# Patient Record
Sex: Female | Born: 1965 | Race: White | Hispanic: No | Marital: Single | Smoking: Former smoker
Health system: Southern US, Community
[De-identification: ages and names within clinical notes are randomized; demographics above are authoritative.]

## PROBLEM LIST (undated history)

## (undated) DIAGNOSIS — F1910 Other psychoactive substance abuse, uncomplicated: Secondary | ICD-10-CM

## (undated) DIAGNOSIS — F609 Personality disorder, unspecified: Secondary | ICD-10-CM

## (undated) DIAGNOSIS — R112 Nausea with vomiting, unspecified: Secondary | ICD-10-CM

## (undated) DIAGNOSIS — S32009K Unspecified fracture of unspecified lumbar vertebra, subsequent encounter for fracture with nonunion: Secondary | ICD-10-CM

## (undated) DIAGNOSIS — F319 Bipolar disorder, unspecified: Secondary | ICD-10-CM

## (undated) DIAGNOSIS — E785 Hyperlipidemia, unspecified: Secondary | ICD-10-CM

## (undated) DIAGNOSIS — M1990 Unspecified osteoarthritis, unspecified site: Secondary | ICD-10-CM

## (undated) DIAGNOSIS — Z9289 Personal history of other medical treatment: Secondary | ICD-10-CM

## (undated) DIAGNOSIS — J45909 Unspecified asthma, uncomplicated: Secondary | ICD-10-CM

## (undated) DIAGNOSIS — F419 Anxiety disorder, unspecified: Secondary | ICD-10-CM

## (undated) DIAGNOSIS — T753XXA Motion sickness, initial encounter: Secondary | ICD-10-CM

## (undated) DIAGNOSIS — M7130 Other bursal cyst, unspecified site: Secondary | ICD-10-CM

## (undated) DIAGNOSIS — F32A Depression, unspecified: Secondary | ICD-10-CM

## (undated) DIAGNOSIS — K219 Gastro-esophageal reflux disease without esophagitis: Secondary | ICD-10-CM

## (undated) DIAGNOSIS — G4733 Obstructive sleep apnea (adult) (pediatric): Secondary | ICD-10-CM

## (undated) DIAGNOSIS — F4310 Post-traumatic stress disorder, unspecified: Secondary | ICD-10-CM

## (undated) DIAGNOSIS — Z87898 Personal history of other specified conditions: Secondary | ICD-10-CM

## (undated) DIAGNOSIS — Z9889 Other specified postprocedural states: Secondary | ICD-10-CM

## (undated) DIAGNOSIS — F329 Major depressive disorder, single episode, unspecified: Secondary | ICD-10-CM

## (undated) HISTORY — DX: Depression, unspecified: F32.A

## (undated) HISTORY — DX: Unspecified osteoarthritis, unspecified site: M19.90

## (undated) HISTORY — DX: Major depressive disorder, single episode, unspecified: F32.9

## (undated) HISTORY — DX: Hyperlipidemia, unspecified: E78.5

## (undated) HISTORY — DX: Personality disorder, unspecified: F60.9

## (undated) HISTORY — DX: Post-traumatic stress disorder, unspecified: F43.10

## (undated) HISTORY — PX: KNEE SURGERY: SHX244

## (undated) HISTORY — DX: Obstructive sleep apnea (adult) (pediatric): G47.33

## (undated) HISTORY — DX: Other psychoactive substance abuse, uncomplicated: F19.10

## (undated) HISTORY — DX: Personal history of other specified conditions: Z87.898

## (undated) HISTORY — PX: TOE SURGERY: SHX1073

## (undated) HISTORY — PX: SPINE SURGERY: SHX786

## (undated) HISTORY — DX: Anxiety disorder, unspecified: F41.9

## (undated) HISTORY — PX: SHOULDER SURGERY: SHX246

---

## 1997-05-18 ENCOUNTER — Other Ambulatory Visit: Admission: RE | Admit: 1997-05-18 | Discharge: 1997-05-18 | Payer: Self-pay | Admitting: Obstetrics and Gynecology

## 1997-07-02 ENCOUNTER — Emergency Department (HOSPITAL_COMMUNITY): Admission: EM | Admit: 1997-07-02 | Discharge: 1997-07-02 | Payer: Self-pay | Admitting: Emergency Medicine

## 1999-09-23 ENCOUNTER — Emergency Department (HOSPITAL_COMMUNITY): Admission: EM | Admit: 1999-09-23 | Discharge: 1999-09-23 | Payer: Self-pay | Admitting: Emergency Medicine

## 1999-09-23 ENCOUNTER — Encounter: Payer: Self-pay | Admitting: Emergency Medicine

## 2000-09-19 ENCOUNTER — Encounter: Admission: RE | Admit: 2000-09-19 | Discharge: 2000-09-19 | Payer: Self-pay | Admitting: Endocrinology

## 2000-09-19 ENCOUNTER — Encounter: Payer: Self-pay | Admitting: Endocrinology

## 2003-08-20 ENCOUNTER — Emergency Department (HOSPITAL_COMMUNITY): Admission: EM | Admit: 2003-08-20 | Discharge: 2003-08-20 | Payer: Self-pay | Admitting: Emergency Medicine

## 2004-04-08 ENCOUNTER — Inpatient Hospital Stay (HOSPITAL_COMMUNITY): Admission: RE | Admit: 2004-04-08 | Discharge: 2004-04-12 | Payer: Self-pay | Admitting: Psychiatry

## 2004-04-08 ENCOUNTER — Ambulatory Visit: Payer: Self-pay | Admitting: Psychiatry

## 2004-04-10 ENCOUNTER — Encounter (HOSPITAL_COMMUNITY): Payer: Self-pay | Admitting: Psychiatry

## 2004-05-20 ENCOUNTER — Encounter: Admission: RE | Admit: 2004-05-20 | Discharge: 2004-05-20 | Payer: Self-pay | Admitting: Internal Medicine

## 2005-05-10 ENCOUNTER — Emergency Department: Payer: Self-pay | Admitting: General Practice

## 2005-07-03 ENCOUNTER — Encounter: Admission: RE | Admit: 2005-07-03 | Discharge: 2005-07-03 | Payer: Self-pay | Admitting: Internal Medicine

## 2006-10-23 ENCOUNTER — Encounter: Admission: RE | Admit: 2006-10-23 | Discharge: 2006-10-23 | Payer: Self-pay | Admitting: Internal Medicine

## 2007-10-25 ENCOUNTER — Encounter: Admission: RE | Admit: 2007-10-25 | Discharge: 2007-10-25 | Payer: Self-pay | Admitting: Internal Medicine

## 2008-03-11 ENCOUNTER — Emergency Department: Payer: Self-pay

## 2008-11-15 ENCOUNTER — Encounter: Admission: RE | Admit: 2008-11-15 | Discharge: 2008-11-15 | Payer: Self-pay | Admitting: Internal Medicine

## 2008-12-05 ENCOUNTER — Encounter: Payer: Self-pay | Admitting: Neonatal-Perinatal Medicine

## 2009-01-01 ENCOUNTER — Encounter: Payer: Self-pay | Admitting: Neonatal-Perinatal Medicine
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
## 2014-02-20 ENCOUNTER — Other Ambulatory Visit: Payer: Self-pay | Admitting: Gynecology

## 2014-02-20 DIAGNOSIS — R928 Other abnormal and inconclusive findings on diagnostic imaging of breast: Secondary | ICD-10-CM

## 2014-03-06 ENCOUNTER — Ambulatory Visit
Admission: RE | Admit: 2014-03-06 | Discharge: 2014-03-06 | Disposition: A | Discharge status: Home / Self Care | Payer: Self-pay | Source: Ambulatory Visit | Attending: Gynecology | Admitting: Gynecology

## 2014-03-06 DIAGNOSIS — R928 Other abnormal and inconclusive findings on diagnostic imaging of breast: Secondary | ICD-10-CM | POA: Diagnosis not present

## 2014-03-20 DIAGNOSIS — F3181 Bipolar II disorder: Secondary | ICD-10-CM | POA: Diagnosis not present

## 2014-04-17 DIAGNOSIS — M25561 Pain in right knee: Secondary | ICD-10-CM | POA: Diagnosis not present

## 2014-04-17 DIAGNOSIS — Z79891 Long term (current) use of opiate analgesic: Secondary | ICD-10-CM | POA: Diagnosis not present

## 2014-04-17 DIAGNOSIS — M25562 Pain in left knee: Secondary | ICD-10-CM | POA: Diagnosis not present

## 2014-06-12 DIAGNOSIS — Z79891 Long term (current) use of opiate analgesic: Secondary | ICD-10-CM | POA: Diagnosis not present

## 2014-06-12 DIAGNOSIS — M25562 Pain in left knee: Secondary | ICD-10-CM | POA: Diagnosis not present

## 2014-06-12 DIAGNOSIS — M25561 Pain in right knee: Secondary | ICD-10-CM | POA: Diagnosis not present

## 2014-08-04 ENCOUNTER — Other Ambulatory Visit: Payer: Self-pay | Admitting: Gynecology

## 2014-08-04 DIAGNOSIS — N6002 Solitary cyst of left breast: Secondary | ICD-10-CM

## 2014-09-06 ENCOUNTER — Other Ambulatory Visit: Payer: Self-pay

## 2014-09-12 ENCOUNTER — Ambulatory Visit (INDEPENDENT_AMBULATORY_CARE_PROVIDER_SITE_OTHER): Payer: Medicare Other | Admitting: Internal Medicine

## 2014-09-12 ENCOUNTER — Encounter: Payer: Self-pay | Admitting: Internal Medicine

## 2014-09-12 VITALS — BP 132/84 | HR 75 | Temp 98.4°F | Ht 62.5 in | Wt 137.0 lb

## 2014-09-12 DIAGNOSIS — F319 Bipolar disorder, unspecified: Secondary | ICD-10-CM

## 2014-09-12 DIAGNOSIS — E785 Hyperlipidemia, unspecified: Secondary | ICD-10-CM | POA: Diagnosis not present

## 2014-09-12 DIAGNOSIS — F329 Major depressive disorder, single episode, unspecified: Secondary | ICD-10-CM | POA: Diagnosis not present

## 2014-09-12 DIAGNOSIS — F3131 Bipolar disorder, current episode depressed, mild: Secondary | ICD-10-CM | POA: Insufficient documentation

## 2014-09-12 DIAGNOSIS — M542 Cervicalgia: Secondary | ICD-10-CM | POA: Diagnosis not present

## 2014-09-12 DIAGNOSIS — F32A Depression, unspecified: Secondary | ICD-10-CM | POA: Insufficient documentation

## 2014-09-12 DIAGNOSIS — F3175 Bipolar disorder, in partial remission, most recent episode depressed: Secondary | ICD-10-CM | POA: Insufficient documentation

## 2014-09-12 LAB — COMPREHENSIVE METABOLIC PANEL
ALBUMIN: 4.2 g/dL (ref 3.5–5.2)
ALK PHOS: 75 U/L (ref 39–117)
ALT: 8 U/L (ref 0–35)
AST: 15 U/L (ref 0–37)
BILIRUBIN TOTAL: 0.5 mg/dL (ref 0.2–1.2)
BUN: 8 mg/dL (ref 6–23)
CALCIUM: 9.7 mg/dL (ref 8.4–10.5)
CO2: 28 mEq/L (ref 19–32)
Chloride: 98 mEq/L (ref 96–112)
Creatinine, Ser: 0.78 mg/dL (ref 0.40–1.20)
GFR: 83.15 mL/min (ref >60.00)
GLUCOSE: 93 mg/dL (ref 70–99)
Potassium: 4.2 mEq/L (ref 3.5–5.1)
SODIUM: 133 meq/L — AB (ref 135–145)
TOTAL PROTEIN: 7.2 g/dL (ref 6.0–8.3)

## 2014-09-12 LAB — CBC
HEMATOCRIT: 42.2 % (ref 36.0–46.0)
Hemoglobin: 14.2 g/dL (ref 12.0–15.0)
MCHC: 33.7 g/dL (ref 30.0–36.0)
MCV: 96 fl (ref 78.0–100.0)
PLATELETS: 409 10*3/uL — AB (ref 150.0–400.0)
RBC: 4.4 Mil/uL (ref 3.87–5.11)
RDW: 15.7 % — AB (ref 11.5–15.5)
WBC: 6.4 10*3/uL (ref 4.0–10.5)

## 2014-09-12 LAB — LIPID PANEL
CHOL/HDL RATIO: 4
Cholesterol: 201 mg/dL — ABNORMAL HIGH (ref 0–200)
HDL: 45 mg/dL (ref >39.00)
LDL Cholesterol: 121 mg/dL — ABNORMAL HIGH (ref 0–99)
NonHDL: 156
Triglycerides: 174 mg/dL — ABNORMAL HIGH (ref 0.0–149.0)
VLDL: 34.8 mg/dL (ref 0.0–40.0)

## 2014-09-12 MED ORDER — SIMVASTATIN 20 MG PO TABS: 20.0000 mg | ORAL_TABLET | Freq: Every day | ORAL | Status: DC

## 2014-09-12 MED ORDER — BUPROPION HCL ER (XL) 300 MG PO TB24: 300.0000 mg | ORAL_TABLET | Freq: Every day | ORAL | Status: DC

## 2014-09-12 NOTE — Patient Instructions (Signed)
Fat and Cholesterol Control Diet Fat and cholesterol levels in your blood and organs are influenced by your diet. High levels of fat and cholesterol may lead to diseases of the heart, small and large blood vessels, gallbladder, liver, and pancreas. CONTROLLING FAT AND CHOLESTEROL WITH DIET Although exercise and lifestyle factors are important, your diet is key. That is because certain foods are known to raise cholesterol and others to lower it. The goal is to balance foods for their effect on cholesterol and more importantly, to replace saturated and trans fat with other types of fat, such as monounsaturated fat, polyunsaturated fat, and omega-3 fatty acids. On average, a person should consume no more than 15 to 17 g of saturated fat daily. Saturated and trans fats are considered "bad" fats, and they will raise LDL cholesterol. Saturated fats are primarily found in animal products such as meats, butter, and cream. However, that does not mean you need to give up all your favorite foods. Today, there are good tasting, low-fat, low-cholesterol substitutes for most of the things you like to eat. Choose low-fat or nonfat alternatives. Choose round or loin cuts of red meat. These types of cuts are lowest in fat and cholesterol. Chicken (without the skin), fish, veal, and ground turkey breast are great choices. Eliminate fatty meats, such as hot dogs and salami. Even shellfish have little or no saturated fat. Have a 3 oz (85 g) portion when you eat lean meat, poultry, or fish. Trans fats are also called "partially hydrogenated oils." They are oils that have been scientifically manipulated so that they are solid at room temperature resulting in a longer shelf life and improved taste and texture of foods in which they are added. Trans fats are found in stick margarine, some tub margarines, cookies, crackers, and baked goods.  When baking and cooking, oils are a great substitute for butter. The monounsaturated oils are  especially beneficial since it is believed they lower LDL and raise HDL. The oils you should avoid entirely are saturated tropical oils, such as coconut and palm.  Remember to eat a lot from food groups that are naturally free of saturated and trans fat, including fish, fruit, vegetables, beans, grains (barley, rice, couscous, bulgur wheat), and pasta (without cream sauces).  IDENTIFYING FOODS THAT LOWER FAT AND CHOLESTEROL  Soluble fiber may lower your cholesterol. This type of fiber is found in fruits such as apples, vegetables such as broccoli, potatoes, and carrots, legumes such as beans, peas, and lentils, and grains such as barley. Foods fortified with plant sterols (phytosterol) may also lower cholesterol. You should eat at least 2 g per day of these foods for a cholesterol lowering effect.  Read package labels to identify low-saturated fats, trans fat free, and low-fat foods at the supermarket. Select cheeses that have only 2 to 3 g saturated fat per ounce. Use a heart-healthy tub margarine that is free of trans fats or partially hydrogenated oil. When buying baked goods (cookies, crackers), avoid partially hydrogenated oils. Breads and muffins should be made from whole grains (whole-wheat or whole oat flour, instead of "flour" or "enriched flour"). Buy non-creamy canned soups with reduced salt and no added fats.  FOOD PREPARATION TECHNIQUES  Never deep-fry. If you must fry, either stir-fry, which uses very little fat, or use non-stick cooking sprays. When possible, broil, bake, or roast meats, and steam vegetables. Instead of putting butter or margarine on vegetables, use lemon and herbs, applesauce, and cinnamon (for squash and sweet potatoes). Use nonfat   yogurt, salsa, and low-fat dressings for salads.  LOW-SATURATED FAT / LOW-FAT FOOD SUBSTITUTES Meats / Saturated Fat (g)  Avoid: Steak, marbled (3 oz/85 g) / 11 g  Choose: Steak, lean (3 oz/85 g) / 4 g  Avoid: Hamburger (3 oz/85 g) / 7  g  Choose: Hamburger, lean (3 oz/85 g) / 5 g  Avoid: Ham (3 oz/85 g) / 6 g  Choose: Ham, lean cut (3 oz/85 g) / 2.4 g  Avoid: Chicken, with skin, dark meat (3 oz/85 g) / 4 g  Choose: Chicken, skin removed, dark meat (3 oz/85 g) / 2 g  Avoid: Chicken, with skin, light meat (3 oz/85 g) / 2.5 g  Choose: Chicken, skin removed, light meat (3 oz/85 g) / 1 g Dairy / Saturated Fat (g)  Avoid: Whole milk (1 cup) / 5 g  Choose: Low-fat milk, 2% (1 cup) / 3 g  Choose: Low-fat milk, 1% (1 cup) / 1.5 g  Choose: Skim milk (1 cup) / 0.3 g  Avoid: Hard cheese (1 oz/28 g) / 6 g  Choose: Skim milk cheese (1 oz/28 g) / 2 to 3 g  Avoid: Cottage cheese, 4% fat (1 cup) / 6.5 g  Choose: Low-fat cottage cheese, 1% fat (1 cup) / 1.5 g  Avoid: Ice cream (1 cup) / 9 g  Choose: Sherbet (1 cup) / 2.5 g  Choose: Nonfat frozen yogurt (1 cup) / 0.3 g  Choose: Frozen fruit bar / trace  Avoid: Whipped cream (1 tbs) / 3.5 g  Choose: Nondairy whipped topping (1 tbs) / 1 g Condiments / Saturated Fat (g)  Avoid: Mayonnaise (1 tbs) / 2 g  Choose: Low-fat mayonnaise (1 tbs) / 1 g  Avoid: Butter (1 tbs) / 7 g  Choose: Extra light margarine (1 tbs) / 1 g  Avoid: Coconut oil (1 tbs) / 11.8 g  Choose: Olive oil (1 tbs) / 1.8 g  Choose: Corn oil (1 tbs) / 1.7 g  Choose: Safflower oil (1 tbs) / 1.2 g  Choose: Sunflower oil (1 tbs) / 1.4 g  Choose: Soybean oil (1 tbs) / 2.4 g  Choose: Canola oil (1 tbs) / 1 g Document Released: 02/17/2005 Document Revised: 06/14/2012 Document Reviewed: 05/18/2013 Exit[employer] Patient Information 2015 [employer]. This information is not intended to replace advice given to you by your health care provider. Make sure you discuss any questions you have with your health care provider.  

## 2014-09-12 NOTE — Assessment & Plan Note (Signed)
Controlled on Paxil and Wellbutrin Wellbutrin refilled today Will check CBC and CMET

## 2014-09-12 NOTE — Assessment & Plan Note (Signed)
Will check CMET and Lipid profile today Zocor refilled Encouraged her to consume a low fat diet

## 2014-09-12 NOTE — Assessment & Plan Note (Signed)
Advised her to go ahead and make an appt with her mental health specialist Advised that I can not refill her Lorayne Bender

## 2014-09-12 NOTE — Progress Notes (Signed)
Pre visit review using our clinic review tool, if applicable. No additional management support is needed unless otherwise documented below in the visit note. 

## 2014-09-12 NOTE — Assessment & Plan Note (Signed)
Continue following with your orthopedist Will defer pain medication refills to him

## 2014-09-12 NOTE — Progress Notes (Signed)
HPI  Pt presents to the clinic today to establish care and for management of the conditions listed below. She is transferring care from [facility] in [place].  Depression: Chronic but stable on Wellbutrin and Paxil. Denies SI/HI  Bipolar: She is taking [place]. She reports she has been on this for at least 10 years. She sees a Armed forces operational officer in [place] but reports she can not remember the name. She will be out of her medication shortly and does request a refill today.  Chronic neck pain: She takes Lodine, Neurontin  and Norco as needed. She goes to [facility] in [place]. They have done xrays and MRI's but she is not sure what they showed.  HLD: She takes Zocor daily, she denies muscle pain. She has been out of her medication for the last 2 months. She does try to consume a low fat diet.  Flu: 11/2013 Tetanus: > 10 years ago LMP: postmenopausal Pap Smear: 01/2014 normal done at [facility]: 03/2014, spot on the left breast, awaiting evaluation with ultrasound Vision Screening: as needed Dentist: as needed  Past Medical History  Diagnosis Date  . Hyperlipidemia   . Depression   . Personality disorder   . Post traumatic stress disorder (PTSD)   . History of lump of left breast     Current Outpatient Prescriptions  Medication Sig Dispense Refill  . buPROPion (WELLBUTRIN XL) 300 MG 24 hr tablet Take 300 mg by mouth daily.     Marland Kitchen etodolac (LODINE) 400 MG tablet Take 400 mg by mouth 2 (two) times daily.     Marland Kitchen gabapentin (NEURONTIN) 100 MG capsule Take 100 mg by mouth 3 (three) times daily as needed.     Marland Kitchen HYDROcodone-acetaminophen (NORCO) 7.5-325 MG per tablet Take 1 tablet by mouth 3 (three) times daily.     . Paliperidone (INVEGA PO) Take 1 capsule by mouth daily.    Marland Kitchen PARoxetine (PAXIL) 40 MG tablet Take 40 mg by mouth every morning.     . simvastatin (ZOCOR) 20 MG tablet Take 20 mg by mouth daily.     No current facility-administered  medications for this visit.    No Known Allergies  Family History  Problem Relation Age of Onset  . Heart disease Father   . Hyperlipidemia Father   . Alcohol abuse Brother   . Alcohol abuse Paternal Uncle     History   Social History  . Marital Status: Single    Spouse Name: N/A  . Number of Children: N/A  . Years of Education: N/A   Occupational History  . Not on file.   Social History Main Topics  . Smoking status: Current Every Day Smoker -- 1.00 packs/day    Types: Cigarettes  . Smokeless tobacco: Never Used  . Alcohol Use: No     Comment: quit 20 years  . Drug Use: Yes    Special: Marijuana  . Sexual Activity: Not on file   Other Topics Concern  . Not on file   Social History Narrative  . No narrative on file    ROS:  Constitutional: Denies fever, malaise, fatigue, headache or abrupt weight changes.  HEENT: Denies eye pain, eye redness, ear pain, ringing in the ears, wax buildup, runny nose, nasal congestion, bloody nose, or sore throat. Respiratory: Denies difficulty breathing, shortness of breath, cough or sputum production.   Cardiovascular: Denies chest pain, chest tightness, palpitations or swelling in the hands or feet.  Gastrointestinal: Denies abdominal  pain, bloating, constipation, diarrhea or blood in the stool.  GU: Denies frequency, urgency, pain with urination, blood in urine, odor or discharge. Musculoskeletal: Pt reports joint pain. Denies decrease in range of motion, difficulty with gait, muscle pain or joint swelling.  Skin: Denies redness, rashes, lesions or ulcercations.  Neurological: Denies dizziness, difficulty with memory, difficulty with speech or problems with balance and coordination.  Psych: Pt reports depression. Denies anxiety, SI/HI.  No other specific complaints in a complete review of systems (except as listed in HPI above).  PE:  BP 132/84 mmHg  Pulse 75  Temp(Src) 98.4 F (36.9 C) (Oral)  Ht 5' 2.5" (1.588 m)  Wt  137 lb (62.143 kg)  BMI 24.64 kg/m2  SpO2 98%  Wt Readings from Last 3 Encounters:  09/12/14 137 lb (62.143 kg)    General: Appears her stated age, well developed, well nourished in NAD. HEENT: Head: normal shape and size; Eyes: sclera white, no icterus, conjunctiva pink, PERRLA and EOMs intact;  Cardiovascular: Normal rate and rhythm. S1,S2 noted.  No murmur, rubs or gallops noted.  Pulmonary/Chest: Normal effort and positive vesicular breath sounds. No respiratory distress. No wheezes, rales or ronchi noted.  Musculoskeletal: Normal flexion, extension and rotation of the cervical spine. Pain with palpation over the cervical spine. No signs of joint swelling. No difficulty with gait.  Neurological: Alert and oriented. Psychiatric: She is very fidgety today. She is making eye contact. Judgement and thought content seem normal.  Assessment and Plan:

## 2014-10-02 DIAGNOSIS — M542 Cervicalgia: Secondary | ICD-10-CM | POA: Diagnosis not present

## 2014-10-02 DIAGNOSIS — G894 Chronic pain syndrome: Secondary | ICD-10-CM | POA: Diagnosis not present

## 2014-10-02 DIAGNOSIS — M791 Myalgia: Secondary | ICD-10-CM | POA: Diagnosis not present

## 2014-10-05 DIAGNOSIS — F3181 Bipolar II disorder: Secondary | ICD-10-CM | POA: Diagnosis not present

## 2014-10-06 ENCOUNTER — Ambulatory Visit
Admission: RE | Admit: 2014-10-06 | Discharge: 2014-10-06 | Disposition: A | Discharge status: Home / Self Care | Payer: Medicare Other | Source: Ambulatory Visit | Attending: Gynecology | Admitting: Gynecology

## 2014-10-06 DIAGNOSIS — N6002 Solitary cyst of left breast: Secondary | ICD-10-CM

## 2014-10-06 DIAGNOSIS — N63 Unspecified lump in breast: Secondary | ICD-10-CM | POA: Diagnosis not present

## 2014-11-27 DIAGNOSIS — G894 Chronic pain syndrome: Secondary | ICD-10-CM | POA: Diagnosis not present

## 2014-11-27 DIAGNOSIS — M791 Myalgia: Secondary | ICD-10-CM | POA: Diagnosis not present

## 2014-11-27 DIAGNOSIS — M542 Cervicalgia: Secondary | ICD-10-CM | POA: Diagnosis not present

## 2015-01-18 ENCOUNTER — Other Ambulatory Visit: Payer: Self-pay | Admitting: Obstetrics and Gynecology

## 2015-01-18 DIAGNOSIS — N6320 Unspecified lump in the left breast, unspecified quadrant: Secondary | ICD-10-CM

## 2015-01-22 DIAGNOSIS — M791 Myalgia: Secondary | ICD-10-CM | POA: Diagnosis not present

## 2015-01-22 DIAGNOSIS — G894 Chronic pain syndrome: Secondary | ICD-10-CM | POA: Diagnosis not present

## 2015-01-22 DIAGNOSIS — M542 Cervicalgia: Secondary | ICD-10-CM | POA: Diagnosis not present

## 2015-02-02 ENCOUNTER — Ambulatory Visit
Admission: RE | Admit: 2015-02-02 | Discharge: 2015-02-02 | Disposition: A | Discharge status: Home / Self Care | Payer: Medicare Other | Source: Ambulatory Visit | Attending: Obstetrics and Gynecology | Admitting: Obstetrics and Gynecology

## 2015-02-02 DIAGNOSIS — N6320 Unspecified lump in the left breast, unspecified quadrant: Secondary | ICD-10-CM

## 2015-02-02 DIAGNOSIS — N63 Unspecified lump in breast: Secondary | ICD-10-CM | POA: Diagnosis not present

## 2015-02-16 DIAGNOSIS — Z130 Encounter for screening for diseases of the blood and blood-forming organs and certain disorders involving the immune mechanism: Secondary | ICD-10-CM | POA: Diagnosis not present

## 2015-02-16 DIAGNOSIS — Z1389 Encounter for screening for other disorder: Secondary | ICD-10-CM | POA: Diagnosis not present

## 2015-02-16 DIAGNOSIS — Z6826 Body mass index (BMI) 26.0-26.9, adult: Secondary | ICD-10-CM | POA: Diagnosis not present

## 2015-02-16 DIAGNOSIS — Z01419 Encounter for gynecological examination (general) (routine) without abnormal findings: Secondary | ICD-10-CM | POA: Diagnosis not present

## 2015-02-16 DIAGNOSIS — N952 Postmenopausal atrophic vaginitis: Secondary | ICD-10-CM | POA: Diagnosis not present

## 2015-02-28 ENCOUNTER — Other Ambulatory Visit: Payer: Self-pay | Admitting: Internal Medicine

## 2015-03-09 ENCOUNTER — Other Ambulatory Visit: Payer: Self-pay

## 2015-03-15 ENCOUNTER — Encounter: Payer: Self-pay | Admitting: Internal Medicine

## 2015-03-15 ENCOUNTER — Ambulatory Visit (INDEPENDENT_AMBULATORY_CARE_PROVIDER_SITE_OTHER): Payer: Medicare Other | Admitting: Internal Medicine

## 2015-03-15 VITALS — BP 128/88 | HR 108 | Temp 97.9°F | Ht 62.0 in | Wt 134.0 lb

## 2015-03-15 DIAGNOSIS — E785 Hyperlipidemia, unspecified: Secondary | ICD-10-CM

## 2015-03-15 DIAGNOSIS — M542 Cervicalgia: Secondary | ICD-10-CM

## 2015-03-15 DIAGNOSIS — H9193 Unspecified hearing loss, bilateral: Secondary | ICD-10-CM

## 2015-03-15 DIAGNOSIS — Z0000 Encounter for general adult medical examination without abnormal findings: Secondary | ICD-10-CM | POA: Diagnosis not present

## 2015-03-15 DIAGNOSIS — F329 Major depressive disorder, single episode, unspecified: Secondary | ICD-10-CM

## 2015-03-15 DIAGNOSIS — Z1211 Encounter for screening for malignant neoplasm of colon: Secondary | ICD-10-CM

## 2015-03-15 DIAGNOSIS — E875 Hyperkalemia: Secondary | ICD-10-CM

## 2015-03-15 DIAGNOSIS — F32A Depression, unspecified: Secondary | ICD-10-CM

## 2015-03-15 DIAGNOSIS — F3170 Bipolar disorder, currently in remission, most recent episode unspecified: Secondary | ICD-10-CM

## 2015-03-15 LAB — COMPREHENSIVE METABOLIC PANEL
ALT: 10 U/L (ref 0–35)
AST: 14 U/L (ref 0–37)
Albumin: 4.4 g/dL (ref 3.5–5.2)
Alkaline Phosphatase: 63 U/L (ref 39–117)
BUN: 10 mg/dL (ref 6–23)
CHLORIDE: 100 meq/L (ref 96–112)
CO2: 31 meq/L (ref 19–32)
CREATININE: 1.05 mg/dL (ref 0.40–1.20)
Calcium: 9.9 mg/dL (ref 8.4–10.5)
GFR: 58.89 mL/min — ABNORMAL LOW (ref >60.00)
Glucose, Bld: 94 mg/dL (ref 70–99)
POTASSIUM: 5.4 meq/L — AB (ref 3.5–5.1)
SODIUM: 133 meq/L — AB (ref 135–145)
Total Bilirubin: 0.4 mg/dL (ref 0.2–1.2)
Total Protein: 7 g/dL (ref 6.0–8.3)

## 2015-03-15 LAB — CBC
HEMATOCRIT: 41.3 % (ref 36.0–46.0)
HEMOGLOBIN: 13.8 g/dL (ref 12.0–15.0)
MCHC: 33.3 g/dL (ref 30.0–36.0)
MCV: 94.3 fl (ref 78.0–100.0)
PLATELETS: 399 10*3/uL (ref 150.0–400.0)
RBC: 4.38 Mil/uL (ref 3.87–5.11)
RDW: 14.5 % (ref 11.5–15.5)
WBC: 8.8 10*3/uL (ref 4.0–10.5)

## 2015-03-15 LAB — TSH: TSH: 1.3 u[IU]/mL (ref 0.35–4.50)

## 2015-03-15 LAB — LIPID PANEL
CHOL/HDL RATIO: 3
Cholesterol: 169 mg/dL (ref 0–200)
HDL: 54.3 mg/dL (ref >39.00)
LDL CALC: 93 mg/dL (ref 0–99)
NonHDL: 114.48
Triglycerides: 105 mg/dL (ref 0.0–149.0)
VLDL: 21 mg/dL (ref 0.0–40.0)

## 2015-03-15 NOTE — Patient Instructions (Signed)
Health Maintenance, Female Adopting a healthy lifestyle and getting preventive care can go a long way to promote health and wellness. Talk with your health care provider about what schedule of regular examinations is right for you. This is a good chance for you to check in with your provider about disease prevention and staying healthy. In between checkups, there are plenty of things you can do on your own. Experts have done a lot of research about which lifestyle changes and preventive measures are most likely to keep you healthy. Ask your health care provider for more information. WEIGHT AND DIET  Eat a healthy diet  Be sure to include plenty of vegetables, fruits, low-fat dairy products, and lean protein.  Do not eat a lot of foods high in solid fats, added sugars, or salt.  Get regular exercise. This is one of the most important things you can do for your health.  Most adults should exercise for at least 150 minutes each week. The exercise should increase your heart rate and make you sweat (moderate-intensity exercise).  Most adults should also do strengthening exercises at least twice a week. This is in addition to the moderate-intensity exercise.  Maintain a healthy weight  Body mass index (BMI) is a measurement that can be used to identify possible weight problems. It estimates body fat based on height and weight. Your health care provider can help determine your BMI and help you achieve or maintain a healthy weight.  For females 20 years of age and older:   A BMI below 18.5 is considered underweight.  A BMI of 18.5 to 24.9 is normal.  A BMI of 25 to 29.9 is considered overweight.  A BMI of 30 and above is considered obese.  Watch levels of cholesterol and blood lipids  You should start having your blood tested for lipids and cholesterol at 51 years of age, then have this test every 5 years.  You may need to have your cholesterol levels checked more often if:  Your lipid  or cholesterol levels are high.  You are older than 50 years of age.  You are at high risk for heart disease.  CANCER SCREENING   Lung Cancer  Lung cancer screening is recommended for adults 55-80 years old who are at high risk for lung cancer because of a history of smoking.  A yearly low-dose CT scan of the lungs is recommended for people who:  Currently smoke.  Have quit within the past 15 years.  Have at least a 30-pack-year history of smoking. A pack year is smoking an average of one pack of cigarettes a day for 1 year.  Yearly screening should continue until it has been 15 years since you quit.  Yearly screening should stop if you develop a health problem that would prevent you from having lung cancer treatment.  Breast Cancer  Practice breast self-awareness. This means understanding how your breasts normally appear and feel.  It also means doing regular breast self-exams. Let your health care provider know about any changes, no matter how small.  If you are in your 20s or 30s, you should have a clinical breast exam (CBE) by a health care provider every 1-3 years as part of a regular health exam.  If you are 40 or older, have a CBE every year. Also consider having a breast X-ray (mammogram) every year.  If you have a family history of breast cancer, talk to your health care provider about genetic screening.  If you   are at high risk for breast cancer, talk to your health care provider about having an MRI and a mammogram every year.  Breast cancer gene (BRCA) assessment is recommended for women who have family members with BRCA-related cancers. BRCA-related cancers include:  Breast.  Ovarian.  Tubal.  Peritoneal cancers.  Results of the assessment will determine the need for genetic counseling and BRCA1 and BRCA2 testing. Cervical Cancer Your health care provider may recommend that you be screened regularly for cancer of the pelvic organs (ovaries, uterus, and  vagina). This screening involves a pelvic examination, including checking for microscopic changes to the surface of your cervix (Pap test). You may be encouraged to have this screening done every 3 years, beginning at age 21.  For women ages 30-65, health care providers may recommend pelvic exams and Pap testing every 3 years, or they may recommend the Pap and pelvic exam, combined with testing for human papilloma virus (HPV), every 5 years. Some types of HPV increase your risk of cervical cancer. Testing for HPV may also be done on women of any age with unclear Pap test results.  Other health care providers may not recommend any screening for nonpregnant women who are considered low risk for pelvic cancer and who do not have symptoms. Ask your health care provider if a screening pelvic exam is right for you.  If you have had past treatment for cervical cancer or a condition that could lead to cancer, you need Pap tests and screening for cancer for at least 20 years after your treatment. If Pap tests have been discontinued, your risk factors (such as having a new sexual partner) need to be reassessed to determine if screening should resume. Some women have medical problems that increase the chance of getting cervical cancer. In these cases, your health care provider may recommend more frequent screening and Pap tests. Colorectal Cancer  This type of cancer can be detected and often prevented.  Routine colorectal cancer screening usually begins at 50 years of age and continues through 51 years of age.  Your health care provider may recommend screening at an earlier age if you have risk factors for colon cancer.  Your health care provider may also recommend using home test kits to check for hidden blood in the stool.  A small camera at the end of a tube can be used to examine your colon directly (sigmoidoscopy or colonoscopy). This is done to check for the earliest forms of colorectal  cancer.  Routine screening usually begins at age 50.  Direct examination of the colon should be repeated every 5-10 years through 51 years of age. However, you may need to be screened more often if early forms of precancerous polyps or small growths are found. Skin Cancer  Check your skin from head to toe regularly.  Tell your health care provider about any new moles or changes in moles, especially if there is a change in a mole's shape or color.  Also tell your health care provider if you have a mole that is larger than the size of a pencil eraser.  Always use sunscreen. Apply sunscreen liberally and repeatedly throughout the day.  Protect yourself by wearing long sleeves, pants, a wide-brimmed hat, and sunglasses whenever you are outside. HEART DISEASE, DIABETES, AND HIGH BLOOD PRESSURE   High blood pressure causes heart disease and increases the risk of stroke. High blood pressure is more likely to develop in:  People who have blood pressure in the high end   of the normal range (130-139/85-89 mm Hg).  People who are overweight or obese.  People who are African American.  If you are 38-23 years of age, have your blood pressure checked every 3-5 years. If you are 61 years of age or older, have your blood pressure checked every year. You should have your blood pressure measured twice--once when you are at a hospital or clinic, and once when you are not at a hospital or clinic. Record the average of the two measurements. To check your blood pressure when you are not at a hospital or clinic, you can use:  An automated blood pressure machine at a pharmacy.  A home blood pressure monitor.  If you are between 45 years and 39 years old, ask your health care provider if you should take aspirin to prevent strokes.  Have regular diabetes screenings. This involves taking a blood sample to check your fasting blood sugar level.  If you are at a normal weight and have a low risk for diabetes,  have this test once every three years after 51 years of age.  If you are overweight and have a high risk for diabetes, consider being tested at a younger age or more often. PREVENTING INFECTION  Hepatitis B  If you have a higher risk for hepatitis B, you should be screened for this virus. You are considered at high risk for hepatitis B if:  You were born in a country where hepatitis B is common. Ask your health care provider which countries are considered high risk.  Your parents were born in a high-risk country, and you have not been immunized against hepatitis B (hepatitis B vaccine).  You have HIV or AIDS.  You use needles to inject street drugs.  You live with someone who has hepatitis B.  You have had sex with someone who has hepatitis B.  You get hemodialysis treatment.  You take certain medicines for conditions, including cancer, organ transplantation, and autoimmune conditions. Hepatitis C  Blood testing is recommended for:  Everyone born from [birth year] through 1966.  Anyone with known risk factors for hepatitis C. Sexually transmitted infections (STIs)  You should be screened for sexually transmitted infections (STIs) including gonorrhea and chlamydia if:  You are sexually active and are younger than 51 years of age.  You are older than 51 years of age and your health care provider tells you that you are at risk for this type of infection.  Your sexual activity has changed since you were last screened and you are at an increased risk for chlamydia or gonorrhea. Ask your health care provider if you are at risk.  If you do not have HIV, but are at risk, it may be recommended that you take a prescription medicine daily to prevent HIV infection. This is called pre-exposure prophylaxis (PrEP). You are considered at risk if:  You are sexually active and do not regularly use condoms or know the HIV status of your partner(s).  You take drugs by injection.  You are sexually  active with a partner who has HIV. Talk with your health care provider about whether you are at high risk of being infected with HIV. If you choose to begin PrEP, you should first be tested for HIV. You should then be tested every 3 months for as long as you are taking PrEP.  PREGNANCY   If you are premenopausal and you may become pregnant, ask your health care provider about preconception counseling.  If you may  become pregnant, take 400 to 800 micrograms (mcg) of folic acid every day.  If you want to prevent pregnancy, talk to your health care provider about birth control (contraception). OSTEOPOROSIS AND MENOPAUSE   Osteoporosis is a disease in which the bones lose minerals and strength with aging. This can result in serious bone fractures. Your risk for osteoporosis can be identified using a bone density scan.  If you are 61 years of age or older, or if you are at risk for osteoporosis and fractures, ask your health care provider if you should be screened.  Ask your health care provider whether you should take a calcium or vitamin D supplement to lower your risk for osteoporosis.  Menopause may have certain physical symptoms and risks.  Hormone replacement therapy may reduce some of these symptoms and risks. Talk to your health care provider about whether hormone replacement therapy is right for you.  HOME CARE INSTRUCTIONS   Schedule regular health, dental, and eye exams.  Stay current with your immunizations.   Do not use any tobacco products including cigarettes, chewing tobacco, or electronic cigarettes.  If you are pregnant, do not drink alcohol.  If you are breastfeeding, limit how much and how often you drink alcohol.  Limit alcohol intake to no more than 1 drink per day for nonpregnant women. One drink equals 12 ounces of beer, 5 ounces of wine, or 1 ounces of hard liquor.  Do not use street drugs.  Do not share needles.  Ask your health care provider for help if  you need support or information about quitting drugs.  Tell your health care provider if you often feel depressed.  Tell your health care provider if you have ever been abused or do not feel safe at home.   This information is not intended to replace advice given to you by your health care provider. Make sure you discuss any questions you have with your health care provider.   Document Released: 09/02/2010 Document Revised: 03/10/2014 Document Reviewed: 01/19/2013 Elsevier Interactive Patient Education [employer].

## 2015-03-15 NOTE — Assessment & Plan Note (Signed)
She will continue to follow with [facility]

## 2015-03-15 NOTE — Assessment & Plan Note (Signed)
Stable on Paxil and Wellbutrin She will continue to follow with [facility]

## 2015-03-15 NOTE — Progress Notes (Signed)
HPI:  Pt presents to the clinic today for her Medicare Wellness exam. She is also due for follow up of chronic conditions.  Depression: Chronic but stable on Wellbutrin and Paxil. Denies SI/HI  Bipolar: She is taking [place]. She reports she has been on this for at least 10 years. She follows with [facility] in [place].   Chronic neck pain: She takes Lodine, Neurontin  and Norco as needed. She goes to [facility] in [place]. They have done xrays and MRI's but she is not sure what they showed.  HLD: She takes Zocor daily, she denies muscle pain. She does try to consume a low fat diet.  Past Medical History  Diagnosis Date  . Hyperlipidemia   . Depression   . Personality disorder   . Post traumatic stress disorder (PTSD)   . History of lump of left breast     Current Outpatient Prescriptions  Medication Sig Dispense Refill  . buPROPion (WELLBUTRIN XL) 300 MG 24 hr tablet Take 1 tablet (300 mg total) by mouth daily. 90 tablet 1  . etodolac (LODINE) 400 MG tablet Take 400 mg by mouth 2 (two) times daily.     Marland Kitchen gabapentin (NEURONTIN) 100 MG capsule Take 100 mg by mouth 3 (three) times daily as needed.     Marland Kitchen HYDROcodone-acetaminophen (NORCO) 7.5-325 MG per tablet Take 1 tablet by mouth 3 (three) times daily.     . Paliperidone (INVEGA PO) Take 1 capsule by mouth daily.    Marland Kitchen PARoxetine (PAXIL) 40 MG tablet Take 40 mg by mouth every morning.     . simvastatin (ZOCOR) 20 MG tablet Take 1 tablet (20 mg total) by mouth daily. 90 tablet 1   No current facility-administered medications for this visit.    No Known Allergies  Family History  Problem Relation Age of Onset  . Heart disease Father   . Hyperlipidemia Father   . Alcohol abuse Brother   . Alcohol abuse Paternal Uncle     Social History   Social History  . Marital Status: Single    Spouse Name: N/A  . Number of Children: N/A  . Years of Education: N/A   Occupational History  . Not on file.   Social  History Main Topics  . Smoking status: Former Smoker -- 1.00 packs/day for 1 years    Types: Cigarettes  . Smokeless tobacco: Never Used  . Alcohol Use: No     Comment: quit 20 years  . Drug Use: Yes    Special: Marijuana     Comment: occasional  . Sexual Activity: Not Currently   Other Topics Concern  . Not on file   Social History Narrative    Hospitiliaztions: None  Health Maintenance:    Flu: 03/2014  Tetanus: > 10 years  Mammogram: 02/2015, abnormal, follow up in 6 months GI Breast  Pap Smear: 02/2015  Colon Screening: never  Eye Doctor: as needed  Dental Exam: as needed   Providers:   PCP: Webb Silversmith  Orthopedic: Dr. Albesa Seen  Psychiatrist: Beverly Sessions    I have personally reviewed and have noted:  1. The patient's medical and social history 2. Their use of alcohol, tobacco or illicit drugs 3. Their current medications and supplements 4. The patient's functional ability including ADL's, fall risks, home safety risks and  hearing or visual impairment. 5. Diet and physical activities 6. Evidence for depression or mood disorder  Subjective:   Review of Systems:   Constitutional: Denies fever, malaise,  fatigue, headache or abrupt weight changes.  HEENT: Pt reports hearing loss. Denies eye pain, eye redness, ear pain, ringing in the ears, wax buildup, runny nose, nasal congestion, bloody nose, or sore throat. Respiratory: Denies difficulty breathing, shortness of breath, cough or sputum production.   Cardiovascular: Denies chest pain, chest tightness, palpitations or swelling in the hands or feet.  Musculoskeletal: Pt reports chronic neck pain. Denies decrease in range of motion, difficulty with gait, muscle pain or joint swelling.  Skin: Denies redness, rashes, lesions or ulcercations.  Neurological: Denies dizziness, difficulty with memory, difficulty with speech or problems with balance and coordination.  Psych: Pt reports chronic depression. Denies anxiety,  SI/HI.  No other specific complaints in a complete review of systems (except as listed in HPI above).  Objective:  PE:   BP 128/88 mmHg  Pulse 108  Temp(Src) 97.9 F (36.6 C) (Oral)  Ht 5\' 2"  (1.575 m)  Wt 134 lb (60.782 kg)  BMI 24.50 kg/m2  SpO2 98% Wt Readings from Last 3 Encounters:  03/15/15 134 lb (60.782 kg)  09/12/14 137 lb (62.143 kg)    General: Appears her stated age, well developed, well nourished in NAD. HEENT: Head: normal shape and size; Eyes: sclera white, no icterus, conjunctiva pink, PERRLA and EOMs intact;   Cardiovascular: Tachycardic with normal rhythm. S1,S2 noted.  No murmur, rubs or gallops noted.   Pulmonary/Chest: Normal effort and positive vesicular breath sounds. No respiratory distress. No wheezes, rales or ronchi noted.  Musculoskeletal: Normal flexion, extension and rotation of the cervical spine. Pain with palpation over the cervical spine. No signs of joint swelling. No difficulty with gait.  Neurological: Alert and oriented. Abnormal Weber, lateralized to the right ear. Normal Rinne. Psychiatric: She is very fidgety today. She is making eye contact. Judgement and thought content seem normal.    BMET    Component Value Date/Time   NA 133* 09/12/2014 1146   K 4.2 09/12/2014 1146   CL 98 09/12/2014 1146   CO2 28 09/12/2014 1146   GLUCOSE 93 09/12/2014 1146   BUN 8 09/12/2014 1146   CREATININE 0.78 09/12/2014 1146   CALCIUM 9.7 09/12/2014 1146    Lipid Panel     Component Value Date/Time   CHOL 201* 09/12/2014 1146   TRIG 174.0* 09/12/2014 1146   HDL 45.00 09/12/2014 1146   CHOLHDL 4 09/12/2014 1146   VLDL 34.8 09/12/2014 1146   LDLCALC 121* 09/12/2014 1146    CBC    Component Value Date/Time   WBC 6.4 09/12/2014 1146   RBC 4.40 09/12/2014 1146   HGB 14.2 09/12/2014 1146   HCT 42.2 09/12/2014 1146   PLT 409.0* 09/12/2014 1146   MCV 96.0 09/12/2014 1146   MCHC 33.7 09/12/2014 1146   RDW 15.7* 09/12/2014 1146    Hgb  A1C No results found for: HGBA1C    Assessment and Plan:   Medicare Annual Wellness Visit:  Diet: She does not adhere to any specific diet, encouraged low fat diet Physical activity: Sedentary Depression/mood screen: Chronic Hearing: Trouble hearing soft noises Visual acuity: Grossly normal, performs annual eye exam  ADLs: Capable Fall risk: None Home safety: Good Cognitive evaluation: Intact to orientation, naming, recall and repetition EOL planning: No adv directives, full code/ I agree  Preventative Medicine: Flu and Tdap today. Mammogram and Pap Smear UTD. Referral placed to GI for screening colonoscopy. Encouraged her to see an eye doctor and dentist at least annually. Encouraged her to consume a balanced diet and start  an exercise regimen.   Next appointment: 6 month followup chronic conditions  Hearing Loss:  Will refer to audiology for further evaluation

## 2015-03-15 NOTE — Assessment & Plan Note (Signed)
Chronic Continue Lodine, Neurontin and Norco Continue to follow with [facility]

## 2015-03-15 NOTE — Assessment & Plan Note (Signed)
Will check CMET and Lipid profile today Encouraged her to consume a low fat diet Continue Zocor, will adjust dose as needed

## 2015-03-16 NOTE — Addendum Note (Signed)
Addended by: Lurlean Nanny on: 03/16/2015 04:53 PM   Modules accepted: Orders

## 2015-03-16 NOTE — Addendum Note (Signed)
Addended by: Marchia Bond on: 03/16/2015 01:42 PM   Modules accepted: Miquel Dunn

## 2015-03-19 ENCOUNTER — Encounter: Payer: Self-pay | Admitting: Gastroenterology

## 2015-03-19 DIAGNOSIS — G894 Chronic pain syndrome: Secondary | ICD-10-CM | POA: Diagnosis not present

## 2015-03-19 DIAGNOSIS — M791 Myalgia: Secondary | ICD-10-CM | POA: Diagnosis not present

## 2015-03-19 DIAGNOSIS — Z79891 Long term (current) use of opiate analgesic: Secondary | ICD-10-CM | POA: Diagnosis not present

## 2015-03-19 DIAGNOSIS — M542 Cervicalgia: Secondary | ICD-10-CM | POA: Diagnosis not present

## 2015-03-21 ENCOUNTER — Other Ambulatory Visit: Payer: Self-pay | Admitting: Pain Medicine

## 2015-03-21 DIAGNOSIS — M542 Cervicalgia: Secondary | ICD-10-CM

## 2015-03-22 ENCOUNTER — Ambulatory Visit
Admission: RE | Admit: 2015-03-22 | Discharge: 2015-03-22 | Disposition: A | Discharge status: Home / Self Care | Payer: Medicare Other | Source: Ambulatory Visit | Attending: Pain Medicine | Admitting: Pain Medicine

## 2015-03-22 DIAGNOSIS — M542 Cervicalgia: Secondary | ICD-10-CM

## 2015-03-22 DIAGNOSIS — M50221 Other cervical disc displacement at C4-C5 level: Secondary | ICD-10-CM | POA: Diagnosis not present

## 2015-03-26 DIAGNOSIS — H9011 Conductive hearing loss, unilateral, right ear, with unrestricted hearing on the contralateral side: Secondary | ICD-10-CM | POA: Diagnosis not present

## 2015-03-29 ENCOUNTER — Other Ambulatory Visit (INDEPENDENT_AMBULATORY_CARE_PROVIDER_SITE_OTHER): Payer: Medicare Other

## 2015-03-29 DIAGNOSIS — E875 Hyperkalemia: Secondary | ICD-10-CM

## 2015-03-29 LAB — POTASSIUM: POTASSIUM: 4.5 meq/L (ref 3.5–5.1)

## 2015-04-06 ENCOUNTER — Other Ambulatory Visit: Payer: Self-pay | Admitting: Internal Medicine

## 2015-04-06 DIAGNOSIS — F3181 Bipolar II disorder: Secondary | ICD-10-CM | POA: Diagnosis not present

## 2015-04-16 ENCOUNTER — Ambulatory Visit (AMBULATORY_SURGERY_CENTER): Payer: Self-pay

## 2015-04-16 VITALS — Ht 61.5 in | Wt 131.0 lb

## 2015-04-16 DIAGNOSIS — Z1211 Encounter for screening for malignant neoplasm of colon: Secondary | ICD-10-CM

## 2015-04-16 MED ORDER — NA SULFATE-K SULFATE-MG SULF 17.5-3.13-1.6 GM/177ML PO SOLN: 1.0000 | Freq: Once | ORAL | Status: DC

## 2015-04-16 NOTE — Progress Notes (Signed)
No egg or soy allergies Not on home 02 No previous anesthesia complications No diet or weight loss meds 

## 2015-04-30 ENCOUNTER — Encounter: Payer: Medicare Other | Admitting: Gastroenterology

## 2015-05-14 ENCOUNTER — Ambulatory Visit (INDEPENDENT_AMBULATORY_CARE_PROVIDER_SITE_OTHER): Payer: Medicare Other | Admitting: Family Medicine

## 2015-05-14 ENCOUNTER — Encounter: Payer: Self-pay | Admitting: Family Medicine

## 2015-05-14 VITALS — BP 108/70 | HR 72 | Temp 97.7°F | Resp 12 | Ht 61.0 in | Wt 135.0 lb

## 2015-05-14 DIAGNOSIS — J209 Acute bronchitis, unspecified: Secondary | ICD-10-CM | POA: Diagnosis not present

## 2015-05-14 MED ORDER — GUAIFENESIN-CODEINE 100-10 MG/5ML PO SYRP: 5.0000 mL | ORAL_SOLUTION | Freq: Four times a day (QID) | ORAL | Status: DC | PRN

## 2015-05-14 MED ORDER — ALBUTEROL SULFATE HFA 108 (90 BASE) MCG/ACT IN AERS: 2.0000 | INHALATION_SPRAY | RESPIRATORY_TRACT | Status: DC | PRN

## 2015-05-14 MED ORDER — PREDNISONE 10 MG PO TABS: ORAL_TABLET | ORAL | Status: DC

## 2015-05-14 MED ORDER — AMOXICILLIN-POT CLAVULANATE 875-125 MG PO TABS: 1.0000 | ORAL_TABLET | Freq: Two times a day (BID) | ORAL | Status: DC

## 2015-05-14 NOTE — Progress Notes (Signed)
Pre visit review using our clinic review tool, if applicable. No additional management support is needed unless otherwise documented below in the visit note. 

## 2015-05-14 NOTE — Patient Instructions (Signed)
Take augmentin for bronchitis  Take the prednisone as directed for wheezing  Also albuterol inhaler as needed  Cough medicine -has codeine- use with caution  Rest and drink fluids   Update if not starting to improve in a week or if worsening

## 2015-05-14 NOTE — Assessment & Plan Note (Signed)
With tight chest /wheezing   Take augmentin for bronchitis  Take the prednisone as directed for wheezing  Also albuterol inhaler as needed  Cough medicine -has codeine- use with caution  Rest and drink fluids   Update if not starting to improve in a week or if worsening

## 2015-05-14 NOTE — Progress Notes (Signed)
Subjective:    Patient ID: [name], female    DOB: 1965/10/05, 50 y.o.   MRN: [id number]  HPI Here for 2 wk of uri symptoms   Not getting better Cough prod of phlegm- dark in color (no blood)   Some nasal cong and pnd ? If fever  Has body aches baseline - on medicine for that   Tight chest/wheeze at times and sob at times   Sinuses hurt a bit   Has a hx of some reactive airways with uris in the past   Patient Active Problem List   Diagnosis Date Noted  . HLD (hyperlipidemia) 09/12/2014  . Depression 09/12/2014  . Bipolar disorder ([place]) 09/12/2014  . Neck pain 09/12/2014   Past Medical History  Diagnosis Date  . Hyperlipidemia   . Depression   . Personality disorder   . Post traumatic stress disorder (PTSD)   . History of lump of left breast   . Anxiety   . Arthritis   . Substance abuse    Past Surgical History  Procedure Laterality Date  . Toe surgery Bilateral   . Shoulder surgery Left     x2  . Knee surgery Left     x5   Social History  Substance Use Topics  . Smoking status: Former Smoker -- 1.00 packs/day for 1 years    Types: Cigarettes  . Smokeless tobacco: Never Used  . Alcohol Use: No     Comment: quit 20 years   Family History  Problem Relation Age of Onset  . Heart disease Father   . Hyperlipidemia Father   . Alcohol abuse Brother   . Alcohol abuse Paternal Uncle   . Colon cancer Neg Hx    No Known Allergies Current Outpatient Prescriptions on File Prior to Visit  Medication Sig Dispense Refill  . buPROPion (WELLBUTRIN XL) 300 MG 24 hr tablet Take 1 tablet (300 mg total) by mouth daily. 90 tablet 1  . etodolac (LODINE) 400 MG tablet Take 400 mg by mouth 2 (two) times daily.     Marland Kitchen gabapentin (NEURONTIN) 100 MG capsule Take 100 mg by mouth 3 (three) times daily as needed.     Marland Kitchen HYDROcodone-acetaminophen (NORCO) 7.5-325 MG per tablet Take 1 tablet by mouth 3 (three) times daily.     . Na Sulfate-K Sulfate-Mg Sulf (SUPREP BOWEL PREP)  SOLN Take 1 kit by mouth once. Name brand only, no substitutions, take as directed 354 mL 0  . Paliperidone (INVEGA PO) Take 1 capsule by mouth daily.    Marland Kitchen PARoxetine (PAXIL) 40 MG tablet Take 40 mg by mouth every morning.     . simvastatin (ZOCOR) 20 MG tablet TAKE 1 TABLET BY MOUTH DAILY 90 tablet 1   No current facility-administered medications on file prior to visit.    Review of Systems  Constitutional: Positive for appetite change and fatigue. Negative for fever.  HENT: Positive for congestion, postnasal drip, rhinorrhea, sinus pressure, sneezing and sore throat. Negative for ear pain.   Eyes: Negative for pain and discharge.  Respiratory: Positive for cough, chest tightness and wheezing. Negative for shortness of breath and stridor.   Cardiovascular: Negative for chest pain.  Gastrointestinal: Negative for nausea, vomiting and diarrhea.  Genitourinary: Negative for urgency, frequency and hematuria.  Musculoskeletal: Negative for myalgias and arthralgias.  Skin: Negative for rash.  Neurological: Positive for headaches. Negative for dizziness, weakness and light-headedness.  Psychiatric/Behavioral: Negative for confusion and dysphoric mood.  Objective:   Physical Exam  Constitutional: She appears well-developed and well-nourished. No distress.  Well but fatigued appearing   HENT:  Head: Normocephalic and atraumatic.  Right Ear: External ear normal.  Left Ear: External ear normal.  Mouth/Throat: Oropharynx is clear and moist.  Nares are injected and congested  No sinus tenderness Clear rhinorrhea and post nasal drip   Eyes: Conjunctivae and EOM are normal. Pupils are equal, round, and reactive to light. Right eye exhibits no discharge. Left eye exhibits no discharge.  Neck: Normal range of motion. Neck supple.  Cardiovascular: Normal rate and normal heart sounds.   Pulmonary/Chest: No respiratory distress. She has wheezes. She has no rales. She exhibits no tenderness.    Harsh bs with diffuse wheezes and occ rhonchi No rales Fair air exch No prolonged exp phase   Lymphadenopathy:    She has no cervical adenopathy.  Neurological: She is alert.  Skin: Skin is warm and dry. No rash noted.  Psychiatric: She has a normal mood and affect.          Assessment & Plan:   Problem List Items Addressed This Visit      Respiratory   Acute bronchitis with bronchospasm - Primary    With tight chest /wheezing   Take augmentin for bronchitis  Take the prednisone as directed for wheezing  Also albuterol inhaler as needed  Cough medicine -has codeine- use with caution  Rest and drink fluids   Update if not starting to improve in a week or if worsening

## 2015-05-17 ENCOUNTER — Telehealth: Payer: Self-pay | Admitting: Internal Medicine

## 2015-05-17 NOTE — Telephone Encounter (Signed)
[facility] Patient Name: [name] DOB: 1965/04/01 Initial Comment Caller states that Dr. gave her Rx with codeine and she is having insomnia. She needs to sleeps. Nurse Assessment Nurse: Martyn Ehrich, RN, Felicia Date/Time (Eastern Time): 05/17/2015 3:22:01 PM Confirm and document reason for call. If symptomatic, describe symptoms. You must click the next button to save text entered. ---Pt has been on codeine cough syrup and it is keeping her up for last 3 nights. She has bipolar and feels this medication is keeping her up at night. She saw Dr. Glori Bickers on Mon. She would rather have a different cold medication like tussinex (hydrocodone). She was told by MD not to take her hydrocodone that her pain specialist gave her bc she didnt want her to mix the 2. Feeling hyper but not anxious. NO fever. Cough started in Feb. She would slightly better and then get worse again. No fever Has the patient traveled out of the country within the last 30 days? ---No Does the patient have any new or worsening symptoms? ---Yes Will a triage be completed? ---Yes Related visit to physician within the last 2 weeks? ---Yes Does the PT have any chronic conditions? (i.e. diabetes, asthma, etc.) ---YesList chronic conditions. ---slight asthma, bipolar Is the patient pregnant or possibly pregnant? (Ask all females between the ages of 60-55) ---No Is this a behavioral health or substance abuse call? ---Yes Are you having any thoughts or feelings of harming or killing yourself or someone else? ---No Are you currently experiencing any physical discomfort that you think may be related to the use of alcohol or other drugs? (use substance abuse or alcohol abuse guidelines. These include withdrawal symptoms) ---No Do you worry that you may be hearing or seeing things that others do not? ---No Do you take medications for your  condition(s)? ---Yes List medications here. ---paxil, welbutrin and invega Guidelines Guideline Title Affirmed Question Affirmed Notes Asthma Attack [1] Wheezing or coughing AND [2] hasn't used neb or inhaler twice AND [3] it's availableUrgent Home Treatment with Follow-Up Call Gaddy, RN, FeliciaComments she said no psych issues today but making comments like im hyper and talking fast - denies anxiety tried to triage bipolar bc she was talking about being hyper but it upset her and she did not want to talk about her bipolar. She declined triage for that. She denies overdose. She is with friends at their house. She has not had albuterol today - so she agreed to do 2 treatments 20 min apart and nurse will check back later client also agreed that her coughing woke her up a lot at night Discussed advice for insomnia - but she refused triage for that "I know what is causing it -the codeine" when nurse asked if her cough was better - she said not at all - then her sister got on the phone and said she is having trouble breathing and sister didnt believe nurse was listening to them about the medication question. The nurse asked to talk with the caller. Caller got on the line. Nurse explained that if one is on albuterol it is the best cough medication that one can use. (she said 2 treatments 20 min apart didnt help). Then the caller said "Im just coughing up black ____" (word nurse wont repeat). Told caller in case we get disconnected I do recommend you go to the ER. They were not happy and hung up.be aware nurse told caller GO  TO ER at end of conversation - it sounded like she declined to go Call Id: [id number]

## 2015-05-18 DIAGNOSIS — R05 Cough: Secondary | ICD-10-CM | POA: Diagnosis not present

## 2015-05-18 DIAGNOSIS — R062 Wheezing: Secondary | ICD-10-CM | POA: Diagnosis not present

## 2015-05-18 DIAGNOSIS — J209 Acute bronchitis, unspecified: Secondary | ICD-10-CM | POA: Diagnosis not present

## 2015-05-18 DIAGNOSIS — Z8709 Personal history of other diseases of the respiratory system: Secondary | ICD-10-CM | POA: Diagnosis not present

## 2015-05-18 NOTE — Telephone Encounter (Signed)
Difficult call to interpret -I do think she needs to go to [facility] or ED

## 2015-05-18 NOTE — Telephone Encounter (Signed)
Pt advise of Dr. Marliss Coots comments. Pt was upset and requesting just for a cough med to be sent in but I advise her due to her sxs Dr. Glori Bickers wants her re-evaluated, pt said she will go to an UC

## 2015-05-21 DIAGNOSIS — M791 Myalgia: Secondary | ICD-10-CM | POA: Diagnosis not present

## 2015-05-21 DIAGNOSIS — M542 Cervicalgia: Secondary | ICD-10-CM | POA: Diagnosis not present

## 2015-05-21 DIAGNOSIS — Z79891 Long term (current) use of opiate analgesic: Secondary | ICD-10-CM | POA: Diagnosis not present

## 2015-05-21 DIAGNOSIS — G894 Chronic pain syndrome: Secondary | ICD-10-CM | POA: Diagnosis not present

## 2015-05-24 DIAGNOSIS — J018 Other acute sinusitis: Secondary | ICD-10-CM | POA: Diagnosis not present

## 2015-05-29 DIAGNOSIS — F3181 Bipolar II disorder: Secondary | ICD-10-CM | POA: Diagnosis not present

## 2015-06-08 DIAGNOSIS — J45909 Unspecified asthma, uncomplicated: Secondary | ICD-10-CM | POA: Diagnosis not present

## 2015-06-08 DIAGNOSIS — R0602 Shortness of breath: Secondary | ICD-10-CM | POA: Diagnosis not present

## 2015-06-25 ENCOUNTER — Encounter: Payer: Medicare Other | Admitting: Gastroenterology

## 2015-07-02 ENCOUNTER — Other Ambulatory Visit: Payer: Self-pay | Admitting: Obstetrics and Gynecology

## 2015-07-02 DIAGNOSIS — N6320 Unspecified lump in the left breast, unspecified quadrant: Secondary | ICD-10-CM

## 2015-07-10 ENCOUNTER — Other Ambulatory Visit: Payer: Self-pay | Admitting: Internal Medicine

## 2015-07-17 DIAGNOSIS — M25562 Pain in left knee: Secondary | ICD-10-CM | POA: Diagnosis not present

## 2015-07-17 DIAGNOSIS — Z8709 Personal history of other diseases of the respiratory system: Secondary | ICD-10-CM | POA: Diagnosis not present

## 2015-07-19 DIAGNOSIS — M1712 Unilateral primary osteoarthritis, left knee: Secondary | ICD-10-CM | POA: Diagnosis not present

## 2015-08-06 ENCOUNTER — Ambulatory Visit (HOSPITAL_COMMUNITY)
Admission: RE | Admit: 2015-08-06 | Discharge: 2015-08-06 | Disposition: A | Discharge status: Home / Self Care | Payer: Medicare Other | Source: Ambulatory Visit | Attending: Obstetrics and Gynecology | Admitting: Obstetrics and Gynecology

## 2015-08-06 ENCOUNTER — Ambulatory Visit
Admission: RE | Admit: 2015-08-06 | Discharge: 2015-08-06 | Disposition: A | Discharge status: Home / Self Care | Payer: Medicare Other | Source: Ambulatory Visit | Attending: Obstetrics and Gynecology | Admitting: Obstetrics and Gynecology

## 2015-08-06 ENCOUNTER — Other Ambulatory Visit: Payer: Self-pay | Admitting: Obstetrics and Gynecology

## 2015-08-06 DIAGNOSIS — N6320 Unspecified lump in the left breast, unspecified quadrant: Secondary | ICD-10-CM

## 2015-08-06 DIAGNOSIS — R921 Mammographic calcification found on diagnostic imaging of breast: Secondary | ICD-10-CM | POA: Diagnosis not present

## 2015-08-06 DIAGNOSIS — N63 Unspecified lump in breast: Secondary | ICD-10-CM | POA: Diagnosis not present

## 2015-08-20 DIAGNOSIS — M542 Cervicalgia: Secondary | ICD-10-CM | POA: Diagnosis not present

## 2015-08-20 DIAGNOSIS — G894 Chronic pain syndrome: Secondary | ICD-10-CM | POA: Diagnosis not present

## 2015-08-20 DIAGNOSIS — M25562 Pain in left knee: Secondary | ICD-10-CM | POA: Diagnosis not present

## 2015-08-20 DIAGNOSIS — M791 Myalgia: Secondary | ICD-10-CM | POA: Diagnosis not present

## 2015-08-21 DIAGNOSIS — F3181 Bipolar II disorder: Secondary | ICD-10-CM | POA: Diagnosis not present

## 2015-09-12 ENCOUNTER — Encounter: Payer: Self-pay | Admitting: Internal Medicine

## 2015-09-12 ENCOUNTER — Ambulatory Visit (INDEPENDENT_AMBULATORY_CARE_PROVIDER_SITE_OTHER): Payer: Medicare Other | Admitting: Internal Medicine

## 2015-09-12 VITALS — BP 112/78 | HR 76 | Temp 98.0°F | Wt 144.0 lb

## 2015-09-12 DIAGNOSIS — F3170 Bipolar disorder, currently in remission, most recent episode unspecified: Secondary | ICD-10-CM

## 2015-09-12 DIAGNOSIS — R0683 Snoring: Secondary | ICD-10-CM | POA: Diagnosis not present

## 2015-09-12 DIAGNOSIS — L989 Disorder of the skin and subcutaneous tissue, unspecified: Secondary | ICD-10-CM

## 2015-09-12 DIAGNOSIS — E785 Hyperlipidemia, unspecified: Secondary | ICD-10-CM

## 2015-09-12 DIAGNOSIS — G4719 Other hypersomnia: Secondary | ICD-10-CM | POA: Diagnosis not present

## 2015-09-12 DIAGNOSIS — F32A Depression, unspecified: Secondary | ICD-10-CM

## 2015-09-12 DIAGNOSIS — F329 Major depressive disorder, single episode, unspecified: Secondary | ICD-10-CM

## 2015-09-12 DIAGNOSIS — M542 Cervicalgia: Secondary | ICD-10-CM

## 2015-09-12 NOTE — Assessment & Plan Note (Signed)
Continue to follow with [facility] Continue Invega and Hydroxyzine

## 2015-09-12 NOTE — Progress Notes (Signed)
Pre visit review using our clinic review tool, if applicable. No additional management support is needed unless otherwise documented below in the visit note. 

## 2015-09-12 NOTE — Addendum Note (Signed)
Addended by: Jearld Fenton on: 09/12/2015 02:46 PM   Modules accepted: Miquel Dunn

## 2015-09-12 NOTE — Assessment & Plan Note (Signed)
Continue to follow with [facility] Continue Paxil and Wellbutrin

## 2015-09-12 NOTE — Progress Notes (Addendum)
HPI:  Pt presents to the clinic today for follow up of chronic conditions.  Depression: Chronic but stable on Wellbutrin and Paxil. Denies SI/HI  Bipolar: She is taking [place]. She reports she has been on this for at least 10 years. She follows with [facility] in [place].   Chronic neck pain: She takes Lodine, Neurontin  and Norco as needed. She goes to [facility] in [place]. They have done xrays and MRI's but she is not sure what they showed.  HLD: Her last LDL was 93. She takes Zocor daily, she denies muscle pain. She does try to consume a low fat diet.  She also has some concerns about insomnia. She is able to fall asleep but can not stay asleep. She has been told that she snores. She does feel tired during the day. She has taken Trazadone and Seroquel, but she did not like the way they made her feel. She is requesting a referral for a sleep study today.  She also has a skin lesion on her left leg she would like me to look at.  Past Medical History  Diagnosis Date  . Hyperlipidemia   . Depression   . Personality disorder   . Post traumatic stress disorder (PTSD)   . History of lump of left breast   . Anxiety   . Arthritis   . Substance abuse     Current Outpatient Prescriptions  Medication Sig Dispense Refill  . albuterol (PROVENTIL HFA;VENTOLIN HFA) 108 (90 Base) MCG/ACT inhaler Inhale 2 puffs into the lungs every 4 (four) hours as needed for wheezing or shortness of breath. 1 Inhaler 0  . amoxicillin-clavulanate (AUGMENTIN) 875-125 MG tablet Take 1 tablet by mouth 2 (two) times daily. 14 tablet 0  . buPROPion (WELLBUTRIN XL) 300 MG 24 hr tablet Take 1 tablet (300 mg total) by mouth daily. 90 tablet 1  . etodolac (LODINE) 400 MG tablet Take 400 mg by mouth 2 (two) times daily.     Marland Kitchen gabapentin (NEURONTIN) 100 MG capsule Take 100 mg by mouth 3 (three) times daily as needed.     Marland Kitchen guaiFENesin-codeine (ROBITUSSIN AC) 100-10 MG/5ML syrup Take 5-10 mLs by mouth 4  (four) times daily as needed for cough (caution of sedation). 120 mL 0  . HYDROcodone-acetaminophen (NORCO) 7.5-325 MG per tablet Take 1 tablet by mouth 3 (three) times daily.     . Na Sulfate-K Sulfate-Mg Sulf (SUPREP BOWEL PREP) SOLN Take 1 kit by mouth once. Name brand only, no substitutions, take as directed 354 mL 0  . Paliperidone (INVEGA PO) Take 1 capsule by mouth daily.    Marland Kitchen PARoxetine (PAXIL) 40 MG tablet Take 40 mg by mouth every morning.     . predniSONE (DELTASONE) 10 MG tablet Take 3 pills once daily by mouth for 3 days, then 2 pills once daily for 3 days, then 1 pill once daily for 3 days and then stop 18 tablet 0  . simvastatin (ZOCOR) 20 MG tablet TAKE 1 TABLET BY MOUTH DAILY 90 tablet 1   No current facility-administered medications for this visit.    No Known Allergies  Family History  Problem Relation Age of Onset  . Heart disease Father   . Hyperlipidemia Father   . Alcohol abuse Brother   . Alcohol abuse Paternal Uncle   . Colon cancer Neg Hx     Social History   Social History  . Marital Status: Single    Spouse Name: N/A  . Number  of Children: N/A  . Years of Education: N/A   Occupational History  . Not on file.   Social History Main Topics  . Smoking status: Former Smoker -- 1.00 packs/day for 1 years    Types: Cigarettes  . Smokeless tobacco: Never Used  . Alcohol Use: No     Comment: quit 20 years  . Drug Use: Yes    Special: Marijuana     Comment: occasional  . Sexual Activity: Not Currently   Other Topics Concern  . Not on file   Social History Narrative    Subjective:   Review of Systems:   Constitutional: Pt reports daytime fatigue. Denies fever, malaise, headache or abrupt weight changes.  Respiratory: Denies difficulty breathing, shortness of breath, cough or sputum production.   Cardiovascular: Denies chest pain, chest tightness, palpitations or swelling in the hands or feet.  Musculoskeletal: Pt reports chronic neck pain.  Denies decrease in range of motion, difficulty with gait, muscle pain or joint swelling.  Skin: Pt reports skin lesion of left leg. Denies redness, rashes, or ulcercations.  Neurological: Pt reports insomnia. Denies dizziness, difficulty with memory, difficulty with speech or problems with balance and coordination.  Psych: Pt reports chronic depression. Denies anxiety, SI/HI.  No other specific complaints in a complete review of systems (except as listed in HPI above).  Objective:  PE:  \BP 112/78 mmHg  Pulse 76  Temp(Src) 98 F (36.7 C) (Oral)  Wt 144 lb (65.318 kg)  SpO2 97%   Wt Readings from Last 3 Encounters:  05/14/15 135 lb (61.236 kg)  04/16/15 131 lb (59.421 kg)  03/15/15 134 lb (60.782 kg)    General: Appears her stated age, well developed, well nourished in NAD. Skin: 1 cm round, scaly, discolored lesion to left anterior leg. HEENT: Head: normal shape and size; Eyes: sclera white, no icterus, conjunctiva pink, PERRLA and EOMs intact;   Cardiovascular: Normal rate and rhthym. S1,S2 noted.  No murmur, rubs or gallops noted.   Pulmonary/Chest: Normal effort and positive vesicular breath sounds. No respiratory distress. No wheezes, rales or ronchi noted.  Musculoskeletal: Normal flexion, extension and rotation of the cervical spine. Pain with palpation over the cervical spine. No signs of joint swelling. No difficulty with gait.  Neurological: Alert and oriented.  Psychiatric: She is very fidgety today. She is making eye contact. Judgement and thought content seem normal.    BMET    Component Value Date/Time   NA 133* 03/15/2015 1507   K 4.5 03/29/2015 1423   CL 100 03/15/2015 1507   CO2 31 03/15/2015 1507   GLUCOSE 94 03/15/2015 1507   BUN 10 03/15/2015 1507   CREATININE 1.05 03/15/2015 1507   CALCIUM 9.9 03/15/2015 1507    Lipid Panel     Component Value Date/Time   CHOL 169 03/15/2015 1507   TRIG 105.0 03/15/2015 1507   HDL 54.30 03/15/2015 1507    CHOLHDL 3 03/15/2015 1507   VLDL 21.0 03/15/2015 1507   LDLCALC 93 03/15/2015 1507    CBC    Component Value Date/Time   WBC 8.8 03/15/2015 1507   RBC 4.38 03/15/2015 1507   HGB 13.8 03/15/2015 1507   HCT 41.3 03/15/2015 1507   PLT 399.0 03/15/2015 1507   MCV 94.3 03/15/2015 1507   MCHC 33.3 03/15/2015 1507   RDW 14.5 03/15/2015 1507    Hgb A1C No results found for: HGBA1C    Assessment and Plan:   Daytime fatigue, snoring:  This could be  related to her mental health issues She is insisting on a sleep study Neck circumference: 14.75 ESS: 17 Referral placed to pulmonology for sleep study  Skin lesion of leg:  Concerning for skin cancer Referral placed to dermatology for further evaluation and treatment  RTC in 6 months for annual exam

## 2015-09-12 NOTE — Patient Instructions (Signed)

## 2015-09-12 NOTE — Assessment & Plan Note (Signed)
She declines Lipid Profile today Encouraged her to consume a low fat diet Continue Zocor for now

## 2015-09-12 NOTE — Assessment & Plan Note (Signed)
Continue to follow with [facility] Continue Lodine, Neurontin and Norco

## 2015-10-06 ENCOUNTER — Other Ambulatory Visit: Payer: Self-pay | Admitting: Internal Medicine

## 2015-10-15 DIAGNOSIS — M791 Myalgia: Secondary | ICD-10-CM | POA: Diagnosis not present

## 2015-10-15 DIAGNOSIS — M542 Cervicalgia: Secondary | ICD-10-CM | POA: Diagnosis not present

## 2015-10-15 DIAGNOSIS — G894 Chronic pain syndrome: Secondary | ICD-10-CM | POA: Diagnosis not present

## 2015-10-15 DIAGNOSIS — M25562 Pain in left knee: Secondary | ICD-10-CM | POA: Diagnosis not present

## 2015-10-30 DIAGNOSIS — L821 Other seborrheic keratosis: Secondary | ICD-10-CM | POA: Diagnosis not present

## 2015-10-30 DIAGNOSIS — L814 Other melanin hyperpigmentation: Secondary | ICD-10-CM | POA: Diagnosis not present

## 2015-10-30 DIAGNOSIS — L570 Actinic keratosis: Secondary | ICD-10-CM | POA: Diagnosis not present

## 2015-10-30 DIAGNOSIS — D235 Other benign neoplasm of skin of trunk: Secondary | ICD-10-CM | POA: Diagnosis not present

## 2015-10-30 DIAGNOSIS — D1801 Hemangioma of skin and subcutaneous tissue: Secondary | ICD-10-CM | POA: Diagnosis not present

## 2015-10-30 DIAGNOSIS — L259 Unspecified contact dermatitis, unspecified cause: Secondary | ICD-10-CM | POA: Diagnosis not present

## 2015-10-30 DIAGNOSIS — A630 Anogenital (venereal) warts: Secondary | ICD-10-CM | POA: Diagnosis not present

## 2015-10-30 DIAGNOSIS — L820 Inflamed seborrheic keratosis: Secondary | ICD-10-CM | POA: Diagnosis not present

## 2015-11-09 DIAGNOSIS — B029 Zoster without complications: Secondary | ICD-10-CM | POA: Diagnosis not present

## 2015-11-13 DIAGNOSIS — F3181 Bipolar II disorder: Secondary | ICD-10-CM | POA: Diagnosis not present

## 2015-11-21 ENCOUNTER — Ambulatory Visit (INDEPENDENT_AMBULATORY_CARE_PROVIDER_SITE_OTHER): Payer: Medicare Other | Admitting: Pulmonary Disease

## 2015-11-21 ENCOUNTER — Encounter: Payer: Self-pay | Admitting: Pulmonary Disease

## 2015-11-21 VITALS — BP 128/78 | HR 76 | Ht 61.0 in | Wt 148.0 lb

## 2015-11-21 DIAGNOSIS — J4520 Mild intermittent asthma, uncomplicated: Secondary | ICD-10-CM

## 2015-11-21 DIAGNOSIS — G894 Chronic pain syndrome: Secondary | ICD-10-CM | POA: Insufficient documentation

## 2015-11-21 DIAGNOSIS — J45909 Unspecified asthma, uncomplicated: Secondary | ICD-10-CM | POA: Insufficient documentation

## 2015-11-21 DIAGNOSIS — G4719 Other hypersomnia: Secondary | ICD-10-CM

## 2015-11-21 DIAGNOSIS — G4710 Hypersomnia, unspecified: Secondary | ICD-10-CM

## 2015-11-21 NOTE — Assessment & Plan Note (Signed)
Pt has snoring, has gasping, choking, has witnessed apneas. Wakes up several times at night, no reason. Sleeps 7hrs/night. Has unrefreshed sleep.  Has hypersomnia affecting her fxnality. (-) naps usually.  (-) recent abnormal behavior in sleep. Some nightmares 1 yr ago.   ESS 13.   Plan :  We discussed about the diagnosis of Obstructive Sleep Apnea (OSA) and implications of untreated OSA. We discussed about CPAP and BiPaP as possible treatment options.    We will schedule the patient for a sleep study. Plan for split night sleep study. Anticipate no issues with cpap. Mother has OSA and uses cpap. She has chronic pain for which she takes opiates when necessary.   Patient was instructed to call the office if he/she has not heard back from the office 1-2 weeks after the sleep study.   Patient was instructed to call the office if he/she is having issues with the PAP device.   We discussed good sleep hygiene.   Patient was advised not to engage in activities requiring concentration and/or vigilance if he/she is sleepy.  Patient was advised not to drive if he/she is sleepy.

## 2015-11-21 NOTE — Patient Instructions (Signed)
It was a pleasure taking care of you today!  We will schedule you to have a sleep study to determine if you have sleep apnea.    We will get a lab sleep study.  You will be scheduled to have a lab sleep study in 4-6 weeks.  Someone from the sleep lab will call you in 2-3 days to schedule the study with you.  They usually have cancellations every night so most likely, they will have openings for a lab sleep study next week or so.  We encourage you to do your sleep study then if possible. Please give [place] a call in a week is no one from the sleep lab calls you in 2-3 days.   If the sleep study is positive, we will order you a CPAP  machine.  Please call the office if you do NOT receive your machine in the next 1-2 weeks.   Please make sure you use your CPAP device everytime you sleep.  We will monitor the usage of your machine per your insurance requirement.  Your insurance company may take the machine from you if you are not using it regularly.   Please clean the mask, tubings, filter, water reservoir with soapy water every week.  Please use distilled water for the water reservoir.   Please call the office or your machine provider (DME company) if you are having issues with the device.   Return to clinic in 8 weeks with Dr. Corrie Dandy or NP

## 2015-11-21 NOTE — Assessment & Plan Note (Signed)
Asthma has been stable. 15-pack-year smoking history, quit in 1998. Triggers include change in weather. When necessary albuterol.

## 2015-11-21 NOTE — Progress Notes (Signed)
Subjective:    Patient ID: [name], female    DOB: [birth year], 50 y.o.   MRN: [id number]  HPI   This is the case of [name], 50 y.o. Female, who was referred by Webb Silversmith  in consultation regarding possible OSA.    As you very well know, patient has a 15 PY smoking history, quit  in 1998, smokes marijuana daily for chronic pain for arthritis. She was dxed with asthma as an adult, triggers: humid weather. Uses alb MDI 1x/month. Not on O2. Asthma is stable.   She has snoring, has gasping, choking, has witnessed apneas. Wakes up several times at night, no reason. Sleeps 7hrs/night. Has unrefreshed sleep.  Has hypersomnia affecting her fxnality. (-) naps usually.  (-) recent abnormal behavior in sleep. Some nightmares 1 yr ago.   ESS 13.  She is on disability.   Mother has OSA and she uses cpap.      Review of Systems  Constitutional: Negative.  Negative for fever and unexpected weight change.  HENT: Positive for congestion, postnasal drip and rhinorrhea. Negative for dental problem, ear pain, nosebleeds, sinus pressure, sneezing, sore throat and trouble swallowing.   Eyes: Negative.  Negative for redness and itching.  Respiratory: Positive for cough and wheezing. Negative for chest tightness and shortness of breath.   Cardiovascular: Negative.  Negative for palpitations and leg swelling.  Gastrointestinal: Positive for nausea. Negative for vomiting.  Endocrine: Negative.   Genitourinary: Negative.  Negative for dysuria.  Musculoskeletal: Positive for arthralgias and back pain. Negative for joint swelling.  Skin: Negative.  Negative for rash.  Allergic/Immunologic: Positive for environmental allergies.  Neurological: Negative.  Negative for headaches.  Hematological: Negative.  Does not bruise/bleed easily.  Psychiatric/Behavioral: Negative.  Negative for dysphoric mood. The patient is not nervous/anxious.    Past Medical History:  Diagnosis Date  . Anxiety   .  Arthritis   . Depression   . History of lump of left breast   . Hyperlipidemia   . Personality disorder   . Post traumatic stress disorder (PTSD)   . Substance abuse    (-) CA, DVT  Family History  Problem Relation Age of Onset  . Heart disease Father   . Hyperlipidemia Father   . Alcohol abuse Brother   . Alcohol abuse Paternal Uncle   . Colon cancer Neg Hx      Past Surgical History:  Procedure Laterality Date  . KNEE SURGERY Left    x5  . SHOULDER SURGERY Left    x2  . TOE SURGERY Bilateral     Social History   Social History  . Marital status: Single    Spouse name: N/A  . Number of children: N/A  . Years of education: N/A   Occupational History  . Not on file.   Social History Main Topics  . Smoking status: Former Smoker    Packs/day: 1.00    Years: 1.00    Types: Cigarettes  . Smokeless tobacco: Never Used  . Alcohol use No     Comment: quit 20 years  . Drug use:     Types: Marijuana     Comment: occasional  . Sexual activity: Not Currently   Other Topics Concern  . Not on file   Social History Narrative  . No narrative on file     Allergies  Allergen Reactions  . Effexor [Venlafaxine] Other (See Comments)    PT DOES SIDE EFFECTS  . Lamictal [Lamotrigine]  Rash     Outpatient Medications Prior to Visit  Medication Sig Dispense Refill  . albuterol (PROVENTIL HFA;VENTOLIN HFA) 108 (90 Base) MCG/ACT inhaler Inhale 2 puffs into the lungs every 4 (four) hours as needed for wheezing or shortness of breath. 1 Inhaler 0  . buPROPion (WELLBUTRIN XL) 300 MG 24 hr tablet Take 1 tablet (300 mg total) by mouth daily. 90 tablet 1  . etodolac (LODINE) 400 MG tablet Take 400 mg by mouth 2 (two) times daily.     Marland Kitchen gabapentin (NEURONTIN) 100 MG capsule Take 100 mg by mouth 4 (four) times daily.     . hydrOXYzine (ATARAX/VISTARIL) 50 MG tablet Take 50 mg by mouth 3 (three) times daily.     . Paliperidone (INVEGA PO) Take 1 capsule by mouth daily.    Marland Kitchen  PARoxetine (PAXIL) 40 MG tablet Take 40 mg by mouth every morning.     . simvastatin (ZOCOR) 20 MG tablet Take 1 tablet (20 mg total) by mouth daily. 90 tablet 1  . tiZANidine (ZANAFLEX) 2 MG tablet Take 1 tablet by mouth 3 (three) times daily.     No facility-administered medications prior to visit.    Meds ordered this encounter  Medications  . QUEtiapine (SEROQUEL) 25 MG tablet    Sig: Take 25 mg by mouth as needed.        Objective:   Physical Exam Vitals:  Vitals:   11/21/15 1512  BP: 128/78  Pulse: 76  SpO2: 96%  Weight: 148 lb (67.1 kg)  Height: 5\' 1"  (1.549 m)    Constitutional/General:  Pleasant, well-nourished, well-developed, not in any distress,  Comfortably seating.  Well kempt  Body mass index is 27.96 kg/m. Wt Readings from Last 3 Encounters:  11/21/15 148 lb (67.1 kg)  09/12/15 144 lb (65.3 kg)  05/14/15 135 lb (61.2 kg)     HEENT: Pupils equal and reactive to light and accommodation. Anicteric sclerae. Normal nasal mucosa.   No oral  lesions,  mouth clear,  oropharynx clear, no postnasal drip. (-) Oral thrush. No dental caries.  Airway - Mallampati class III  Neck: No masses. Midline trachea. No JVD, (-) LAD. (-) bruits appreciated.  Respiratory/Chest: Grossly normal chest. (-) deformity. (-) Accessory muscle use.  Symmetric expansion. (-) Tenderness on palpation.  Resonant on percussion.  Diminished BS on both lower lung zones. (-) wheezing, crackles, rhonchi (-) egophony  Cardiovascular: Regular rate and  rhythm, heart sounds normal, no murmur or gallops, no peripheral edema  Gastrointestinal:  Normal bowel sounds. Soft, non-tender. No hepatosplenomegaly.  (-) masses.   Musculoskeletal:  Normal muscle tone. Normal gait.   Extremities: Grossly normal. (-) clubbing, cyanosis.  (-) edema  Skin: (-) rash,lesions seen.   Neurological/Psychiatric : alert, oriented to time, place, person. Normal mood and affect             Assessment & Plan:  Hypersomnia Pt has snoring, has gasping, choking, has witnessed apneas. Wakes up several times at night, no reason. Sleeps 7hrs/night. Has unrefreshed sleep.  Has hypersomnia affecting her fxnality. (-) naps usually.  (-) recent abnormal behavior in sleep. Some nightmares 1 yr ago.   ESS 13.   Plan :  We discussed about the diagnosis of Obstructive Sleep Apnea (OSA) and implications of untreated OSA. We discussed about CPAP and BiPaP as possible treatment options.    We will schedule the patient for a sleep study. Plan for split night sleep study. Anticipate no issues with cpap. Mother has  OSA and uses cpap. She has chronic pain for which she takes opiates when necessary.   Patient was instructed to call the office if he/she has not heard back from the office 1-2 weeks after the sleep study.   Patient was instructed to call the office if he/she is having issues with the PAP device.   We discussed good sleep hygiene.   Patient was advised not to engage in activities requiring concentration and/or vigilance if he/she is sleepy.  Patient was advised not to drive if he/she is sleepy.     Asthma Asthma has been stable. 15-pack-year smoking history, quit in 1998. Triggers include change in weather. When necessary albuterol.  Chronic pain syndrome Patient with chronic joint pains. Takes opiates when necessary. Uses marijuana daily for chronic pain. Hopefully this will be better with CPAP treatment.   Thank you very much for letting me participate in this patient's care. Please do not hesitate to give me a call if you have any questions or concerns regarding the treatment plan.   Patient will follow up with me in 6-8 weeks.     Monica Becton, MD 11/21/2015   5:24 PM [facility] [place] Pager: [phone] Office: [phone], Fax: [phone]

## 2015-11-21 NOTE — Assessment & Plan Note (Signed)
Patient with chronic joint pains. Takes opiates when necessary. Uses marijuana daily for chronic pain. Hopefully this will be better with CPAP treatment.

## 2015-11-26 ENCOUNTER — Encounter: Payer: Self-pay | Admitting: Family Medicine

## 2015-11-26 ENCOUNTER — Ambulatory Visit (INDEPENDENT_AMBULATORY_CARE_PROVIDER_SITE_OTHER): Payer: Medicare Other | Admitting: Family Medicine

## 2015-11-26 VITALS — BP 126/74 | HR 66 | Temp 97.8°F | Ht 61.0 in | Wt 144.0 lb

## 2015-11-26 DIAGNOSIS — J209 Acute bronchitis, unspecified: Secondary | ICD-10-CM

## 2015-11-26 MED ORDER — PREDNISONE 10 MG PO TABS: ORAL_TABLET | ORAL | 0 refills | Status: DC

## 2015-11-26 MED ORDER — AMOXICILLIN-POT CLAVULANATE 875-125 MG PO TABS: 1.0000 | ORAL_TABLET | Freq: Two times a day (BID) | ORAL | 0 refills | Status: DC

## 2015-11-26 MED ORDER — ALBUTEROL SULFATE HFA 108 (90 BASE) MCG/ACT IN AERS
2.0000 | INHALATION_SPRAY | RESPIRATORY_TRACT | 1 refills | Status: DC | PRN
Start: 2015-11-26 — End: 2015-12-18

## 2015-11-26 NOTE — Progress Notes (Signed)
Pre visit review using our clinic review tool, if applicable. No additional management support is needed unless otherwise documented below in the visit note. 

## 2015-11-26 NOTE — Progress Notes (Signed)
Subjective:    Patient ID: [name], female    DOB: 1965-11-08, 50 y.o.   MRN: [id number]  HPI Here for uri symptoms   Post nasal drip  ST-scratchy  Cough and some wheezing  Cough is productive - brownish in color  Stuffy nose   Hx of asthma  She needs a new proventil inhaler    Does not smoke cigarettes   Just got over shingles 2 weeks ago -still some pain   Patient Active Problem List   Diagnosis Date Noted  . Hypersomnia 11/21/2015  . Asthma 11/21/2015  . Chronic pain syndrome 11/21/2015  . Acute bronchitis with bronchospasm 05/14/2015  . HLD (hyperlipidemia) 09/12/2014  . Depression 09/12/2014  . Bipolar disorder ([place]) 09/12/2014  . Neck pain 09/12/2014   Past Medical History:  Diagnosis Date  . Anxiety   . Arthritis   . Depression   . History of lump of left breast   . Hyperlipidemia   . Personality disorder   . Post traumatic stress disorder (PTSD)   . Substance abuse    Past Surgical History:  Procedure Laterality Date  . KNEE SURGERY Left    x5  . SHOULDER SURGERY Left    x2  . TOE SURGERY Bilateral    Social History  Substance Use Topics  . Smoking status: Former Smoker    Packs/day: 1.00    Years: 1.00    Types: Cigarettes  . Smokeless tobacco: Never Used  . Alcohol use No     Comment: quit 20 years   Family History  Problem Relation Age of Onset  . Heart disease Father   . Hyperlipidemia Father   . Alcohol abuse Brother   . Alcohol abuse Paternal Uncle   . Colon cancer Neg Hx    Allergies  Allergen Reactions  . Effexor [Venlafaxine] Other (See Comments)    PT DOES SIDE EFFECTS  . Lamictal [Lamotrigine] Rash   Current Outpatient Prescriptions on File Prior to Visit  Medication Sig Dispense Refill  . buPROPion (WELLBUTRIN XL) 300 MG 24 hr tablet Take 1 tablet (300 mg total) by mouth daily. 90 tablet 1  . etodolac (LODINE) 400 MG tablet Take 400 mg by mouth 2 (two) times daily.     Marland Kitchen gabapentin (NEURONTIN) 100 MG capsule  Take 100 mg by mouth 4 (four) times daily.     . hydrOXYzine (ATARAX/VISTARIL) 50 MG tablet Take 50 mg by mouth 3 (three) times daily.     . Paliperidone (INVEGA PO) Take 1 capsule by mouth daily.    Marland Kitchen PARoxetine (PAXIL) 40 MG tablet Take 40 mg by mouth every morning.     Marland Kitchen QUEtiapine (SEROQUEL) 25 MG tablet Take 25 mg by mouth as needed.    . simvastatin (ZOCOR) 20 MG tablet Take 1 tablet (20 mg total) by mouth daily. 90 tablet 1  . tiZANidine (ZANAFLEX) 2 MG tablet Take 1 tablet by mouth 3 (three) times daily.     No current facility-administered medications on file prior to visit.       Review of Systems  Constitutional: Positive for appetite change and fatigue. Negative for fever.  HENT: Positive for congestion, postnasal drip, rhinorrhea, sinus pressure, sneezing and sore throat. Negative for ear pain.   Eyes: Negative for pain and discharge.  Respiratory: Positive for cough. Negative for shortness of breath, wheezing and stridor.   Cardiovascular: Negative for chest pain.  Gastrointestinal: Negative for diarrhea, nausea and vomiting.  Genitourinary: Negative for frequency,  hematuria and urgency.  Musculoskeletal: Negative for arthralgias and myalgias.  Skin: Negative for rash.  Neurological: Positive for headaches. Negative for dizziness, weakness and light-headedness.  Psychiatric/Behavioral: Negative for confusion and dysphoric mood.       Objective:   Physical Exam  Constitutional: She appears well-developed and well-nourished. No distress.  Well appearing   HENT:  Head: Normocephalic and atraumatic.  Right Ear: External ear normal.  Left Ear: External ear normal.  Mouth/Throat: Oropharynx is clear and moist.  Nares are injected and congested  No sinus tenderness Clear rhinorrhea and post nasal drip   Eyes: Conjunctivae and EOM are normal. Pupils are equal, round, and reactive to light. Right eye exhibits no discharge. Left eye exhibits no discharge.  Neck: Normal  range of motion. Neck supple.  Cardiovascular: Normal rate and normal heart sounds.   Pulmonary/Chest: Effort normal and breath sounds normal. No respiratory distress. She has no wheezes. She has no rales. She exhibits no tenderness.  Diffuse exp wheezes with mildly prolonged exp phase  No rales  Few scattered rhonchi  Upper airway sounds noted   Lymphadenopathy:    She has no cervical adenopathy.  Neurological: She is alert. No cranial nerve deficit.  Skin: Skin is warm and dry. No rash noted.  Psychiatric: She has a normal mood and affect.          Assessment & Plan:   Problem List Items Addressed This Visit      Respiratory   Acute bronchitis with bronchospasm    From URI  Significant wheezing  Px augmentin  Prednisone taper  Refilled albuterol  Disc symptomatic care - see instructions on AVS  Update if not starting to improve in a week or if worsening    You have an upper respiratory infection with bronchitis and wheezing  Take augmentin as directed  Use inhaler as needed Take prednisone as directed  Drink lots of fluids and rest  mucinex DM over the counter is good for cough   Update if not starting to improve in a week or if worsening        Other Visit Diagnoses   None.

## 2015-11-26 NOTE — Assessment & Plan Note (Signed)
From URI  Significant wheezing  Px augmentin  Prednisone taper  Refilled albuterol  Disc symptomatic care - see instructions on AVS  Update if not starting to improve in a week or if worsening    You have an upper respiratory infection with bronchitis and wheezing  Take augmentin as directed  Use inhaler as needed Take prednisone as directed  Drink lots of fluids and rest  mucinex DM over the counter is good for cough   Update if not starting to improve in a week or if worsening

## 2015-11-26 NOTE — Patient Instructions (Signed)
You have an upper respiratory infection with bronchitis and wheezing  Take augmentin as directed  Use inhaler as needed Take prednisone as directed  Drink lots of fluids and rest  mucinex DM over the counter is good for cough   Update if not starting to improve in a week or if worsening

## 2015-12-10 DIAGNOSIS — G894 Chronic pain syndrome: Secondary | ICD-10-CM | POA: Diagnosis not present

## 2015-12-10 DIAGNOSIS — M791 Myalgia: Secondary | ICD-10-CM | POA: Diagnosis not present

## 2015-12-10 DIAGNOSIS — M542 Cervicalgia: Secondary | ICD-10-CM | POA: Diagnosis not present

## 2015-12-10 DIAGNOSIS — M25562 Pain in left knee: Secondary | ICD-10-CM | POA: Diagnosis not present

## 2015-12-18 ENCOUNTER — Ambulatory Visit (INDEPENDENT_AMBULATORY_CARE_PROVIDER_SITE_OTHER): Payer: Medicare Other | Admitting: Internal Medicine

## 2015-12-18 ENCOUNTER — Ambulatory Visit (INDEPENDENT_AMBULATORY_CARE_PROVIDER_SITE_OTHER)
Admission: RE | Admit: 2015-12-18 | Discharge: 2015-12-18 | Disposition: A | Discharge status: Home / Self Care | Payer: Medicare Other | Source: Ambulatory Visit | Attending: Internal Medicine | Admitting: Internal Medicine

## 2015-12-18 ENCOUNTER — Encounter: Payer: Self-pay | Admitting: Internal Medicine

## 2015-12-18 ENCOUNTER — Ambulatory Visit: Payer: Medicare Other | Admitting: Internal Medicine

## 2015-12-18 ENCOUNTER — Other Ambulatory Visit: Payer: Self-pay | Admitting: Internal Medicine

## 2015-12-18 VITALS — BP 110/76 | HR 85 | Temp 97.9°F | Wt 151.8 lb

## 2015-12-18 DIAGNOSIS — R05 Cough: Secondary | ICD-10-CM | POA: Diagnosis not present

## 2015-12-18 DIAGNOSIS — Z23 Encounter for immunization: Secondary | ICD-10-CM

## 2015-12-18 DIAGNOSIS — F1220 Cannabis dependence, uncomplicated: Secondary | ICD-10-CM

## 2015-12-18 DIAGNOSIS — R059 Cough, unspecified: Secondary | ICD-10-CM

## 2015-12-18 DIAGNOSIS — F1290 Cannabis use, unspecified, uncomplicated: Secondary | ICD-10-CM

## 2015-12-18 DIAGNOSIS — R062 Wheezing: Secondary | ICD-10-CM | POA: Diagnosis not present

## 2015-12-18 DIAGNOSIS — G8929 Other chronic pain: Secondary | ICD-10-CM

## 2015-12-18 DIAGNOSIS — M545 Low back pain: Secondary | ICD-10-CM

## 2015-12-18 DIAGNOSIS — T148XXA Other injury of unspecified body region, initial encounter: Secondary | ICD-10-CM

## 2015-12-18 MED ORDER — PREDNISONE 10 MG PO TABS: ORAL_TABLET | ORAL | 0 refills | Status: DC

## 2015-12-18 MED ORDER — ALBUTEROL SULFATE HFA 108 (90 BASE) MCG/ACT IN AERS: 2.0000 | INHALATION_SPRAY | RESPIRATORY_TRACT | 1 refills | Status: DC | PRN

## 2015-12-18 MED ORDER — HYDROCODONE-HOMATROPINE 5-1.5 MG/5ML PO SYRP: 5.0000 mL | ORAL_SOLUTION | Freq: Three times a day (TID) | ORAL | 0 refills | Status: DC | PRN

## 2015-12-18 NOTE — Progress Notes (Signed)
Subjective:    Patient ID: [name], female    DOB: 06-09-1965, 50 y.o.   MRN: [id number]  HPI  Pt presents to the clinic today with c/o low back pain. This started 1 week ago. She describes the pain as achy and burning. The pain radiates down into her left buttock. She denies numbness or tingling in her left leg. She denies injury to the area but reports she has been helping her paraplegic friend lift his legs. She has been taking Zanaflex with minimal relief. She has a history of chronic back pain, for which she takes Etodolac and THC as needed.  She also reports facial pain and pressure, runny nose, and cough. She reports this started 3 weeks ago. She was seen 11/26/15 for the same. She was given Prednisone and Augmentin. She reports she took the medication as prescribed but still has the facial pain and pressure, runny nose and cough. She is blowing clear mucous out of her nose. The cough is productive of brown mucous. She denies fever, chills or body aches. She has taken Tylenol Cold and Flu without any relief. She does not smoke cigarettes. She has not had sick contacts that she is aware of.  Review of Systems      Past Medical History:  Diagnosis Date  . Anxiety   . Arthritis   . Depression   . History of lump of left breast   . Hyperlipidemia   . Personality disorder   . Post traumatic stress disorder (PTSD)   . Substance abuse     Current Outpatient Prescriptions  Medication Sig Dispense Refill  . albuterol (PROVENTIL HFA;VENTOLIN HFA) 108 (90 Base) MCG/ACT inhaler Inhale 2 puffs into the lungs every 4 (four) hours as needed for wheezing or shortness of breath. 1 Inhaler 1  . buPROPion (WELLBUTRIN XL) 300 MG 24 hr tablet Take 1 tablet (300 mg total) by mouth daily. 90 tablet 1  . etodolac (LODINE) 400 MG tablet Take 400 mg by mouth 2 (two) times daily.     Marland Kitchen gabapentin (NEURONTIN) 100 MG capsule Take 100 mg by mouth 4 (four) times daily.     . hydrOXYzine  (ATARAX/VISTARIL) 50 MG tablet Take 50 mg by mouth 3 (three) times daily.     . Paliperidone (INVEGA PO) Take 1 capsule by mouth daily.    Marland Kitchen PARoxetine (PAXIL) 40 MG tablet Take 40 mg by mouth every morning.     Marland Kitchen QUEtiapine (SEROQUEL) 25 MG tablet Take 25 mg by mouth as needed.    . simvastatin (ZOCOR) 20 MG tablet Take 1 tablet (20 mg total) by mouth daily. 90 tablet 1  . tiZANidine (ZANAFLEX) 2 MG tablet Take 1 tablet by mouth 3 (three) times daily.     No current facility-administered medications for this visit.     Allergies  Allergen Reactions  . Effexor [Venlafaxine] Other (See Comments)    PT DOES SIDE EFFECTS  . Lamictal [Lamotrigine] Rash    Family History  Problem Relation Age of Onset  . Heart disease Father   . Hyperlipidemia Father   . Alcohol abuse Brother   . Alcohol abuse Paternal Uncle   . Colon cancer Neg Hx     Social History   Social History  . Marital status: Single    Spouse name: N/A  . Number of children: N/A  . Years of education: N/A   Occupational History  . Not on file.   Social History Main Topics  .  Smoking status: Former Smoker    Packs/day: 1.00    Years: 1.00    Types: Cigarettes  . Smokeless tobacco: Never Used  . Alcohol use No     Comment: quit 20 years  . Drug use:     Types: Marijuana     Comment: occasional  . Sexual activity: Not Currently   Other Topics Concern  . Not on file   Social History Narrative  . No narrative on file     Constitutional: Denies fever, malaise, fatigue, headache or abrupt weight changes.  HEENT: Pt reports facial pain and runny nose. Denies eye pain, eye redness, ear pain, ringing in the ears, wax buildup, nasal congestion, bloody nose, or sore throat. Respiratory: Pt reports cough. Denies difficulty breathing, shortness of breath.   Cardiovascular: Denies chest pain, chest tightness, palpitations or swelling in the hands or feet.  Gastrointestinal: Denies abdominal pain, bloating,  constipation, diarrhea or blood in the stool.  GU: Denies urgency, frequency, pain with urination, burning sensation, blood in urine, odor or discharge. Musculoskeletal: Pt reports low back pain. Denies decrease in range of motion, difficulty with gait, or joint swelling.    No other specific complaints in a complete review of systems (except as listed in HPI above).  Objective:   Physical Exam   BP 110/76   Pulse 85   Temp 97.9 F (36.6 C) (Oral)   Wt 151 lb 12 oz (68.8 kg)   SpO2 98%   BMI 28.67 kg/m  Wt Readings from Last 3 Encounters:  12/18/15 151 lb 12 oz (68.8 kg)  11/26/15 144 lb (65.3 kg)  11/21/15 148 lb (67.1 kg)    General: Appears her stated age, well developed, well nourished in NAD. HEENT: Head: normal shape and size, no sinus tenderness noted; Eyes: sclera white, no icterus, conjunctiva pink, PERRLA and EOMs intact; Ears: Tm's gray and intact, normal light reflex; Nose: mucosa pink and moist, septum midline; Throat/Mouth: Teeth present, mucosa pink and moist, no exudate, lesions or ulcerations noted.  Neck:  No adenopathy noted. Cardiovascular: Normal rate and rhythm. S1,S2 noted.  No murmur, rubs or gallops noted.  Pulmonary/Chest: Normal effort. Scattered rhonchi throughout and bilateral expiratory wheezing. Abdomen: Soft and nontender.  Musculoskeletal: Normal flexion, extension and rotation of the spine. No bony tenderness noted of lumbar spine. Pain with palpation of the left paralumbar muscles. Strength 5/5 BLE. No difficulty with gait.  Neurological: Alert and oriented. Sensation intact to BLE.  BMET    Component Value Date/Time   NA 133 (L) 03/15/2015 1507   K 4.5 03/29/2015 1423   CL 100 03/15/2015 1507   CO2 31 03/15/2015 1507   GLUCOSE 94 03/15/2015 1507   BUN 10 03/15/2015 1507   CREATININE 1.05 03/15/2015 1507   CALCIUM 9.9 03/15/2015 1507    Lipid Panel     Component Value Date/Time   CHOL 169 03/15/2015 1507   TRIG 105.0 03/15/2015  1507   HDL 54.30 03/15/2015 1507   CHOLHDL 3 03/15/2015 1507   VLDL 21.0 03/15/2015 1507   LDLCALC 93 03/15/2015 1507    CBC    Component Value Date/Time   WBC 8.8 03/15/2015 1507   RBC 4.38 03/15/2015 1507   HGB 13.8 03/15/2015 1507   HCT 41.3 03/15/2015 1507   PLT 399.0 03/15/2015 1507   MCV 94.3 03/15/2015 1507   MCHC 33.3 03/15/2015 1507   RDW 14.5 03/15/2015 1507    Hgb A1C No results found for: HGBA1C  Assessment & Plan:   Muscle strain of lower back:  Stretching exercises given Advised her to try Ibuprofen and a heating pad She will continue Zanaflex for now No more heavy lifting x 2 weeks  Cough:  Ongoing x 1 month despite Prednisone and ABX Will check chest xray today Will let you know if you need additional antibiotics RX for Hycodan for cough  Will follow up after xray, RTC as needed Webb Silversmith, NP

## 2015-12-18 NOTE — Patient Instructions (Signed)

## 2015-12-19 NOTE — Addendum Note (Signed)
Addended by: Lurlean Nanny on: 12/19/2015 08:32 AM   Modules accepted: Orders

## 2015-12-21 DIAGNOSIS — Z9889 Other specified postprocedural states: Secondary | ICD-10-CM | POA: Diagnosis not present

## 2015-12-21 DIAGNOSIS — M1712 Unilateral primary osteoarthritis, left knee: Secondary | ICD-10-CM | POA: Diagnosis not present

## 2016-01-02 ENCOUNTER — Ambulatory Visit (INDEPENDENT_AMBULATORY_CARE_PROVIDER_SITE_OTHER): Payer: Medicare Other | Admitting: Internal Medicine

## 2016-01-02 ENCOUNTER — Encounter: Payer: Self-pay | Admitting: Internal Medicine

## 2016-01-02 ENCOUNTER — Ambulatory Visit (INDEPENDENT_AMBULATORY_CARE_PROVIDER_SITE_OTHER)
Admission: RE | Admit: 2016-01-02 | Discharge: 2016-01-02 | Disposition: A | Discharge status: Home / Self Care | Payer: Medicare Other | Source: Ambulatory Visit | Attending: Internal Medicine | Admitting: Internal Medicine

## 2016-01-02 VITALS — BP 128/80 | HR 75 | Temp 97.6°F | Wt 149.5 lb

## 2016-01-02 DIAGNOSIS — M5442 Lumbago with sciatica, left side: Secondary | ICD-10-CM

## 2016-01-02 DIAGNOSIS — M5136 Other intervertebral disc degeneration, lumbar region: Secondary | ICD-10-CM | POA: Diagnosis not present

## 2016-01-02 DIAGNOSIS — R0602 Shortness of breath: Secondary | ICD-10-CM | POA: Diagnosis not present

## 2016-01-02 DIAGNOSIS — R05 Cough: Secondary | ICD-10-CM | POA: Diagnosis not present

## 2016-01-02 DIAGNOSIS — R059 Cough, unspecified: Secondary | ICD-10-CM

## 2016-01-02 MED ORDER — HYDROCODONE-ACETAMINOPHEN 5-325 MG PO TABS: 1.0000 | ORAL_TABLET | Freq: Two times a day (BID) | ORAL | 0 refills | Status: DC | PRN

## 2016-01-02 NOTE — Patient Instructions (Signed)

## 2016-01-02 NOTE — Progress Notes (Signed)
Subjective:    Patient ID: [name], female    DOB: [birth year], 50 y.o.   MRN: [id number]  HPI  Pt presents to the clinic today to follow up low back pain. This started 3 weeks ago. The pain radiates down her left leg to the tip of her toes. She describes the pain as sharp and stabbing. She now has some numbness in her left leg but no tingling. The pain seems worse with laying down or moving from a sitting to a standing position. 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 She did not get any relief from the Pred Taper she recently took for her bronchitis. She has been taking Norco without relief, Zanaflex (3 tabs at one time), Etodolac and THC, and nothing has helped. She denies loss of bowel or bladder.  She also wants to follow up on her cough and shortness of breath. She was seen 10/17 for the same, diagnosed with acute sinusitis/bronchitis treated with Prednisone, Augmentin and Hycodan but reports this has not helped. She continues to have a dry cough, wheezing and mildly short of breath. She denies runny nose, nasal congestion, sore throat. She denies fever, chills or body aches. She has not tried anything additional OTC.  Review of Systems      Past Medical History:  Diagnosis Date  . Anxiety   . Arthritis   . Depression   . History of lump of left breast   . Hyperlipidemia   . Personality disorder   . Post traumatic stress disorder (PTSD)   . Substance abuse     Current Outpatient Prescriptions  Medication Sig Dispense Refill  . albuterol (PROVENTIL HFA;VENTOLIN HFA) 108 (90 Base) MCG/ACT inhaler Inhale 2 puffs into the lungs every 4 (four) hours as needed for wheezing or shortness of breath. 1 Inhaler 1  . buPROPion (WELLBUTRIN XL) 300 MG 24 hr tablet Take 1 tablet (300 mg total) by mouth daily. 90 tablet 1  . etodolac (LODINE) 400 MG tablet Take 400 mg by mouth 2 (two) times daily.     Marland Kitchen gabapentin (NEURONTIN) 100 MG capsule  Take 100 mg by mouth 4 (four) times daily.     Marland Kitchen HYDROcodone-homatropine (HYCODAN) 5-1.5 MG/5ML syrup Take 5 mLs by mouth every 8 (eight) hours as needed for cough. 120 mL 0  . hydrOXYzine (ATARAX/VISTARIL) 50 MG tablet Take 50 mg by mouth 3 (three) times daily.     . Paliperidone (INVEGA PO) Take 1 capsule by mouth daily.    Marland Kitchen PARoxetine (PAXIL) 40 MG tablet Take 40 mg by mouth every morning.     Marland Kitchen QUEtiapine (SEROQUEL) 25 MG tablet Take 25 mg by mouth as needed.    . simvastatin (ZOCOR) 20 MG tablet Take 1 tablet (20 mg total) by mouth daily. 90 tablet 1  . tiZANidine (ZANAFLEX) 2 MG tablet Take 1 tablet by mouth 3 (three) times daily.    Marland Kitchen HYDROcodone-acetaminophen (NORCO/VICODIN) 5-325 MG tablet Take 1 tablet by mouth every 12 (twelve) hours as needed for moderate pain. 10 tablet 0   No current facility-administered medications for this visit.     Allergies  Allergen Reactions  . Effexor [Venlafaxine] Other (See Comments)    PT DOES SIDE EFFECTS  . Lamictal [Lamotrigine] Rash    Family History  Problem Relation Age of Onset  . Heart disease Father   . Hyperlipidemia Father   . Alcohol abuse Brother   . Alcohol abuse  Paternal Uncle   . Colon cancer Neg Hx     Social History   Social History  . Marital status: Single    Spouse name: N/A  . Number of children: N/A  . Years of education: N/A   Occupational History  . Not on file.   Social History Main Topics  . Smoking status: Former Smoker    Packs/day: 1.00    Years: 1.00    Types: Cigarettes  . Smokeless tobacco: Never Used  . Alcohol use No     Comment: quit 20 years  . Drug use:     Types: Marijuana     Comment: occasional  . Sexual activity: Not Currently   Other Topics Concern  . Not on file   Social History Narrative  . No narrative on file     Constitutional: Denies fever, malaise, fatigue, headache or abrupt weight changes.  HEENT: Denies eye pain, eye redness, ear pain, ringing in the ears, wax  buildup, runny nose, nasal congestion, bloody nose, or sore throat. Respiratory: Pt reports cough and shortness of breath. Denies difficulty breathing, or sputum production.   Cardiovascular: Denies chest pain, chest tightness, palpitations or swelling in the hands or feet.  Musculoskeletal: Pt reports low back pain. Denies difficulty with gait, or joint swelling.  Skin: Denies redness, rashes, lesions or ulcercations.  Neurological: Pt reports numbness of left leg. Denies dizziness, difficulty with memory, difficulty with speech or problems with balance and coordination.    No other specific complaints in a complete review of systems (except as listed in HPI above).  Objective:   Physical Exam  BP 128/80   Pulse 75   Temp 97.6 F (36.4 C) (Oral)   Wt 149 lb 8 oz (67.8 kg)   SpO2 96%   BMI 28.25 kg/m  Wt Readings from Last 3 Encounters:  01/02/16 149 lb 8 oz (67.8 kg)  12/18/15 151 lb 12 oz (68.8 kg)  11/26/15 144 lb (65.3 kg)    General: Appears her stated age, well developed, well nourished in NAD. HEENT:  Throat/Mouth: Teeth present, mucosa pink and moist, no exudate, lesions or ulcerations noted.  Neck:  No adenopathy noted.  Cardiovascular: Normal rate and rhythm. S1,S2 noted.  No murmur, rubs or gallops noted.  Pulmonary/Chest: Normal effort and positive vesicular breath sounds. No respiratory distress. No wheezes, rales or ronchi noted.  Musculoskeletal: Decreased flexion and extension of the spine. Normal rotation and lateral bending. Pain with palpation over the lumber spine. Strength 5/5 BLE.  No difficulty with gait.  Neurological: Alert and oriented. Sensation intact to BLE.  BMET    Component Value Date/Time   NA 133 (L) 03/15/2015 1507   K 4.5 03/29/2015 1423   CL 100 03/15/2015 1507   CO2 31 03/15/2015 1507   GLUCOSE 94 03/15/2015 1507   BUN 10 03/15/2015 1507   CREATININE 1.05 03/15/2015 1507   CALCIUM 9.9 03/15/2015 1507    Lipid Panel     Component  Value Date/Time   CHOL 169 03/15/2015 1507   TRIG 105.0 03/15/2015 1507   HDL 54.30 03/15/2015 1507   CHOLHDL 3 03/15/2015 1507   VLDL 21.0 03/15/2015 1507   LDLCALC 93 03/15/2015 1507    CBC    Component Value Date/Time   WBC 8.8 03/15/2015 1507   RBC 4.38 03/15/2015 1507   HGB 13.8 03/15/2015 1507   HCT 41.3 03/15/2015 1507   PLT 399.0 03/15/2015 1507   MCV 94.3 03/15/2015 1507  MCHC 33.3 03/15/2015 1507   RDW 14.5 03/15/2015 1507    Hgb A1C No results found for: HGBA1C      Assessment & Plan:   Cough and SOB:  Clinically improved No further intervention needed  Low back pain with left side sciatica:  Xray of lumbar spine today Prednisone, Zanaflex, Etodolac and THC have not helped rX for Norco 5325 mg, # 10 0 refills Pending xray, may need MRI vs PT  Will follow up after xray, RTC as needed Webb Silversmith, NP

## 2016-01-04 ENCOUNTER — Ambulatory Visit (HOSPITAL_BASED_OUTPATIENT_CLINIC_OR_DEPARTMENT_OTHER): Payer: Medicare Other | Attending: Pulmonary Disease | Admitting: Pulmonary Disease

## 2016-01-04 ENCOUNTER — Other Ambulatory Visit: Payer: Self-pay | Admitting: Internal Medicine

## 2016-01-04 DIAGNOSIS — M5442 Lumbago with sciatica, left side: Secondary | ICD-10-CM

## 2016-01-04 DIAGNOSIS — Z79899 Other long term (current) drug therapy: Secondary | ICD-10-CM | POA: Insufficient documentation

## 2016-01-04 DIAGNOSIS — G4719 Other hypersomnia: Secondary | ICD-10-CM | POA: Insufficient documentation

## 2016-01-04 DIAGNOSIS — R0683 Snoring: Secondary | ICD-10-CM | POA: Diagnosis not present

## 2016-01-04 DIAGNOSIS — G4733 Obstructive sleep apnea (adult) (pediatric): Secondary | ICD-10-CM | POA: Diagnosis not present

## 2016-01-07 ENCOUNTER — Other Ambulatory Visit: Payer: Self-pay

## 2016-01-07 MED ORDER — HYDROCODONE-ACETAMINOPHEN 5-325 MG PO TABS: 1.0000 | ORAL_TABLET | Freq: Two times a day (BID) | ORAL | 0 refills | Status: DC | PRN

## 2016-01-07 NOTE — Telephone Encounter (Signed)
Pt left v/m requesting rx hydrocodone apap. Call when ready for pick up. Pt last seen and rx last printed # 10 on 01/02/16. Pt continues with back pain and has not had MRI yet. Avie Echevaria NP out of office.Please advise.

## 2016-01-07 NOTE — Telephone Encounter (Signed)
Px printed for pick up in IN box Will refill once in PCP absence  Will cc to [facility]

## 2016-01-08 ENCOUNTER — Telehealth: Payer: Self-pay | Admitting: Pulmonary Disease

## 2016-01-08 DIAGNOSIS — G4719 Other hypersomnia: Secondary | ICD-10-CM

## 2016-01-08 DIAGNOSIS — G4733 Obstructive sleep apnea (adult) (pediatric): Secondary | ICD-10-CM | POA: Diagnosis not present

## 2016-01-08 NOTE — Procedures (Signed)
NAME: [name] DATE OF BIRTH:  [birth year] MEDICAL RECORD NUMBER [id number]  LOCATION: [facility]  PHYSICIAN: Aldora  DATE OF STUDY: 01/04/2016  CLINICAL INFORMATION  Sleep Study Type: Split Night CPAP  Indication for sleep study: Excessive Daytime Sleepiness  Epworth Sleepiness Score:   SLEEP STUDY TECHNIQUE  As per the AASM Manual for the Scoring of Sleep and Associated Events v2.3 (April 2016) with a hypopnea requiring 4% desaturations.  The channels recorded and monitored were frontal, central and occipital EEG, electrooculogram (EOG), submentalis EMG (chin), nasal and oral airflow, thoracic and abdominal wall motion, anterior tibialis EMG, snore microphone, electrocardiogram, and pulse oximetry. Continuous positive airway pressure (CPAP) was initiated when the patient met split night criteria and was titrated according to treat sleep-disordered breathing.  MEDICATIONS  Medications self-administered by patient taken the night of the study : SEROQUEL, HYDROCODONE   RESPIRATORY PARAMETERS  Diagnostic Total AHI (/hr):  36.4 RDI (/hr): 36.9 OA Index (/hr):  2.3 CA Index (/hr):  0.0  REM AHI (/hr):  N/A NREM AHI (/hr):  36.4 Supine AHI (/hr):  69.5 Non-supine AHI (/hr):  0.96  Min O2 Sat (%): 80.00 Mean O2 (%): 89.60 Time below 88% (min): 50.8    Titration Optimal Pressure (cm): 13 AHI at Optimal Pressure (/hr): 0.0 Min O2 at Optimal Pressure (%): 90.0  Supine % at Optimal (%): 0 Sleep % at Optimal (%): 100    SLEEP ARCHITECTURE  The recording time for the entire night was 406.4 minutes. During a baseline period of 135.9 minutes, the patient slept for 130.1 minutes in REM and nonREM, yielding a sleep efficiency of 95.8%. Sleep onset after lights out was 1.8 minutes with a REM latency of N/A minutes. The patient spent 2.31% of the night in stage N1 sleep, 97.69% in stage N2 sleep, 0.00% in stage N3 and 0.00% in REM.  During the titration  period of 262.9 minutes, the patient slept for 256.6 minutes in REM and nonREM, yielding a sleep efficiency of 97.6%. Sleep onset after CPAP initiation was 0.3 minutes with a REM latency of 119.5 minutes. The patient spent 0.58% of the night in stage N1 sleep, 83.83% in stage N2 sleep, 0.19% in stage N3 and 15.39% in REM.  CARDIAC DATA  The 2 lead EKG demonstrated sinus rhythm. The mean heart rate was 75.71 beats per minute. Other EKG findings include: None.   LEG MOVEMENT DATA  The total Periodic Limb Movements of Sleep (PLMS) were 0. The PLMS index was 0.00 .  IMPRESSIONS  1. Severe obstructive sleep apnea occurred during the diagnostic portion of the study (AHI = 36.4/hour). An optimal PAP pressure was selected for this patient ( 13 cm of water) 2. No significant central sleep apnea occurred during the diagnostic portion of the study (CAI = 0.0/hour). 3. Mild oxygen desaturation was noted during the diagnostic portion of the study (Min O2 = 80.00%). 4. The patient snored with Moderate snoring volume during the diagnostic portion of the study. 5. No cardiac abnormalities were noted during this study. 6. Clinically significant periodic limb movements did not occur during sleep.   DIAGNOSIS  Obstructive Sleep Apnea (327.23 [G47.33 ICD-10])   RECOMMENDATIONS  1. Trial of CPAP therapy on 13 cm H2O with a Small size Fisher&Paykel Full Face Mask Simplus mask and heated humidification. 2. Avoid alcohol, sedatives and other CNS depressants that may worsen sleep apnea and disrupt normal sleep architecture. 3. Sleep hygiene should  be reviewed to assess factors that may improve sleep quality. 4. Weight management and regular exercise should be initiated or continued. 5. Follow up in the office 4-6 weeks after obtaining cpap machine.  Monica Becton, MD 01/08/2016, 9:03 AM [facility] Pager [phone] After 3 pm or if no answer, call [phone]

## 2016-01-08 NOTE — Telephone Encounter (Signed)
Left voicemail letting pt know Rx ready for pick up 

## 2016-01-08 NOTE — Telephone Encounter (Signed)
  Please call the pt and tell the pt the [place]  showed OSA. Marland Kitchen   Pt stops breathing 36   times an hour.    Please order an autocpap machine set at 13 cm water.   Patient will need a mask fitting session to determine best mask fit.  Patient will need a 1 month download.   Patient needs to be seen by me or any of the NPs/APPs  4-6 weeks after obtaining the cpap machine. Let me know if you receive this.   Thanks!   J. Shirl Harris, MD 01/08/2016, 9:05 AM

## 2016-01-09 ENCOUNTER — Telehealth: Payer: Self-pay

## 2016-01-09 ENCOUNTER — Ambulatory Visit (HOSPITAL_COMMUNITY): Admission: RE | Admit: 2016-01-09 | Payer: Medicare Other | Source: Ambulatory Visit

## 2016-01-09 ENCOUNTER — Encounter: Payer: Self-pay | Admitting: Pulmonary Disease

## 2016-01-09 DIAGNOSIS — G4733 Obstructive sleep apnea (adult) (pediatric): Secondary | ICD-10-CM

## 2016-01-09 HISTORY — DX: Obstructive sleep apnea (adult) (pediatric): G47.33

## 2016-01-09 NOTE — Telephone Encounter (Signed)
Pt left v/m; pt was scheduled for MRI today but because pt was unable to lay still and back was hurting pt was unable to have MRI done and MRI rescheduled for 01/13/16; Pt wants to know if med to help her relax prior to MRI could be sent to [facility]. Pt request cb. Avie Echevaria NP out of office.

## 2016-01-09 NOTE — Telephone Encounter (Signed)
[name] is here this afternoon

## 2016-01-09 NOTE — Telephone Encounter (Signed)
Spoke with pt and notified of results per Dr. Corrie Dandy Pt verbalized understanding and denied any questions. I have sent order to [facility]  I advised her she needs to call for ov with AD or NP for 4-6 wks once she finally gets her CPAP machine  I advised her ins will require this  She verbalized understanding of this

## 2016-01-09 NOTE — Telephone Encounter (Signed)
Ok to phone in Valium 2 mg, take 1 tab PO 30 minutes prior to MRI, #1, 0 refills

## 2016-01-09 NOTE — Telephone Encounter (Signed)
I think Rollene Fare will be back later today, correct? I will forward to her  If I am wrong send it back to me please

## 2016-01-10 MED ORDER — DIAZEPAM 2 MG PO TABS: ORAL_TABLET | ORAL | 0 refills | Status: DC

## 2016-01-10 NOTE — Telephone Encounter (Signed)
Please call patient at [phone].

## 2016-01-10 NOTE — Telephone Encounter (Signed)
Rx called in to pharmacy. 

## 2016-01-10 NOTE — Addendum Note (Signed)
Addended by: Lurlean Nanny on: 01/10/2016 02:36 PM   Modules accepted: Orders

## 2016-01-11 ENCOUNTER — Telehealth: Payer: Self-pay | Admitting: Internal Medicine

## 2016-01-11 DIAGNOSIS — M545 Low back pain: Secondary | ICD-10-CM

## 2016-01-11 MED ORDER — HYDROCODONE-ACETAMINOPHEN 5-325 MG PO TABS: 1.0000 | ORAL_TABLET | Freq: Two times a day (BID) | ORAL | 0 refills | Status: DC | PRN

## 2016-01-11 NOTE — Telephone Encounter (Signed)
Spoke to pt. Let her know valium was called in for her MRI.   Pt requesting hydrocodone refill. She will run out on Sunday 01/13/16. Pt states her pain stays at a 10.  Pt would also like call back with xray results. She would like to know what abnormalities were found.   Pt would like call back today: [place] (pt states pharm closes at 12pm on Saturdays)   Pt aware Rollene Fare and Threasa Beards out of office today. Will route to Allie Bossier, NP.

## 2016-01-11 NOTE — Telephone Encounter (Signed)
Spoken and notified patient of Kate's comments. Patient verbalized understanding.  Rx left in the front office for patient to pick up.

## 2016-01-11 NOTE — Telephone Encounter (Signed)
Will authorize 1 refill of hydrocodone, if this doesn't work then I don't see it necessary to continue refilling.  Refill ready for pick up. Based off of the result note, her xray was reviewed with her on 11/01. Looks like she's scheduled for MRI.

## 2016-01-13 ENCOUNTER — Ambulatory Visit (HOSPITAL_COMMUNITY)
Admission: RE | Admit: 2016-01-13 | Discharge: 2016-01-13 | Disposition: A | Discharge status: Home / Self Care | Payer: Medicare Other | Source: Ambulatory Visit | Attending: Internal Medicine | Admitting: Internal Medicine

## 2016-01-13 DIAGNOSIS — M7138 Other bursal cyst, other site: Secondary | ICD-10-CM | POA: Diagnosis not present

## 2016-01-13 DIAGNOSIS — M545 Low back pain: Secondary | ICD-10-CM | POA: Diagnosis not present

## 2016-01-13 DIAGNOSIS — M5442 Lumbago with sciatica, left side: Secondary | ICD-10-CM

## 2016-01-13 DIAGNOSIS — M5126 Other intervertebral disc displacement, lumbar region: Secondary | ICD-10-CM | POA: Insufficient documentation

## 2016-01-13 DIAGNOSIS — M5136 Other intervertebral disc degeneration, lumbar region: Secondary | ICD-10-CM | POA: Diagnosis not present

## 2016-01-14 ENCOUNTER — Other Ambulatory Visit: Payer: Self-pay | Admitting: Internal Medicine

## 2016-01-14 DIAGNOSIS — M5442 Lumbago with sciatica, left side: Secondary | ICD-10-CM

## 2016-01-16 ENCOUNTER — Other Ambulatory Visit: Payer: Self-pay | Admitting: Neurosurgery

## 2016-01-16 ENCOUNTER — Ambulatory Visit: Payer: Medicare Other | Admitting: Pulmonary Disease

## 2016-01-16 DIAGNOSIS — M7138 Other bursal cyst, other site: Secondary | ICD-10-CM | POA: Insufficient documentation

## 2016-01-16 DIAGNOSIS — Z6828 Body mass index (BMI) 28.0-28.9, adult: Secondary | ICD-10-CM | POA: Diagnosis not present

## 2016-01-21 ENCOUNTER — Encounter (HOSPITAL_COMMUNITY): Payer: Self-pay | Admitting: *Deleted

## 2016-01-21 NOTE — Progress Notes (Signed)
Pt denies SOB, chest pain, and being under the care of a cardiologist. Pt denies having a stress test, echo and cardiac cath, Pt denies having an EKG within the last year. Pt made aware to stop taking  Aspirin, vitamins, fish oil, and herbal medications. Do not take any NSAIDs ie: Ibuprofen, Advil, Naproxen, BC and Goody powder or any medication containing Aspirin such as Lodine. Spoke with Dr. Tobias Alexander, Anesthesia regarding pt am medications; okay to take all am medications per MD. Pt verbalized understanding of all pre-op instructions.

## 2016-01-22 ENCOUNTER — Inpatient Hospital Stay (HOSPITAL_COMMUNITY): Payer: Medicare Other | Admitting: Anesthesiology

## 2016-01-22 ENCOUNTER — Ambulatory Visit (HOSPITAL_COMMUNITY)
Admission: RE | Admit: 2016-01-22 | Discharge: 2016-01-22 | Disposition: A | Discharge status: Home / Self Care | Payer: Medicare Other | Source: Ambulatory Visit | Attending: Neurosurgery | Admitting: Neurosurgery

## 2016-01-22 ENCOUNTER — Inpatient Hospital Stay (HOSPITAL_COMMUNITY): Payer: Medicare Other

## 2016-01-22 ENCOUNTER — Encounter (HOSPITAL_COMMUNITY)
Admission: RE | Disposition: A | Discharge status: Home / Self Care | Payer: Self-pay | Source: Ambulatory Visit | Attending: Neurosurgery

## 2016-01-22 ENCOUNTER — Encounter (HOSPITAL_COMMUNITY): Payer: Self-pay | Admitting: Anesthesiology

## 2016-01-22 DIAGNOSIS — E785 Hyperlipidemia, unspecified: Secondary | ICD-10-CM | POA: Diagnosis not present

## 2016-01-22 DIAGNOSIS — K219 Gastro-esophageal reflux disease without esophagitis: Secondary | ICD-10-CM | POA: Diagnosis not present

## 2016-01-22 DIAGNOSIS — F609 Personality disorder, unspecified: Secondary | ICD-10-CM | POA: Diagnosis not present

## 2016-01-22 DIAGNOSIS — G4733 Obstructive sleep apnea (adult) (pediatric): Secondary | ICD-10-CM | POA: Insufficient documentation

## 2016-01-22 DIAGNOSIS — M7130 Other bursal cyst, unspecified site: Secondary | ICD-10-CM

## 2016-01-22 DIAGNOSIS — Z87891 Personal history of nicotine dependence: Secondary | ICD-10-CM | POA: Diagnosis not present

## 2016-01-22 DIAGNOSIS — F319 Bipolar disorder, unspecified: Secondary | ICD-10-CM | POA: Diagnosis not present

## 2016-01-22 DIAGNOSIS — M7138 Other bursal cyst, other site: Secondary | ICD-10-CM | POA: Diagnosis not present

## 2016-01-22 DIAGNOSIS — M5416 Radiculopathy, lumbar region: Principal | ICD-10-CM | POA: Insufficient documentation

## 2016-01-22 DIAGNOSIS — M4317 Spondylolisthesis, lumbosacral region: Secondary | ICD-10-CM | POA: Diagnosis not present

## 2016-01-22 DIAGNOSIS — Z981 Arthrodesis status: Secondary | ICD-10-CM | POA: Diagnosis not present

## 2016-01-22 DIAGNOSIS — F4310 Post-traumatic stress disorder, unspecified: Secondary | ICD-10-CM | POA: Diagnosis not present

## 2016-01-22 DIAGNOSIS — F419 Anxiety disorder, unspecified: Secondary | ICD-10-CM | POA: Insufficient documentation

## 2016-01-22 DIAGNOSIS — Z888 Allergy status to other drugs, medicaments and biological substances status: Secondary | ICD-10-CM | POA: Diagnosis not present

## 2016-01-22 DIAGNOSIS — M1990 Unspecified osteoarthritis, unspecified site: Secondary | ICD-10-CM | POA: Insufficient documentation

## 2016-01-22 HISTORY — DX: Gastro-esophageal reflux disease without esophagitis: K21.9

## 2016-01-22 HISTORY — PX: LUMBAR LAMINECTOMY/DECOMPRESSION MICRODISCECTOMY: SHX5026

## 2016-01-22 HISTORY — DX: Nausea with vomiting, unspecified: R11.2

## 2016-01-22 HISTORY — DX: Unspecified asthma, uncomplicated: J45.909

## 2016-01-22 HISTORY — DX: Other specified postprocedural states: Z98.890

## 2016-01-22 HISTORY — DX: Bipolar disorder, unspecified: F31.9

## 2016-01-22 HISTORY — DX: Other bursal cyst, unspecified site: M71.30

## 2016-01-22 LAB — CBC WITH DIFFERENTIAL/PLATELET
BASOS ABS: 0 10*3/uL (ref 0.0–0.1)
Basophils Relative: 0 %
Eosinophils Absolute: 0.3 10*3/uL (ref 0.0–0.7)
Eosinophils Relative: 4 %
HEMATOCRIT: 40.8 % (ref 36.0–46.0)
Hemoglobin: 14.4 g/dL (ref 12.0–15.0)
LYMPHS ABS: 2.1 10*3/uL (ref 0.7–4.0)
LYMPHS PCT: 26 %
MCH: 31.7 pg (ref 26.0–34.0)
MCHC: 35.3 g/dL (ref 30.0–36.0)
MCV: 89.9 fL (ref 78.0–100.0)
MONO ABS: 0.6 10*3/uL (ref 0.1–1.0)
Monocytes Relative: 8 %
NEUTROS ABS: 4.8 10*3/uL (ref 1.7–7.7)
Neutrophils Relative %: 62 %
Platelets: 332 10*3/uL (ref 150–400)
RBC: 4.54 MIL/uL (ref 3.87–5.11)
RDW: 15.1 % (ref 11.5–15.5)
WBC: 7.8 10*3/uL (ref 4.0–10.5)

## 2016-01-22 LAB — BASIC METABOLIC PANEL
ANION GAP: 9 (ref 5–15)
BUN: 8 mg/dL (ref 6–20)
CHLORIDE: 102 mmol/L (ref 101–111)
CO2: 25 mmol/L (ref 22–32)
Calcium: 9.7 mg/dL (ref 8.9–10.3)
Creatinine, Ser: 0.89 mg/dL (ref 0.44–1.00)
GFR calc Af Amer: >60 mL/min (ref >60)
GLUCOSE: 77 mg/dL (ref 65–99)
POTASSIUM: 4.4 mmol/L (ref 3.5–5.1)
Sodium: 136 mmol/L (ref 135–145)

## 2016-01-22 SURGERY — LUMBAR LAMINECTOMY/DECOMPRESSION MICRODISCECTOMY 1 LEVEL
Anesthesia: General | Site: Back | Laterality: Left

## 2016-01-22 MED ORDER — HYDROXYZINE HCL 25 MG PO TABS
50.0000 mg | ORAL_TABLET | Freq: Three times a day (TID) | ORAL | Status: DC
  Filled 2016-01-22: qty 2

## 2016-01-22 MED ORDER — SUGAMMADEX SODIUM 200 MG/2ML IV SOLN
INTRAVENOUS | Status: DC | PRN
  Administered 2016-01-22: 150 mg via INTRAVENOUS

## 2016-01-22 MED ORDER — TIZANIDINE HCL 4 MG PO TABS
2.0000 mg | ORAL_TABLET | Freq: Three times a day (TID) | ORAL | Status: DC
  Administered 2016-01-22: 2 mg via ORAL
  Filled 2016-01-22: qty 1

## 2016-01-22 MED ORDER — HEMOSTATIC AGENTS (NO CHARGE) OPTIME
TOPICAL | Status: DC | PRN
  Administered 2016-01-22: 1 via TOPICAL

## 2016-01-22 MED ORDER — ROCURONIUM BROMIDE 100 MG/10ML IV SOLN
INTRAVENOUS | Status: DC | PRN
  Administered 2016-01-22: 40 mg via INTRAVENOUS

## 2016-01-22 MED ORDER — HYDROMORPHONE HCL 1 MG/ML IJ SOLN
0.5000 mg | INTRAMUSCULAR | Status: DC | PRN
  Administered 2016-01-22: 1 mg via INTRAVENOUS
  Filled 2016-01-22: qty 1

## 2016-01-22 MED ORDER — MIDAZOLAM HCL 2 MG/2ML IJ SOLN
INTRAMUSCULAR | Status: AC
  Filled 2016-01-22: qty 2

## 2016-01-22 MED ORDER — ALBUTEROL SULFATE HFA 108 (90 BASE) MCG/ACT IN AERS
INHALATION_SPRAY | RESPIRATORY_TRACT | Status: DC | PRN
  Administered 2016-01-22: 2 via RESPIRATORY_TRACT

## 2016-01-22 MED ORDER — ACETAMINOPHEN 325 MG PO TABS: 650.0000 mg | ORAL_TABLET | ORAL | Status: DC | PRN

## 2016-01-22 MED ORDER — TIZANIDINE HCL 2 MG PO TABS: 2.0000 mg | ORAL_TABLET | Freq: Three times a day (TID) | ORAL | 0 refills | Status: DC

## 2016-01-22 MED ORDER — SIMVASTATIN 20 MG PO TABS: 20.0000 mg | ORAL_TABLET | Freq: Every day | ORAL | Status: DC

## 2016-01-22 MED ORDER — LACTATED RINGERS IV SOLN
INTRAVENOUS | Status: DC
  Administered 2016-01-22 (×2): via INTRAVENOUS

## 2016-01-22 MED ORDER — THROMBIN 5000 UNITS EX SOLR
CUTANEOUS | Status: DC | PRN
  Administered 2016-01-22: 10000 [IU] via TOPICAL

## 2016-01-22 MED ORDER — EPHEDRINE SULFATE 50 MG/ML IJ SOLN
INTRAMUSCULAR | Status: DC | PRN
  Administered 2016-01-22: 5 mg via INTRAVENOUS

## 2016-01-22 MED ORDER — MIDAZOLAM HCL 5 MG/5ML IJ SOLN
INTRAMUSCULAR | Status: DC | PRN
  Administered 2016-01-22: 2 mg via INTRAVENOUS

## 2016-01-22 MED ORDER — MENTHOL 3 MG MT LOZG: 1.0000 | LOZENGE | OROMUCOSAL | Status: DC | PRN

## 2016-01-22 MED ORDER — PHENYLEPHRINE 40 MCG/ML (10ML) SYRINGE FOR IV PUSH (FOR BLOOD PRESSURE SUPPORT)
PREFILLED_SYRINGE | INTRAVENOUS | Status: AC
Start: 2016-01-22 — End: 2016-01-22
  Filled 2016-01-22: qty 10

## 2016-01-22 MED ORDER — PALIPERIDONE ER 6 MG PO TB24: 9.0000 mg | ORAL_TABLET | ORAL | Status: DC

## 2016-01-22 MED ORDER — CYCLOBENZAPRINE HCL 10 MG PO TABS: 10.0000 mg | ORAL_TABLET | Freq: Three times a day (TID) | ORAL | Status: DC | PRN

## 2016-01-22 MED ORDER — PHENYLEPHRINE 40 MCG/ML (10ML) SYRINGE FOR IV PUSH (FOR BLOOD PRESSURE SUPPORT)
PREFILLED_SYRINGE | INTRAVENOUS | Status: AC
  Filled 2016-01-22: qty 10

## 2016-01-22 MED ORDER — CEFAZOLIN SODIUM-DEXTROSE 2-4 GM/100ML-% IV SOLN
2.0000 g | INTRAVENOUS | Status: AC
  Administered 2016-01-22: 2 g via INTRAVENOUS

## 2016-01-22 MED ORDER — BUPROPION HCL ER (XL) 300 MG PO TB24: 300.0000 mg | ORAL_TABLET | Freq: Every day | ORAL | Status: DC

## 2016-01-22 MED ORDER — PROPOFOL 10 MG/ML IV BOLUS
INTRAVENOUS | Status: DC | PRN
  Administered 2016-01-22: 150 mg via INTRAVENOUS

## 2016-01-22 MED ORDER — ALBUTEROL SULFATE (2.5 MG/3ML) 0.083% IN NEBU: 3.0000 mL | INHALATION_SOLUTION | RESPIRATORY_TRACT | Status: DC | PRN

## 2016-01-22 MED ORDER — CEFAZOLIN SODIUM-DEXTROSE 2-4 GM/100ML-% IV SOLN
INTRAVENOUS | Status: AC
  Filled 2016-01-22: qty 100

## 2016-01-22 MED ORDER — LIDOCAINE 2% (20 MG/ML) 5 ML SYRINGE
INTRAMUSCULAR | Status: AC
  Filled 2016-01-22: qty 10

## 2016-01-22 MED ORDER — QUETIAPINE FUMARATE 25 MG PO TABS
25.0000 mg | ORAL_TABLET | Freq: Every day | ORAL | Status: DC | PRN
  Filled 2016-01-22: qty 1

## 2016-01-22 MED ORDER — 0.9 % SODIUM CHLORIDE (POUR BTL) OPTIME
TOPICAL | Status: DC | PRN
  Administered 2016-01-22: 1000 mL

## 2016-01-22 MED ORDER — BUPIVACAINE HCL (PF) 0.25 % IJ SOLN
INTRAMUSCULAR | Status: DC | PRN
  Administered 2016-01-22: 20 mL

## 2016-01-22 MED ORDER — PROMETHAZINE HCL 25 MG/ML IJ SOLN: 6.2500 mg | INTRAMUSCULAR | Status: DC | PRN

## 2016-01-22 MED ORDER — OXYCODONE HCL 5 MG/5ML PO SOLN: 5.0000 mg | Freq: Once | ORAL | Status: DC | PRN

## 2016-01-22 MED ORDER — ONDANSETRON HCL 4 MG/2ML IJ SOLN
INTRAMUSCULAR | Status: AC
  Filled 2016-01-22: qty 4

## 2016-01-22 MED ORDER — DEXAMETHASONE SODIUM PHOSPHATE 10 MG/ML IJ SOLN
10.0000 mg | INTRAMUSCULAR | Status: AC
  Administered 2016-01-22: 10 mg via INTRAVENOUS

## 2016-01-22 MED ORDER — KETOROLAC TROMETHAMINE 30 MG/ML IJ SOLN: 30.0000 mg | Freq: Four times a day (QID) | INTRAMUSCULAR | Status: DC

## 2016-01-22 MED ORDER — OXYCODONE-ACETAMINOPHEN 5-325 MG PO TABS
1.0000 | ORAL_TABLET | ORAL | Status: DC | PRN
  Administered 2016-01-22: 2 via ORAL
  Filled 2016-01-22: qty 2

## 2016-01-22 MED ORDER — PHENOL 1.4 % MT LIQD: 1.0000 | OROMUCOSAL | Status: DC | PRN

## 2016-01-22 MED ORDER — EPHEDRINE 5 MG/ML INJ
INTRAVENOUS | Status: AC
  Filled 2016-01-22: qty 10

## 2016-01-22 MED ORDER — FENTANYL CITRATE (PF) 100 MCG/2ML IJ SOLN: 25.0000 ug | INTRAMUSCULAR | Status: DC | PRN

## 2016-01-22 MED ORDER — ACETAMINOPHEN 10 MG/ML IV SOLN
INTRAVENOUS | Status: AC
  Filled 2016-01-22: qty 100

## 2016-01-22 MED ORDER — ACETAMINOPHEN 10 MG/ML IV SOLN
INTRAVENOUS | Status: DC | PRN
  Administered 2016-01-22: 1000 mg via INTRAVENOUS

## 2016-01-22 MED ORDER — THROMBIN 5000 UNITS EX SOLR
CUTANEOUS | Status: AC
  Filled 2016-01-22: qty 10000

## 2016-01-22 MED ORDER — ROCURONIUM BROMIDE 10 MG/ML (PF) SYRINGE
PREFILLED_SYRINGE | INTRAVENOUS | Status: AC
  Filled 2016-01-22: qty 10

## 2016-01-22 MED ORDER — ARTIFICIAL TEARS OP OINT
TOPICAL_OINTMENT | OPHTHALMIC | Status: DC | PRN
  Administered 2016-01-22: 1 via OPHTHALMIC

## 2016-01-22 MED ORDER — CHLORHEXIDINE GLUCONATE CLOTH 2 % EX PADS: 6.0000 | MEDICATED_PAD | Freq: Once | CUTANEOUS | Status: DC

## 2016-01-22 MED ORDER — OXYCODONE HCL 5 MG PO TABS: 5.0000 mg | ORAL_TABLET | Freq: Once | ORAL | Status: DC | PRN

## 2016-01-22 MED ORDER — KETOROLAC TROMETHAMINE 30 MG/ML IJ SOLN
INTRAMUSCULAR | Status: DC | PRN
  Administered 2016-01-22: 30 mg via INTRAVENOUS

## 2016-01-22 MED ORDER — ARTIFICIAL TEARS OP OINT
TOPICAL_OINTMENT | OPHTHALMIC | Status: AC
  Filled 2016-01-22: qty 7

## 2016-01-22 MED ORDER — HYDROCODONE-ACETAMINOPHEN 10-325 MG PO TABS: 1.0000 | ORAL_TABLET | ORAL | 0 refills | Status: DC | PRN

## 2016-01-22 MED ORDER — ACETAMINOPHEN 650 MG RE SUPP: 650.0000 mg | RECTAL | Status: DC | PRN

## 2016-01-22 MED ORDER — ONDANSETRON HCL 4 MG/2ML IJ SOLN
INTRAMUSCULAR | Status: DC | PRN
  Administered 2016-01-22: 4 mg via INTRAVENOUS

## 2016-01-22 MED ORDER — SUGAMMADEX SODIUM 500 MG/5ML IV SOLN
INTRAVENOUS | Status: AC
  Filled 2016-01-22: qty 5

## 2016-01-22 MED ORDER — GABAPENTIN 100 MG PO CAPS
100.0000 mg | ORAL_CAPSULE | Freq: Four times a day (QID) | ORAL | Status: DC
  Administered 2016-01-22: 100 mg via ORAL
  Filled 2016-01-22: qty 1

## 2016-01-22 MED ORDER — CEFAZOLIN IN D5W 1 GM/50ML IV SOLN: 1.0000 g | Freq: Three times a day (TID) | INTRAVENOUS | Status: DC

## 2016-01-22 MED ORDER — DEXAMETHASONE SODIUM PHOSPHATE 10 MG/ML IJ SOLN
INTRAMUSCULAR | Status: AC
  Filled 2016-01-22: qty 1

## 2016-01-22 MED ORDER — SODIUM CHLORIDE 0.9% FLUSH: 3.0000 mL | INTRAVENOUS | Status: DC | PRN

## 2016-01-22 MED ORDER — HYDROCODONE-ACETAMINOPHEN 5-325 MG PO TABS: 1.0000 | ORAL_TABLET | ORAL | Status: DC | PRN

## 2016-01-22 MED ORDER — SODIUM CHLORIDE 0.9% FLUSH: 3.0000 mL | Freq: Two times a day (BID) | INTRAVENOUS | Status: DC

## 2016-01-22 MED ORDER — SODIUM CHLORIDE 0.9 % IV SOLN: 250.0000 mL | INTRAVENOUS | Status: DC

## 2016-01-22 MED ORDER — ONDANSETRON HCL 4 MG/2ML IJ SOLN: 4.0000 mg | INTRAMUSCULAR | Status: DC | PRN

## 2016-01-22 MED ORDER — LIDOCAINE HCL (CARDIAC) 20 MG/ML IV SOLN
INTRAVENOUS | Status: DC | PRN
Start: 2016-01-22 — End: 2016-01-22
  Administered 2016-01-22: 75 mg via INTRAVENOUS

## 2016-01-22 MED ORDER — PROPOFOL 10 MG/ML IV BOLUS
INTRAVENOUS | Status: AC
  Filled 2016-01-22: qty 20

## 2016-01-22 MED ORDER — BUPIVACAINE HCL (PF) 0.25 % IJ SOLN
INTRAMUSCULAR | Status: AC
  Filled 2016-01-22: qty 30

## 2016-01-22 MED ORDER — PHENYLEPHRINE HCL 10 MG/ML IJ SOLN
INTRAMUSCULAR | Status: DC | PRN
  Administered 2016-01-22: 80 ug via INTRAVENOUS
  Administered 2016-01-22 (×2): 120 ug via INTRAVENOUS

## 2016-01-22 MED ORDER — PAROXETINE HCL 20 MG PO TABS: 40.0000 mg | ORAL_TABLET | ORAL | Status: DC

## 2016-01-22 MED ORDER — BACITRACIN 50000 UNITS IM SOLR
INTRAMUSCULAR | Status: DC | PRN
  Administered 2016-01-22: 11:00:00

## 2016-01-22 MED ORDER — FENTANYL CITRATE (PF) 100 MCG/2ML IJ SOLN
INTRAMUSCULAR | Status: AC
  Filled 2016-01-22: qty 4

## 2016-01-22 MED ORDER — FENTANYL CITRATE (PF) 100 MCG/2ML IJ SOLN
INTRAMUSCULAR | Status: DC | PRN
  Administered 2016-01-22: 100 ug via INTRAVENOUS

## 2016-01-22 MED ORDER — PHENYLEPHRINE HCL 10 MG/ML IJ SOLN
INTRAVENOUS | Status: DC | PRN
  Administered 2016-01-22: 50 ug/min via INTRAVENOUS

## 2016-01-22 SURGICAL SUPPLY — 46 items
BAG DECANTER FOR FLEXI CONT (MISCELLANEOUS) ×2 IMPLANT
BENZOIN TINCTURE PRP APPL 2/3 (GAUZE/BANDAGES/DRESSINGS) ×2 IMPLANT
BLADE CLIPPER SURG (BLADE) IMPLANT
BUR CUTTER 7.0 ROUND (BURR) ×2 IMPLANT
CANISTER SUCT 3000ML PPV (MISCELLANEOUS) ×2 IMPLANT
CARTRIDGE OIL MAESTRO DRILL (MISCELLANEOUS) ×1 IMPLANT
DECANTER SPIKE VIAL GLASS SM (MISCELLANEOUS) ×2 IMPLANT
DERMABOND ADVANCED (GAUZE/BANDAGES/DRESSINGS) ×1
DERMABOND ADVANCED .7 DNX12 (GAUZE/BANDAGES/DRESSINGS) ×1 IMPLANT
DIFFUSER DRILL AIR PNEUMATIC (MISCELLANEOUS) ×2 IMPLANT
DRAPE HALF SHEET 40X57 (DRAPES) IMPLANT
DRAPE LAPAROTOMY 100X72X124 (DRAPES) ×2 IMPLANT
DRAPE MICROSCOPE LEICA (MISCELLANEOUS) ×2 IMPLANT
DRAPE POUCH INSTRU U-SHP 10X18 (DRAPES) ×2 IMPLANT
DRAPE SURG 17X23 STRL (DRAPES) ×4 IMPLANT
DRSG OPSITE POSTOP 3X4 (GAUZE/BANDAGES/DRESSINGS) ×2 IMPLANT
DURAPREP 26ML APPLICATOR (WOUND CARE) ×2 IMPLANT
ELECT REM PT RETURN 9FT ADLT (ELECTROSURGICAL) ×2
ELECTRODE REM PT RTRN 9FT ADLT (ELECTROSURGICAL) ×1 IMPLANT
GAUZE SPONGE 4X4 12PLY STRL (GAUZE/BANDAGES/DRESSINGS) ×2 IMPLANT
GAUZE SPONGE 4X4 16PLY XRAY LF (GAUZE/BANDAGES/DRESSINGS) IMPLANT
GLOVE ECLIPSE 9.0 STRL (GLOVE) ×2 IMPLANT
GLOVE EXAM NITRILE LRG STRL (GLOVE) IMPLANT
GLOVE EXAM NITRILE XL STR (GLOVE) IMPLANT
GLOVE EXAM NITRILE XS STR PU (GLOVE) IMPLANT
GOWN STRL REUS W/ TWL LRG LVL3 (GOWN DISPOSABLE) IMPLANT
GOWN STRL REUS W/ TWL XL LVL3 (GOWN DISPOSABLE) ×2 IMPLANT
GOWN STRL REUS W/TWL 2XL LVL3 (GOWN DISPOSABLE) IMPLANT
GOWN STRL REUS W/TWL LRG LVL3 (GOWN DISPOSABLE)
GOWN STRL REUS W/TWL XL LVL3 (GOWN DISPOSABLE) ×2
KIT BASIN OR (CUSTOM PROCEDURE TRAY) ×2 IMPLANT
KIT ROOM TURNOVER OR (KITS) ×2 IMPLANT
NEEDLE HYPO 22GX1.5 SAFETY (NEEDLE) ×2 IMPLANT
NEEDLE SPNL 22GX3.5 QUINCKE BK (NEEDLE) ×2 IMPLANT
NS IRRIG 1000ML POUR BTL (IV SOLUTION) ×2 IMPLANT
OIL CARTRIDGE MAESTRO DRILL (MISCELLANEOUS) ×2
PACK LAMINECTOMY NEURO (CUSTOM PROCEDURE TRAY) ×2 IMPLANT
PAD ARMBOARD 7.5X6 YLW CONV (MISCELLANEOUS) ×6 IMPLANT
RUBBERBAND STERILE (MISCELLANEOUS) ×4 IMPLANT
SPONGE SURGIFOAM ABS GEL SZ50 (HEMOSTASIS) ×2 IMPLANT
STRIP CLOSURE SKIN 1/2X4 (GAUZE/BANDAGES/DRESSINGS) ×2 IMPLANT
SUT VIC AB 2-0 CT1 18 (SUTURE) ×2 IMPLANT
SUT VIC AB 3-0 SH 8-18 (SUTURE) ×2 IMPLANT
TOWEL OR 17X24 6PK STRL BLUE (TOWEL DISPOSABLE) ×4 IMPLANT
TOWEL OR 17X26 10 PK STRL BLUE (TOWEL DISPOSABLE) ×2 IMPLANT
WATER STERILE IRR 1000ML POUR (IV SOLUTION) ×2 IMPLANT

## 2016-01-22 NOTE — Brief Op Note (Signed)
01/22/2016  11:34 AM  PATIENT:  [name]  50 y.o. female  PRE-OPERATIVE DIAGNOSIS:  synovial cyst  POST-OPERATIVE DIAGNOSIS:  synovial cyst  PROCEDURE:  Procedure(s) with comments: Laminectomy for facet/synovial cyst - left - Lumbar four - lumbar five (Left) - Laminectomy for facet/synovial cyst - left - Lumbar four - lumbar five  SURGEON:  Surgeon(s) and Role:    * Earnie Larsson, MD - Primary  PHYSICIAN ASSISTANT:   ASSISTANTS: none   ANESTHESIA:   general  EBL:  Total I/O In: -  Out: 50 [Blood:50]  BLOOD ADMINISTERED:none  DRAINS: none   LOCAL MEDICATIONS USED:  MARCAINE     SPECIMEN:  No Specimen  DISPOSITION OF SPECIMEN:  N/A  COUNTS:  YES  TOURNIQUET:  * No tourniquets in log *  DICTATION: .Dragon Dictation  PLAN OF CARE: Admit for overnight observation  PATIENT DISPOSITION:  PACU - hemodynamically stable.   Delay start of Pharmacological VTE agent (>24hrs) due to surgical blood loss or risk of bleeding: yes

## 2016-01-22 NOTE — H&P (Signed)
[name] is an 50 y.o. female.   Chief Complaint: Left leg pain HPI: 50 year old female with severe left lower extremity radicular pain failing conservative management. 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 Patient presents now for laminotomy and resection of synovial cyst in hopes of improving her radicular pain. Her back pain is well-controlled and the thinking is that fusion need not be considered at this time.  Past Medical History:  Diagnosis Date  . Anxiety   . Arthritis   . Asthma   . Bipolar disorder ([place])   . Depression   . GERD (gastroesophageal reflux disease)   . History of lump of left breast   . Hyperlipidemia   . OSA (obstructive sleep apnea) 01/09/2016  . Personality disorder   . PONV (postoperative nausea and vomiting)   . Post traumatic stress disorder (PTSD)   . Substance abuse   . Synovial cyst     Past Surgical History:  Procedure Laterality Date  . KNEE SURGERY Left    x5  . SHOULDER SURGERY Left    x2  . TOE SURGERY Bilateral     Family History  Problem Relation Age of Onset  . Heart disease Father   . Hyperlipidemia Father   . Alcohol abuse Brother   . Alcohol abuse Paternal Uncle   . Colon cancer Neg Hx    Social History:  reports that she has quit smoking. Her smoking use included Cigarettes. She has a 1.00 pack-year smoking history. She has never used smokeless tobacco. She reports that she uses drugs, including Marijuana. She reports that she does not drink alcohol.  Allergies:  Allergies  Allergen Reactions  . Effexor [Venlafaxine] Other (See Comments)    PT DOES SIDE EFFECTS  . Lamictal [Lamotrigine] Rash    Medications Prior to Admission  Medication Sig Dispense Refill  . albuterol (PROVENTIL HFA;VENTOLIN HFA) 108 (90 Base) MCG/ACT inhaler Inhale 2 puffs into the lungs every 4 (four)  hours as needed for wheezing or shortness of breath. 1 Inhaler 1  . buPROPion (WELLBUTRIN XL) 300 MG 24 hr tablet Take 1 tablet (300 mg total) by mouth daily. 90 tablet 1  . etodolac (LODINE) 400 MG tablet Take 400 mg by mouth 2 (two) times daily.     Marland Kitchen gabapentin (NEURONTIN) 100 MG capsule Take 100 mg by mouth 4 (four) times daily.     Marland Kitchen HYDROcodone-acetaminophen (NORCO) 10-325 MG tablet Take 1 tablet by mouth every 6 (six) hours as needed for moderate pain.    . hydrOXYzine (ATARAX/VISTARIL) 50 MG tablet Take 50 mg by mouth 3 (three) times daily.     . paliperidone (INVEGA) 9 MG 24 hr tablet Take 9 mg by mouth every morning.    Marland Kitchen PARoxetine (PAXIL) 40 MG tablet Take 40 mg by mouth every morning.     Marland Kitchen QUEtiapine (SEROQUEL) 25 MG tablet Take 25 mg by mouth daily as needed.     . simvastatin (ZOCOR) 20 MG tablet Take 1 tablet (20 mg total) by mouth daily. 90 tablet 1  . tiZANidine (ZANAFLEX) 2 MG tablet Take 1 tablet by mouth 3 (three) times daily.    Marland Kitchen triamcinolone cream (KENALOG) 0.1 % Apply 1 application topically daily as needed.      No results found for this or any previous visit (from the past 48 hour(s)). No results found.  Blood pressure 120/67, pulse 75, temperature 97.4 F (36.3 C), temperature source Oral, resp. rate 18, weight 67.6 kg (149 lb), SpO2 95 %.  Patient is awake and alert. She is oriented and appropriate. Her cranial nerve function is intact. Her speech is fluent. Judgment and insight are intact. Motor examination reveals weakness of dorsiflexion in her left foot with her extensor hallucis longus it 3/5 in her anterior tibialis at 4/5. She has decreased sensation to light touch in her left L5 dermatome. Straight leg raising is strongly positive the left negative on the right. Reflexes are normal except her Achilles reflexes are absent bilaterally. Examination head ears eyes and throat is unremarkable. Chest and abdomen are benign. Extremities are free from injury or  deformity. 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 Plan left L4-5 laminotomy and resection of synovial cyst. Risks and benefits of been explained. Patient wishes to proceed.  Addalyn Speedy A 01/22/2016, 9:50 AM

## 2016-01-22 NOTE — Discharge Instructions (Signed)

## 2016-01-22 NOTE — Anesthesia Preprocedure Evaluation (Addendum)
Anesthesia Evaluation  Patient identified by MRN, date of birth, ID band Patient awake    Reviewed: Allergy & Precautions, NPO status , Patient's Chart, lab work & pertinent test results  History of Anesthesia Complications (+) PONV and history of anesthetic complications  Airway Mallampati: II  TM Distance: >3 FB Neck ROM: Full    Dental no notable dental hx. (+) Teeth Intact, Dental Advidsory Given   Pulmonary asthma , sleep apnea , former smoker,    Pulmonary exam normal        Cardiovascular negative cardio ROS Normal cardiovascular exam     Neuro/Psych PSYCHIATRIC DISORDERS Anxiety Bipolar Disorder negative neurological ROS     GI/Hepatic Neg liver ROS, GERD  ,  Endo/Other  negative endocrine ROS  Renal/GU negative Renal ROS     Musculoskeletal  (+) Arthritis ,   Abdominal   Peds  Hematology negative hematology ROS (+)   Anesthesia Other Findings   Reproductive/Obstetrics                           Anesthesia Physical Anesthesia Plan  ASA: III  Anesthesia Plan: General   Post-op Pain Management:    Induction: Intravenous  Airway Management Planned: Oral ETT  Additional Equipment:   Intra-op Plan:   Post-operative Plan: Extubation in OR  Informed Consent: I have reviewed the patients History and Physical, chart, labs and discussed the procedure including the risks, benefits and alternatives for the proposed anesthesia with the patient or authorized representative who has indicated his/her understanding and acceptance.   Dental advisory given and Dental Advisory Given  Plan Discussed with: Surgeon and CRNA  Anesthesia Plan Comments:        Anesthesia Quick Evaluation

## 2016-01-22 NOTE — Progress Notes (Signed)
Pt given D/C instructions with Rx, verbal understanding was provided. Pt's incision is clean and dry with no sign of infection. Pt's IV was removed prior to D/C. Pt D/C'd home via wheelchair @ 1655 per MD order. Pt is stable @ D/C and has no other needs at this time. Holli Humbles, RN

## 2016-01-22 NOTE — Op Note (Signed)
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  Asst. Surgeon: None  Anesthesia: General  Indication: 50 year old female with severe left lower extremity pain. Workup demonstrates evidence of a large left-sided L4-5 synovial cyst with severe compression of her left L5 nerve root. No evidence of instability with bending x-rays at L4-5. Minimal back pain. Situation complicated by a grade 2 lytic spondylolisthesis at L5-S1. Patient wishes to avoid fusion surgery. Plan left L4-5 laminotomy and resection of synovial cyst.  Operative note: After induction of anesthesia, patient position prone onto Wilson frame and a properly padded. Lumbar region prepped and draped sterilely. Incision made overlying L4-5. Dissection performed on the left. Retractor placed. X-ray taken. Level confirmed. Laminotomy performed using high-speed drill and Kerrison rongeurs to remove the inferior aspect lamina of L4 medial aspect the L4-5 facet joint and the superior rim of the L5 lamina. Ligament flavum was densely adherent to the underlying thecal sac. This was dissected free using the microscope for microdissection. The synovial cyst was also densely adherent to the thecal sac and left L5 nerve root. This was gradually dissected free in a cephalad to caudad fashion. All elements the synovial cyst were completely resected. There is no evidence of injury to the thecal sac or nerve roots. The decompression along the L5 nerve root continued out toward the defect of her left pars interarticularis. Blunt probe was passed easily out the foramen without evidence of any residual compression. Wound is then irrigated with and bike solution. Gelfoam was placed topically  for hemostasis. Wounds and close in layers with Vicryl sutures. Steri-Strips and sterile dressing were applied. No apparent complications. Patient tolerated the procedure well and she returns to the recovery room postop.

## 2016-01-22 NOTE — Anesthesia Postprocedure Evaluation (Signed)
Anesthesia Post Note  Patient: [name]  Procedure(s) Performed: Procedure(s) (LRB): Laminectomy for facet/synovial cyst - left - Lumbar four - lumbar five (Left)  Patient location during evaluation: PACU Anesthesia Type: General Level of consciousness: awake and alert Pain management: pain level controlled Vital Signs Assessment: post-procedure vital signs reviewed and stable Respiratory status: spontaneous breathing, nonlabored ventilation, respiratory function stable and patient connected to nasal cannula oxygen Cardiovascular status: blood pressure returned to baseline and stable Postop Assessment: no signs of nausea or vomiting Anesthetic complications: no    Last Vitals:  Vitals:   01/22/16 1300 01/22/16 1330  BP: (!) 106/56 93/61  Pulse: 82 78  Resp: 20 20  Temp: 36.4 C 36.4 C    Last Pain:  Vitals:   01/22/16 1300  TempSrc:   PainSc: 0-No pain                 Reginal Lutes

## 2016-01-22 NOTE — Progress Notes (Signed)
Pt sleeps, snores when left alone, easily arousable, denies pain. She says she was recently dx with OSA-is going to go to classes, not on CPAP at present time. Dr Lamarr Lulas fully updated-came to see pt . OK to tx to floor.

## 2016-01-22 NOTE — Transfer of Care (Signed)
Immediate Anesthesia Transfer of Care Note  Patient: [name]  Procedure(s) Performed: Procedure(s) with comments: Laminectomy for facet/synovial cyst - left - Lumbar four - lumbar five (Left) - Laminectomy for facet/synovial cyst - left - Lumbar four - lumbar five  Patient Location: PACU  Anesthesia Type:General  Level of Consciousness: awake, alert  and oriented  Airway & Oxygen Therapy: Patient Spontanous Breathing and Patient connected to nasal cannula oxygen  Post-op Assessment: Report given to RN, Post -op Vital signs reviewed and stable and Patient moving all extremities X 4  Post vital signs: Reviewed and stable  Last Vitals:  Vitals:   01/22/16 0849  BP: 120/67  Pulse: 75  Resp: 18  Temp: 36.3 C    Last Pain:  Vitals:   01/22/16 0932  TempSrc:   PainSc: 6       Patients Stated Pain Goal: 4 ([id number])  Complications: No apparent anesthesia complications

## 2016-01-22 NOTE — Discharge Summary (Signed)
Physician Discharge Summary  Patient ID: [name] MRN: [id number] DOB/AGE: 1966/02/03 50 y.o.  Admit date: 01/22/2016 Discharge date: 01/22/2016  Admission Diagnoses:  Discharge Diagnoses:  Active Problems:   Synovial cyst of lumbar facet joint   Discharged Condition: good  Hospital Course: Patient admitted to the hospital where she underwent an uncomplicated laminotomy and resection of synovial cyst. Postoperative she is doing well. Ready for discharge home.  Consults:   Significant Diagnostic Studies:   Treatments:   Discharge Exam: Blood pressure 120/67, pulse 75, temperature 97.4 F (36.3 C), temperature source Oral, resp. rate 18, weight 67.6 kg (149 lb), SpO2 95 %. Awake and alert. Oriented and appropriate. Motor and sensory function intact. Wound clean and dry. Chest and abdomen benign. Disposition:      Medication List    TAKE these medications   albuterol 108 (90 Base) MCG/ACT inhaler Commonly known as:  PROVENTIL HFA;VENTOLIN HFA Inhale 2 puffs into the lungs every 4 (four) hours as needed for wheezing or shortness of breath.   buPROPion 300 MG 24 hr tablet Commonly known as:  WELLBUTRIN XL Take 1 tablet (300 mg total) by mouth daily.   etodolac 400 MG tablet Commonly known as:  LODINE Take 400 mg by mouth 2 (two) times daily.   gabapentin 100 MG capsule Commonly known as:  NEURONTIN Take 100 mg by mouth 4 (four) times daily.   HYDROcodone-acetaminophen 10-325 MG tablet Commonly known as:  NORCO Take 1-2 tablets by mouth every 4 (four) hours as needed for moderate pain. What changed:  how much to take  when to take this   hydrOXYzine 50 MG tablet Commonly known as:  ATARAX/VISTARIL Take 50 mg by mouth 3 (three) times daily.   paliperidone 9 MG 24 hr tablet Commonly known as:  INVEGA Take 9 mg by mouth every morning.   PARoxetine 40 MG tablet Commonly known as:  PAXIL Take 40 mg by mouth every morning.   QUEtiapine 25 MG  tablet Commonly known as:  SEROQUEL Take 25 mg by mouth daily as needed.   simvastatin 20 MG tablet Commonly known as:  ZOCOR Take 1 tablet (20 mg total) by mouth daily.   tiZANidine 2 MG tablet Commonly known as:  ZANAFLEX Take 1 tablet (2 mg total) by mouth 3 (three) times daily.   triamcinolone cream 0.1 % Commonly known as:  KENALOG Apply 1 application topically daily as needed.      Follow-up Information    Saniah Schroeter A, MD Follow up.   Specialty:  Neurosurgery Contact information: [address] [place] [address] [phone]           Signed: Charlie Pitter 01/22/2016, 11:52 AM

## 2016-01-22 NOTE — Anesthesia Procedure Notes (Signed)
Procedure Name: Intubation Date/Time: 01/22/2016 10:32 AM Performed by: Neldon Newport Pre-anesthesia Checklist: Timeout performed, Patient being monitored, Suction available, Emergency Drugs available and Patient identified Patient Re-evaluated:Patient Re-evaluated prior to inductionOxygen Delivery Method: Circle system utilized Preoxygenation: Pre-oxygenation with 100% oxygen Intubation Type: IV induction Ventilation: Mask ventilation without difficulty Laryngoscope Size: Mac and 3 Grade View: Grade II Tube type: Oral Tube size: 7.0 mm Number of attempts: 1 Placement Confirmation: breath sounds checked- equal and bilateral,  positive ETCO2 and ETT inserted through vocal cords under direct vision Secured at: 21 cm Tube secured with: Tape Dental Injury: Teeth and Oropharynx as per pre-operative assessment

## 2016-01-23 ENCOUNTER — Encounter (HOSPITAL_COMMUNITY): Payer: Self-pay | Admitting: Neurosurgery

## 2016-02-04 ENCOUNTER — Other Ambulatory Visit: Payer: Self-pay | Admitting: Internal Medicine

## 2016-04-18 ENCOUNTER — Telehealth: Payer: Self-pay

## 2016-04-18 DIAGNOSIS — F3181 Bipolar II disorder: Secondary | ICD-10-CM | POA: Diagnosis not present

## 2016-04-18 MED ORDER — SCOPOLAMINE 1 MG/3DAYS TD PT72
1.0000 | MEDICATED_PATCH | TRANSDERMAL | 0 refills | Status: DC
Start: 2016-04-18 — End: 2017-03-24

## 2016-04-18 NOTE — Telephone Encounter (Signed)
Sent to [facility]  Schedule PE summer or fall please

## 2016-04-18 NOTE — Telephone Encounter (Signed)
Pt notified Rx sent and she will call and schedule her CPE with Webb Silversmith, NP

## 2016-04-18 NOTE — Telephone Encounter (Addendum)
Pt left v/m requesting sea sick patch to [facility] ; pt is going on a cruise next week. Pt will be gone 7 days on cruise Last annual 03/15/15. No future appt scheduled.pt request cb. Avie Echevaria NP out of office.

## 2016-04-21 DIAGNOSIS — M545 Low back pain: Secondary | ICD-10-CM | POA: Diagnosis not present

## 2016-04-21 DIAGNOSIS — M7138 Other bursal cyst, other site: Secondary | ICD-10-CM | POA: Diagnosis not present

## 2016-05-05 DIAGNOSIS — M7138 Other bursal cyst, other site: Secondary | ICD-10-CM | POA: Diagnosis not present

## 2016-05-05 DIAGNOSIS — M545 Low back pain: Secondary | ICD-10-CM | POA: Diagnosis not present

## 2016-05-07 DIAGNOSIS — M545 Low back pain: Secondary | ICD-10-CM | POA: Diagnosis not present

## 2016-05-07 DIAGNOSIS — M7138 Other bursal cyst, other site: Secondary | ICD-10-CM | POA: Diagnosis not present

## 2016-05-13 DIAGNOSIS — M7138 Other bursal cyst, other site: Secondary | ICD-10-CM | POA: Diagnosis not present

## 2016-05-13 DIAGNOSIS — Z6825 Body mass index (BMI) 25.0-25.9, adult: Secondary | ICD-10-CM | POA: Diagnosis not present

## 2016-05-23 ENCOUNTER — Other Ambulatory Visit: Payer: Self-pay | Admitting: Obstetrics and Gynecology

## 2016-05-23 DIAGNOSIS — N6320 Unspecified lump in the left breast, unspecified quadrant: Secondary | ICD-10-CM

## 2016-05-28 ENCOUNTER — Other Ambulatory Visit: Payer: Medicare Other

## 2016-07-21 ENCOUNTER — Ambulatory Visit
Admission: RE | Admit: 2016-07-21 | Discharge: 2016-07-21 | Disposition: A | Discharge status: Home / Self Care | Payer: Medicare Other | Source: Ambulatory Visit | Attending: Obstetrics and Gynecology | Admitting: Obstetrics and Gynecology

## 2016-07-21 ENCOUNTER — Other Ambulatory Visit: Payer: Self-pay | Admitting: Obstetrics and Gynecology

## 2016-07-21 DIAGNOSIS — N6320 Unspecified lump in the left breast, unspecified quadrant: Secondary | ICD-10-CM

## 2016-07-21 DIAGNOSIS — R928 Other abnormal and inconclusive findings on diagnostic imaging of breast: Secondary | ICD-10-CM

## 2016-07-21 DIAGNOSIS — N6323 Unspecified lump in the left breast, lower outer quadrant: Secondary | ICD-10-CM | POA: Diagnosis not present

## 2016-08-20 DIAGNOSIS — R030 Elevated blood-pressure reading, without diagnosis of hypertension: Secondary | ICD-10-CM | POA: Diagnosis not present

## 2016-08-20 DIAGNOSIS — M7138 Other bursal cyst, other site: Secondary | ICD-10-CM | POA: Diagnosis not present

## 2016-09-08 DIAGNOSIS — M7138 Other bursal cyst, other site: Secondary | ICD-10-CM | POA: Diagnosis not present

## 2016-09-09 DIAGNOSIS — M5126 Other intervertebral disc displacement, lumbar region: Secondary | ICD-10-CM | POA: Diagnosis not present

## 2016-09-09 DIAGNOSIS — M7138 Other bursal cyst, other site: Secondary | ICD-10-CM | POA: Diagnosis not present

## 2016-09-10 ENCOUNTER — Other Ambulatory Visit: Payer: Self-pay | Admitting: Neurosurgery

## 2016-09-10 DIAGNOSIS — M4317 Spondylolisthesis, lumbosacral region: Secondary | ICD-10-CM | POA: Diagnosis not present

## 2016-09-29 ENCOUNTER — Other Ambulatory Visit: Payer: Self-pay | Admitting: Internal Medicine

## 2016-09-30 ENCOUNTER — Inpatient Hospital Stay (HOSPITAL_COMMUNITY): Admission: RE | Admit: 2016-09-30 | Payer: Medicare Other | Source: Ambulatory Visit

## 2016-10-01 NOTE — Pre-Procedure Instructions (Signed)
[name]  10/01/2016      [facility] - [place], [place] - [address] [place] [address] Phone: [phone] Fax: [phone]    Your procedure is scheduled on October 07, 2016.  Report to [facility] Admitting at 600 AM.  Call this number if you have problems the morning of surgery:  [phone]   Remember:  Do not eat food or drink liquids after midnight.  Take these medicines the morning of surgery with A SIP OF WATER albuterol inhaler, bupropion (wellbutrin), hydrocodone-acetaminophen (norco)-if needed for pain, hydroxyzine (atarax)-if needed for anxiety, paliperidone (Invega), paroxetine (paxil), zanaflex ( tizanidine)-if needed for muscle spasms  Bring inhaler with you day of surgery   7 days prior to surgery STOP taking any Aspirin, Aleve, Naproxen, Ibuprofen, Motrin, Advil, Goody's, BC's, all herbal medications, fish oil, and all vitamins  Do not wear jewelry, make-up or nail polish.  Do not wear lotions, powders, or perfumes, or deoderant.  Do not shave 48 hours prior to surgery.    Do not bring valuables to the hospital.  [facility] is not responsible for any belongings or valuables.  Contacts, dentures or bridgework may not be worn into surgery.  Leave your suitcase in the car.  After surgery it may be brought to your room.  For patients admitted to the hospital, discharge time will be determined by your treatment team.  Patients discharged the day of surgery will not be allowed to drive home.    Special instructions:   [facility]- Preparing For Surgery  Before surgery, you can play an important role. Because skin is not sterile, your skin needs to be as free of germs as possible. You can reduce the number of germs on your skin by washing with CHG (chlorahexidine gluconate) Soap before surgery.  CHG is an antiseptic cleaner which kills germs and bonds with the skin to continue killing germs even  after washing.  Please do not use if you have an allergy to CHG or antibacterial soaps. If your skin becomes reddened/irritated stop using the CHG.  Do not shave (including legs and underarms) for at least 48 hours prior to first CHG shower. It is OK to shave your face.  Please follow these instructions carefully.   1. Shower the NIGHT BEFORE SURGERY and the MORNING OF SURGERY with CHG.   2. If you chose to wash your hair, wash your hair first as usual with your normal shampoo.  3. After you shampoo, rinse your hair and body thoroughly to remove the shampoo.  4. Use CHG as you would any other liquid soap. You can apply CHG directly to the skin and wash gently with a scrungie or a clean washcloth.   5. Apply the CHG Soap to your body ONLY FROM THE NECK DOWN.  Do not use on open wounds or open sores. Avoid contact with your eyes, ears, mouth and genitals (private parts). Wash genitals (private parts) with your normal soap.  6. Wash thoroughly, paying special attention to the area where your surgery will be performed.  7. Thoroughly rinse your body with warm water from the neck down.  8. DO NOT shower/wash with your normal soap after using and rinsing off the CHG Soap.  9. Pat yourself dry with a CLEAN TOWEL.   10. Wear CLEAN PAJAMAS   11. Place CLEAN SHEETS on your bed the night of your first shower and DO NOT  SLEEP WITH PETS.    Day of Surgery: Do not apply any deodorants/lotions. Please wear clean clothes to the hospital/surgery center.     Please read over the following fact sheets that you were given. Pain Booklet, Coughing and Deep Breathing, MRSA Information and Surgical Site Infection Prevention

## 2016-10-02 ENCOUNTER — Encounter (HOSPITAL_COMMUNITY)
Admission: RE | Admit: 2016-10-02 | Discharge: 2016-10-02 | Disposition: A | Discharge status: Home / Self Care | Payer: Medicare Other | Source: Ambulatory Visit | Attending: Neurosurgery | Admitting: Neurosurgery

## 2016-10-02 ENCOUNTER — Encounter (HOSPITAL_COMMUNITY): Payer: Self-pay

## 2016-10-02 DIAGNOSIS — Z01812 Encounter for preprocedural laboratory examination: Secondary | ICD-10-CM | POA: Diagnosis not present

## 2016-10-02 DIAGNOSIS — M4316 Spondylolisthesis, lumbar region: Secondary | ICD-10-CM | POA: Insufficient documentation

## 2016-10-02 HISTORY — DX: Personal history of other medical treatment: Z92.89

## 2016-10-02 LAB — CBC WITH DIFFERENTIAL/PLATELET
BASOS ABS: 0 10*3/uL (ref 0.0–0.1)
Basophils Relative: 0 %
EOS ABS: 0.2 10*3/uL (ref 0.0–0.7)
EOS PCT: 3 %
HCT: 43.8 % (ref 36.0–46.0)
Hemoglobin: 15.4 g/dL — ABNORMAL HIGH (ref 12.0–15.0)
LYMPHS PCT: 30 %
Lymphs Abs: 2.1 10*3/uL (ref 0.7–4.0)
MCH: 31.4 pg (ref 26.0–34.0)
MCHC: 35.2 g/dL (ref 30.0–36.0)
MCV: 89.2 fL (ref 78.0–100.0)
MONO ABS: 0.4 10*3/uL (ref 0.1–1.0)
Monocytes Relative: 5 %
Neutro Abs: 4.3 10*3/uL (ref 1.7–7.7)
Neutrophils Relative %: 62 %
PLATELETS: 372 10*3/uL (ref 150–400)
RBC: 4.91 MIL/uL (ref 3.87–5.11)
RDW: 13.6 % (ref 11.5–15.5)
WBC: 7 10*3/uL (ref 4.0–10.5)

## 2016-10-02 LAB — COMPREHENSIVE METABOLIC PANEL
ALT: 17 U/L (ref 14–54)
AST: 19 U/L (ref 15–41)
Albumin: 4.3 g/dL (ref 3.5–5.0)
Alkaline Phosphatase: 66 U/L (ref 38–126)
Anion gap: 9 (ref 5–15)
BILIRUBIN TOTAL: 0.6 mg/dL (ref 0.3–1.2)
BUN: 6 mg/dL (ref 6–20)
CO2: 26 mmol/L (ref 22–32)
CREATININE: 0.8 mg/dL (ref 0.44–1.00)
Calcium: 9.9 mg/dL (ref 8.9–10.3)
Chloride: 100 mmol/L — ABNORMAL LOW (ref 101–111)
Glucose, Bld: 106 mg/dL — ABNORMAL HIGH (ref 65–99)
POTASSIUM: 4.5 mmol/L (ref 3.5–5.1)
Sodium: 135 mmol/L (ref 135–145)
TOTAL PROTEIN: 7.3 g/dL (ref 6.5–8.1)

## 2016-10-02 LAB — TYPE AND SCREEN
ABO/RH(D): O POS
Antibody Screen: NEGATIVE

## 2016-10-02 LAB — SURGICAL PCR SCREEN
MRSA, PCR: NEGATIVE
STAPHYLOCOCCUS AUREUS: NEGATIVE

## 2016-10-02 LAB — ABO/RH: ABO/RH(D): O POS

## 2016-10-02 NOTE — Progress Notes (Signed)
Pt. Denies all chest complaints., followed by [facility] for med. Management. Pt. Reports visit to ED 2003- had EKG that she reports was normal & she was told that she was having an anxiety attack. PCP- follows pt. With [facility].

## 2016-10-07 ENCOUNTER — Encounter (HOSPITAL_COMMUNITY): Payer: Self-pay | Admitting: *Deleted

## 2016-10-07 ENCOUNTER — Inpatient Hospital Stay (HOSPITAL_COMMUNITY)
Admission: RE | Disposition: A | Discharge status: Home / Self Care | Payer: Self-pay | Source: Ambulatory Visit | Attending: Neurosurgery

## 2016-10-07 ENCOUNTER — Inpatient Hospital Stay (HOSPITAL_COMMUNITY): Payer: Medicare Other | Admitting: Vascular Surgery

## 2016-10-07 ENCOUNTER — Inpatient Hospital Stay (HOSPITAL_COMMUNITY): Payer: Medicare Other | Admitting: Anesthesiology

## 2016-10-07 ENCOUNTER — Inpatient Hospital Stay (HOSPITAL_COMMUNITY): Payer: Medicare Other

## 2016-10-07 ENCOUNTER — Inpatient Hospital Stay (HOSPITAL_COMMUNITY)
Admission: RE | Admit: 2016-10-07 | Discharge: 2016-10-09 | DRG: 455 | Disposition: A | Discharge status: Home / Self Care | Payer: Medicare Other | Source: Ambulatory Visit | Attending: Neurosurgery | Admitting: Neurosurgery

## 2016-10-07 DIAGNOSIS — M4807 Spinal stenosis, lumbosacral region: Secondary | ICD-10-CM | POA: Diagnosis present

## 2016-10-07 DIAGNOSIS — Z87891 Personal history of nicotine dependence: Secondary | ICD-10-CM | POA: Diagnosis not present

## 2016-10-07 DIAGNOSIS — Z419 Encounter for procedure for purposes other than remedying health state, unspecified: Secondary | ICD-10-CM

## 2016-10-07 DIAGNOSIS — Z79899 Other long term (current) drug therapy: Secondary | ICD-10-CM

## 2016-10-07 DIAGNOSIS — Z7982 Long term (current) use of aspirin: Secondary | ICD-10-CM

## 2016-10-07 DIAGNOSIS — M4316 Spondylolisthesis, lumbar region: Secondary | ICD-10-CM | POA: Diagnosis present

## 2016-10-07 DIAGNOSIS — M4317 Spondylolisthesis, lumbosacral region: Secondary | ICD-10-CM | POA: Diagnosis not present

## 2016-10-07 DIAGNOSIS — M48061 Spinal stenosis, lumbar region without neurogenic claudication: Secondary | ICD-10-CM | POA: Diagnosis not present

## 2016-10-07 DIAGNOSIS — Z8249 Family history of ischemic heart disease and other diseases of the circulatory system: Secondary | ICD-10-CM

## 2016-10-07 DIAGNOSIS — Z8349 Family history of other endocrine, nutritional and metabolic diseases: Secondary | ICD-10-CM

## 2016-10-07 DIAGNOSIS — M4326 Fusion of spine, lumbar region: Secondary | ICD-10-CM | POA: Diagnosis not present

## 2016-10-07 DIAGNOSIS — E785 Hyperlipidemia, unspecified: Secondary | ICD-10-CM | POA: Diagnosis not present

## 2016-10-07 DIAGNOSIS — G4733 Obstructive sleep apnea (adult) (pediatric): Secondary | ICD-10-CM | POA: Diagnosis not present

## 2016-10-07 DIAGNOSIS — M5416 Radiculopathy, lumbar region: Secondary | ICD-10-CM | POA: Diagnosis not present

## 2016-10-07 DIAGNOSIS — J45909 Unspecified asthma, uncomplicated: Secondary | ICD-10-CM | POA: Diagnosis not present

## 2016-10-07 SURGERY — POSTERIOR LUMBAR FUSION 2 LEVEL
Anesthesia: General | Site: Back

## 2016-10-07 MED ORDER — SODIUM CHLORIDE 0.9% FLUSH: 3.0000 mL | Freq: Two times a day (BID) | INTRAVENOUS | Status: DC

## 2016-10-07 MED ORDER — VANCOMYCIN HCL 1000 MG IV SOLR
INTRAVENOUS | Status: DC | PRN
  Administered 2016-10-07: 1000 mg via TOPICAL

## 2016-10-07 MED ORDER — THROMBIN 20000 UNITS EX SOLR
CUTANEOUS | Status: DC | PRN
  Administered 2016-10-07: 20 mL via TOPICAL

## 2016-10-07 MED ORDER — QUETIAPINE FUMARATE 50 MG PO TABS
50.0000 mg | ORAL_TABLET | Freq: Two times a day (BID) | ORAL | Status: DC | PRN
  Filled 2016-10-07: qty 1

## 2016-10-07 MED ORDER — EPHEDRINE SULFATE 50 MG/ML IJ SOLN
INTRAMUSCULAR | Status: DC | PRN
  Administered 2016-10-07: 5 mg via INTRAVENOUS
  Administered 2016-10-07: 10 mg via INTRAVENOUS
  Administered 2016-10-07 (×2): 5 mg via INTRAVENOUS

## 2016-10-07 MED ORDER — PAROXETINE HCL 20 MG PO TABS
40.0000 mg | ORAL_TABLET | ORAL | Status: DC
  Administered 2016-10-08 – 2016-10-09 (×2): 40 mg via ORAL
  Filled 2016-10-07 (×2): qty 2

## 2016-10-07 MED ORDER — PHENYLEPHRINE HCL 10 MG/ML IJ SOLN
INTRAMUSCULAR | Status: DC | PRN
  Administered 2016-10-07: 40 ug via INTRAVENOUS

## 2016-10-07 MED ORDER — LACTATED RINGERS IV SOLN
INTRAVENOUS | Status: DC
  Administered 2016-10-07 (×3): via INTRAVENOUS

## 2016-10-07 MED ORDER — TIZANIDINE HCL 4 MG PO TABS
4.0000 mg | ORAL_TABLET | Freq: Three times a day (TID) | ORAL | Status: DC | PRN
Start: 2016-10-07 — End: 2016-10-09
  Administered 2016-10-07 – 2016-10-08 (×2): 4 mg via ORAL
  Filled 2016-10-07 (×3): qty 1

## 2016-10-07 MED ORDER — DIAZEPAM 5 MG PO TABS
5.0000 mg | ORAL_TABLET | Freq: Four times a day (QID) | ORAL | Status: DC | PRN
  Administered 2016-10-07: 5 mg via ORAL
  Administered 2016-10-07 – 2016-10-08 (×2): 10 mg via ORAL
  Filled 2016-10-07 (×3): qty 2
  Filled 2016-10-07: qty 1

## 2016-10-07 MED ORDER — PHENOL 1.4 % MT LIQD: 1.0000 | OROMUCOSAL | Status: DC | PRN

## 2016-10-07 MED ORDER — ROCURONIUM BROMIDE 10 MG/ML (PF) SYRINGE
PREFILLED_SYRINGE | INTRAVENOUS | Status: AC
  Filled 2016-10-07: qty 5

## 2016-10-07 MED ORDER — SUGAMMADEX SODIUM 200 MG/2ML IV SOLN
INTRAVENOUS | Status: DC | PRN
  Administered 2016-10-07: 105 mg via INTRAVENOUS

## 2016-10-07 MED ORDER — MIDAZOLAM HCL 2 MG/2ML IJ SOLN
INTRAMUSCULAR | Status: AC
  Filled 2016-10-07: qty 2

## 2016-10-07 MED ORDER — ROCURONIUM BROMIDE 100 MG/10ML IV SOLN
INTRAVENOUS | Status: DC | PRN
  Administered 2016-10-07: 40 mg via INTRAVENOUS
  Administered 2016-10-07 (×2): 20 mg via INTRAVENOUS

## 2016-10-07 MED ORDER — MENTHOL 3 MG MT LOZG: 1.0000 | LOZENGE | OROMUCOSAL | Status: DC | PRN

## 2016-10-07 MED ORDER — HYDROMORPHONE HCL 1 MG/ML IJ SOLN
0.5000 mg | INTRAMUSCULAR | Status: DC | PRN
  Administered 2016-10-07: 1 mg via INTRAVENOUS
  Administered 2016-10-07 – 2016-10-08 (×2): 0.5 mg via INTRAVENOUS
  Filled 2016-10-07 (×2): qty 0.5
  Filled 2016-10-07: qty 1

## 2016-10-07 MED ORDER — CEFAZOLIN SODIUM-DEXTROSE 2-3 GM-% IV SOLR
INTRAVENOUS | Status: DC | PRN
  Administered 2016-10-07: 2 g via INTRAVENOUS

## 2016-10-07 MED ORDER — PROPOFOL 10 MG/ML IV BOLUS
INTRAVENOUS | Status: DC | PRN
  Administered 2016-10-07: 40 mg via INTRAVENOUS
  Administered 2016-10-07: 110 mg via INTRAVENOUS

## 2016-10-07 MED ORDER — BUPIVACAINE HCL (PF) 0.25 % IJ SOLN
INTRAMUSCULAR | Status: DC | PRN
  Administered 2016-10-07: 20 mL

## 2016-10-07 MED ORDER — SCOPOLAMINE 1 MG/3DAYS TD PT72
1.0000 | MEDICATED_PATCH | TRANSDERMAL | Status: DC
  Administered 2016-10-07: 1.5 mg via TRANSDERMAL

## 2016-10-07 MED ORDER — HYDROCODONE-ACETAMINOPHEN 10-325 MG PO TABS: 1.0000 | ORAL_TABLET | ORAL | Status: DC | PRN

## 2016-10-07 MED ORDER — ARTIFICIAL TEARS OPHTHALMIC OINT
TOPICAL_OINTMENT | OPHTHALMIC | Status: AC
  Filled 2016-10-07: qty 3.5

## 2016-10-07 MED ORDER — HYDROMORPHONE HCL 1 MG/ML IJ SOLN: 0.2500 mg | INTRAMUSCULAR | Status: DC | PRN

## 2016-10-07 MED ORDER — KETAMINE HCL-SODIUM CHLORIDE 100-0.9 MG/10ML-% IV SOSY
PREFILLED_SYRINGE | INTRAVENOUS | Status: AC
  Filled 2016-10-07: qty 10

## 2016-10-07 MED ORDER — OXYCODONE HCL 5 MG PO TABS
5.0000 mg | ORAL_TABLET | Freq: Once | ORAL | Status: AC | PRN
  Administered 2016-10-07: 5 mg via ORAL

## 2016-10-07 MED ORDER — PROPOFOL 10 MG/ML IV BOLUS
INTRAVENOUS | Status: AC
  Filled 2016-10-07: qty 40

## 2016-10-07 MED ORDER — VANCOMYCIN HCL 1000 MG IV SOLR
INTRAVENOUS | Status: AC
  Filled 2016-10-07: qty 1000

## 2016-10-07 MED ORDER — OXYCODONE HCL 5 MG/5ML PO SOLN
5.0000 mg | Freq: Once | ORAL | Status: AC | PRN
Start: 2016-10-07 — End: 2016-10-07

## 2016-10-07 MED ORDER — BUPROPION HCL ER (XL) 300 MG PO TB24
300.0000 mg | ORAL_TABLET | Freq: Every day | ORAL | Status: DC
  Administered 2016-10-08 – 2016-10-09 (×2): 300 mg via ORAL
  Filled 2016-10-07 (×2): qty 1

## 2016-10-07 MED ORDER — ONDANSETRON HCL 4 MG/2ML IJ SOLN
INTRAMUSCULAR | Status: DC | PRN
  Administered 2016-10-07: 4 mg via INTRAVENOUS

## 2016-10-07 MED ORDER — SIMVASTATIN 20 MG PO TABS
20.0000 mg | ORAL_TABLET | Freq: Every day | ORAL | Status: DC
  Administered 2016-10-07 – 2016-10-09 (×3): 20 mg via ORAL
  Filled 2016-10-07 (×3): qty 1

## 2016-10-07 MED ORDER — DEXAMETHASONE SODIUM PHOSPHATE 10 MG/ML IJ SOLN
INTRAMUSCULAR | Status: AC
  Filled 2016-10-07: qty 1

## 2016-10-07 MED ORDER — ONDANSETRON HCL 4 MG PO TABS: 4.0000 mg | ORAL_TABLET | Freq: Four times a day (QID) | ORAL | Status: DC | PRN

## 2016-10-07 MED ORDER — GELATIN ABSORBABLE MT POWD
OROMUCOSAL | Status: DC | PRN
  Administered 2016-10-07: 5 mL via TOPICAL

## 2016-10-07 MED ORDER — EPHEDRINE 5 MG/ML INJ
INTRAVENOUS | Status: AC
  Filled 2016-10-07: qty 10

## 2016-10-07 MED ORDER — THROMBIN 20000 UNITS EX SOLR
CUTANEOUS | Status: AC
  Filled 2016-10-07: qty 20000

## 2016-10-07 MED ORDER — ONDANSETRON HCL 4 MG/2ML IJ SOLN: 4.0000 mg | Freq: Four times a day (QID) | INTRAMUSCULAR | Status: DC | PRN

## 2016-10-07 MED ORDER — CEFAZOLIN SODIUM-DEXTROSE 1-4 GM/50ML-% IV SOLN
1.0000 g | Freq: Three times a day (TID) | INTRAVENOUS | Status: AC
  Administered 2016-10-07 (×2): 1 g via INTRAVENOUS
  Filled 2016-10-07 (×2): qty 50

## 2016-10-07 MED ORDER — HYDROXYZINE HCL 25 MG PO TABS
50.0000 mg | ORAL_TABLET | Freq: Three times a day (TID) | ORAL | Status: DC | PRN
  Administered 2016-10-07 – 2016-10-08 (×2): 50 mg via ORAL
  Filled 2016-10-07 (×2): qty 2

## 2016-10-07 MED ORDER — SCOPOLAMINE 1 MG/3DAYS TD PT72
MEDICATED_PATCH | TRANSDERMAL | Status: AC
  Filled 2016-10-07: qty 1

## 2016-10-07 MED ORDER — FENTANYL CITRATE (PF) 250 MCG/5ML IJ SOLN
INTRAMUSCULAR | Status: AC
  Filled 2016-10-07: qty 5

## 2016-10-07 MED ORDER — THROMBIN 5000 UNITS EX SOLR
CUTANEOUS | Status: AC
  Filled 2016-10-07: qty 5000

## 2016-10-07 MED ORDER — DEXAMETHASONE SODIUM PHOSPHATE 10 MG/ML IJ SOLN
INTRAMUSCULAR | Status: DC | PRN
  Administered 2016-10-07: 10 mg via INTRAVENOUS

## 2016-10-07 MED ORDER — MIDAZOLAM HCL 5 MG/5ML IJ SOLN
INTRAMUSCULAR | Status: DC | PRN
  Administered 2016-10-07 (×2): 1 mg via INTRAVENOUS

## 2016-10-07 MED ORDER — ONDANSETRON HCL 4 MG/2ML IJ SOLN
INTRAMUSCULAR | Status: AC
  Filled 2016-10-07: qty 2

## 2016-10-07 MED ORDER — PALIPERIDONE ER 6 MG PO TB24
9.0000 mg | ORAL_TABLET | ORAL | Status: DC
  Administered 2016-10-08 – 2016-10-09 (×2): 9 mg via ORAL
  Filled 2016-10-07 (×2): qty 1

## 2016-10-07 MED ORDER — 0.9 % SODIUM CHLORIDE (POUR BTL) OPTIME
TOPICAL | Status: DC | PRN
  Administered 2016-10-07: 1000 mL

## 2016-10-07 MED ORDER — KETAMINE HCL 10 MG/ML IJ SOLN
INTRAMUSCULAR | Status: DC | PRN
  Administered 2016-10-07 (×3): 10 mg via INTRAVENOUS
  Administered 2016-10-07: 20 mg via INTRAVENOUS
  Administered 2016-10-07: 30 mg via INTRAVENOUS
  Administered 2016-10-07: 20 mg via INTRAVENOUS

## 2016-10-07 MED ORDER — CEFAZOLIN SODIUM-DEXTROSE 2-4 GM/100ML-% IV SOLN
INTRAVENOUS | Status: AC
  Filled 2016-10-07: qty 100

## 2016-10-07 MED ORDER — OXYCODONE HCL 5 MG PO TABS
ORAL_TABLET | ORAL | Status: AC
  Administered 2016-10-07: 5 mg via ORAL
  Filled 2016-10-07: qty 1

## 2016-10-07 MED ORDER — SODIUM CHLORIDE 0.9 % IR SOLN
Status: DC | PRN
  Administered 2016-10-07: 500 mL

## 2016-10-07 MED ORDER — SODIUM CHLORIDE 0.9% FLUSH: 3.0000 mL | INTRAVENOUS | Status: DC | PRN

## 2016-10-07 MED ORDER — FENTANYL CITRATE (PF) 100 MCG/2ML IJ SOLN
INTRAMUSCULAR | Status: DC | PRN
Start: 2016-10-07 — End: 2016-10-07
  Administered 2016-10-07 (×2): 50 ug via INTRAVENOUS
  Administered 2016-10-07: 150 ug via INTRAVENOUS

## 2016-10-07 MED ORDER — HYDROCODONE-ACETAMINOPHEN 10-325 MG PO TABS
1.0000 | ORAL_TABLET | ORAL | Status: DC | PRN
  Administered 2016-10-07 – 2016-10-09 (×7): 2 via ORAL
  Filled 2016-10-07 (×5): qty 2
  Filled 2016-10-07: qty 1
  Filled 2016-10-07 (×2): qty 2

## 2016-10-07 MED ORDER — ALBUTEROL SULFATE (2.5 MG/3ML) 0.083% IN NEBU: 2.5000 mg | INHALATION_SOLUTION | RESPIRATORY_TRACT | Status: DC | PRN

## 2016-10-07 MED FILL — Heparin Sodium (Porcine) Inj 1000 Unit/ML: INTRAMUSCULAR | Qty: 30 | Status: AC

## 2016-10-07 MED FILL — Sodium Chloride IV Soln 0.9%: INTRAVENOUS | Qty: 1000 | Status: AC

## 2016-10-07 SURGICAL SUPPLY — 76 items
BAG DECANTER FOR FLEXI CONT (MISCELLANEOUS) ×2 IMPLANT
BENZOIN TINCTURE PRP APPL 2/3 (GAUZE/BANDAGES/DRESSINGS) ×2 IMPLANT
BLADE CLIPPER SURG (BLADE) IMPLANT
BUR CUTTER 7.0 ROUND (BURR) ×2 IMPLANT
BUR MATCHSTICK NEURO 3.0 LAGG (BURR) ×2 IMPLANT
CANISTER SUCT 3000ML PPV (MISCELLANEOUS) ×2 IMPLANT
CAP LCK SPNE (Orthopedic Implant) ×6 IMPLANT
CAP LOCK SPINE RADIUS (Orthopedic Implant) ×6 IMPLANT
CAP LOCKING (Orthopedic Implant) ×6 IMPLANT
CARTRIDGE OIL MAESTRO DRILL (MISCELLANEOUS) ×1 IMPLANT
CONT SPEC 4OZ CLIKSEAL STRL BL (MISCELLANEOUS) ×2 IMPLANT
COVER BACK TABLE 60X90IN (DRAPES) ×2 IMPLANT
DECANTER SPIKE VIAL GLASS SM (MISCELLANEOUS) IMPLANT
DERMABOND ADVANCED (GAUZE/BANDAGES/DRESSINGS) ×1
DERMABOND ADVANCED .7 DNX12 (GAUZE/BANDAGES/DRESSINGS) ×1 IMPLANT
DEVICE INTERBODY ELEVATE 23X8 (Cage) ×2 IMPLANT
DEVICE INTERBODY ELEVATE 9X23 (Cage) ×4 IMPLANT
DIFFUSER DRILL AIR PNEUMATIC (MISCELLANEOUS) ×2 IMPLANT
DRAPE C-ARM 42X72 X-RAY (DRAPES) ×6 IMPLANT
DRAPE HALF SHEET 40X57 (DRAPES) ×2 IMPLANT
DRAPE LAPAROTOMY 100X72X124 (DRAPES) ×2 IMPLANT
DRAPE POUCH INSTRU U-SHP 10X18 (DRAPES) ×2 IMPLANT
DRAPE SURG 17X23 STRL (DRAPES) ×8 IMPLANT
DRSG OPSITE POSTOP 4X8 (GAUZE/BANDAGES/DRESSINGS) ×2 IMPLANT
DURAPREP 26ML APPLICATOR (WOUND CARE) ×2 IMPLANT
ELECT REM PT RETURN 9FT ADLT (ELECTROSURGICAL) ×2
ELECTRODE REM PT RTRN 9FT ADLT (ELECTROSURGICAL) ×1 IMPLANT
EVACUATOR 1/8 PVC DRAIN (DRAIN) IMPLANT
GAUZE SPONGE 4X4 12PLY STRL (GAUZE/BANDAGES/DRESSINGS) IMPLANT
GAUZE SPONGE 4X4 16PLY XRAY LF (GAUZE/BANDAGES/DRESSINGS) IMPLANT
GLOVE BIOGEL PI IND STRL 6.5 (GLOVE) ×2 IMPLANT
GLOVE BIOGEL PI IND STRL 7.0 (GLOVE) ×1 IMPLANT
GLOVE BIOGEL PI IND STRL 7.5 (GLOVE) ×1 IMPLANT
GLOVE BIOGEL PI INDICATOR 6.5 (GLOVE) ×2
GLOVE BIOGEL PI INDICATOR 7.0 (GLOVE) ×1
GLOVE BIOGEL PI INDICATOR 7.5 (GLOVE) ×1
GLOVE ECLIPSE 9.0 STRL (GLOVE) ×4 IMPLANT
GLOVE EXAM NITRILE LRG STRL (GLOVE) IMPLANT
GLOVE EXAM NITRILE XL STR (GLOVE) IMPLANT
GLOVE EXAM NITRILE XS STR PU (GLOVE) ×4 IMPLANT
GLOVE OPTIFIT SS 7.5 STRL LX (GLOVE) ×2 IMPLANT
GLOVE SURG SS PI 6.5 STRL IVOR (GLOVE) ×6 IMPLANT
GLOVE SURG SS PI 7.0 STRL IVOR (GLOVE) ×2 IMPLANT
GOWN STRL REUS W/ TWL LRG LVL3 (GOWN DISPOSABLE) ×3 IMPLANT
GOWN STRL REUS W/ TWL XL LVL3 (GOWN DISPOSABLE) ×3 IMPLANT
GOWN STRL REUS W/TWL 2XL LVL3 (GOWN DISPOSABLE) IMPLANT
GOWN STRL REUS W/TWL LRG LVL3 (GOWN DISPOSABLE) ×3
GOWN STRL REUS W/TWL XL LVL3 (GOWN DISPOSABLE) ×3
GRAFT BN 10X1XDBM MAGNIFUSE (Bone Implant) ×1 IMPLANT
GRAFT BONE MAGNIFUSE 1X10CM (Bone Implant) ×1 IMPLANT
KIT BASIN OR (CUSTOM PROCEDURE TRAY) ×2 IMPLANT
KIT ROOM TURNOVER OR (KITS) ×2 IMPLANT
MILL MEDIUM DISP (BLADE) ×2 IMPLANT
NEEDLE HYPO 22GX1.5 SAFETY (NEEDLE) ×2 IMPLANT
NS IRRIG 1000ML POUR BTL (IV SOLUTION) ×2 IMPLANT
OIL CARTRIDGE MAESTRO DRILL (MISCELLANEOUS) ×2
PACK LAMINECTOMY NEURO (CUSTOM PROCEDURE TRAY) ×2 IMPLANT
ROD 70MM (Rod) ×2 IMPLANT
ROD SPNL 70X5.5 NS TI RDS (Rod) ×2 IMPLANT
SCREW 5.75X40M (Screw) ×6 IMPLANT
SCREW 5.75X45MM (Screw) ×2 IMPLANT
SCREW 6.75X30MM (Screw) ×4 IMPLANT
SCREW 6.75X35MM (Screw) IMPLANT
SPACER SPNL STD 23X8XSTRL (Cage) ×2 IMPLANT
SPCR SPNL STD 23X8XSTRL (Cage) ×2 IMPLANT
SPONGE SURGIFOAM ABS GEL 100 (HEMOSTASIS) ×2 IMPLANT
STRIP CLOSURE SKIN 1/2X4 (GAUZE/BANDAGES/DRESSINGS) ×2 IMPLANT
SUT VIC AB 0 CT1 18XCR BRD8 (SUTURE) ×1 IMPLANT
SUT VIC AB 0 CT1 8-18 (SUTURE) ×1
SUT VIC AB 2-0 CT1 18 (SUTURE) ×2 IMPLANT
SUT VIC AB 3-0 SH 8-18 (SUTURE) ×4 IMPLANT
TOWEL GREEN STERILE (TOWEL DISPOSABLE) ×2 IMPLANT
TOWEL GREEN STERILE FF (TOWEL DISPOSABLE) ×2 IMPLANT
TRAY FOLEY CATH 16FRSI W/METER (SET/KITS/TRAYS/PACK) ×2 IMPLANT
TRAY FOLEY W/METER SILVER 16FR (SET/KITS/TRAYS/PACK) ×2 IMPLANT
WATER STERILE IRR 1000ML POUR (IV SOLUTION) ×2 IMPLANT

## 2016-10-07 NOTE — Progress Notes (Signed)
Orthopedic Tech Progress Note Patient Details:  [name] 05/28/1965 [id number] Patient has brace. Patient ID: [name], female   DOB: [birth year], 51 y.o.   MRN: [id number]   Braulio Bosch 10/07/2016, 3:51 PM

## 2016-10-07 NOTE — H&P (Signed)
[name] is an 51 y.o. female.   Chief Complaint: Back pain HPI: 51 year old female with progressive back pain and radiation to her lower extremities left greater than right. Workup demonstrates evidence of instability and stenosis at L4-5 and chronic lytic spondylolisthesis at L5-S1. Patient presents now for two-level decompression and fusion.  Past Medical History:  Diagnosis Date  . Anxiety   . Arthritis   . Asthma   . Bipolar disorder ([place])    currently feeling MANIC- 10/02/2016  . Depression   . GERD (gastroesophageal reflux disease)   . History of blood transfusion    as a newborn   . History of lump of left breast   . Hyperlipidemia   . OSA (obstructive sleep apnea) 01/09/2016   can't afford CPAP  . Personality disorder   . PONV (postoperative nausea and vomiting)   . Post traumatic stress disorder (PTSD)   . Substance abuse   . Synovial cyst     Past Surgical History:  Procedure Laterality Date  . KNEE SURGERY Left    x5, post basketball injury  . LUMBAR LAMINECTOMY/DECOMPRESSION MICRODISCECTOMY Left 01/22/2016   Procedure: Laminectomy for facet/synovial cyst - left - Lumbar four - lumbar five;  Surgeon: Earnie Larsson, MD;  Location: [facility];  Service: Neurosurgery;  Laterality: Left;  Laminectomy for facet/synovial cyst - left - Lumbar four - lumbar five  . SHOULDER SURGERY Left    x2  . TOE SURGERY Bilateral    bone spurs    Family History  Problem Relation Age of Onset  . Heart disease Father   . Hyperlipidemia Father   . Alcohol abuse Brother   . Alcohol abuse Paternal Uncle   . Colon cancer Neg Hx    Social History:  reports that she has quit smoking. Her smoking use included Cigarettes. She has a 1.00 pack-year smoking history. She has never used smokeless tobacco. She reports that she uses drugs, including Marijuana, about 14 times per week. She reports that she does not drink alcohol.  Allergies:  Allergies  Allergen Reactions  . Effexor  [Venlafaxine] Other (See Comments)    PT DOES SIDE EFFECTS  . Lamictal [Lamotrigine] Rash    Medications Prior to Admission  Medication Sig Dispense Refill  . aspirin EC 81 MG tablet Take 81 mg by mouth daily.    Marland Kitchen buPROPion (WELLBUTRIN XL) 300 MG 24 hr tablet Take 1 tablet (300 mg total) by mouth daily. (Patient taking differently: Take 300 mg by mouth daily after breakfast. ) 90 tablet 1  . HYDROcodone-acetaminophen (NORCO) 10-325 MG tablet Take 1-2 tablets by mouth every 4 (four) hours as needed for moderate pain. (Patient taking differently: Take 1-2 tablets by mouth every 6 (six) hours as needed (for pain.). ) 80 tablet 0  . hydrOXYzine (ATARAX/VISTARIL) 50 MG tablet Take 50 mg by mouth every 8 (eight) hours as needed for anxiety.     Marland Kitchen PARoxetine (PAXIL) 40 MG tablet Take 40 mg by mouth every morning.     Marland Kitchen QUEtiapine (SEROQUEL) 50 MG tablet Take 50 mg by mouth 2 (two) times daily as needed. For agitation/anxiety    . simvastatin (ZOCOR) 20 MG tablet Take 1 tablet (20 mg total) by mouth daily. MUST SCHEDULE ANNUAL PHYSICAL 90 tablet 0  . tiZANidine (ZANAFLEX) 4 MG tablet Take 4 mg by mouth every 8 (eight) hours as needed. For pain.    Marland Kitchen albuterol (PROVENTIL HFA;VENTOLIN HFA) 108 (90 Base) MCG/ACT inhaler Inhale 2 puffs into the  lungs every 4 (four) hours as needed for wheezing or shortness of breath. 1 Inhaler 1  . paliperidone (INVEGA) 9 MG 24 hr tablet Take 9 mg by mouth every morning.    Marland Kitchen scopolamine (TRANSDERM-SCOP, 1.5 MG,) 1 MG/3DAYS Place 1 patch (1.5 mg total) onto the skin every 3 (three) days. (Patient not taking: Reported on 09/25/2016) 5 patch 0    No results found for this or any previous visit (from the past 48 hour(s)). No results found.  Pertinent items noted in HPI and remainder of comprehensive ROS otherwise negative.  Height 5' 1.5" (1.562 m), weight 51.7 kg (114 lb).  Patient is awake and alert. She is oriented and appropriate. Her speech is fluent. Judgment and  insight are intact. Cranial nerve function normal bilaterally. Motor examination intact bilaterally sensory examination nonfocal. T10 reflexes normal active. Gait antalgic. Posture mildly flexed. Examination head ears eyes and throat is unremarkable. Chest and abdomen are benign. Extremities are free from injury deformity. Assessment/Plan L4-L5 degenerative spondylolisthesis with stenosis, L5-S1 lytic spondylolisthesis with foraminal stenosis. Plan bilateral L4-5 and L5-S1 decompression and fusion with interbody cages and locally harvested autograft coupled with posterior lateral arthrodesis utilizing segmental pedicle screw fixation and local autografting. Risks and benefits of been explained. Patient wishes to proceed.  Briany Aye A 10/07/2016, 7:53 AM

## 2016-10-07 NOTE — Op Note (Signed)
Date of procedure: 10/07/2016  Date of dictation: Same  Service: Neurosurgery  Preoperative diagnosis: L4-5 degenerative spondylolisthesis with stenosis, L5-S1 lytic grade 1 spondylolisthesis with stenosis  Postoperative diagnosis: Same   Procedure Name:  bilateral L4-5 decompressive laminotomies with foraminotomies, redo, more than would be required for simple interbody fusion alone.  L5-S1 Gill procedure with bilateral L5 and S1 decompressive foraminotomies, more than would be required for simple her body fusion alone  L4-5, L5-S1 posterior lumbar interbody fusion utilizing interbody cages and locally harvested autograft  L4-5 and S1 posterior lateral arthrodesis utilizing segmental pedicle screw fixation and local autograft and morcellized allograft  Surgeon:Etienne Mowers A.Oney Folz, M.D.  Asst. Surgeon: Ditty  Anesthesia: General  Indication: 51 year old female status post previous left-sided decompressive laminotomy and foraminotomy with resection of symptomatic synovial cyst. Patient with known L5-S1 lytic spondylolisthesis. Patient with progressive back and lower extremity symptoms. Workup demonstrates evidence of some instability at L4-5 and increasing instability at L5-S1 with marked foraminal stenosis both levels. Patient presents now for two-level lumbar decompression and fusion.  Operative note: After induction of anesthesia, patient position prone onto Wilson frame and appropriately padded. Lumbar region prepped and draped sterilely. Incision made from L4-S1. Dissection performed bilaterally. Retractor placed. Fluoroscopy used. Levels confirmed. Previous laminotomy at L4-5 on the left was dissected free and epidural scar was resected. Decompressive laminotomies at L4-5 performed bilaterally to remove the inferior two thirds lamina of L4 the entire inferior facets and pars interarticularis bilaterally. Majority of the superior facet of L5 was resected. Ligament flavum and epidural scar was  further resected. Foraminotomies were completed along the course the exiting L4 and L5 nerve roots inaccessible than necessary for interbody fusion. Gill procedure was then performed at L5-S1. The entire lamina and inferior facet complex of L5 was removed bilaterally. Residual hook of inferior facet of L5 was resected. Foraminotomies were completed on course exiting L5 nerve roots bilaterally. Ligament flavum elevated and resected. Medial facetectomies of the superior articular processes of S1 were performed bilaterally. Bilateral discectomies were then performed at L4-5. With the distractor placed the patient's right side at L4-5. Disc spaces and reamed and soft tissue was removed and interspace. A 9 mm extra lordotic Medtronic expandable implant packed with locally harvested autograft was then impacted into place and expanded to its full extent. Distractor was removed patient's right side. Disc space once again prepared for interbody fusion. Morselize autograft packed in the interspace. A second cage packed with autograft was impacted in place and expanded to its full extent. Procedures and repeated L5-S1 which was more, located secondary to the spondylolisthesis. 8 mm standard implants are placed bilaterally along with morselized autograft. Pedicles of L4-L5 and S1 were notified using surface landmarks and intraoperative fluoroscopy. Sufficient bone from the pedicle was removed using high-speed drill. Each pedicles and probed using pedicle awl each pedicle awl track was then probed and found to be solidly within bone. 5.75 mm radius brand screws from [employer] were placed bilaterally at L4 and L5. 6.75 mm screws were placed bilat at S1.  Final images revealed good position the bone grafts and hardware proper upper level with normal lamina spine. Wounds and irrigated one final time. Gelfoam was placed over the laminotomy defects. Short segment titanium rods placed or the curettes were L4 to L5 and S1.  Locking caps of his screws were locking caps engaged. Morselized autograft and magnetic diffuse packets were placed bilaterally. Vancomycin powder was placed the deep wound space. Wounds and close in layers of Vicryl  sutures. Steri-Strips sterile dressing were applied. No apparent competitions. Patient tolerated the procedure well and she returns to the recovery room postop.

## 2016-10-07 NOTE — Anesthesia Postprocedure Evaluation (Signed)
Anesthesia Post Note  Patient: [name]  Procedure(s) Performed: Procedure(s) (LRB): Posterior Lumbar Four-Five/Lumbar Five-Sacral One Interbody and Fusion (N/A)     Patient location during evaluation: PACU Anesthesia Type: General Level of consciousness: awake and alert Pain management: pain level controlled Vital Signs Assessment: post-procedure vital signs reviewed and stable Respiratory status: spontaneous breathing, nonlabored ventilation, respiratory function stable and patient connected to nasal cannula oxygen Cardiovascular status: blood pressure returned to baseline and stable Postop Assessment: no signs of nausea or vomiting Anesthetic complications: no    Last Vitals:  Vitals:   10/07/16 1234 10/07/16 1300  BP: 132/74 138/74  Pulse: 86 75  Resp: 18 17  Temp: 36.7 C 36.7 C    Last Pain:  Vitals:   10/07/16 1337  TempSrc:   PainSc: 10-Worst pain ever                 [name]

## 2016-10-07 NOTE — Anesthesia Procedure Notes (Addendum)
Procedure Name: Intubation Date/Time: 10/07/2016 8:19 AM Performed by: Scheryl Darter Pre-anesthesia Checklist: Patient identified, Emergency Drugs available, Suction available and Patient being monitored Patient Re-evaluated:Patient Re-evaluated prior to induction Oxygen Delivery Method: Circle System Utilized Preoxygenation: Pre-oxygenation with 100% oxygen Induction Type: IV induction Ventilation: Mask ventilation without difficulty Laryngoscope Size: Miller and 2 Grade View: Grade I Tube type: Oral Tube size: 7.5 mm Number of attempts: 1 Airway Equipment and Method: Stylet and Oral airway Placement Confirmation: ETT inserted through vocal cords under direct vision,  positive ETCO2 and breath sounds checked- equal and bilateral Tube secured with: Tape Dental Injury: Teeth and Oropharynx as per pre-operative assessment

## 2016-10-07 NOTE — Anesthesia Preprocedure Evaluation (Addendum)
Anesthesia Evaluation  Patient identified by MRN, date of birth, ID band Patient awake    Reviewed: Allergy & Precautions, NPO status , Patient's Chart, lab work & pertinent test results  History of Anesthesia Complications (+) PONVNegative for: history of anesthetic complications  Airway Mallampati: II  TM Distance: >3 FB Neck ROM: Full    Dental  (+) Teeth Intact   Pulmonary asthma , Current Smoker, former smoker,    breath sounds clear to auscultation       Cardiovascular negative cardio ROS   Rhythm:Regular     Neuro/Psych PSYCHIATRIC DISORDERS Anxiety Depression Bipolar Disorder  Neuromuscular disease    GI/Hepatic GERD  Controlled,(+)     substance abuse  marijuana use,   Endo/Other  negative endocrine ROS  Renal/GU      Musculoskeletal  (+) Arthritis ,   Abdominal   Peds  Hematology negative hematology ROS (+)   Anesthesia Other Findings Low back pain  Reproductive/Obstetrics                            Anesthesia Physical  Anesthesia Plan  ASA: II  Anesthesia Plan: General   Post-op Pain Management:    Induction: Intravenous  PONV Risk Score and Plan: 4 or greater and Ondansetron, Dexamethasone, Midazolam, Scopolamine patch - Pre-op and Propofol infusion  Airway Management Planned: Oral ETT  Additional Equipment: None  Intra-op Plan:   Post-operative Plan: Extubation in OR  Informed Consent: I have reviewed the patients History and Physical, chart, labs and discussed the procedure including the risks, benefits and alternatives for the proposed anesthesia with the patient or authorized representative who has indicated his/her understanding and acceptance.   Dental advisory given  Plan Discussed with: CRNA and Surgeon  Anesthesia Plan Comments:         Anesthesia Quick Evaluation  

## 2016-10-07 NOTE — Transfer of Care (Signed)
Immediate Anesthesia Transfer of Care Note  Patient: [name]  Procedure(s) Performed: Procedure(s): Posterior Lumbar Four-Five/Lumbar Five-Sacral One Interbody and Fusion (N/A)  Patient Location: PACU  Anesthesia Type:General  Level of Consciousness: awake, alert , oriented and sedated  Airway & Oxygen Therapy: Patient Spontanous Breathing and Patient connected to nasal cannula oxygen  Post-op Assessment: Report given to RN, Post -op Vital signs reviewed and stable and Patient moving all extremities  Post vital signs: Reviewed and stable  Last Vitals:  Vitals:   10/07/16 1140  BP: (!) 157/78  Pulse: 100  Resp: (!) 22  Temp: 36.7 C    Last Pain:  Vitals:   10/07/16 1140  PainSc: (P) Asleep         Complications: No apparent anesthesia complications

## 2016-10-07 NOTE — Brief Op Note (Signed)
10/07/2016  11:27 AM  PATIENT:  [name]  51 y.o. female  PRE-OPERATIVE DIAGNOSIS:  Spondylolisthesis  POST-OPERATIVE DIAGNOSIS:  Spondylolisthesis  PROCEDURE:  Procedure(s): Posterior Lumbar Four-Five/Lumbar Five-Sacral One Interbody and Fusion (N/A)  SURGEON:  Surgeon(s) and Role:    * Earnie Larsson, MD - Primary    * Ditty, Kevan Ny, MD - Assisting  PHYSICIAN ASSISTANT:   ASSISTANTS:    ANESTHESIA:   general  EBL:  Total I/O In: 1400 [I.V.:1400] Out: 475 [Urine:250; Blood:225]  BLOOD ADMINISTERED:none  DRAINS: none   LOCAL MEDICATIONS USED:  MARCAINE     SPECIMEN:  No Specimen  DISPOSITION OF SPECIMEN:  N/A  COUNTS:  YES  TOURNIQUET:  * No tourniquets in log *  DICTATION: .Dragon Dictation  PLAN OF CARE: Admit to inpatient   PATIENT DISPOSITION:  PACU - hemodynamically stable.   Delay start of Pharmacological VTE agent (>24hrs) due to surgical blood loss or risk of bleeding: yes

## 2016-10-07 NOTE — Evaluation (Signed)
Physical Therapy Evaluation Patient Details Name: [name] MRN: [id number] DOB: 1965-08-02 Today's Date: 10/07/2016   History of Present Illness  Pt is a 51 y/o female s/p L4-S1 PLIF. PMH includes anxiety, asthma, PTSD, bipolar disorder, OSA, back surgery, L knee surgery, and L shoulder surgery.   Clinical Impression  Pt is s/p surgery above with deficits below. PTA, pt was independent with functional mobility. Upon eval, pt very limited by post op pain, as well as, weakness, and decreased balance. Pt requiring min A for functional mobility this session. Pt reports family will be available to assist as needed upon d/c. No follow up recommendations and has all DME recommendations. Will continue to follow acutely to maximize functional mobility independence.     Follow Up Recommendations No PT follow up;Supervision for mobility/OOB    Equipment Recommendations  None recommended by PT    Recommendations for Other Services       Precautions / Restrictions Precautions Precautions: Back Precaution Booklet Issued: Yes (comment) Precaution Comments: Reviewed back precaution handout with pt.  Required Braces or Orthoses: Spinal Brace Spinal Brace: Lumbar corset;Applied in sitting position Restrictions Weight Bearing Restrictions: No      Mobility  Bed Mobility Overal bed mobility: Needs Assistance Bed Mobility: Rolling;Sidelying to Sit;Sit to Sidelying Rolling: Min assist Sidelying to sit: Min assist     Sit to sidelying: Min assist General bed mobility comments: Min A for bed mobility for trunk elevation and for LE assist back to bed. Verbal cues for use of log roll technique.   Transfers Overall transfer level: Needs assistance Equipment used: 1 person hand held assist Transfers: Sit to/from Stand Sit to Stand: Min assist         General transfer comment: Min A for lift assist secondary to pain.   Ambulation/Gait Ambulation/Gait assistance: Min guard Ambulation  Distance (Feet): 50 Feet Assistive device: 1 person hand held assist (IV pole ) Gait Pattern/deviations: Step-through pattern;Decreased stride length;Antalgic;Narrow base of support Gait velocity: Decreased Gait velocity interpretation: Below normal speed for age/gender General Gait Details: Slow, antalgic gait secondary to post op pain. Reporting pain in LLE. Unsteady gait and required use of BUE support. Limited distance secondary to pain.   Stairs            Wheelchair Mobility    Modified Rankin (Stroke Patients Only)       Balance Overall balance assessment: Needs assistance Sitting-balance support: Feet supported;Bilateral upper extremity supported Sitting balance-Leahy Scale: Fair     Standing balance support: Bilateral upper extremity supported;During functional activity Standing balance-Leahy Scale: Poor Standing balance comment: Reliant on BUE support for balance.                              Pertinent Vitals/Pain Pain Assessment: 0-10 Pain Score: 7  Pain Location: back and LLE  Pain Descriptors / Indicators: Aching;Constant;Operative site guarding Pain Intervention(s): Limited activity within patient's tolerance;Monitored during session;Repositioned    Home Living Family/patient expects to be discharged to:: Private residence Living Arrangements: Spouse/significant other Available Help at Discharge: Family;Available PRN/intermittently Type of Home: House Home Access: Stairs to enter Entrance Stairs-Rails: Psychiatric nurse of Steps: 5 Home Layout: One level Home Equipment: Walker - 2 wheels;Cane - single point;Shower seat;Bedside commode      Prior Function Level of Independence: Independent               Hand Dominance  Extremity/Trunk Assessment   Upper Extremity Assessment Upper Extremity Assessment: Defer to OT evaluation    Lower Extremity Assessment Lower Extremity Assessment: Generalized  weakness;LLE deficits/detail LLE Deficits / Details: pain from hip to toes.     Cervical / Trunk Assessment Cervical / Trunk Assessment: Other exceptions Cervical / Trunk Exceptions: s/p surgery   Communication   Communication: No difficulties  Cognition Arousal/Alertness: Lethargic;Suspect due to medications Behavior During Therapy: [facility] for tasks assessed/performed Overall Cognitive Status: Within Functional Limits for tasks assessed                                        General Comments General comments (skin integrity, edema, etc.): Pt family present throughout session. Educated about generalized walking program to perform throughout the day.     Exercises     Assessment/Plan    PT Assessment Patient needs continued PT services  PT Problem List Decreased strength;Decreased activity tolerance;Decreased balance;Decreased mobility;Decreased knowledge of use of DME;Pain;Decreased knowledge of precautions       PT Treatment Interventions DME instruction;Stair training;Gait training;Functional mobility training;Therapeutic activities;Therapeutic exercise;Balance training;Neuromuscular re-education;Patient/family education    PT Goals (Current goals can be found in the Care Plan section)  Acute Rehab PT Goals Patient Stated Goal: to decrease pain  PT Goal Formulation: With patient Time For Goal Achievement: 10/14/16 Potential to Achieve Goals: Good    Frequency Min 5X/week   Barriers to discharge        Co-evaluation               AM-PAC PT "6 Clicks" Daily Activity  Outcome Measure Difficulty turning over in bed (including adjusting bedclothes, sheets and blankets)?: Total Difficulty moving from lying on back to sitting on the side of the bed? : Total Difficulty sitting down on and standing up from a chair with arms (e.g., wheelchair, bedside commode, etc,.)?: Total Help needed moving to and from a bed to chair (including a wheelchair)?: A  Little Help needed walking in hospital room?: A Little Help needed climbing 3-5 steps with a railing? : A Lot 6 Click Score: 11    End of Session Equipment Utilized During Treatment: Back brace;Gait belt Activity Tolerance: Patient limited by pain Patient left: in bed;with call bell/phone within reach;with family/visitor present Nurse Communication: Mobility status PT Visit Diagnosis: Other abnormalities of gait and mobility (R26.89);Pain Pain - Right/Left: Left Pain - part of body: Leg (back )    Time: 8250-5397 PT Time Calculation (min) (ACUTE ONLY): 17 min   Charges:   PT Evaluation $PT Eval Moderate Complexity: 1 Mod     PT G Codes:        Leighton Ruff, PT, DPT  Acute Rehabilitation Services  Pager: [phone]   Rudean Hitt 10/07/2016, 3:22 PM

## 2016-10-08 MED ORDER — GABAPENTIN 600 MG PO TABS
300.0000 mg | ORAL_TABLET | Freq: Three times a day (TID) | ORAL | Status: DC
  Administered 2016-10-08 – 2016-10-09 (×4): 300 mg via ORAL
  Filled 2016-10-08 (×4): qty 1

## 2016-10-08 NOTE — Evaluation (Addendum)
Occupational Therapy Evaluation and  Discharge Patient Details Name: [name] MRN: [id number] DOB: 05-09-1965 Today's Date: 10/08/2016    History of Present Illness Pt is a 51 y/o female s/p L4-S1 PLIF. PMH includes anxiety, asthma, PTSD, bipolar disorder, OSA, back surgery, L knee surgery, and L shoulder surgery.    Clinical Impression   This 51 yo female admitted and underwent above presents to acute OT with all acute education completed, but will benefit from follow up OT at home to reinforce safety and independence with basic ADLs following back precautions. Pt to D/C today, so will D/C from acute OT.     Follow Up Recommendations  Home health OT;Supervision/Assistance - 24 hour    Equipment Recommendations  3 in 1 bedside commode       Precautions / Restrictions Precautions Precautions: Back Precaution Comments: Reviewed back precaution handout with pt.  (pt able to state back precautions at end of session) Required Braces or Orthoses: Spinal Brace Spinal Brace: Lumbar corset;Applied in sitting position Restrictions Weight Bearing Restrictions: No      Mobility Bed Mobility Overal bed mobility: Needs Assistance Bed Mobility: Rolling;Sidelying to Sit;Sit to Sidelying Rolling: Supervision Sidelying to sit: Supervision     Sit to sidelying: Supervision General bed mobility comments: VCs for proper way to get back into bed  Transfers Overall transfer level: Needs assistance Equipment used: Rolling walker (2 wheeled) Transfers: Sit to/from Stand Sit to Stand: Supervision              Balance Overall balance assessment: (P) Needs assistance Sitting-balance support: (P) Feet supported;Bilateral upper extremity supported Sitting balance-Leahy Scale: (P) Fair     Standing balance support: (P) Bilateral upper extremity supported;During functional activity Standing balance-Leahy Scale: (P) Poor Standing balance comment: (P) Reliant on BUE support for balance.                             ADL either performed or assessed with clinical judgement   ADL                                         General ADL Comments: Pt is able to cross RLE over LLE to get to sock, but not vise versa (reports her mom can help her)--I re-iterated to her that she could not bend forward to do her own socks and she verbalized understanding. Educated pt on use of wet wipes for back peri-care post bowel movement to avoid twisting. Educated pt on use of 2 cups for brushing teeth (one to spit and one to rinse to avoid bending over sink). Pt able to don/doff brace once I handed it to her. Min guard A for transfer to 3n1 in bathroom     Vision Patient Visual Report: No change from baseline              Pertinent Vitals/Pain Pain Assessment: 0-10 Pain Score: 7  Pain Location: LLE  Pain Descriptors / Indicators: Guarding Pain Intervention(s): Limited activity within patient's tolerance;Monitored during session;Repositioned;Premedicated before session     Hand Dominance Right   Extremity/Trunk Assessment Upper Extremity Assessment Upper Extremity Assessment: Overall WFL for tasks assessed           Communication Communication Communication: No difficulties   Cognition Arousal/Alertness: Lethargic;Suspect due to medications Behavior During Therapy: Impulsive  General Comments: Pt with decreased follow through with back precautions for getting back in bed (had to be cued to come down on her side--she started to just lay straight back)              Home Living Family/patient expects to be discharged to:: Private residence Living Arrangements: Parent Available Help at Discharge: Family;Available 24 hours/day Type of Home: House Home Access: Stairs to enter [employer] of Steps: 5 Entrance Stairs-Rails: Right;Left Home Layout: One level     Bathroom Shower/Tub: Emergency planning/management officer: Standard     Home Equipment: Environmental consultant - 2 wheels;Cane - single point;Shower seat;Bedside commode;Hand held shower head          Prior Functioning/Environment Level of Independence: Independent                 OT Problem List: Decreased strength;Decreased range of motion;Impaired balance (sitting and/or standing);Pain         OT Goals(Current goals can be found in the care plan section) Acute Rehab OT Goals Patient Stated Goal: to lay back down (due to pain)  OT Frequency:                AM-PAC PT "6 Clicks" Daily Activity     Outcome Measure Help from another person eating meals?: None Help from another person taking care of personal grooming?: A Little Help from another person toileting, which includes using toliet, bedpan, or urinal?: A Little Help from another person bathing (including washing, rinsing, drying)?: A Little Help from another person to put on and taking off regular upper body clothing?: A Little Help from another person to put on and taking off regular lower body clothing?: A Lot 6 Click Score: 18   End of Session Equipment Utilized During Treatment: Rolling walker;Back brace Nurse Communication:  (Needs 3n1 and HHOT)  Activity Tolerance: Patient limited by pain Patient left: in bed;with call bell/phone within reach  OT Visit Diagnosis: Unsteadiness on feet (R26.81);Pain Pain - Right/Left: Left Pain - part of body: Leg                Time: 3500-9381 OT Time Calculation (min): 14 min Charges:  OT General Charges $OT Visit: 1 Procedure OT Evaluation $OT Eval Moderate Complexity: 1 Procedure [place], OTR/L [phone] 10/08/2016

## 2016-10-08 NOTE — Progress Notes (Signed)
Postoperative 1. Patient still with some left lower extremity radicular pain. No numbness or weakness. Ambulating with assistance. Cognitive bowel and bladder.  Wound clean and dry. Chest abdomen benign.  Progressing slowly following 2 level lumbar decompression and fusion. Add Neurontin for radicular pain. Continue efforts at mobilization. Possible discharge tomorrow.

## 2016-10-08 NOTE — Progress Notes (Signed)
Physical Therapy Treatment Patient Details Name: [name] MRN: [id number] DOB: 1965-12-01 Today's Date: 10/08/2016    History of Present Illness Pt is a 51 y/o female s/p L4-S1 PLIF. PMH includes anxiety, asthma, PTSD, bipolar disorder, OSA, back surgery, L knee surgery, and L shoulder surgery.     PT Comments    Patient seen for mobilityprogression s/p spinal surgery. Mobilizing fairly well. Educated patient on precautions, mobility expectations, safety and car transfers.    Follow Up Recommendations  No PT follow up;Supervision for mobility/OOB     Equipment Recommendations  None recommended by PT    Recommendations for Other Services       Precautions / Restrictions Precautions Precautions: Back Precaution Comments: Reviewed back precaution handout with pt.  (pt able to state back precautions at end of session) Required Braces or Orthoses: Spinal Brace Spinal Brace: Lumbar corset;Applied in sitting position Restrictions Weight Bearing Restrictions: No    Mobility  Bed Mobility Overal bed mobility: Needs Assistance Bed Mobility: Rolling;Sidelying to Sit;Sit to Sidelying Rolling: Supervision Sidelying to sit: Supervision     Sit to sidelying: Supervision General bed mobility comments: VCs for proper way to get back into bed  Transfers Overall transfer level: Needs assistance Equipment used: Rolling walker (2 wheeled) Transfers: Sit to/from Stand Sit to Stand: Supervision            Ambulation/Gait Ambulation/Gait assistance: Supervision Ambulation Distance (Feet): 100 Feet Assistive device: Rolling walker (2 wheeled) Gait Pattern/deviations: Step-through pattern;Decreased stride length;Antalgic;Narrow base of support Gait velocity: Decreased Gait velocity interpretation: Below normal speed for age/gender General Gait Details: Slow, antalgic gait secondary to post op pain. Reporting pain in LLE. Unsteady gait and required use of BUE support. Limited  distance secondary to pain.    Stairs Stairs: Yes   Stair Management: One rail Left;Forwards;Step to pattern Number of Stairs: 4 General stair comments: performed well, no physical assist required, cues for sequencing and technique  Wheelchair Mobility    Modified Rankin (Stroke Patients Only)       Balance Overall balance assessment: Needs assistance Sitting-balance support: Feet supported;Bilateral upper extremity supported Sitting balance-Leahy Scale: Fair     Standing balance support: Bilateral upper extremity supported;During functional activity Standing balance-Leahy Scale: Poor Standing balance comment: Reliant on BUE support for balance.                             Cognition Arousal/Alertness: Lethargic;Suspect due to medications Behavior During Therapy: Impulsive                                   General Comments: Pt with decreased follow through with back precautions for getting back in bed (had to be cued to come down on her side--she started to just lay straight back)      Exercises      General Comments        Pertinent Vitals/Pain Pain Assessment: 0-10 Pain Score: 7  Pain Location: LLE  Pain Descriptors / Indicators: Guarding Pain Intervention(s): Limited activity within patient's tolerance;Monitored during session;Repositioned;Premedicated before session    Home Living Family/patient expects to be discharged to:: Private residence Living Arrangements: Parent Available Help at Discharge: Family;Available 24 hours/day Type of Home: House Home Access: Stairs to enter Entrance Stairs-Rails: Right;Left Home Layout: One level Home Equipment: Environmental consultant - 2 wheels;Cane - single point;Shower seat;Bedside commode;Hand held shower head  Prior Function Level of Independence: Independent          PT Goals (current goals can now be found in the care plan section) Acute Rehab PT Goals Patient Stated Goal: to lay back down  (due to pain) PT Goal Formulation: With patient Time For Goal Achievement: 10/14/16 Potential to Achieve Goals: Good Progress towards PT goals: Progressing toward goals    Frequency    Min 5X/week      PT Plan Current plan remains appropriate    Co-evaluation              AM-PAC PT "6 Clicks" Daily Activity  Outcome Measure  Difficulty turning over in bed (including adjusting bedclothes, sheets and blankets)?: Total Difficulty moving from lying on back to sitting on the side of the bed? : Total Difficulty sitting down on and standing up from a chair with arms (e.g., wheelchair, bedside commode, etc,.)?: Total Help needed moving to and from a bed to chair (including a wheelchair)?: A Little Help needed walking in hospital room?: A Little Help needed climbing 3-5 steps with a railing? : A Little 6 Click Score: 12    End of Session Equipment Utilized During Treatment: Back brace;Gait belt Activity Tolerance: Patient limited by pain Patient left: in bed;with call bell/phone within reach Nurse Communication: Mobility status PT Visit Diagnosis: Other abnormalities of gait and mobility (R26.89);Pain Pain - Right/Left: Left Pain - part of body: Leg (back )     Time: 7253-6644 PT Time Calculation (min) (ACUTE ONLY): 15 min  Charges:  $Gait Training: 8-22 mins                    G Codes:       Alben Deeds, PT DPT  Board Certified Neurologic Specialist [place] 10/08/2016, 9:14 AM

## 2016-10-09 MED ORDER — GABAPENTIN 600 MG PO TABS: 300.0000 mg | ORAL_TABLET | Freq: Three times a day (TID) | ORAL | 0 refills | Status: DC

## 2016-10-09 MED ORDER — HYDROCODONE-ACETAMINOPHEN 10-325 MG PO TABS: 1.0000 | ORAL_TABLET | ORAL | 0 refills | Status: DC | PRN

## 2016-10-09 MED ORDER — DIAZEPAM 5 MG PO TABS: 5.0000 mg | ORAL_TABLET | Freq: Four times a day (QID) | ORAL | 0 refills | Status: DC | PRN

## 2016-10-09 NOTE — Progress Notes (Signed)
Physical Therapy Treatment Patient Details Name: [name] MRN: [id number] DOB: Apr 10, 1965 Today's Date: 10/09/2016    History of Present Illness Pt is a 51 y/o female s/p L4-S1 PLIF. PMH includes anxiety, asthma, PTSD, bipolar disorder, OSA, back surgery, L knee surgery, and L shoulder surgery.     PT Comments    Pt agreeable to mobilize with PT. Pt required reminders of back precautions, but was able to don and doff back brace without assistance. Pt noted pain in Lt LE and was hesitant to bear much weight on that extremity during ambulation with RW in hall. Pt required VCs for decreased WB through UEs on RW and posturing. Pt's mobility continues to be limited due to pain and PT will continue to follow acutely for mobilization, increased activity tolerance, and reinforcement of back precautions prior to discharge.    Follow Up Recommendations  No PT follow up;Supervision for mobility/OOB     Equipment Recommendations  None recommended by PT    Recommendations for Other Services       Precautions / Restrictions Precautions Precautions: Back Precaution Comments: Pt able to recall most back precautions without cueing and pt re-educated on precautions for carryover  Required Braces or Orthoses: Spinal Brace Spinal Brace: Lumbar corset;Applied in sitting position Restrictions Weight Bearing Restrictions: No    Mobility  Bed Mobility Overal bed mobility: Needs Assistance Bed Mobility: Rolling;Sidelying to Sit;Sit to Sidelying Rolling: Supervision Sidelying to sit: Supervision     Sit to sidelying: Supervision General bed mobility comments: Supervision for safety. Pt able to perform good rolling technique without cues; VCs for sequencing of scooting up in bed   Transfers Overall transfer level: Needs assistance   Transfers: Sit to/from Stand Sit to Stand: Supervision         General transfer comment: Supervision for safety   Ambulation/Gait Ambulation/Gait  assistance: Min guard Ambulation Distance (Feet): 400 Feet Assistive device: Rolling walker (2 wheeled) Gait Pattern/deviations: Step-through pattern;Decreased stride length;Antalgic;Decreased weight shift to left;Trunk flexed Gait velocity: Decreased Gait velocity interpretation: Below normal speed for age/gender General Gait Details: pt with slow, ataxic gait and decreased weight bearing on Lt LE due to pain. Pt with increased weight pushing through UEs on RW to decrease amount of weight bearing. VCs for increased WB in LEs, posturing and relaxation of shoulders.    Stairs            Wheelchair Mobility    Modified Rankin (Stroke Patients Only)       Balance Overall balance assessment: Needs assistance Sitting-balance support: Feet supported;Bilateral upper extremity supported Sitting balance-Leahy Scale: Fair     Standing balance support: Bilateral upper extremity supported;During functional activity Standing balance-Leahy Scale: Poor Standing balance comment: Reliant on BUE support using RW for balance                             Cognition Arousal/Alertness: Awake/alert Behavior During Therapy: WFL for tasks assessed/performed Overall Cognitive Status: Within Functional Limits for tasks assessed                                        Exercises      General Comments        Pertinent Vitals/Pain Pain Assessment: 0-10 Pain Score: 7  Pain Location: LLE  Pain Descriptors / Indicators: Aching;Constant;Discomfort Pain Intervention(s): Limited activity within patient's tolerance;Monitored  during session;Repositioned    Home Living                      Prior Function            PT Goals (current goals can now be found in the care plan section) Acute Rehab PT Goals Patient Stated Goal: decrease pain PT Goal Formulation: With patient Time For Goal Achievement: 10/23/16 Potential to Achieve Goals: Good Progress towards PT  goals: Progressing toward goals    Frequency    Min 5X/week      PT Plan Current plan remains appropriate    Co-evaluation              AM-PAC PT "6 Clicks" Daily Activity  Outcome Measure  Difficulty turning over in bed (including adjusting bedclothes, sheets and blankets)?: A Little Difficulty moving from lying on back to sitting on the side of the bed? : Total Difficulty sitting down on and standing up from a chair with arms (e.g., wheelchair, bedside commode, etc,.)?: A Little Help needed moving to and from a bed to chair (including a wheelchair)?: A Little Help needed walking in hospital room?: A Little Help needed climbing 3-5 steps with a railing? : A Little 6 Click Score: 16    End of Session Equipment Utilized During Treatment: Back brace;Gait belt Activity Tolerance: Patient limited by pain Patient left: in bed;with call bell/phone within reach   PT Visit Diagnosis: Other abnormalities of gait and mobility (R26.89);Pain;Difficulty in walking, not elsewhere classified (R26.2) Pain - Right/Left: Left Pain - part of body: Leg     Time: 2836-6294 PT Time Calculation (min) (ACUTE ONLY): 18 min  Charges:  $Gait Training: 8-22 mins                    G Codes:       Elberta Leatherwood, SPT Acute Rehab [name] 10/09/2016, 1:31 PM

## 2016-10-09 NOTE — Discharge Instructions (Signed)

## 2016-10-09 NOTE — Progress Notes (Signed)
Patient is discharged from room 3C07 at this time. Alert and in stable condition. IV site d/c'd and instructions read to patient and dad and understanding verbalized. Left unit via wheelchair with all belongings at side.

## 2016-10-09 NOTE — Discharge Summary (Signed)
Physician Discharge Summary  Patient ID: [name] MRN: [id number] DOB/AGE: 1965/09/15 51 y.o.  Admit date: 10/07/2016 Discharge date: 10/09/2016  Admission Diagnoses:  Discharge Diagnoses:  Active Problems:   Spondylolisthesis at L5-S1 level   Discharged Condition: good  Hospital Course: Patient admitted to the hospital where she underwent an uncomplicated lumbar decompression and fusion at L4-5 and L5-S1. Postoperative patient is doing reasonably well. She has some residual left lower extremity pain. She is ambulating well with therapy. She has no weakness. Wound is healing well. Ready for discharge home.  Consults:   Significant Diagnostic Studies:   Treatments:   Discharge Exam: Blood pressure (!) 152/87, pulse 98, temperature 98.8 F (37.1 C), temperature source Oral, resp. rate 16, height 5' 1.5" (1.562 m), weight 51.7 kg (114 lb), SpO2 100 %. Patient is awake and alert. She is oriented and appropriate. Her speech is fluent. Judgment and insight are intact. She appears reasonably comfortable. Motor examination intact bilaterally sensory examination nonfocal. Deep tendon reflexes normal active. No evidence of long track signs. Chest and abdomen benign.  Disposition: 01-Home or Self Care   Allergies as of 10/09/2016      Reactions   Effexor [venlafaxine] Other (See Comments)   PT DOES SIDE EFFECTS   Lamictal [lamotrigine] Rash      Medication List    TAKE these medications   albuterol 108 (90 Base) MCG/ACT inhaler Commonly known as:  PROVENTIL HFA;VENTOLIN HFA Inhale 2 puffs into the lungs every 4 (four) hours as needed for wheezing or shortness of breath.   aspirin EC 81 MG tablet Take 81 mg by mouth daily.   buPROPion 300 MG 24 hr tablet Commonly known as:  WELLBUTRIN XL Take 1 tablet (300 mg total) by mouth daily. What changed:  when to take this   diazepam 5 MG tablet Commonly known as:  VALIUM Take 1-2 tablets (5-10 mg total) by mouth every 6 (six)  hours as needed for muscle spasms.   gabapentin 600 MG tablet Commonly known as:  NEURONTIN Take 0.5 tablets (300 mg total) by mouth 3 (three) times daily.   HYDROcodone-acetaminophen 10-325 MG tablet Commonly known as:  NORCO Take 1-2 tablets by mouth every 4 (four) hours as needed for moderate pain. What changed:  when to take this  reasons to take this   hydrOXYzine 50 MG tablet Commonly known as:  ATARAX/VISTARIL Take 50 mg by mouth every 8 (eight) hours as needed for anxiety.   paliperidone 9 MG 24 hr tablet Commonly known as:  INVEGA Take 9 mg by mouth every morning.   PARoxetine 40 MG tablet Commonly known as:  PAXIL Take 40 mg by mouth every morning.   QUEtiapine 50 MG tablet Commonly known as:  SEROQUEL Take 50 mg by mouth 2 (two) times daily as needed. For agitation/anxiety   scopolamine 1 MG/3DAYS Commonly known as:  TRANSDERM-SCOP (1.5 MG) Place 1 patch (1.5 mg total) onto the skin every 3 (three) days.   simvastatin 20 MG tablet Commonly known as:  ZOCOR Take 1 tablet (20 mg total) by mouth daily. MUST SCHEDULE ANNUAL PHYSICAL   tiZANidine 4 MG tablet Commonly known as:  ZANAFLEX Take 4 mg by mouth every 8 (eight) hours as needed. For pain.            Durable Medical Equipment        Start     Ordered   10/07/16 1258  DME Walker rolling  Once    Question:  Patient  needs a walker to treat with the following condition  Answer:  Spondylolisthesis at L5-S1 level   10/07/16 1257   10/07/16 1258  DME 3 n 1  Once     10/07/16 1257       Signed: Lalana Wachter A 10/09/2016, 9:39 AM

## 2016-10-21 ENCOUNTER — Other Ambulatory Visit: Payer: Self-pay | Admitting: Neurosurgery

## 2016-10-21 DIAGNOSIS — M4317 Spondylolisthesis, lumbosacral region: Secondary | ICD-10-CM | POA: Diagnosis not present

## 2016-10-23 ENCOUNTER — Encounter (HOSPITAL_COMMUNITY): Payer: Self-pay | Admitting: *Deleted

## 2016-10-23 NOTE — Progress Notes (Signed)
Pt denies SOB, chest pain, and being under the care of a cardiologist. Pt denies having a stress test, echo and cardiac cath. Pt denies having an EKG within the last year. Pt made aware to stop taking  Aspirin, vitamins, fish oil and herbal medications. Do not take any NSAIDs ie: Ibuprofen, Advil, Naproxen (Aleve), Motrin, BC and Goody Powder or any medication containing Aspirin. Pt verbalized understanding of all pre-op instructions.

## 2016-10-27 MED ORDER — CEFAZOLIN SODIUM-DEXTROSE 2-4 GM/100ML-% IV SOLN
2.0000 g | INTRAVENOUS | Status: AC
  Administered 2016-10-28: 2 g via INTRAVENOUS
  Filled 2016-10-27: qty 100

## 2016-10-27 MED ORDER — DEXAMETHASONE SODIUM PHOSPHATE 10 MG/ML IJ SOLN
10.0000 mg | INTRAMUSCULAR | Status: AC
  Administered 2016-10-28: 10 mg via INTRAVENOUS
  Filled 2016-10-27: qty 1

## 2016-10-27 NOTE — Anesthesia Preprocedure Evaluation (Addendum)
Anesthesia Evaluation  Patient identified by MRN, date of birth, ID band Patient awake    Reviewed: Allergy & Precautions, NPO status , Patient's Chart, lab work & pertinent test results  History of Anesthesia Complications (+) PONVNegative for: history of anesthetic complications  Airway Mallampati: II  TM Distance: >3 FB Neck ROM: Full    Dental  (+) Teeth Intact   Pulmonary asthma , Current Smoker, former smoker,    breath sounds clear to auscultation       Cardiovascular negative cardio ROS   Rhythm:Regular     Neuro/Psych PSYCHIATRIC DISORDERS Anxiety Depression Bipolar Disorder  Neuromuscular disease    GI/Hepatic GERD  Controlled,(+)     substance abuse  marijuana use,   Endo/Other  negative endocrine ROS  Renal/GU      Musculoskeletal  (+) Arthritis ,   Abdominal   Peds  Hematology negative hematology ROS (+)   Anesthesia Other Findings Low back pain  Reproductive/Obstetrics                            Anesthesia Physical  Anesthesia Plan  ASA: II  Anesthesia Plan: General   Post-op Pain Management:    Induction: Intravenous  PONV Risk Score and Plan: 4 or greater and Ondansetron, Dexamethasone, Midazolam, Scopolamine patch - Pre-op and Propofol infusion  Airway Management Planned: Oral ETT  Additional Equipment: None  Intra-op Plan:   Post-operative Plan: Extubation in OR  Informed Consent: I have reviewed the patients History and Physical, chart, labs and discussed the procedure including the risks, benefits and alternatives for the proposed anesthesia with the patient or authorized representative who has indicated his/her understanding and acceptance.   Dental advisory given  Plan Discussed with: CRNA and Surgeon  Anesthesia Plan Comments:         Anesthesia Quick Evaluation

## 2016-10-28 ENCOUNTER — Inpatient Hospital Stay (HOSPITAL_COMMUNITY): Payer: Medicare Other | Admitting: Anesthesiology

## 2016-10-28 ENCOUNTER — Inpatient Hospital Stay (HOSPITAL_COMMUNITY): Payer: Medicare Other

## 2016-10-28 ENCOUNTER — Inpatient Hospital Stay (HOSPITAL_COMMUNITY)
Admission: RE | Admit: 2016-10-28 | Discharge: 2016-10-29 | DRG: 460 | Disposition: A | Discharge status: Home / Self Care | Payer: Medicare Other | Source: Ambulatory Visit | Attending: Neurosurgery | Admitting: Neurosurgery

## 2016-10-28 ENCOUNTER — Encounter (HOSPITAL_COMMUNITY): Payer: Self-pay

## 2016-10-28 ENCOUNTER — Encounter (HOSPITAL_COMMUNITY)
Admission: RE | Disposition: A | Discharge status: Home / Self Care | Payer: Self-pay | Source: Ambulatory Visit | Attending: Neurosurgery

## 2016-10-28 DIAGNOSIS — Z87891 Personal history of nicotine dependence: Secondary | ICD-10-CM

## 2016-10-28 DIAGNOSIS — M545 Low back pain: Secondary | ICD-10-CM | POA: Diagnosis not present

## 2016-10-28 DIAGNOSIS — Y792 Prosthetic and other implants, materials and accessory orthopedic devices associated with adverse incidents: Secondary | ICD-10-CM | POA: Diagnosis not present

## 2016-10-28 DIAGNOSIS — G4733 Obstructive sleep apnea (adult) (pediatric): Secondary | ICD-10-CM | POA: Diagnosis not present

## 2016-10-28 DIAGNOSIS — T84226A Displacement of internal fixation device of vertebrae, initial encounter: Principal | ICD-10-CM | POA: Diagnosis present

## 2016-10-28 DIAGNOSIS — M1990 Unspecified osteoarthritis, unspecified site: Secondary | ICD-10-CM | POA: Diagnosis not present

## 2016-10-28 DIAGNOSIS — F418 Other specified anxiety disorders: Secondary | ICD-10-CM | POA: Diagnosis not present

## 2016-10-28 DIAGNOSIS — J45909 Unspecified asthma, uncomplicated: Secondary | ICD-10-CM | POA: Diagnosis not present

## 2016-10-28 DIAGNOSIS — K219 Gastro-esophageal reflux disease without esophagitis: Secondary | ICD-10-CM | POA: Diagnosis present

## 2016-10-28 DIAGNOSIS — Z79899 Other long term (current) drug therapy: Secondary | ICD-10-CM

## 2016-10-28 DIAGNOSIS — M4316 Spondylolisthesis, lumbar region: Secondary | ICD-10-CM | POA: Diagnosis not present

## 2016-10-28 DIAGNOSIS — M960 Pseudarthrosis after fusion or arthrodesis: Secondary | ICD-10-CM | POA: Diagnosis present

## 2016-10-28 DIAGNOSIS — Y838 Other surgical procedures as the cause of abnormal reaction of the patient, or of later complication, without mention of misadventure at the time of the procedure: Secondary | ICD-10-CM | POA: Diagnosis not present

## 2016-10-28 DIAGNOSIS — Z419 Encounter for procedure for purposes other than remedying health state, unspecified: Secondary | ICD-10-CM

## 2016-10-28 DIAGNOSIS — F609 Personality disorder, unspecified: Secondary | ICD-10-CM | POA: Diagnosis present

## 2016-10-28 DIAGNOSIS — F319 Bipolar disorder, unspecified: Secondary | ICD-10-CM | POA: Diagnosis not present

## 2016-10-28 DIAGNOSIS — M4856XA Collapsed vertebra, not elsewhere classified, lumbar region, initial encounter for fracture: Secondary | ICD-10-CM | POA: Diagnosis not present

## 2016-10-28 DIAGNOSIS — M4317 Spondylolisthesis, lumbosacral region: Secondary | ICD-10-CM | POA: Diagnosis not present

## 2016-10-28 DIAGNOSIS — F4310 Post-traumatic stress disorder, unspecified: Secondary | ICD-10-CM | POA: Diagnosis present

## 2016-10-28 DIAGNOSIS — T84296A Other mechanical complication of internal fixation device of vertebrae, initial encounter: Secondary | ICD-10-CM | POA: Diagnosis not present

## 2016-10-28 DIAGNOSIS — G9741 Accidental puncture or laceration of dura during a procedure: Secondary | ICD-10-CM | POA: Diagnosis not present

## 2016-10-28 DIAGNOSIS — S32009K Unspecified fracture of unspecified lumbar vertebra, subsequent encounter for fracture with nonunion: Secondary | ICD-10-CM | POA: Diagnosis present

## 2016-10-28 DIAGNOSIS — E785 Hyperlipidemia, unspecified: Secondary | ICD-10-CM | POA: Diagnosis not present

## 2016-10-28 HISTORY — DX: Unspecified fracture of unspecified lumbar vertebra, subsequent encounter for fracture with nonunion: S32.009K

## 2016-10-28 LAB — CBC
HCT: 31.2 % — ABNORMAL LOW (ref 36.0–46.0)
HEMOGLOBIN: 10.6 g/dL — AB (ref 12.0–15.0)
MCH: 30.9 pg (ref 26.0–34.0)
MCHC: 34 g/dL (ref 30.0–36.0)
MCV: 91 fL (ref 78.0–100.0)
PLATELETS: 536 10*3/uL — AB (ref 150–400)
RBC: 3.43 MIL/uL — ABNORMAL LOW (ref 3.87–5.11)
RDW: 15.2 % (ref 11.5–15.5)
WBC: 7.9 10*3/uL (ref 4.0–10.5)

## 2016-10-28 LAB — BASIC METABOLIC PANEL
Anion gap: 9 (ref 5–15)
BUN: 9 mg/dL (ref 6–20)
CALCIUM: 8.8 mg/dL — AB (ref 8.9–10.3)
CO2: 28 mmol/L (ref 22–32)
CREATININE: 0.8 mg/dL (ref 0.44–1.00)
Chloride: 99 mmol/L — ABNORMAL LOW (ref 101–111)
GFR calc Af Amer: >60 mL/min (ref >60)
GFR calc non Af Amer: >60 mL/min (ref >60)
GLUCOSE: 114 mg/dL — AB (ref 65–99)
Potassium: 4.1 mmol/L (ref 3.5–5.1)
Sodium: 136 mmol/L (ref 135–145)

## 2016-10-28 LAB — TYPE AND SCREEN
ABO/RH(D): O POS
Antibody Screen: NEGATIVE

## 2016-10-28 SURGERY — POSTERIOR LUMBAR FUSION 1 LEVEL
Anesthesia: General | Site: Back | Laterality: Left

## 2016-10-28 MED ORDER — MIDAZOLAM HCL 2 MG/2ML IJ SOLN
INTRAMUSCULAR | Status: AC
  Filled 2016-10-28: qty 2

## 2016-10-28 MED ORDER — PROPOFOL 10 MG/ML IV BOLUS
INTRAVENOUS | Status: AC
  Filled 2016-10-28: qty 20

## 2016-10-28 MED ORDER — 0.9 % SODIUM CHLORIDE (POUR BTL) OPTIME
TOPICAL | Status: DC | PRN
  Administered 2016-10-28: 1000 mL

## 2016-10-28 MED ORDER — HEMOSTATIC AGENTS (NO CHARGE) OPTIME
TOPICAL | Status: DC | PRN
  Administered 2016-10-28: 1 via TOPICAL

## 2016-10-28 MED ORDER — SODIUM CHLORIDE 0.9% FLUSH: 3.0000 mL | INTRAVENOUS | Status: DC | PRN

## 2016-10-28 MED ORDER — MEPERIDINE HCL 25 MG/ML IJ SOLN: 6.2500 mg | INTRAMUSCULAR | Status: DC | PRN

## 2016-10-28 MED ORDER — PHENYLEPHRINE 40 MCG/ML (10ML) SYRINGE FOR IV PUSH (FOR BLOOD PRESSURE SUPPORT)
PREFILLED_SYRINGE | INTRAVENOUS | Status: AC
  Filled 2016-10-28: qty 10

## 2016-10-28 MED ORDER — CHLORHEXIDINE GLUCONATE CLOTH 2 % EX PADS: 6.0000 | MEDICATED_PAD | Freq: Once | CUTANEOUS | Status: DC

## 2016-10-28 MED ORDER — PROPOFOL 10 MG/ML IV BOLUS
INTRAVENOUS | Status: DC | PRN
  Administered 2016-10-28: 150 mg via INTRAVENOUS

## 2016-10-28 MED ORDER — SCOPOLAMINE 1 MG/3DAYS TD PT72
MEDICATED_PATCH | TRANSDERMAL | Status: DC | PRN
  Administered 2016-10-28: 1 via TRANSDERMAL

## 2016-10-28 MED ORDER — FENTANYL CITRATE (PF) 100 MCG/2ML IJ SOLN
25.0000 ug | INTRAMUSCULAR | Status: DC | PRN
  Administered 2016-10-28: 50 ug via INTRAVENOUS

## 2016-10-28 MED ORDER — FENTANYL CITRATE (PF) 100 MCG/2ML IJ SOLN
INTRAMUSCULAR | Status: DC | PRN
  Administered 2016-10-28: 100 ug via INTRAVENOUS
  Administered 2016-10-28: 50 ug via INTRAVENOUS

## 2016-10-28 MED ORDER — DEXAMETHASONE SODIUM PHOSPHATE 10 MG/ML IJ SOLN
INTRAMUSCULAR | Status: AC
  Filled 2016-10-28: qty 1

## 2016-10-28 MED ORDER — SIMVASTATIN 20 MG PO TABS
20.0000 mg | ORAL_TABLET | Freq: Every day | ORAL | Status: DC
  Administered 2016-10-29: 20 mg via ORAL
  Filled 2016-10-28: qty 1

## 2016-10-28 MED ORDER — FENTANYL CITRATE (PF) 100 MCG/2ML IJ SOLN
INTRAMUSCULAR | Status: AC
  Filled 2016-10-28: qty 2

## 2016-10-28 MED ORDER — HYDROMORPHONE HCL 1 MG/ML IJ SOLN: 0.5000 mg | INTRAMUSCULAR | Status: DC | PRN

## 2016-10-28 MED ORDER — MENTHOL 3 MG MT LOZG: 1.0000 | LOZENGE | OROMUCOSAL | Status: DC | PRN

## 2016-10-28 MED ORDER — BUPIVACAINE HCL (PF) 0.5 % IJ SOLN
INTRAMUSCULAR | Status: AC
  Filled 2016-10-28: qty 30

## 2016-10-28 MED ORDER — ONDANSETRON HCL 4 MG/2ML IJ SOLN: 4.0000 mg | Freq: Once | INTRAMUSCULAR | Status: DC | PRN

## 2016-10-28 MED ORDER — ONDANSETRON HCL 4 MG/2ML IJ SOLN
INTRAMUSCULAR | Status: DC | PRN
  Administered 2016-10-28: 4 mg via INTRAVENOUS

## 2016-10-28 MED ORDER — VANCOMYCIN HCL 1000 MG IV SOLR
INTRAVENOUS | Status: DC | PRN
  Administered 2016-10-28: 1000 mg via TOPICAL

## 2016-10-28 MED ORDER — THROMBIN 20000 UNITS EX SOLR
CUTANEOUS | Status: AC
  Filled 2016-10-28: qty 20000

## 2016-10-28 MED ORDER — ONDANSETRON HCL 4 MG/2ML IJ SOLN
INTRAMUSCULAR | Status: AC
  Filled 2016-10-28: qty 2

## 2016-10-28 MED ORDER — FENTANYL CITRATE (PF) 250 MCG/5ML IJ SOLN
INTRAMUSCULAR | Status: AC
Start: 2016-10-28 — End: 2016-10-28
  Filled 2016-10-28: qty 5

## 2016-10-28 MED ORDER — SODIUM CHLORIDE 0.9 % IV SOLN: 250.0000 mL | INTRAVENOUS | Status: DC

## 2016-10-28 MED ORDER — HYDROXYZINE HCL 25 MG PO TABS: 50.0000 mg | ORAL_TABLET | Freq: Three times a day (TID) | ORAL | Status: DC | PRN

## 2016-10-28 MED ORDER — VANCOMYCIN HCL 1000 MG IV SOLR
INTRAVENOUS | Status: AC
  Filled 2016-10-28: qty 1000

## 2016-10-28 MED ORDER — BUPIVACAINE HCL (PF) 0.5 % IJ SOLN
INTRAMUSCULAR | Status: DC | PRN
  Administered 2016-10-28: 20 mL

## 2016-10-28 MED ORDER — LIDOCAINE 2% (20 MG/ML) 5 ML SYRINGE
INTRAMUSCULAR | Status: AC
  Filled 2016-10-28: qty 5

## 2016-10-28 MED ORDER — SODIUM CHLORIDE 0.9 % IR SOLN
Status: DC | PRN
  Administered 2016-10-28: 09:00:00

## 2016-10-28 MED ORDER — SODIUM CHLORIDE 0.9% FLUSH
3.0000 mL | Freq: Two times a day (BID) | INTRAVENOUS | Status: DC
  Administered 2016-10-28: 3 mL via INTRAVENOUS

## 2016-10-28 MED ORDER — TIZANIDINE HCL 4 MG PO TABS: 4.0000 mg | ORAL_TABLET | Freq: Three times a day (TID) | ORAL | Status: DC | PRN

## 2016-10-28 MED ORDER — ALBUTEROL SULFATE (2.5 MG/3ML) 0.083% IN NEBU: 3.0000 mL | INHALATION_SOLUTION | RESPIRATORY_TRACT | Status: DC | PRN

## 2016-10-28 MED ORDER — QUETIAPINE FUMARATE 50 MG PO TABS
50.0000 mg | ORAL_TABLET | Freq: Two times a day (BID) | ORAL | Status: DC | PRN
  Administered 2016-10-28: 50 mg via ORAL
  Filled 2016-10-28 (×2): qty 1

## 2016-10-28 MED ORDER — BUPROPION HCL ER (XL) 300 MG PO TB24
300.0000 mg | ORAL_TABLET | Freq: Every day | ORAL | Status: DC
  Administered 2016-10-29: 300 mg via ORAL
  Filled 2016-10-28: qty 1

## 2016-10-28 MED ORDER — HYDROCODONE-ACETAMINOPHEN 10-325 MG PO TABS
1.0000 | ORAL_TABLET | ORAL | Status: DC | PRN
  Administered 2016-10-28 – 2016-10-29 (×6): 2 via ORAL
  Filled 2016-10-28 (×6): qty 2

## 2016-10-28 MED ORDER — SUGAMMADEX SODIUM 200 MG/2ML IV SOLN
INTRAVENOUS | Status: DC | PRN
  Administered 2016-10-28: 100 mg via INTRAVENOUS

## 2016-10-28 MED ORDER — CEFAZOLIN SODIUM-DEXTROSE 1-4 GM/50ML-% IV SOLN
1.0000 g | Freq: Three times a day (TID) | INTRAVENOUS | Status: AC
  Administered 2016-10-28 (×2): 1 g via INTRAVENOUS
  Filled 2016-10-28 (×2): qty 50

## 2016-10-28 MED ORDER — DIAZEPAM 5 MG PO TABS
5.0000 mg | ORAL_TABLET | Freq: Four times a day (QID) | ORAL | Status: DC | PRN
  Administered 2016-10-28 – 2016-10-29 (×3): 5 mg via ORAL
  Filled 2016-10-28 (×2): qty 1
  Filled 2016-10-28: qty 2

## 2016-10-28 MED ORDER — PAROXETINE HCL 20 MG PO TABS
40.0000 mg | ORAL_TABLET | ORAL | Status: DC
  Administered 2016-10-29: 40 mg via ORAL
  Filled 2016-10-28: qty 2

## 2016-10-28 MED ORDER — ONDANSETRON HCL 4 MG/2ML IJ SOLN: 4.0000 mg | Freq: Four times a day (QID) | INTRAMUSCULAR | Status: DC | PRN

## 2016-10-28 MED ORDER — GABAPENTIN 600 MG PO TABS
300.0000 mg | ORAL_TABLET | Freq: Three times a day (TID) | ORAL | Status: DC
Start: 2016-10-28 — End: 2016-10-29
  Administered 2016-10-28 – 2016-10-29 (×4): 300 mg via ORAL
  Filled 2016-10-28 (×4): qty 1

## 2016-10-28 MED ORDER — THROMBIN 20000 UNITS EX SOLR
CUTANEOUS | Status: DC | PRN
  Administered 2016-10-28: 09:00:00 via TOPICAL

## 2016-10-28 MED ORDER — SUGAMMADEX SODIUM 200 MG/2ML IV SOLN
INTRAVENOUS | Status: AC
  Filled 2016-10-28: qty 2

## 2016-10-28 MED ORDER — SCOPOLAMINE 1 MG/3DAYS TD PT72
MEDICATED_PATCH | TRANSDERMAL | Status: AC
  Filled 2016-10-28: qty 1

## 2016-10-28 MED ORDER — MIDAZOLAM HCL 5 MG/5ML IJ SOLN
INTRAMUSCULAR | Status: DC | PRN
  Administered 2016-10-28: 2 mg via INTRAVENOUS

## 2016-10-28 MED ORDER — PHENYLEPHRINE HCL 10 MG/ML IJ SOLN
INTRAMUSCULAR | Status: DC | PRN
  Administered 2016-10-28: 80 ug via INTRAVENOUS
  Administered 2016-10-28 (×3): 40 ug via INTRAVENOUS

## 2016-10-28 MED ORDER — LIDOCAINE HCL (CARDIAC) 20 MG/ML IV SOLN
INTRAVENOUS | Status: DC | PRN
  Administered 2016-10-28: 60 mg via INTRAVENOUS
  Administered 2016-10-28: 40 mg via INTRAVENOUS

## 2016-10-28 MED ORDER — ONDANSETRON HCL 4 MG PO TABS: 4.0000 mg | ORAL_TABLET | Freq: Four times a day (QID) | ORAL | Status: DC | PRN

## 2016-10-28 MED ORDER — SODIUM CHLORIDE 0.9 % IV SOLN
INTRAVENOUS | Status: DC | PRN
  Administered 2016-10-28: 10 ug/min via INTRAVENOUS

## 2016-10-28 MED ORDER — PHENOL 1.4 % MT LIQD: 1.0000 | OROMUCOSAL | Status: DC | PRN

## 2016-10-28 MED ORDER — ROCURONIUM BROMIDE 10 MG/ML (PF) SYRINGE
PREFILLED_SYRINGE | INTRAVENOUS | Status: AC
  Filled 2016-10-28: qty 5

## 2016-10-28 MED ORDER — ROCURONIUM BROMIDE 100 MG/10ML IV SOLN
INTRAVENOUS | Status: DC | PRN
  Administered 2016-10-28 (×2): 10 mg via INTRAVENOUS
  Administered 2016-10-28: 40 mg via INTRAVENOUS

## 2016-10-28 MED ORDER — LACTATED RINGERS IV SOLN
INTRAVENOUS | Status: DC
  Administered 2016-10-28: 07:00:00 via INTRAVENOUS

## 2016-10-28 SURGICAL SUPPLY — 65 items
BAG DECANTER FOR FLEXI CONT (MISCELLANEOUS) ×2 IMPLANT
BENZOIN TINCTURE PRP APPL 2/3 (GAUZE/BANDAGES/DRESSINGS) ×2 IMPLANT
BLADE CLIPPER SURG (BLADE) IMPLANT
BUR CUTTER 7.0 ROUND (BURR) IMPLANT
BUR MATCHSTICK NEURO 3.0 LAGG (BURR) ×2 IMPLANT
CANISTER SUCT 3000ML PPV (MISCELLANEOUS) ×2 IMPLANT
CAP LCK SPNE (Orthopedic Implant) ×2 IMPLANT
CAP LOCK SPINE RADIUS (Orthopedic Implant) ×2 IMPLANT
CAP LOCKING (Orthopedic Implant) ×2 IMPLANT
CARTRIDGE OIL MAESTRO DRILL (MISCELLANEOUS) ×1 IMPLANT
CONT SPEC 4OZ CLIKSEAL STRL BL (MISCELLANEOUS) ×2 IMPLANT
COVER BACK TABLE 60X90IN (DRAPES) ×2 IMPLANT
DECANTER SPIKE VIAL GLASS SM (MISCELLANEOUS) ×2 IMPLANT
DERMABOND ADVANCED (GAUZE/BANDAGES/DRESSINGS) ×1
DERMABOND ADVANCED .7 DNX12 (GAUZE/BANDAGES/DRESSINGS) ×1 IMPLANT
DIFFUSER DRILL AIR PNEUMATIC (MISCELLANEOUS) ×2 IMPLANT
DRAPE C-ARM 42X72 X-RAY (DRAPES) ×4 IMPLANT
DRAPE HALF SHEET 40X57 (DRAPES) IMPLANT
DRAPE LAPAROTOMY 100X72X124 (DRAPES) ×2 IMPLANT
DRAPE POUCH INSTRU U-SHP 10X18 (DRAPES) ×2 IMPLANT
DRAPE SURG 17X23 STRL (DRAPES) ×8 IMPLANT
DRSG OPSITE POSTOP 4X6 (GAUZE/BANDAGES/DRESSINGS) ×2 IMPLANT
DURAPREP 26ML APPLICATOR (WOUND CARE) ×2 IMPLANT
DURASEAL APPLICATOR TIP (TIP) ×2 IMPLANT
DURASEAL SPINE SEALANT 3ML (MISCELLANEOUS) ×4 IMPLANT
ELECT REM PT RETURN 9FT ADLT (ELECTROSURGICAL) ×2
ELECTRODE REM PT RTRN 9FT ADLT (ELECTROSURGICAL) ×1 IMPLANT
EVACUATOR 1/8 PVC DRAIN (DRAIN) IMPLANT
GAUZE SPONGE 4X4 12PLY STRL (GAUZE/BANDAGES/DRESSINGS) IMPLANT
GAUZE SPONGE 4X4 16PLY XRAY LF (GAUZE/BANDAGES/DRESSINGS) IMPLANT
GLOVE BIOGEL PI IND STRL 7.5 (GLOVE) ×1 IMPLANT
GLOVE BIOGEL PI INDICATOR 7.5 (GLOVE) ×1
GLOVE ECLIPSE 9.0 STRL (GLOVE) ×4 IMPLANT
GLOVE EXAM NITRILE LRG STRL (GLOVE) IMPLANT
GLOVE EXAM NITRILE XL STR (GLOVE) IMPLANT
GLOVE EXAM NITRILE XS STR PU (GLOVE) IMPLANT
GLOVE SS BIOGEL STRL SZ 7.5 (GLOVE) ×1 IMPLANT
GLOVE SUPERSENSE BIOGEL SZ 7.5 (GLOVE) ×1
GOWN STRL REUS W/ TWL LRG LVL3 (GOWN DISPOSABLE) IMPLANT
GOWN STRL REUS W/ TWL XL LVL3 (GOWN DISPOSABLE) ×2 IMPLANT
GOWN STRL REUS W/TWL 2XL LVL3 (GOWN DISPOSABLE) IMPLANT
GOWN STRL REUS W/TWL LRG LVL3 (GOWN DISPOSABLE)
GOWN STRL REUS W/TWL XL LVL3 (GOWN DISPOSABLE) ×2
GRAFT BONE MAGNIFUSE 1X5 SNGL (Bone Implant) ×2 IMPLANT
KIT BASIN OR (CUSTOM PROCEDURE TRAY) ×2 IMPLANT
KIT ROOM TURNOVER OR (KITS) ×2 IMPLANT
MILL MEDIUM DISP (BLADE) ×2 IMPLANT
NEEDLE HYPO 22GX1.5 SAFETY (NEEDLE) ×2 IMPLANT
NS IRRIG 1000ML POUR BTL (IV SOLUTION) ×2 IMPLANT
OIL CARTRIDGE MAESTRO DRILL (MISCELLANEOUS) ×2
PACK LAMINECTOMY NEURO (CUSTOM PROCEDURE TRAY) ×2 IMPLANT
ROD MAX RAD 100MM SPINE (Rod) ×2 IMPLANT
SCREW 6.75X40MM (Screw) ×2 IMPLANT
SCREW SPINAL CANN 7.75X30MM (Screw) ×2 IMPLANT
SPONGE SURGIFOAM ABS GEL 100 (HEMOSTASIS) ×2 IMPLANT
STRIP CLOSURE SKIN 1/2X4 (GAUZE/BANDAGES/DRESSINGS) ×2 IMPLANT
SUT PROLENE 6 0 BV (SUTURE) ×2 IMPLANT
SUT VIC AB 0 CT1 18XCR BRD8 (SUTURE) ×1 IMPLANT
SUT VIC AB 0 CT1 8-18 (SUTURE) ×1
SUT VIC AB 2-0 CT1 18 (SUTURE) ×2 IMPLANT
SUT VIC AB 3-0 SH 8-18 (SUTURE) ×4 IMPLANT
TOWEL GREEN STERILE (TOWEL DISPOSABLE) ×2 IMPLANT
TOWEL GREEN STERILE FF (TOWEL DISPOSABLE) ×2 IMPLANT
TRAY FOLEY W/METER SILVER 16FR (SET/KITS/TRAYS/PACK) ×2 IMPLANT
WATER STERILE IRR 1000ML POUR (IV SOLUTION) ×2 IMPLANT

## 2016-10-28 NOTE — Op Note (Signed)
Date of procedure: 10/28/2016   Date of dictation: Same  Service: Neurosurgery  Preoperative diagnosis: L4-L5 hardware failure with dislodged cage and likely pseudoarthrosis  Postoperative diagnosis: Same  Procedure Name: Reexploration of L4 L5 S1 posterior lumbar fusion. Removal of left L4-L5 dislodged cage and left L5 pedicle screw  Revision L4-S1 posterior lateral arthrodesis utilizing nonsegmental pedicle screw fixation  Surgeon:Misha Antonini A.Camaryn Lumbert, M.D.  Asst. Surgeon: Ditty   Anesthesia: General  Indication: 51 year old female recently status post L4-S1 decompression and fusion. Patient with severe left lower extremity radicular pain. Workup demonstrates evidence of lateral dislodgment of her left L4-5 cage with likely left L4 nerve root compression. Patient presents now for removal of cage and likely revision of fusion.  Operative noteafter induction anesthesia, patient position prone onto Wilson frame and appropriately padded. Lumbar region prepped and draped sterilely. Incision made from L4-S1. Retractor placed. Previously placed pedicle screw station was dissected free bilaterally. It was not grossly loosened or dislodged. The pedicle screw hardware on the left at L4-5 was dissected free further. There was an obvious fracture of the superior aspect of the L5 pedicle on the left with some superiorly displaced bone causing compression the left L4 nerve root. The pedicle screw construct was disassembled. The rod was removed. The pedicle screw at L5 on the left was removed. The screw on the left at S1 was loosened. The screw on the left at L4 was intact. I dissected the cage free. This was then collapsed and then removed with a rongeur. There was no evidence of injury to thecal sac or left L4 nerve root. The pedicle screw hardware on the patient's right side was inspected. This is found to be well-positioned and intact without any evidence of loosening. I decided not to replace the cage as I  thought it would be possible at would kick out laterally again. The cages on the right at L4-5 and bilaterally at L5-S1 appeared intact it into position. I replaced a screw into the S1 segment directing it more medially and capturing the sacral promontory. A short segment of titanium rods and placed or the screw heads at L4 and S1 on the left. Locking caps were then engaged. In the disc space on the left-sided L4-5 I packed in a 5 mL magna diffuse packet for some additional graft material to hopefully propagate fusion. In the process of putting in the left-sided S1 screw the screw tap inadvertently tore the central dura. I repaired this with a single 6-0 suture. DuraSeal was then placed over the dural repair. Vancomycin powder was placed the deep wound space. Wounds and close in layers with Vicryl sutures. Steri-Strips and sterile dressing were applied. Patient tolerated the procedure well and she returns to the recovery room postop.

## 2016-10-28 NOTE — H&P (Signed)
[name] is an 51 y.o. female.   Chief Complaint: Left leg pain HPI: 51 year old female proximal and 2 weeks status post 2 level lumbar decompression and fusion. Postoperatively patient has suffered with intractable leg pain. X-rays demonstrate lateral dislodgment of her interbody cage at L4-5 with likely compression of her left L4 nerve root. Patient presents now for reexploration of her lumbar fusion with removal/replacement of the cage in hopes of improving her symptoms.  Past Medical History:  Diagnosis Date  . Anxiety   . Arthritis   . Asthma   . Bipolar disorder ([place])    currently feeling MANIC- 10/02/2016  . Depression   . GERD (gastroesophageal reflux disease)   . History of blood transfusion    as a newborn   . History of lump of left breast   . Hyperlipidemia   . Lumbar pseudoarthrosis   . OSA (obstructive sleep apnea) 01/09/2016   can't afford CPAP  . Personality disorder   . PONV (postoperative nausea and vomiting)   . Post traumatic stress disorder (PTSD)   . Substance abuse   . Synovial cyst     Past Surgical History:  Procedure Laterality Date  . KNEE SURGERY Left    x5, post basketball injury  . LUMBAR LAMINECTOMY/DECOMPRESSION MICRODISCECTOMY Left 01/22/2016   Procedure: Laminectomy for facet/synovial cyst - left - Lumbar four - lumbar five;  Surgeon: Earnie Larsson, MD;  Location: [facility];  Service: Neurosurgery;  Laterality: Left;  Laminectomy for facet/synovial cyst - left - Lumbar four - lumbar five  . SHOULDER SURGERY Left    x2  . TOE SURGERY Bilateral    bone spurs    Family History  Problem Relation Age of Onset  . Heart disease Father   . Hyperlipidemia Father   . Alcohol abuse Brother   . Alcohol abuse Paternal Uncle   . Colon cancer Neg Hx    Social History:  reports that she has quit smoking. Her smoking use included Cigarettes. She has a 1.00 pack-year smoking history. She has never used smokeless tobacco. She reports that she uses drugs,  including Marijuana, about 14 times per week. She reports that she does not drink alcohol.  Allergies:  Allergies  Allergen Reactions  . Effexor [Venlafaxine] Other (See Comments)    UNSPECIFIED REACTION   . Lamictal [Lamotrigine] Rash    Medications Prior to Admission  Medication Sig Dispense Refill  . albuterol (PROVENTIL HFA;VENTOLIN HFA) 108 (90 Base) MCG/ACT inhaler Inhale 2 puffs into the lungs every 4 (four) hours as needed for wheezing or shortness of breath. 1 Inhaler 1  . buPROPion (WELLBUTRIN XL) 300 MG 24 hr tablet Take 1 tablet (300 mg total) by mouth daily. (Patient taking differently: Take 300 mg by mouth daily after breakfast. ) 90 tablet 1  . diazepam (VALIUM) 5 MG tablet Take 1-2 tablets (5-10 mg total) by mouth every 6 (six) hours as needed for muscle spasms. 50 tablet 0  . gabapentin (NEURONTIN) 600 MG tablet Take 0.5 tablets (300 mg total) by mouth 3 (three) times daily. 90 tablet 0  . HYDROcodone-acetaminophen (NORCO) 10-325 MG tablet Take 1-2 tablets by mouth every 4 (four) hours as needed for moderate pain. (Patient taking differently: Take 1 tablet by mouth every 6 (six) hours as needed for moderate pain. ) 60 tablet 0  . hydrOXYzine (ATARAX/VISTARIL) 50 MG tablet Take 50 mg by mouth every 8 (eight) hours as needed for anxiety.     Marland Kitchen PARoxetine (PAXIL) 40 MG  tablet Take 40 mg by mouth every morning.     Marland Kitchen QUEtiapine (SEROQUEL) 50 MG tablet Take 50 mg by mouth 2 (two) times daily as needed (for agitation/anxiety).     . simvastatin (ZOCOR) 20 MG tablet Take 1 tablet (20 mg total) by mouth daily. MUST SCHEDULE ANNUAL PHYSICAL 90 tablet 0  . tiZANidine (ZANAFLEX) 4 MG tablet Take 4 mg by mouth every 8 (eight) hours as needed (for pain).     Marland Kitchen scopolamine (TRANSDERM-SCOP, 1.5 MG,) 1 MG/3DAYS Place 1 patch (1.5 mg total) onto the skin every 3 (three) days. (Patient not taking: Reported on 09/25/2016) 5 patch 0    Results for orders placed or performed during the hospital  encounter of 10/28/16 (from the past 48 hour(s))  Basic metabolic panel     Status: Abnormal   Collection Time: 10/28/16  6:59 AM  Result Value Ref Range   Sodium 136 135 - 145 mmol/L   Potassium 4.1 3.5 - 5.1 mmol/L   Chloride 99 (L) 101 - 111 mmol/L   CO2 28 22 - 32 mmol/L   Glucose, Bld 114 (H) 65 - 99 mg/dL   BUN 9 6 - 20 mg/dL   Creatinine, Ser 0.80 0.44 - 1.00 mg/dL   Calcium 8.8 (L) 8.9 - 10.3 mg/dL   GFR calc non Af Amer >60 >60 mL/min   GFR calc Af Amer >60 >60 mL/min    Comment: (NOTE) The eGFR has been calculated using the CKD EPI equation. This calculation has not been validated in all clinical situations. eGFR's persistently <60 mL/min signify possible Chronic Kidney Disease.    Anion gap 9 5 - 15  CBC     Status: Abnormal   Collection Time: 10/28/16  6:59 AM  Result Value Ref Range   WBC 7.9 4.0 - 10.5 K/uL   RBC 3.43 (L) 3.87 - 5.11 MIL/uL   Hemoglobin 10.6 (L) 12.0 - 15.0 g/dL   HCT 31.2 (L) 36.0 - 46.0 %   MCV 91.0 78.0 - 100.0 fL   MCH 30.9 26.0 - 34.0 pg   MCHC 34.0 30.0 - 36.0 g/dL   RDW 15.2 11.5 - 15.5 %   Platelets 536 (H) 150 - 400 K/uL   No results found.  Pertinent items noted in HPI and remainder of comprehensive ROS otherwise negative.  Blood pressure 129/76, pulse 95, temperature 97.9 F (36.6 C), temperature source Oral, resp. rate 16, weight 51.3 kg (113 lb), SpO2 97 %.  Patient is awake and alert. She is oriented and appropriate. Speech is fluent. Judgment and insight are intact. She is absent comparison A history head ears eyes and thirds unremarkable chest and abdomen are benign. Extremities are free from injury deformity. Wound is healing well. Straight raising positive on the left. Negative on the right. Left quadriceps 4/5 limited by pain. Sensory examination decreased sensation over light touch her left L4 dermatome. Deep tendon of his normal active except left patellar reflex diminished. No evidence of long track signs. Gait and  posture Assessment/Plan Dislodgment left L4-5 interbody cage. Plan reexploration of fusion with removal/replacement of cage. Risks and benefits been explained. Patient wishes to proceed.  Remmi Armenteros A 10/28/2016, 7:45 AM

## 2016-10-28 NOTE — Anesthesia Procedure Notes (Addendum)
Procedure Name: Intubation Date/Time: 10/28/2016 8:02 AM Performed by: Rush Farmer E Pre-anesthesia Checklist: Patient identified, Emergency Drugs available, Suction available and Patient being monitored Patient Re-evaluated:Patient Re-evaluated prior to induction Oxygen Delivery Method: Circle System Utilized Preoxygenation: Pre-oxygenation with 100% oxygen Induction Type: IV induction and Cricoid Pressure applied Ventilation: Mask ventilation without difficulty Laryngoscope Size: Mac and 4 Grade View: Grade I Tube type: Oral Tube size: 7.0 mm Number of attempts: 1 Airway Equipment and Method: Stylet and Oral airway Placement Confirmation: ETT inserted through vocal cords under direct vision,  positive ETCO2 and breath sounds checked- equal and bilateral Secured at: 22 cm Tube secured with: Tape Dental Injury: Teeth and Oropharynx as per pre-operative assessment  Comments: AOI per Hilda Blades, SRNA with Dr. Ambrose Pancoast supervising.

## 2016-10-28 NOTE — Brief Op Note (Signed)
10/28/2016  9:36 AM  PATIENT:  [name]  51 y.o. female  PRE-OPERATIVE DIAGNOSIS:  Spondylolisthesis  POST-OPERATIVE DIAGNOSIS:  Spondylolisthesis  PROCEDURE:  Procedure(s): Revision Lumbar four- five fusion (Left)  SURGEON:  Surgeon(s) and Role:    * Earnie Larsson, MD - Primary    * Ditty, Kevan Ny, MD - Assisting  PHYSICIAN ASSISTANT:   ASSISTANTS:    ANESTHESIA:   general  EBL:  Total I/O In: 700 [I.V.:700] Out: 500 [Urine:300; Blood:200]  BLOOD ADMINISTERED:none  DRAINS: none   LOCAL MEDICATIONS USED:  MARCAINE     SPECIMEN:  No Specimen  DISPOSITION OF SPECIMEN:  N/A  COUNTS:  YES  TOURNIQUET:  * No tourniquets in log *  DICTATION: .Dragon Dictation  PLAN OF CARE: Admit to inpatient   PATIENT DISPOSITION:  PACU - hemodynamically stable.   Delay start of Pharmacological VTE agent (>24hrs) due to surgical blood loss or risk of bleeding: yes

## 2016-10-28 NOTE — Transfer of Care (Signed)
Immediate Anesthesia Transfer of Care Note  Patient: [name]  Procedure(s) Performed: Procedure(s): Revision Lumbar four- five fusion (Left)  Patient Location: PACU  Anesthesia Type:General  Level of Consciousness: awake, alert  and oriented  Airway & Oxygen Therapy: Patient Spontanous Breathing and Patient connected to nasal cannula oxygen  Post-op Assessment: Report given to RN, Post -op Vital signs reviewed and stable and Patient moving all extremities X 4  Post vital signs: Reviewed and stable  Last Vitals:  Vitals:   10/28/16 0652 10/28/16 0945  BP:  (!) 150/79  Pulse:  85  Resp: 16 18  Temp:  36.5 C  SpO2: 97% 98%    Last Pain:  Vitals:   10/28/16 0703  TempSrc:   PainSc: 8          Complications: No apparent anesthesia complications

## 2016-10-28 NOTE — Anesthesia Postprocedure Evaluation (Signed)
Anesthesia Post Note  Patient: [name]  Procedure(s) Performed: Procedure(s) (LRB): Revision Lumbar four- five fusion (Left)     Patient location during evaluation: PACU Anesthesia Type: General Level of consciousness: awake and alert Pain management: pain level controlled Vital Signs Assessment: post-procedure vital signs reviewed and stable Respiratory status: spontaneous breathing, nonlabored ventilation, respiratory function stable and patient connected to nasal cannula oxygen Cardiovascular status: blood pressure returned to baseline and stable Postop Assessment: no signs of nausea or vomiting Anesthetic complications: no    Last Vitals:  Vitals:   10/28/16 0945 10/28/16 1000  BP: (!) 150/79 (!) 150/77  Pulse: 85 82  Resp: 18 20  Temp: 36.5 C   SpO2: 98% 98%    Last Pain:  Vitals:   10/28/16 0945  TempSrc:   PainSc: 0-No pain                 Loreda Silverio

## 2016-10-28 NOTE — Evaluation (Signed)
Physical Therapy Evaluation Patient Details Name: [name] MRN: [id number] DOB: [birth year] Today's Date: 10/28/2016   History of Present Illness  Pt is a 51 y/o female s/p revision of L4-5 PLIF. Pt with previous admission in August for L4-S1 PLIF. PMH includes anxiety, asthma, PTSD, bipolar disorder, OSA back surgery, L knee surgery, and L shoulder surgery.   Clinical Impression  Patient is s/p above surgery resulting in the deficits listed below (see PT Problem List). PTA, pt was using standard walker for mobility secondary to deficits from previous surgery. Upon eval, pt limited by pan and weakness. REquired supervision to min guard for mobility this session. Reports she will be staying with her parents at their house at d/c. Will need RW upon d/c. Patient will benefit from skilled PT to increase their independence and safety with mobility (while adhering to their precautions) to allow discharge to the venue listed below.     Follow Up Recommendations No PT follow up;Supervision for mobility/OOB    Equipment Recommendations  Rolling walker with 5" wheels    Recommendations for Other Services       Precautions / Restrictions Precautions Precautions: Back Precaution Booklet Issued: Yes (comment) Precaution Comments: Pt able to recall 2/3 back precautions. Reviewed handout with pt.  Required Braces or Orthoses: Spinal Brace Spinal Brace: Lumbar corset;Applied in sitting position Restrictions Weight Bearing Restrictions: No      Mobility  Bed Mobility Overal bed mobility: Needs Assistance Bed Mobility: Rolling;Sidelying to Sit;Sit to Sidelying Rolling: Supervision Sidelying to sit: Supervision     Sit to sidelying: Supervision General bed mobility comments: Supervision to ensure appropriate log roll technique. Required cues for use of log roll technique   Transfers Overall transfer level: Needs assistance Equipment used: Rolling walker (2 wheeled) Transfers: Sit  to/from Stand Sit to Stand: Min guard         General transfer comment: Min guard for safety   Ambulation/Gait Ambulation/Gait assistance: Min guard Ambulation Distance (Feet): 125 Feet Assistive device: Rolling walker (2 wheeled) Gait Pattern/deviations: Step-through pattern;Decreased stride length;Antalgic;Decreased weight shift to left;Trunk flexed Gait velocity: Decreased Gait velocity interpretation: Below normal speed for age/gender General Gait Details: Slow, guarded gait. Noted slight knee buckling in LLE secondary to weakness at basline. Verbal cues for upright posture and proximity to device   Stairs            Wheelchair Mobility    Modified Rankin (Stroke Patients Only)       Balance Overall balance assessment: Needs assistance Sitting-balance support: No upper extremity supported;Feet supported Sitting balance-Leahy Scale: Good     Standing balance support: Bilateral upper extremity supported;No upper extremity supported;During functional activity Standing balance-Leahy Scale: Fair Standing balance comment: able to maintain static standing wiithout UE support                              Pertinent Vitals/Pain Pain Assessment: 0-10 Pain Score: 7  Pain Location: back and LLE  Pain Descriptors / Indicators: Aching;Constant;Discomfort Pain Intervention(s): Limited activity within patient's tolerance;Monitored during session;Repositioned    Home Living Family/patient expects to be discharged to:: Private residence Living Arrangements: Parent Available Help at Discharge: Family;Available 24 hours/day Type of Home: House Home Access: Stairs to enter   [employer] of Steps: 1 (threshold ) Home Layout: One level (Threshold step to kitchen ) Home Equipment: Walker - standard;Cane - single point;Shower seat - built in;Toilet riser      Prior  Function Level of Independence: Independent with assistive device(s)          Comments: USed standard walker since last surgery.      Hand Dominance   Dominant Hand: Right    Extremity/Trunk Assessment   Upper Extremity Assessment Upper Extremity Assessment: Defer to OT evaluation    Lower Extremity Assessment Lower Extremity Assessment: LLE deficits/detail LLE Deficits / Details: Functional weakness noted, with slight knee buckling. Pt reports decreased pain from baseline     Cervical / Trunk Assessment Cervical / Trunk Assessment: Other exceptions Cervical / Trunk Exceptions: s/p surgery   Communication   Communication: No difficulties  Cognition Arousal/Alertness: Suspect due to medications (very sleepy ) Behavior During Therapy: WFL for tasks assessed/performed Overall Cognitive Status: Within Functional Limits for tasks assessed                                 General Comments: Pt very sleepy and required cues to keep eyes open       General Comments General comments (skin integrity, edema, etc.): Reviewed generalized walking program to perform at home. Pt reports she has been compliant with program since last admission.     Exercises     Assessment/Plan    PT Assessment Patient needs continued PT services  PT Problem List Decreased strength;Decreased activity tolerance;Decreased balance;Decreased mobility;Decreased knowledge of use of DME;Pain;Decreased knowledge of precautions       PT Treatment Interventions DME instruction;Stair training;Gait training;Functional mobility training;Therapeutic activities;Therapeutic exercise;Balance training;Neuromuscular re-education;Patient/family education    PT Goals (Current goals can be found in the Care Plan section)  Acute Rehab PT Goals Patient Stated Goal: to feel better  PT Goal Formulation: With patient Time For Goal Achievement: 11/04/16 Potential to Achieve Goals: Good    Frequency Min 5X/week   Barriers to discharge        Co-evaluation                AM-PAC PT "6 Clicks" Daily Activity  Outcome Measure Difficulty turning over in bed (including adjusting bedclothes, sheets and blankets)?: None Difficulty moving from lying on back to sitting on the side of the bed? : None Difficulty sitting down on and standing up from a chair with arms (e.g., wheelchair, bedside commode, etc,.)?: Unable Help needed moving to and from a bed to chair (including a wheelchair)?: A Little Help needed walking in hospital room?: A Little Help needed climbing 3-5 steps with a railing? : A Little 6 Click Score: 18    End of Session Equipment Utilized During Treatment: Back brace;Gait belt Activity Tolerance: Patient tolerated treatment well Patient left: in bed;with call bell/phone within reach;with family/visitor present Nurse Communication: Mobility status PT Visit Diagnosis: Other abnormalities of gait and mobility (R26.89);Pain;Difficulty in walking, not elsewhere classified (R26.2) Pain - Right/Left: Left Pain - part of body: Leg (back )    Time: 1610-9604 PT Time Calculation (min) (ACUTE ONLY): 25 min   Charges:   PT Evaluation $PT Eval Low Complexity: 1 Low PT Treatments $Gait Training: 8-22 mins   PT G Codes:        Leighton Ruff, PT, DPT  Acute Rehabilitation Services  Pager: [phone]   Rudean Hitt 10/28/2016, 2:08 PM

## 2016-10-29 MED ORDER — HYDROCODONE-ACETAMINOPHEN 10-325 MG PO TABS: 1.0000 | ORAL_TABLET | ORAL | 0 refills | Status: DC | PRN

## 2016-10-29 MED ORDER — DIAZEPAM 5 MG PO TABS: 5.0000 mg | ORAL_TABLET | Freq: Four times a day (QID) | ORAL | 0 refills | Status: DC | PRN

## 2016-10-29 NOTE — Discharge Summary (Signed)
Physician Discharge Summary  Patient ID: Danaisha Celli MRN: [id number] DOB/AGE: 03-11-1965 51 y.o.  Admit date: 10/28/2016 Discharge date: 10/29/2016  Admission Diagnoses:  Discharge Diagnoses:  Active Problems:   Closed pseudoarthrosis of lumbar spine [facility])   Discharged Condition: good  Hospital Course: Patient admitted to the hospital where she underwent uncomplicated revision of lumbar fusion. Posterior doing well. Preoperative severe left lower cavity pain resolved. Standing and ambulating with minimal difficulty. Back pain well-controlled.  Consults:   Significant Diagnostic Studies:   Treatments:   Discharge Exam: Blood pressure (!) 144/84, pulse 74, temperature (!) 97.5 F (36.4 C), temperature source Oral, resp. rate 18, weight 51.3 kg (113 lb), SpO2 99 %. Awake and alert. Oriented and appropriate. Cranial nerve function intact. Motor sensory function extremities normal. Wound clean and dry. Chest and abdomen benign.  Disposition: 01-Home or Self Care   Allergies as of 10/29/2016      Reactions   Effexor [venlafaxine] Other (See Comments)   UNSPECIFIED REACTION    Lamictal [lamotrigine] Rash      Medication List    TAKE these medications   albuterol 108 (90 Base) MCG/ACT inhaler Commonly known as:  PROVENTIL HFA;VENTOLIN HFA Inhale 2 puffs into the lungs every 4 (four) hours as needed for wheezing or shortness of breath.   buPROPion 300 MG 24 hr tablet Commonly known as:  WELLBUTRIN XL Take 1 tablet (300 mg total) by mouth daily. What changed:  when to take this   diazepam 5 MG tablet Commonly known as:  VALIUM Take 1-2 tablets (5-10 mg total) by mouth every 6 (six) hours as needed for muscle spasms.   gabapentin 600 MG tablet Commonly known as:  NEURONTIN Take 0.5 tablets (300 mg total) by mouth 3 (three) times daily.   HYDROcodone-acetaminophen 10-325 MG tablet Commonly known as:  NORCO Take 1-2 tablets by mouth every 4 (four) hours as needed for  moderate pain. What changed:  how much to take  when to take this   hydrOXYzine 50 MG tablet Commonly known as:  ATARAX/VISTARIL Take 50 mg by mouth every 8 (eight) hours as needed for anxiety.   PARoxetine 40 MG tablet Commonly known as:  PAXIL Take 40 mg by mouth every morning.   QUEtiapine 50 MG tablet Commonly known as:  SEROQUEL Take 50 mg by mouth 2 (two) times daily as needed (for agitation/anxiety).   scopolamine 1 MG/3DAYS Commonly known as:  TRANSDERM-SCOP (1.5 MG) Place 1 patch (1.5 mg total) onto the skin every 3 (three) days.   simvastatin 20 MG tablet Commonly known as:  ZOCOR Take 1 tablet (20 mg total) by mouth daily. MUST SCHEDULE ANNUAL PHYSICAL   tiZANidine 4 MG tablet Commonly known as:  ZANAFLEX Take 4 mg by mouth every 8 (eight) hours as needed (for pain).            Durable Medical Equipment        Start     Ordered   10/28/16 1502  For home use only DME Walker rolling  Once    Question:  Patient needs a walker to treat with the following condition  Answer:  Status post spinal surgery   10/28/16 1502   10/28/16 1052  DME Walker rolling  Once    Question:  Patient needs a walker to treat with the following condition  Answer:  Closed pseudoarthrosis of lumbar spine ([place])   10/28/16 1051   10/28/16 1052  DME 3 n 1  Once  10/28/16 1051       Discharge Care Instructions        Start     Ordered   10/29/16 0000  diazepam (VALIUM) 5 MG tablet  Every 6 hours PRN     10/29/16 0959   10/29/16 0000  HYDROcodone-acetaminophen (NORCO) 10-325 MG tablet  Every 4 hours PRN     10/29/16 0959       Signed: Danashia Landers A 10/29/2016, 9:59 AM

## 2016-10-29 NOTE — Discharge Instructions (Signed)

## 2016-10-29 NOTE — Progress Notes (Signed)
Physical Therapy Treatment Patient Details Name: [name] MRN: [id number] DOB: 1965/10/14 Today's Date: 10/29/2016    History of Present Illness Pt is a 51 y/o female s/p revision of L4-5 PLIF. Pt with previous admission in August for L4-S1 PLIF. PMH includes anxiety, asthma, PTSD, bipolar disorder, OSA back surgery, L knee surgery, and L shoulder surgery.     PT Comments    Patient making excellent progress toward achieving her current functional mobility goals. Pt recalled 2/3 back precautions, PT reviewed all three. Pt demonstrated good log roll technique for bed mobility independently. PT will continue to follow acutely to ensure a safe d/c home.     Follow Up Recommendations  No PT follow up;Supervision for mobility/OOB     Equipment Recommendations  Rolling walker with 5" wheels    Recommendations for Other Services       Precautions / Restrictions Precautions Precautions: Back Precaution Comments: Pt able to recall 2/3 back precautions. Required Braces or Orthoses: Spinal Brace Spinal Brace: Lumbar corset;Applied in sitting position Restrictions Weight Bearing Restrictions: No    Mobility  Bed Mobility Overal bed mobility: Needs Assistance Bed Mobility: Rolling;Sidelying to Sit;Sit to Sidelying Rolling: Supervision Sidelying to sit: Supervision     Sit to sidelying: Supervision General bed mobility comments: good log roll technique, supervision for safety  Transfers Overall transfer level: Needs assistance Equipment used: Rolling walker (2 wheeled) Transfers: Sit to/from Stand Sit to Stand: Supervision         General transfer comment: supervision for safety  Ambulation/Gait Ambulation/Gait assistance: Supervision Ambulation Distance (Feet): 200 Feet Assistive device: Rolling walker (2 wheeled) Gait Pattern/deviations: Step-through pattern;Decreased stride length Gait velocity: Decreased Gait velocity interpretation: Below normal speed for  age/gender General Gait Details: slow, cautious gait, cueing for postural improvements and to relax shoulders. No instability or LOB, supervision for safety   Stairs            Wheelchair Mobility    Modified Rankin (Stroke Patients Only)       Balance Overall balance assessment: Needs assistance Sitting-balance support: No upper extremity supported;Feet supported Sitting balance-Leahy Scale: Good     Standing balance support: During functional activity Standing balance-Leahy Scale: Fair                              Cognition Arousal/Alertness: Awake/alert Behavior During Therapy: WFL for tasks assessed/performed Overall Cognitive Status: Within Functional Limits for tasks assessed                                        Exercises      General Comments        Pertinent Vitals/Pain Pain Assessment: 0-10 Pain Score: 8  Pain Location: back and LLE  Pain Descriptors / Indicators: Aching;Constant;Discomfort Pain Intervention(s): Monitored during session;Repositioned;Premedicated before session    Home Living                      Prior Function            PT Goals (current goals can now be found in the care plan section) Acute Rehab PT Goals PT Goal Formulation: With patient Time For Goal Achievement: 11/04/16 Potential to Achieve Goals: Good Progress towards PT goals: Progressing toward goals    Frequency    Min 5X/week      PT  Plan Current plan remains appropriate    Co-evaluation              AM-PAC PT "6 Clicks" Daily Activity  Outcome Measure  Difficulty turning over in bed (including adjusting bedclothes, sheets and blankets)?: None Difficulty moving from lying on back to sitting on the side of the bed? : None Difficulty sitting down on and standing up from a chair with arms (e.g., wheelchair, bedside commode, etc,.)?: A Little Help needed moving to and from a bed to chair (including a  wheelchair)?: A Little Help needed walking in hospital room?: A Little Help needed climbing 3-5 steps with a railing? : A Little 6 Click Score: 20    End of Session Equipment Utilized During Treatment: Back brace;Gait belt Activity Tolerance: Patient tolerated treatment well Patient left: in bed;with call bell/phone within reach;with family/visitor present Nurse Communication: Mobility status PT Visit Diagnosis: Other abnormalities of gait and mobility (R26.89);Pain;Difficulty in walking, not elsewhere classified (R26.2) Pain - Right/Left: Left Pain - part of body: Leg     Time: 6861-6837 PT Time Calculation (min) (ACUTE ONLY): 8 min  Charges:  $Gait Training: 8-22 mins                    G Codes:       [place], [place], DPT [place] 10/29/2016, 10:08 AM

## 2016-10-29 NOTE — Therapy (Signed)
Occupational Therapy Evaluation and Discharge Patient Details Name: [name] MRN: [id number] DOB: 1965/03/31 Today's Date: 10/29/2016    History of Present Illness Pt is a 51 y/o female s/p revision of L4-5 PLIF. Pt with previous admission in August for L4-S1 PLIF. PMH includes anxiety, asthma, PTSD, bipolar disorder, OSA back surgery, L knee surgery, and L shoulder surgery.    Clinical Impression   Pt reports using a standard walker for mobility after previous back surgery. Currently, pt requires supervision/set up for all ADLs and min guard for functional mobility for safety and adherence to back precautions. Pt reports family is able to provided 24 hour assistance as needed. No acute OT needs. OT signing off.     Follow Up Recommendations  No OT follow up;Supervision/Assistance - 24 hour    Equipment Recommendations  None recommended by OT (Pt has all needed DME from previous surgery.)    Recommendations for Other Services       Precautions / Restrictions Precautions Precautions: Back Precaution Comments: Pt able to recall 3/3 back precautions. Required Braces or Orthoses: Spinal Brace Spinal Brace: Lumbar corset;Applied in sitting position Restrictions Weight Bearing Restrictions: No      Mobility Bed Mobility Overal bed mobility: Needs Assistance Bed Mobility: Rolling;Sidelying to Sit;Sit to Sidelying Rolling: Supervision Sidelying to sit: Supervision     Sit to sidelying: Supervision General bed mobility comments: Good use of log roll technique. Supervision for safety.   Transfers Overall transfer level: Needs assistance Equipment used: Rolling walker (2 wheeled) Transfers: Sit to/from Stand Sit to Stand: Supervision         General transfer comment: supervision for safety    Balance Overall balance assessment: Needs assistance Sitting-balance support: No upper extremity supported;Feet supported Sitting balance-Leahy Scale: Good     Standing  balance support: Bilateral upper extremity supported;During functional activity Standing balance-Leahy Scale: Fair Standing balance comment: able to maintain static standing without UE support at sink to wash hands.                           ADL either performed or assessed with clinical judgement   ADL Overall ADL's : Needs assistance/impaired     Grooming: Supervision/safety;Set up;Cueing for safety;Cueing for compensatory techniques;Standing   Upper Body Bathing: Supervision/ safety;Set up;Cueing for safety;Cueing for compensatory techniques;Sitting   Lower Body Bathing: Supervison/ safety;Set up;Cueing for safety;Cueing for compensatory techniques;Cueing for back precautions;Sit to/from stand   Upper Body Dressing : Supervision/safety;Set up;Cueing for safety;Cueing for compensatory techniques;Sitting   Lower Body Dressing: Supervision/safety;Set up;Cueing for safety;Cueing for compensatory techniques;Cueing for back precautions;Sit to/from stand   Toilet Transfer: Supervision/safety;Cueing for safety;Ambulation;Comfort height toilet;RW   Toileting- Clothing Manipulation and Hygiene: Supervision/safety;Cueing for safety;Cueing for compensatory techniques;Cueing for back precautions;Sit to/from stand   Tub/ Shower Transfer: Supervision/safety;Set up;Adhering to back precautions;Cueing for safety;Ambulation;Shower seat;Rolling walker   Functional mobility during ADLs: Min guard (For safety) General ADL Comments: Pt educated on compensatory techniques to complete ADLs. Pt able to bring feet over knees to complete LB ADLs. Pt educated on back precautions while completing ADLs.      Vision         Perception     Praxis      Pertinent Vitals/Pain Pain Assessment: 0-10 Pain Score: 10-Worst pain ever Pain Location: back and LLE  Pain Descriptors / Indicators: Aching;Constant;Discomfort;Grimacing Pain Intervention(s): Limited activity within patient's  tolerance;Premedicated before session;Monitored during session     Hand Dominance Right  Extremity/Trunk Assessment Upper Extremity Assessment Upper Extremity Assessment: Overall WFL for tasks assessed   Lower Extremity Assessment Lower Extremity Assessment: Defer to PT evaluation   Cervical / Trunk Assessment Cervical / Trunk Assessment: Other exceptions Cervical / Trunk Exceptions: s/p surgery    Communication Communication Communication: No difficulties   Cognition Arousal/Alertness: Awake/alert Behavior During Therapy: WFL for tasks assessed/performed Overall Cognitive Status: Within Functional Limits for tasks assessed                                     General Comments  Educated on back precautions. Educated pt to take her time and not rush through ADLs/transfers.     Exercises     Shoulder Instructions      Home Living Family/patient expects to be discharged to:: Private residence Living Arrangements: Parent Available Help at Discharge: Family;Available 24 hours/day Type of Home: House Home Access: Stairs to enter [employer] of Steps: 1 Entrance Stairs-Rails: Right;Left Home Layout: One level     Bathroom Shower/Tub: Occupational psychologist: Standard Bathroom Accessibility: Yes How Accessible: Accessible via walker Home Equipment: Walker - standard;Cane - single point;Shower seat - built in;Toilet riser          Prior Functioning/Environment Level of Independence: Independent with assistive device(s)        Comments: Used standard walker since last surgery.        OT Problem List: Decreased strength;Decreased range of motion;Impaired balance (sitting and/or standing);Pain      OT Treatment/Interventions:      OT Goals(Current goals can be found in the care plan section) Acute Rehab OT Goals Patient Stated Goal: To go home OT Goal Formulation: With patient  OT Frequency:     Barriers to D/C:             Co-evaluation              AM-PAC PT "6 Clicks" Daily Activity     Outcome Measure Help from another person eating meals?: None Help from another person taking care of personal grooming?: A Little Help from another person toileting, which includes using toliet, bedpan, or urinal?: A Little Help from another person bathing (including washing, rinsing, drying)?: A Little Help from another person to put on and taking off regular upper body clothing?: A Little Help from another person to put on and taking off regular lower body clothing?: A Little 6 Click Score: 19   End of Session Equipment Utilized During Treatment: Gait belt;Rolling walker;Back brace Nurse Communication: Mobility status  Activity Tolerance: Patient limited by pain Patient left: in bed;with call bell/phone within reach  OT Visit Diagnosis: Unsteadiness on feet (R26.81);Pain Pain - Right/Left: Left Pain - part of body:  (Back)                Time: 3300-7622 OT Time Calculation (min): 13 min Charges:    G-Codes:     Boykin Peek, OTS [phone]   Boykin Peek 10/29/2016, 10:37 AM

## 2016-10-29 NOTE — Progress Notes (Signed)
Pt doing well. Pt and father given D/C instructions with Rx's, verbal understanding was provided. Pt's incision is clean and dry with no sign of infection. Pt's IV was removed prior to D/C. Pt D/C'd home via wheelchair @ 1130 per MD order. Pt is stable @ D/C and has no other needs at this time. Holli Humbles, RN

## 2016-12-30 DIAGNOSIS — M4317 Spondylolisthesis, lumbosacral region: Secondary | ICD-10-CM | POA: Diagnosis not present

## 2017-01-29 DIAGNOSIS — M4317 Spondylolisthesis, lumbosacral region: Secondary | ICD-10-CM | POA: Diagnosis not present

## 2017-02-25 ENCOUNTER — Other Ambulatory Visit: Payer: Self-pay | Admitting: Internal Medicine

## 2017-03-11 DIAGNOSIS — M4317 Spondylolisthesis, lumbosacral region: Secondary | ICD-10-CM | POA: Diagnosis not present

## 2017-03-11 DIAGNOSIS — R030 Elevated blood-pressure reading, without diagnosis of hypertension: Secondary | ICD-10-CM | POA: Diagnosis not present

## 2017-03-12 DIAGNOSIS — F3181 Bipolar II disorder: Secondary | ICD-10-CM | POA: Diagnosis not present

## 2017-03-24 ENCOUNTER — Ambulatory Visit (INDEPENDENT_AMBULATORY_CARE_PROVIDER_SITE_OTHER): Payer: Medicare Other | Admitting: Internal Medicine

## 2017-03-24 ENCOUNTER — Encounter: Payer: Self-pay | Admitting: Internal Medicine

## 2017-03-24 VITALS — BP 136/80 | HR 76 | Temp 97.9°F | Wt 121.0 lb

## 2017-03-24 DIAGNOSIS — J3089 Other allergic rhinitis: Secondary | ICD-10-CM | POA: Diagnosis not present

## 2017-03-24 MED ORDER — PROMETHAZINE-DM 6.25-15 MG/5ML PO SYRP: 5.0000 mL | ORAL_SOLUTION | Freq: Four times a day (QID) | ORAL | 0 refills | Status: DC | PRN

## 2017-03-24 NOTE — Progress Notes (Signed)
Subjective:    Patient ID: [name], female    DOB: [birth year], 52 y.o.   MRN: [id number]  HPI  Pt presents to the clinic today with c/o nasal congestion and cough. This started 1 week ago. She is blowing yellow mucous out of her nose. The cough is non productive. She denies runny nose, ear pain, sore throat or shortness of breath. She denies fever, chills or body aches. She has tried Zyrtec OTC with some relief. She has not had sick contacts.  Review of Systems      Past Medical History:  Diagnosis Date  . Anxiety   . Arthritis   . Asthma   . Bipolar disorder ([place])    currently feeling MANIC- 10/02/2016  . Depression   . GERD (gastroesophageal reflux disease)   . History of blood transfusion    as a newborn   . History of lump of left breast   . Hyperlipidemia   . Lumbar pseudoarthrosis   . OSA (obstructive sleep apnea) 01/09/2016   can't afford CPAP  . Personality disorder ([place])   . PONV (postoperative nausea and vomiting)   . Post traumatic stress disorder (PTSD)   . Substance abuse ([place])   . Synovial cyst     Current Outpatient Medications  Medication Sig Dispense Refill  . albuterol (PROVENTIL HFA;VENTOLIN HFA) 108 (90 Base) MCG/ACT inhaler Inhale 2 puffs into the lungs every 4 (four) hours as needed for wheezing or shortness of breath. 1 Inhaler 1  . carisoprodol (SOMA) 250 MG tablet Take 250 mg by mouth 4 (four) times daily.    Marland Kitchen gabapentin (NEURONTIN) 300 MG capsule Take 1 capsule by mouth 3 (three) times daily.    Marland Kitchen morphine (MS CONTIN) 15 MG 12 hr tablet Take 1 tablet by mouth 3 (three) times daily.    . simvastatin (ZOCOR) 20 MG tablet Take 1 tablet (20 mg total) by mouth daily at 6 PM. MUST SCHEDULE ANNUAL EXAM 30 tablet 0  . buPROPion (WELLBUTRIN XL) 300 MG 24 hr tablet Take 1 tablet (300 mg total) by mouth daily. (Patient not taking: Reported on 03/24/2017) 90 tablet 1  . INVEGA 3 MG 24 hr tablet Take 1 tablet by mouth daily.    .  promethazine-dextromethorphan (PROMETHAZINE-DM) 6.25-15 MG/5ML syrup Take 5 mLs by mouth 4 (four) times daily as needed for cough. 118 mL 0   No current facility-administered medications for this visit.     Allergies  Allergen Reactions  . Effexor [Venlafaxine] Other (See Comments)    UNSPECIFIED REACTION   . Lamictal [Lamotrigine] Rash    Family History  Problem Relation Age of Onset  . Heart disease Father   . Hyperlipidemia Father   . Alcohol abuse Brother   . Alcohol abuse Paternal Uncle   . Colon cancer Neg Hx     Social History   Socioeconomic History  . Marital status: Single    Spouse name: Not on file  . Number of children: Not on file  . Years of education: Not on file  . Highest education level: Not on file  Social Needs  . Financial resource strain: Not on file  . Food insecurity - worry: Not on file  . Food insecurity - inability: Not on file  . Transportation needs - medical: Not on file  . Transportation needs - non-medical: Not on file  Occupational History  . Not on file  Tobacco Use  . Smoking status: Former Smoker    Packs/day:  1.00    Years: 1.00    Pack years: 1.00    Types: Cigarettes  . Smokeless tobacco: Never Used  . Tobacco comment: quit smoking 2016  Substance and Sexual Activity  . Alcohol use: No    Alcohol/week: 0.0 oz    Comment: quit 20 years  . Drug use: Yes    Frequency: 14.0 times per week    Types: Marijuana    Comment: 20 yrs. ago- cocaine   . Sexual activity: Not Currently  Other Topics Concern  . Not on file  Social History Narrative  . Not on file     Constitutional: Denies fever, malaise, fatigue, headache or abrupt weight changes.  HEENT: Pt reports nasal congestion. Denies eye pain, eye redness, ear pain, ringing in the ears, wax buildup, runny nose, bloody nose, or sore throat. Respiratory: Pt reports cough. Denies difficulty breathing, shortness of breath, or sputum production.     No other specific  complaints in a complete review of systems (except as listed in HPI above).  Objective:   Physical Exam  BP 136/80   Pulse 76   Temp 97.9 F (36.6 C) (Oral)   Wt 121 lb (54.9 kg)   SpO2 98%   BMI 22.49 kg/m  Wt Readings from Last 3 Encounters:  03/24/17 121 lb (54.9 kg)  10/28/16 113 lb (51.3 kg)  10/07/16 114 lb (51.7 kg)    General: Appears her stated age, well developed, well nourished in NAD. HEENT: Head: normal shape and size, no sinus tenderness noted; Ears: Tm's gray and intact, normal light reflex; Nose: mucosa pink and moist, septum midline; Throat/Mouth: Teeth present, mucosa pink and moist, + PND, no exudate, lesions or ulcerations noted.  Neck:  No adenopathy noted. Pulmonary/Chest: Normal effort and coarse vesicular breath sounds. No respiratory distress. No wheezes, rales or ronchi noted.   BMET    Component Value Date/Time   NA 136 10/28/2016 0659   K 4.1 10/28/2016 0659   CL 99 (L) 10/28/2016 0659   CO2 28 10/28/2016 0659   GLUCOSE 114 (H) 10/28/2016 0659   BUN 9 10/28/2016 0659   CREATININE 0.80 10/28/2016 0659   CALCIUM 8.8 (L) 10/28/2016 0659   GFRNONAA >60 10/28/2016 0659   GFRAA >60 10/28/2016 0659    Lipid Panel     Component Value Date/Time   CHOL 169 03/15/2015 1507   TRIG 105.0 03/15/2015 1507   HDL 54.30 03/15/2015 1507   CHOLHDL 3 03/15/2015 1507   VLDL 21.0 03/15/2015 1507   LDLCALC 93 03/15/2015 1507    CBC    Component Value Date/Time   WBC 7.9 10/28/2016 0659   RBC 3.43 (L) 10/28/2016 0659   HGB 10.6 (L) 10/28/2016 0659   HCT 31.2 (L) 10/28/2016 0659   PLT 536 (H) 10/28/2016 0659   MCV 91.0 10/28/2016 0659   MCH 30.9 10/28/2016 0659   MCHC 34.0 10/28/2016 0659   RDW 15.2 10/28/2016 0659   LYMPHSABS 2.1 10/02/2016 0945   MONOABS 0.4 10/02/2016 0945   EOSABS 0.2 10/02/2016 0945   BASOSABS 0.0 10/02/2016 0945    Hgb A1C No results found for: HGBA1C          Assessment & Plan:   Allergic Rhinitis:  Continue  Zyrtec Get some Flonase and use BID eRx for Promethazine DM cough syrup No indication for abx at this time  RTC as needed or if symptoms persist or worsen Monetta Lick, NP

## 2017-03-24 NOTE — Patient Instructions (Signed)

## 2017-04-07 ENCOUNTER — Telehealth: Payer: Self-pay | Admitting: Internal Medicine

## 2017-04-07 NOTE — Telephone Encounter (Signed)
Copied from [place] [id number]. Topic: General - Other >> Apr 07, 2017 11:04 AM Darl Householder, RMA wrote: Reason for CRM: Medication refill request for simvastatin 20 mg to be sent to [facility]

## 2017-04-08 MED ORDER — SIMVASTATIN 20 MG PO TABS: 20.0000 mg | ORAL_TABLET | Freq: Every day | ORAL | 1 refills | Status: DC

## 2017-04-22 DIAGNOSIS — M4317 Spondylolisthesis, lumbosacral region: Secondary | ICD-10-CM | POA: Diagnosis not present

## 2017-04-29 ENCOUNTER — Encounter: Payer: Medicare Other | Admitting: Internal Medicine

## 2017-05-21 ENCOUNTER — Encounter: Payer: Medicare Other | Admitting: Internal Medicine

## 2017-05-21 ENCOUNTER — Ambulatory Visit (INDEPENDENT_AMBULATORY_CARE_PROVIDER_SITE_OTHER): Payer: Medicare Other | Admitting: Internal Medicine

## 2017-05-21 ENCOUNTER — Encounter: Payer: Self-pay | Admitting: Internal Medicine

## 2017-05-21 VITALS — BP 126/82 | HR 61 | Temp 97.7°F | Ht 61.0 in | Wt 130.0 lb

## 2017-05-21 DIAGNOSIS — E559 Vitamin D deficiency, unspecified: Secondary | ICD-10-CM

## 2017-05-21 DIAGNOSIS — G894 Chronic pain syndrome: Secondary | ICD-10-CM

## 2017-05-21 DIAGNOSIS — F411 Generalized anxiety disorder: Secondary | ICD-10-CM

## 2017-05-21 DIAGNOSIS — E782 Mixed hyperlipidemia: Secondary | ICD-10-CM | POA: Diagnosis not present

## 2017-05-21 DIAGNOSIS — Z0000 Encounter for general adult medical examination without abnormal findings: Secondary | ICD-10-CM

## 2017-05-21 DIAGNOSIS — Z114 Encounter for screening for human immunodeficiency virus [HIV]: Secondary | ICD-10-CM | POA: Diagnosis not present

## 2017-05-21 DIAGNOSIS — F3170 Bipolar disorder, currently in remission, most recent episode unspecified: Secondary | ICD-10-CM

## 2017-05-21 DIAGNOSIS — J4520 Mild intermittent asthma, uncomplicated: Secondary | ICD-10-CM | POA: Diagnosis not present

## 2017-05-21 LAB — CBC
HCT: 42.9 % (ref 36.0–46.0)
HEMOGLOBIN: 14.6 g/dL (ref 12.0–15.0)
MCHC: 34.1 g/dL (ref 30.0–36.0)
MCV: 91.9 fl (ref 78.0–100.0)
Platelets: 387 10*3/uL (ref 150.0–400.0)
RBC: 4.67 Mil/uL (ref 3.87–5.11)
RDW: 15.9 % — AB (ref 11.5–15.5)
WBC: 6.7 10*3/uL (ref 4.0–10.5)

## 2017-05-21 LAB — VITAMIN D 25 HYDROXY (VIT D DEFICIENCY, FRACTURES): VITD: 18.48 ng/mL — ABNORMAL LOW (ref 30.00–100.00)

## 2017-05-21 NOTE — Patient Instructions (Signed)

## 2017-05-21 NOTE — Progress Notes (Deleted)
Subjective:    Patient ID: [name], female    DOB: 02-08-1966, 52 y.o.   MRN: [id number]  HPI  Pt presents to the clinic today for her annual exam. Seh is also due to follow up chronic conditions.  Anxiety, Bipolar Depression:  GERD:  HLD:  Review of Systems      Past Medical History:  Diagnosis Date  . Anxiety   . Arthritis   . Asthma   . Bipolar disorder ([place])    currently feeling MANIC- 10/02/2016  . Depression   . GERD (gastroesophageal reflux disease)   . History of blood transfusion    as a newborn   . History of lump of left breast   . Hyperlipidemia   . Lumbar pseudoarthrosis   . OSA (obstructive sleep apnea) 01/09/2016   can't afford CPAP  . Personality disorder ([place])   . PONV (postoperative nausea and vomiting)   . Post traumatic stress disorder (PTSD)   . Substance abuse ([place])   . Synovial cyst     Current Outpatient Medications  Medication Sig Dispense Refill  . albuterol (PROVENTIL HFA;VENTOLIN HFA) 108 (90 Base) MCG/ACT inhaler Inhale 2 puffs into the lungs every 4 (four) hours as needed for wheezing or shortness of breath. 1 Inhaler 1  . buPROPion (WELLBUTRIN XL) 300 MG 24 hr tablet Take 1 tablet (300 mg total) by mouth daily. (Patient not taking: Reported on 03/24/2017) 90 tablet 1  . carisoprodol (SOMA) 250 MG tablet Take 250 mg by mouth 4 (four) times daily.    Marland Kitchen gabapentin (NEURONTIN) 300 MG capsule Take 1 capsule by mouth 3 (three) times daily.    . INVEGA 3 MG 24 hr tablet Take 1 tablet by mouth daily.    Marland Kitchen morphine (MS CONTIN) 15 MG 12 hr tablet Take 1 tablet by mouth 3 (three) times daily.    . promethazine-dextromethorphan (PROMETHAZINE-DM) 6.25-15 MG/5ML syrup Take 5 mLs by mouth 4 (four) times daily as needed for cough. 118 mL 0  . simvastatin (ZOCOR) 20 MG tablet Take 1 tablet (20 mg total) by mouth daily at 6 PM. MUST SCHEDULE ANNUAL EXAM 30 tablet 1   No current facility-administered medications for this visit.     Allergies    Allergen Reactions  . Effexor [Venlafaxine] Other (See Comments)    UNSPECIFIED REACTION   . Lamictal [Lamotrigine] Rash    Family History  Problem Relation Age of Onset  . Heart disease Father   . Hyperlipidemia Father   . Alcohol abuse Brother   . Alcohol abuse Paternal Uncle   . Colon cancer Neg Hx     Social History   Socioeconomic History  . Marital status: Single    Spouse name: Not on file  . Number of children: Not on file  . Years of education: Not on file  . Highest education level: Not on file  Occupational History  . Not on file  Social Needs  . Financial resource strain: Not on file  . Food insecurity:    Worry: Not on file    Inability: Not on file  . Transportation needs:    Medical: Not on file    Non-medical: Not on file  Tobacco Use  . Smoking status: Former Smoker    Packs/day: 1.00    Years: 1.00    Pack years: 1.00    Types: Cigarettes  . Smokeless tobacco: Never Used  . Tobacco comment: quit smoking 2016  Substance and Sexual Activity  .  Alcohol use: No    Alcohol/week: 0.0 oz    Comment: quit 20 years  . Drug use: Yes    Frequency: 14.0 times per week    Types: Marijuana    Comment: 20 yrs. ago- cocaine   . Sexual activity: Not Currently  Lifestyle  . Physical activity:    Days per week: Not on file    Minutes per session: Not on file  . Stress: Not on file  Relationships  . Social connections:    Talks on phone: Not on file    Gets together: Not on file    Attends religious service: Not on file    Active member of club or organization: Not on file    Attends meetings of clubs or organizations: Not on file    Relationship status: Not on file  . Intimate partner violence:    Fear of current or ex partner: Not on file    Emotionally abused: Not on file    Physically abused: Not on file    Forced sexual activity: Not on file  Other Topics Concern  . Not on file  Social History Narrative  . Not on file     Constitutional:  Denies fever, malaise, fatigue, headache or abrupt weight changes.  HEENT: Denies eye pain, eye redness, ear pain, ringing in the ears, wax buildup, runny nose, nasal congestion, bloody nose, or sore throat. Respiratory: Denies difficulty breathing, shortness of breath, cough or sputum production.   Cardiovascular: Denies chest pain, chest tightness, palpitations or swelling in the hands or feet.  Gastrointestinal: Denies abdominal pain, bloating, constipation, diarrhea or blood in the stool.  GU: Denies urgency, frequency, pain with urination, burning sensation, blood in urine, odor or discharge. Musculoskeletal: Denies decrease in range of motion, difficulty with gait, muscle pain or joint pain and swelling.  Skin: Denies redness, rashes, lesions or ulcercations.  Neurological: Denies dizziness, difficulty with memory, difficulty with speech or problems with balance and coordination.  Psych: Denies anxiety, depression, SI/HI.  No other specific complaints in a complete review of systems (except as listed in HPI above).  Objective:   Physical Exam        Assessment & Plan:

## 2017-05-22 LAB — COMPREHENSIVE METABOLIC PANEL
ALT: 13 U/L (ref 0–35)
AST: 15 U/L (ref 0–37)
Albumin: 4.4 g/dL (ref 3.5–5.2)
Alkaline Phosphatase: 67 U/L (ref 39–117)
BILIRUBIN TOTAL: 0.3 mg/dL (ref 0.2–1.2)
BUN: 11 mg/dL (ref 6–23)
CO2: 27 mEq/L (ref 19–32)
Calcium: 9.6 mg/dL (ref 8.4–10.5)
Chloride: 102 mEq/L (ref 96–112)
Creatinine, Ser: 0.8 mg/dL (ref 0.40–1.20)
GFR: 79.9 mL/min (ref >60.00)
GLUCOSE: 65 mg/dL — AB (ref 70–99)
Potassium: 4.9 mEq/L (ref 3.5–5.1)
SODIUM: 138 meq/L (ref 135–145)
Total Protein: 7.6 g/dL (ref 6.0–8.3)

## 2017-05-22 LAB — LIPID PANEL
CHOL/HDL RATIO: 3
Cholesterol: 168 mg/dL (ref 0–200)
HDL: 65.7 mg/dL (ref >39.00)
LDL Cholesterol: 75 mg/dL (ref 0–99)
NONHDL: 102.08
Triglycerides: 137 mg/dL (ref 0.0–149.0)
VLDL: 27.4 mg/dL (ref 0.0–40.0)

## 2017-05-22 LAB — HIV ANTIBODY (ROUTINE TESTING W REFLEX): HIV: NONREACTIVE

## 2017-05-23 DIAGNOSIS — F411 Generalized anxiety disorder: Secondary | ICD-10-CM | POA: Insufficient documentation

## 2017-05-23 NOTE — Assessment & Plan Note (Signed)
Currently not on meds or following with psych Discussed red flags that she needs to see treatment Support offered today

## 2017-05-23 NOTE — Assessment & Plan Note (Signed)
Discussed smoking cessation, she declines Continue Albuterol prn

## 2017-05-23 NOTE — Assessment & Plan Note (Signed)
CMET and lipid profile today Encouraged her to consume a low fat diet Will adjust Simvastatin if needed based on labs

## 2017-05-23 NOTE — Progress Notes (Signed)
HPI:  Pt presents to the clinic today for her Medicare Wellness Exam. She is also due to follow up chronic conditions.   Anxiety/Bipolar Depression: Chronic. She is currently not taking anything for this, she weaned herself off. She has not seen psych in the last 6 months. She denies SI/HI.  Asthma: She reports mild cough, but does continue to smoke. She uses Albuterol 1-2 times per month.  GERD: Currently not an issue. She is not taking anything for this at this time.  HLD: Her last LDL was 93, 03/2015. She denies myalgias on Simvastatin. She does not consume a low fat diet.  Chronic Low Back Pain: failed surgery. She is taking MS Contin, Gabapentin and Soma.  She follows with Dr. Annette Stable. She is currently wearing a back brace.  Past Medical History:  Diagnosis Date  . Anxiety   . Arthritis   . Asthma   . Bipolar disorder ([place])    currently feeling MANIC- 10/02/2016  . Depression   . GERD (gastroesophageal reflux disease)   . History of blood transfusion    as a newborn   . History of lump of left breast   . Hyperlipidemia   . Lumbar pseudoarthrosis   . OSA (obstructive sleep apnea) 01/09/2016   can't afford CPAP  . Personality disorder ([place])   . PONV (postoperative nausea and vomiting)   . Post traumatic stress disorder (PTSD)   . Substance abuse ([place])   . Synovial cyst     Current Outpatient Medications  Medication Sig Dispense Refill  . albuterol (PROVENTIL HFA;VENTOLIN HFA) 108 (90 Base) MCG/ACT inhaler Inhale 2 puffs into the lungs every 4 (four) hours as needed for wheezing or shortness of breath. 1 Inhaler 1  . buPROPion (WELLBUTRIN XL) 300 MG 24 hr tablet Take 1 tablet (300 mg total) by mouth daily. (Patient not taking: Reported on 03/24/2017) 90 tablet 1  . carisoprodol (SOMA) 250 MG tablet Take 250 mg by mouth 4 (four) times daily.    Marland Kitchen gabapentin (NEURONTIN) 300 MG capsule Take 1 capsule by mouth 3 (three) times daily.    . INVEGA 3 MG 24 hr tablet Take 1 tablet by  mouth daily.    Marland Kitchen morphine (MS CONTIN) 15 MG 12 hr tablet Take 1 tablet by mouth 3 (three) times daily.    . simvastatin (ZOCOR) 20 MG tablet Take 1 tablet (20 mg total) by mouth daily at 6 PM. MUST SCHEDULE ANNUAL EXAM 30 tablet 1   No current facility-administered medications for this visit.     Allergies  Allergen Reactions  . Effexor [Venlafaxine] Other (See Comments)    UNSPECIFIED REACTION   . Lamictal [Lamotrigine] Rash    Family History  Problem Relation Age of Onset  . Heart disease Father   . Hyperlipidemia Father   . Alcohol abuse Brother   . Alcohol abuse Paternal Uncle   . Colon cancer Neg Hx     Social History   Socioeconomic History  . Marital status: Single    Spouse name: Not on file  . Number of children: Not on file  . Years of education: Not on file  . Highest education level: Not on file  Occupational History  . Not on file  Social Needs  . Financial resource strain: Not on file  . Food insecurity:    Worry: Not on file    Inability: Not on file  . Transportation needs:    Medical: Not on file    Non-medical: Not  on file  Tobacco Use  . Smoking status: Former Smoker    Packs/day: 1.00    Years: 1.00    Pack years: 1.00    Types: Cigarettes  . Smokeless tobacco: Never Used  . Tobacco comment: quit smoking 2016  Substance and Sexual Activity  . Alcohol use: No    Alcohol/week: 0.0 oz    Comment: quit 20 years  . Drug use: Yes    Frequency: 14.0 times per week    Types: Marijuana    Comment: 20 yrs. ago- cocaine   . Sexual activity: Not Currently  Lifestyle  . Physical activity:    Days per week: Not on file    Minutes per session: Not on file  . Stress: Not on file  Relationships  . Social connections:    Talks on phone: Not on file    Gets together: Not on file    Attends religious service: Not on file    Active member of club or organization: Not on file    Attends meetings of clubs or organizations: Not on file     Relationship status: Not on file  . Intimate partner violence:    Fear of current or ex partner: Not on file    Emotionally abused: Not on file    Physically abused: Not on file    Forced sexual activity: Not on file  Other Topics Concern  . Not on file  Social History Narrative  . Not on file    Hospitiliaztions: None  Health Maintenance:    Flu: 12/2015  Tetanus: unsure  Mammogram: 07/2016  Pap Smear: 2017, [facility]  Colon Screening: never  Eye Doctor: as needed  Dental Exam: as needed   Providers:   PCP: Webb Silversmith, NP-C  Pulmonologist: Dr. Corrie Dandy  Neurosurgeon: Dr. Annette Stable  Psychiatry: Dr. Sabra Heck   I have personally reviewed and have noted:  1. The patient's medical and social history 2. Their use of alcohol, tobacco or illicit drugs 3. Their current medications and supplements 4. The patient's functional ability including ADL's, fall risks, home safety risks and hearing or visual impairment. 5. Diet and physical activities 6. Evidence for depression or mood disorder  Subjective:   Review of Systems:   Constitutional: Denies fever, malaise, fatigue, headache or abrupt weight changes.  HEENT: Denies eye pain, eye redness, ear pain, ringing in the ears, wax buildup, runny nose, nasal congestion, bloody nose, or sore throat. Respiratory: Pt reports cough. Denies difficulty breathing, shortness of breath, or sputum production.   Cardiovascular: Denies chest pain, chest tightness, palpitations or swelling in the hands or feet.  Gastrointestinal: Denies abdominal pain, bloating, constipation, diarrhea or blood in the stool.  GU: Denies urgency, frequency, pain with urination, burning sensation, blood in urine, odor or discharge. Musculoskeletal: Pt reports back pain. Denies difficulty with gait, muscle pain or joint swelling.  Skin: Denies redness, rashes, lesions or ulcercations.  Neurological: Denies dizziness, difficulty with memory, difficulty with speech or  problems with balance and coordination.  Psych: Pt has a history of anxiety and depression. Denies SI/HI.  No other specific complaints in a complete review of systems (except as listed in HPI above).  Objective:  PE:   BP 126/82   Pulse 61   Temp 97.7 F (36.5 C) (Oral)   Ht 5\' 1"  (1.549 m)   Wt 130 lb (59 kg)   SpO2 98%   BMI 24.56 kg/m  Wt Readings from Last 3 Encounters:  05/21/17 130  lb (59 kg)  03/24/17 121 lb (54.9 kg)  10/28/16 113 lb (51.3 kg)    General: Appears her stated age, well developed, well nourished in NAD. Skin: Warm, dry and intact.  HEENT: Head: normal shape and size; Eyes: sclera white, no icterus, conjunctiva pink, PERRLA and EOMs intact; Ears: Tm's gray and intact, normal light reflex; Throat/Mouth: Teeth present, mucosa pink and moist, no exudate, lesions or ulcerations noted.  Neck: Neck supple, trachea midline. No masses, lumps or thyromegaly present.  Cardiovascular: Normal rate and rhythm. S1,S2 noted.  No murmur, rubs or gallops noted. No JVD or BLE edema.  Pulmonary/Chest: Normal effort and positive vesicular breath sounds. No respiratory distress. No wheezes, rales or ronchi noted.  Musculoskeletal: Strength 4/5 LLE 5/5 all other extremities. Gait slow but steady.  Neurological: Alert and oriented. Cranial nerves II-XII grossly intact. Coordination normal.  Psychiatric: Mood and affect normal. Behavior is normal. Judgment and thought content normal.    BMET    Component Value Date/Time   NA 138 05/21/2017 1422   K 4.9 05/21/2017 1422   CL 102 05/21/2017 1422   CO2 27 05/21/2017 1422   GLUCOSE 65 (L) 05/21/2017 1422   BUN 11 05/21/2017 1422   CREATININE 0.80 05/21/2017 1422   CALCIUM 9.6 05/21/2017 1422   GFRNONAA >60 10/28/2016 0659   GFRAA >60 10/28/2016 0659    Lipid Panel     Component Value Date/Time   CHOL 168 05/21/2017 1422   TRIG 137.0 05/21/2017 1422   HDL 65.70 05/21/2017 1422   CHOLHDL 3 05/21/2017 1422   VLDL 27.4  05/21/2017 1422   LDLCALC 75 05/21/2017 1422    CBC    Component Value Date/Time   WBC 6.7 05/21/2017 1422   RBC 4.67 05/21/2017 1422   HGB 14.6 05/21/2017 1422   HCT 42.9 05/21/2017 1422   PLT 387.0 05/21/2017 1422   MCV 91.9 05/21/2017 1422   MCH 30.9 10/28/2016 0659   MCHC 34.1 05/21/2017 1422   RDW 15.9 (H) 05/21/2017 1422   LYMPHSABS 2.1 10/02/2016 0945   MONOABS 0.4 10/02/2016 0945   EOSABS 0.2 10/02/2016 0945   BASOSABS 0.0 10/02/2016 0945    Hgb A1C No results found for: HGBA1C    Assessment and Plan:   Medicare Annual Wellness Visit:  Diet: She does eat meat. She consumes fruits and veggies. She eats fried foods. She drinks mostly soda and sweet tea. Physical activity: Sedentary Depression/mood screen: Negative at this time, but long history of this Hearing: Intact to whispered voice Visual acuity: Grossly normal, performs annual eye exam  ADLs: Capable Fall risk: None Home safety: Good Cognitive evaluation: Intact to orientation, naming, recall and repetition EOL planning: No adv directives, full code/ I agree  Preventative Medicine: She declines flu, tetanus shots. Pap smear and mammogram UTD. She declines colonoscopy but wants to check with insurance to see if Cologuard is covered. Encouraged her to consume a balanced diet and exercise regimen. Advised her to see an eye doctor and dentist annually. Will check CBC, CMET, Lipid, Vit D and HIV today.   Next appointment: 1 year, Medicare Wellness Exam   Webb Silversmith, NP

## 2017-05-23 NOTE — Assessment & Plan Note (Signed)
Follows with Dr. Annette Stable

## 2017-05-24 ENCOUNTER — Other Ambulatory Visit: Payer: Self-pay | Admitting: Internal Medicine

## 2017-05-24 DIAGNOSIS — E559 Vitamin D deficiency, unspecified: Secondary | ICD-10-CM

## 2017-05-24 MED ORDER — VITAMIN D (ERGOCALCIFEROL) 1.25 MG (50000 UNIT) PO CAPS: 50000.0000 [IU] | ORAL_CAPSULE | ORAL | 0 refills | Status: DC

## 2017-05-27 ENCOUNTER — Other Ambulatory Visit: Payer: Self-pay | Admitting: Internal Medicine

## 2017-05-27 DIAGNOSIS — Z1231 Encounter for screening mammogram for malignant neoplasm of breast: Secondary | ICD-10-CM

## 2017-06-03 DIAGNOSIS — M4317 Spondylolisthesis, lumbosacral region: Secondary | ICD-10-CM | POA: Diagnosis not present

## 2017-06-07 ENCOUNTER — Other Ambulatory Visit: Payer: Self-pay | Admitting: Internal Medicine

## 2017-06-12 ENCOUNTER — Ambulatory Visit (INDEPENDENT_AMBULATORY_CARE_PROVIDER_SITE_OTHER): Payer: Medicare HMO | Admitting: Family Medicine

## 2017-06-12 ENCOUNTER — Encounter: Payer: Self-pay | Admitting: Family Medicine

## 2017-06-12 DIAGNOSIS — F319 Bipolar disorder, unspecified: Secondary | ICD-10-CM | POA: Diagnosis not present

## 2017-06-12 DIAGNOSIS — R030 Elevated blood-pressure reading, without diagnosis of hypertension: Secondary | ICD-10-CM

## 2017-06-12 NOTE — Patient Instructions (Addendum)
Your pressure was fine today.  Your pressure has not been significantly elevated here.  It is likely reasonable to talk with psychiatry about options for your mood.  Update [place] as needed in the meantime.   Take care.  Glad to see you.

## 2017-06-12 NOTE — Progress Notes (Signed)
She was depressed and went to [facility] for eval yesterday.  BP was up there, so she came in here for eval today re: BP.  She has been in mental health clinics since the 90s.  She has eval pending for later this month.  She had prev weaned off meds earlier this year.  No CP, SOB, BLE edema.    She didn't tolerate wellbutrin earlier this month.  Prev was on paxil, along with mult other meds over the years.  No SI/HI.  She contracts for safety.    Patient was okay with me talking to PCP, which I did.    In back brace on pain meds at baseline.  Still seeing Dr. Annette Stable.   On disability for years.    Meds, vitals, and allergies reviewed.   ROS: Per HPI unless specifically indicated in ROS section   GEN: nad, alert and oriented, nearly tearful but regains composure.  Speech and judgment appear intact.  Not suicidal.  Not homicidal.  Contracts for safety. HEENT: mucous membranes moist NECK: supple w/o LA CV: rrr.  PULM: ctab, no inc wob ABD: Wearing back brace. EXT: no edema SKIN: no acute rash

## 2017-06-13 DIAGNOSIS — R030 Elevated blood-pressure reading, without diagnosis of hypertension: Secondary | ICD-10-CM | POA: Insufficient documentation

## 2017-06-13 NOTE — Assessment & Plan Note (Signed)
Discussed with patient about options.  She does not have a clear diagnosis of hypertension.  No reason to start antihypertensive medication.

## 2017-06-13 NOTE — Assessment & Plan Note (Signed)
Follow-up with psychiatry versus psychology.  She was more interested in counseling. She can get a list of counselors in the area in her network and we could look at the list and potentially provide some references based on that.  She agrees.  Okay for outpatient follow-up.  Update me as needed.  This plan was communicated to the PCP.  I appreciate the help of all involved. She is not suicidal nor homicidal.  She contracts for safety.

## 2017-07-09 DIAGNOSIS — F3181 Bipolar II disorder: Secondary | ICD-10-CM | POA: Diagnosis not present

## 2017-07-22 ENCOUNTER — Ambulatory Visit
Admission: RE | Admit: 2017-07-22 | Discharge: 2017-07-22 | Disposition: A | Discharge status: Home / Self Care | Payer: Medicare HMO | Source: Ambulatory Visit | Attending: Internal Medicine | Admitting: Internal Medicine

## 2017-07-22 DIAGNOSIS — Z1231 Encounter for screening mammogram for malignant neoplasm of breast: Secondary | ICD-10-CM

## 2017-08-05 DIAGNOSIS — M4317 Spondylolisthesis, lumbosacral region: Secondary | ICD-10-CM | POA: Diagnosis not present

## 2017-08-11 ENCOUNTER — Other Ambulatory Visit: Payer: Self-pay | Admitting: Internal Medicine

## 2017-09-01 DIAGNOSIS — R112 Nausea with vomiting, unspecified: Secondary | ICD-10-CM | POA: Diagnosis not present

## 2017-09-01 DIAGNOSIS — A09 Infectious gastroenteritis and colitis, unspecified: Secondary | ICD-10-CM | POA: Diagnosis not present

## 2017-09-01 DIAGNOSIS — E860 Dehydration: Secondary | ICD-10-CM | POA: Diagnosis not present

## 2017-09-01 DIAGNOSIS — R197 Diarrhea, unspecified: Secondary | ICD-10-CM | POA: Diagnosis not present

## 2017-09-10 ENCOUNTER — Ambulatory Visit (INDEPENDENT_AMBULATORY_CARE_PROVIDER_SITE_OTHER): Payer: Medicare HMO | Admitting: Internal Medicine

## 2017-09-10 ENCOUNTER — Encounter: Payer: Self-pay | Admitting: Internal Medicine

## 2017-09-10 VITALS — BP 124/70 | HR 74 | Temp 98.1°F | Wt 136.0 lb

## 2017-09-10 DIAGNOSIS — R1032 Left lower quadrant pain: Secondary | ICD-10-CM

## 2017-09-10 DIAGNOSIS — R197 Diarrhea, unspecified: Secondary | ICD-10-CM

## 2017-09-10 DIAGNOSIS — R112 Nausea with vomiting, unspecified: Secondary | ICD-10-CM | POA: Diagnosis not present

## 2017-09-10 LAB — CBC
HEMATOCRIT: 39.4 % (ref 36.0–46.0)
Hemoglobin: 13.5 g/dL (ref 12.0–15.0)
MCHC: 34.4 g/dL (ref 30.0–36.0)
MCV: 93.7 fl (ref 78.0–100.0)
Platelets: 344 10*3/uL (ref 150.0–400.0)
RBC: 4.21 Mil/uL (ref 3.87–5.11)
RDW: 14.6 % (ref 11.5–15.5)
WBC: 7.1 10*3/uL (ref 4.0–10.5)

## 2017-09-10 LAB — COMPREHENSIVE METABOLIC PANEL
ALT: 13 U/L (ref 0–35)
AST: 14 U/L (ref 0–37)
Albumin: 3.8 g/dL (ref 3.5–5.2)
Alkaline Phosphatase: 72 U/L (ref 39–117)
BUN: 8 mg/dL (ref 6–23)
CHLORIDE: 99 meq/L (ref 96–112)
CO2: 28 meq/L (ref 19–32)
CREATININE: 0.71 mg/dL (ref 0.40–1.20)
Calcium: 9.1 mg/dL (ref 8.4–10.5)
GFR: 91.59 mL/min (ref >60.00)
Glucose, Bld: 71 mg/dL (ref 70–99)
POTASSIUM: 4.5 meq/L (ref 3.5–5.1)
SODIUM: 133 meq/L — AB (ref 135–145)
Total Bilirubin: 0.2 mg/dL (ref 0.2–1.2)
Total Protein: 6.5 g/dL (ref 6.0–8.3)

## 2017-09-10 LAB — LIPASE: Lipase: 16 U/L (ref 11.0–59.0)

## 2017-09-10 LAB — AMYLASE: AMYLASE: 47 U/L (ref 27–131)

## 2017-09-10 MED ORDER — DIPHENOXYLATE-ATROPINE 2.5-0.025 MG PO TABS: 1.0000 | ORAL_TABLET | Freq: Every day | ORAL | 0 refills | Status: DC | PRN

## 2017-09-10 MED ORDER — PROMETHAZINE HCL 25 MG PO TABS: 25.0000 mg | ORAL_TABLET | Freq: Three times a day (TID) | ORAL | 0 refills | Status: DC | PRN

## 2017-09-10 NOTE — Patient Instructions (Signed)
Nausea, Adult Feeling sick to your stomach (nausea) means that your stomach is upset or you feel like you have to throw up (vomit). Feeling sick to your stomach is usually not serious, but it may be an early sign of a more serious medical problem. As you feel sicker to your stomach, it can lead to throwing up (vomiting). If you throw up, or if you are not able to drink enough fluids, there is a risk of dehydration. Dehydration can make you feel tired and thirsty, have a dry mouth, and pee (urinate) less often. Older adults and people who have other diseases or a weak defense (immune) system have a higher risk of dehydration. The main goal of treating this condition is to:  Limit how often you feel sick to your stomach.  Prevent throwing up and dehydration.  Follow these instructions at home: Follow instructions from your doctor about how to care for yourself at home. Eating and drinking Follow these recommendations as told by your doctor:  Take an oral rehydration solution (ORS). This is a drink that is sold at pharmacies and stores.  Drink clear fluids in small amounts as you are able, such as: ? Water. ? Ice chips. ? Fruit juice that has water added (diluted fruit juice). ? Low-calorie sports drinks.  Eat bland, easy to digest foods in small amounts as you are able, such as: ? Bananas. ? Applesauce. ? Rice. ? Lean meats. ? Toast. ? Crackers.  Avoid drinking fluids that contain a lot of sugar or caffeine.  Avoid alcohol.  Avoid spicy or fatty foods.  General instructions  Drink enough fluid to keep your pee (urine) clear or pale yellow.  Wash your hands often. If you cannot use soap and water, use hand sanitizer.  Make sure that all people in your household wash their hands well and often.  Rest at home while you get better.  Take over-the-counter and prescription medicines only as told by your doctor.  Breathe slowly and deeply when you feel sick to your  stomach.  Watch your condition for any changes.  Keep all follow-up visits as told by your doctor. This is important. Contact a doctor if:  You have a headache.  You have new symptoms.  You feel sicker to your stomach.  You have a fever.  You feel light-headed or dizzy.  You throw up.  You are not able to keep fluids down. Get help right away if:  You have pain in your chest, neck, arm, or jaw.  You feel very weak or you pass out (faint).  You have throw up that is bright red or looks like coffee grounds.  You have bloody or black poop (stools), or poop that looks like tar.  You have a very bad headache, a stiff neck, or both.  You have very bad pain, cramping, or bloating in your belly.  You have a rash.  You have trouble breathing or you are breathing very quickly.  Your heart is beating very quickly.  Your skin feels cold and clammy.  You feel confused.  You have pain while peeing.  You have signs of dehydration, such as: ? Dark pee, or very little or no pee. ? Cracked lips. ? Dry mouth. ? Sunken eyes. ? Sleepiness. ? Weakness. These symptoms may be an emergency. Do not wait to see if the symptoms will go away. Get medical help right away. Call your local emergency services (911 in the [place] Do not drive yourself to   the hospital. This information is not intended to replace advice given to you by your health care provider. Make sure you discuss any questions you have with your health care provider. Document Released: 02/06/2011 Document Revised: 07/26/2015 Document Reviewed: 10/24/2014 Elsevier Interactive Patient Education  2018 Elsevier Inc.  

## 2017-09-10 NOTE — Progress Notes (Signed)
Subjective:    Patient ID: [name], female    DOB: 04-16-1965, 52 y.o.   MRN: [id number]  HPI  Pt presents to the clinic for UC follow up. She [place] was at the beach last [place]. She started having nausea, vomiting and diarrhea. She reports associated left upper quadrant pain that radiated around to her back. She describes the pain as constant, sore and achy. She denies blood in her stool, urinary or vaginal complaints. No labs or imaging were done. She was given RX for Lomotil and Promethazine, which she is requesting refills of today. She denies recent changes in diet or medications. She reports the UC doctor told her it could be her gallbladder. She still has some nausea and loose stool but denies abdominal pain or vomiting.   Review of Systems  Past Medical History:  Diagnosis Date  . Anxiety   . Arthritis   . Asthma   . Bipolar disorder ([place])    currently feeling MANIC- 10/02/2016  . Depression   . GERD (gastroesophageal reflux disease)   . History of blood transfusion    as a newborn   . History of lump of left breast   . Hyperlipidemia   . Lumbar pseudoarthrosis   . OSA (obstructive sleep apnea) 01/09/2016   can't afford CPAP  . Personality disorder ([place])   . PONV (postoperative nausea and vomiting)   . Post traumatic stress disorder (PTSD)   . Substance abuse ([place])   . Synovial cyst     Current Outpatient Medications  Medication Sig Dispense Refill  . albuterol (PROVENTIL HFA;VENTOLIN HFA) 108 (90 Base) MCG/ACT inhaler Inhale 2 puffs into the lungs every 4 (four) hours as needed for wheezing or shortness of breath. 1 Inhaler 1  . aspirin 81 MG tablet Take 81 mg by mouth daily.    . carisoprodol (SOMA) 250 MG tablet Take 250 mg by mouth 4 (four) times daily.    Marland Kitchen gabapentin (NEURONTIN) 300 MG capsule Take 1 capsule by mouth 3 (three) times daily.    Marland Kitchen morphine (MS CONTIN) 15 MG 12 hr tablet Take 1 tablet by mouth 3 (three) times daily.    . simvastatin (ZOCOR)  20 MG tablet Take 1 tablet (20 mg total) by mouth daily at 6 PM. 30 tablet 10  . Vitamin D, Ergocalciferol, (DRISDOL) 50000 units CAPS capsule Take 1 capsule (50,000 Units total) by mouth every 7 (seven) days. 12 capsule 0   No current facility-administered medications for this visit.     Allergies  Allergen Reactions  . Effexor [Venlafaxine] Other (See Comments)    UNSPECIFIED REACTION   . Lamictal [Lamotrigine] Rash    Family History  Problem Relation Age of Onset  . Heart disease Father   . Hyperlipidemia Father   . Alcohol abuse Brother   . Alcohol abuse Paternal Uncle   . Breast cancer Maternal Aunt        70's  . Colon cancer Neg Hx     Social History   Socioeconomic History  . Marital status: Single    Spouse name: Not on file  . Number of children: Not on file  . Years of education: Not on file  . Highest education level: Not on file  Occupational History  . Not on file  Social Needs  . Financial resource strain: Not on file  . Food insecurity:    Worry: Not on file    Inability: Not on file  . Transportation needs:  Medical: Not on file    Non-medical: Not on file  Tobacco Use  . Smoking status: Former Smoker    Packs/day: 1.00    Years: 1.00    Pack years: 1.00    Types: Cigarettes  . Smokeless tobacco: Never Used  . Tobacco comment: quit smoking 2016  Substance and Sexual Activity  . Alcohol use: No    Alcohol/week: 0.0 oz    Comment: quit 20 years  . Drug use: Yes    Frequency: 14.0 times per week    Types: Marijuana    Comment: 20 yrs. ago- cocaine   . Sexual activity: Not Currently  Lifestyle  . Physical activity:    Days per week: Not on file    Minutes per session: Not on file  . Stress: Not on file  Relationships  . Social connections:    Talks on phone: Not on file    Gets together: Not on file    Attends religious service: Not on file    Active member of club or organization: Not on file    Attends meetings of clubs or  organizations: Not on file    Relationship status: Not on file  . Intimate partner violence:    Fear of current or ex partner: Not on file    Emotionally abused: Not on file    Physically abused: Not on file    Forced sexual activity: Not on file  Other Topics Concern  . Not on file  Social History Narrative  . Not on file     Constitutional: Denies fever, malaise, fatigue, headache or abrupt weight changes.  Respiratory: Denies difficulty breathing, shortness of breath, cough or sputum production.   Cardiovascular: Denies chest pain, chest tightness, palpitations or swelling in the hands or feet.  Gastrointestinal: Pt reports nausea and loose stool. Denies abdominal pain, bloating, constipation, diarrhea or blood in the stool.  GU: Denies urgency, frequency, pain with urination, burning sensation, blood in urine, odor or discharge.  No other specific complaints in a complete review of systems (except as listed in HPI above).     Objective:   Physical Exam  BP 124/70   Pulse 74   Temp 98.1 F (36.7 C) (Oral)   Wt 136 lb (61.7 kg)   SpO2 97%   BMI 25.70 kg/m  Wt Readings from Last 3 Encounters:  09/10/17 136 lb (61.7 kg)  06/12/17 128 lb 12 oz (58.4 kg)  05/21/17 130 lb (59 kg)    General: Appears her stated age, well developed, well nourished in NAD. Cardiovascular: Normal rate and rhythm.  Pulmonary/Chest: Normal effort and positive vesicular breath sounds. No respiratory distress. No wheezes, rales or ronchi noted.  Abdomen: Soft and tender in the LLQ. Hyperactive bowel sounds. No distention or masses noted. Liver, spleen and kidneys non palpable.   BMET    Component Value Date/Time   NA 138 05/21/2017 1422   K 4.9 05/21/2017 1422   CL 102 05/21/2017 1422   CO2 27 05/21/2017 1422   GLUCOSE 65 (L) 05/21/2017 1422   BUN 11 05/21/2017 1422   CREATININE 0.80 05/21/2017 1422   CALCIUM 9.6 05/21/2017 1422   GFRNONAA >60 10/28/2016 0659   GFRAA >60 10/28/2016 0659     Lipid Panel     Component Value Date/Time   CHOL 168 05/21/2017 1422   TRIG 137.0 05/21/2017 1422   HDL 65.70 05/21/2017 1422   CHOLHDL 3 05/21/2017 1422   VLDL 27.4 05/21/2017 1422  LDLCALC 75 05/21/2017 1422    CBC    Component Value Date/Time   WBC 6.7 05/21/2017 1422   RBC 4.67 05/21/2017 1422   HGB 14.6 05/21/2017 1422   HCT 42.9 05/21/2017 1422   PLT 387.0 05/21/2017 1422   MCV 91.9 05/21/2017 1422   MCH 30.9 10/28/2016 0659   MCHC 34.1 05/21/2017 1422   RDW 15.9 (H) 05/21/2017 1422   LYMPHSABS 2.1 10/02/2016 0945   MONOABS 0.4 10/02/2016 0945   EOSABS 0.2 10/02/2016 0945   BASOSABS 0.0 10/02/2016 0945    Hgb A1C No results found for: HGBA1C          Assessment & Plan:   UC Follow Up for Nausea, Vomiting, Diarrhea and Abdominal Pain:  No notes to review Symptoms improved Will check CBC, CMET, Amylase, Lipase Consider abdominal ultrasound if labs abnormal Lomotil and Promethazine refilled today  Return/ER precautions discussed Webb Silversmith, NP

## 2017-09-14 ENCOUNTER — Telehealth: Payer: Self-pay | Admitting: Internal Medicine

## 2017-09-14 NOTE — Telephone Encounter (Signed)
Copied from [place] [id number]. Topic: Inquiry >> Sep 14, 2017  2:30 PM Margot Ables wrote: Reason for CRM: pt calling for results from 09/10/17 labs. Pt requesting call back.

## 2017-09-15 NOTE — Telephone Encounter (Signed)
Pt is calling to check status of results

## 2017-09-28 ENCOUNTER — Telehealth: Payer: Self-pay

## 2017-09-28 DIAGNOSIS — R1011 Right upper quadrant pain: Secondary | ICD-10-CM

## 2017-09-28 DIAGNOSIS — R112 Nausea with vomiting, unspecified: Secondary | ICD-10-CM

## 2017-09-28 DIAGNOSIS — R1084 Generalized abdominal pain: Secondary | ICD-10-CM

## 2017-09-28 DIAGNOSIS — R197 Diarrhea, unspecified: Secondary | ICD-10-CM

## 2017-09-28 NOTE — Telephone Encounter (Signed)
Copied from [employer] [id number]. Topic: Appointment Scheduling - Scheduling Inquiry for Clinic >> Sep 28, 2017 10:33 AM Synthia Innocent wrote: Reason for CRM: Requesting to schedule 2nd opinion for her gallbladder with Dr Damita Dunnings, PCP is Webb Silversmith. Please advise

## 2017-09-29 NOTE — Telephone Encounter (Signed)
I don't have much to offer in this case.  If she is having recurrent sx, then she should notify PCP. Thanks.

## 2017-09-29 NOTE — Telephone Encounter (Signed)
We need to order ultrasound first. Is she agreeable?

## 2017-09-29 NOTE — Telephone Encounter (Signed)
Spoke with patient. Patient would like to get a referral to a "gallbladder specialist." [place] [facility] [facility] week of [place] 4th, that he felt it was gallbladder related. Patient is still having nausea, vomiting and diarrhea off and on and lasts almost 48 hours before resolving. Please review.-Jamison Soward V Gaje Tennyson, RMA

## 2017-09-30 NOTE — Addendum Note (Signed)
Addended by: Jearld Fenton on: 09/30/2017 01:05 PM   Modules accepted: Orders

## 2017-09-30 NOTE — Telephone Encounter (Signed)
Referral placed.

## 2017-09-30 NOTE — Telephone Encounter (Signed)
Pt would like to have the [place]

## 2017-10-08 ENCOUNTER — Ambulatory Visit
Admission: RE | Admit: 2017-10-08 | Discharge: 2017-10-08 | Disposition: A | Discharge status: Home / Self Care | Payer: Medicare HMO | Source: Ambulatory Visit | Attending: Internal Medicine | Admitting: Internal Medicine

## 2017-10-08 DIAGNOSIS — R1084 Generalized abdominal pain: Secondary | ICD-10-CM

## 2017-10-08 DIAGNOSIS — R197 Diarrhea, unspecified: Secondary | ICD-10-CM | POA: Diagnosis not present

## 2017-10-08 DIAGNOSIS — R112 Nausea with vomiting, unspecified: Secondary | ICD-10-CM

## 2017-10-13 IMAGING — CR DG LUMBAR SPINE 1V
1 series · 1 of 1 positions shown · non-contrast
Comparison: MR lumbar spine of 01/13/2016 and lumbar spine films of
01/02/2016

CLINICAL DATA: Laminectomy at L4-5

EXAM:
LUMBAR SPINE - 1 VIEW

[xtable lateral]
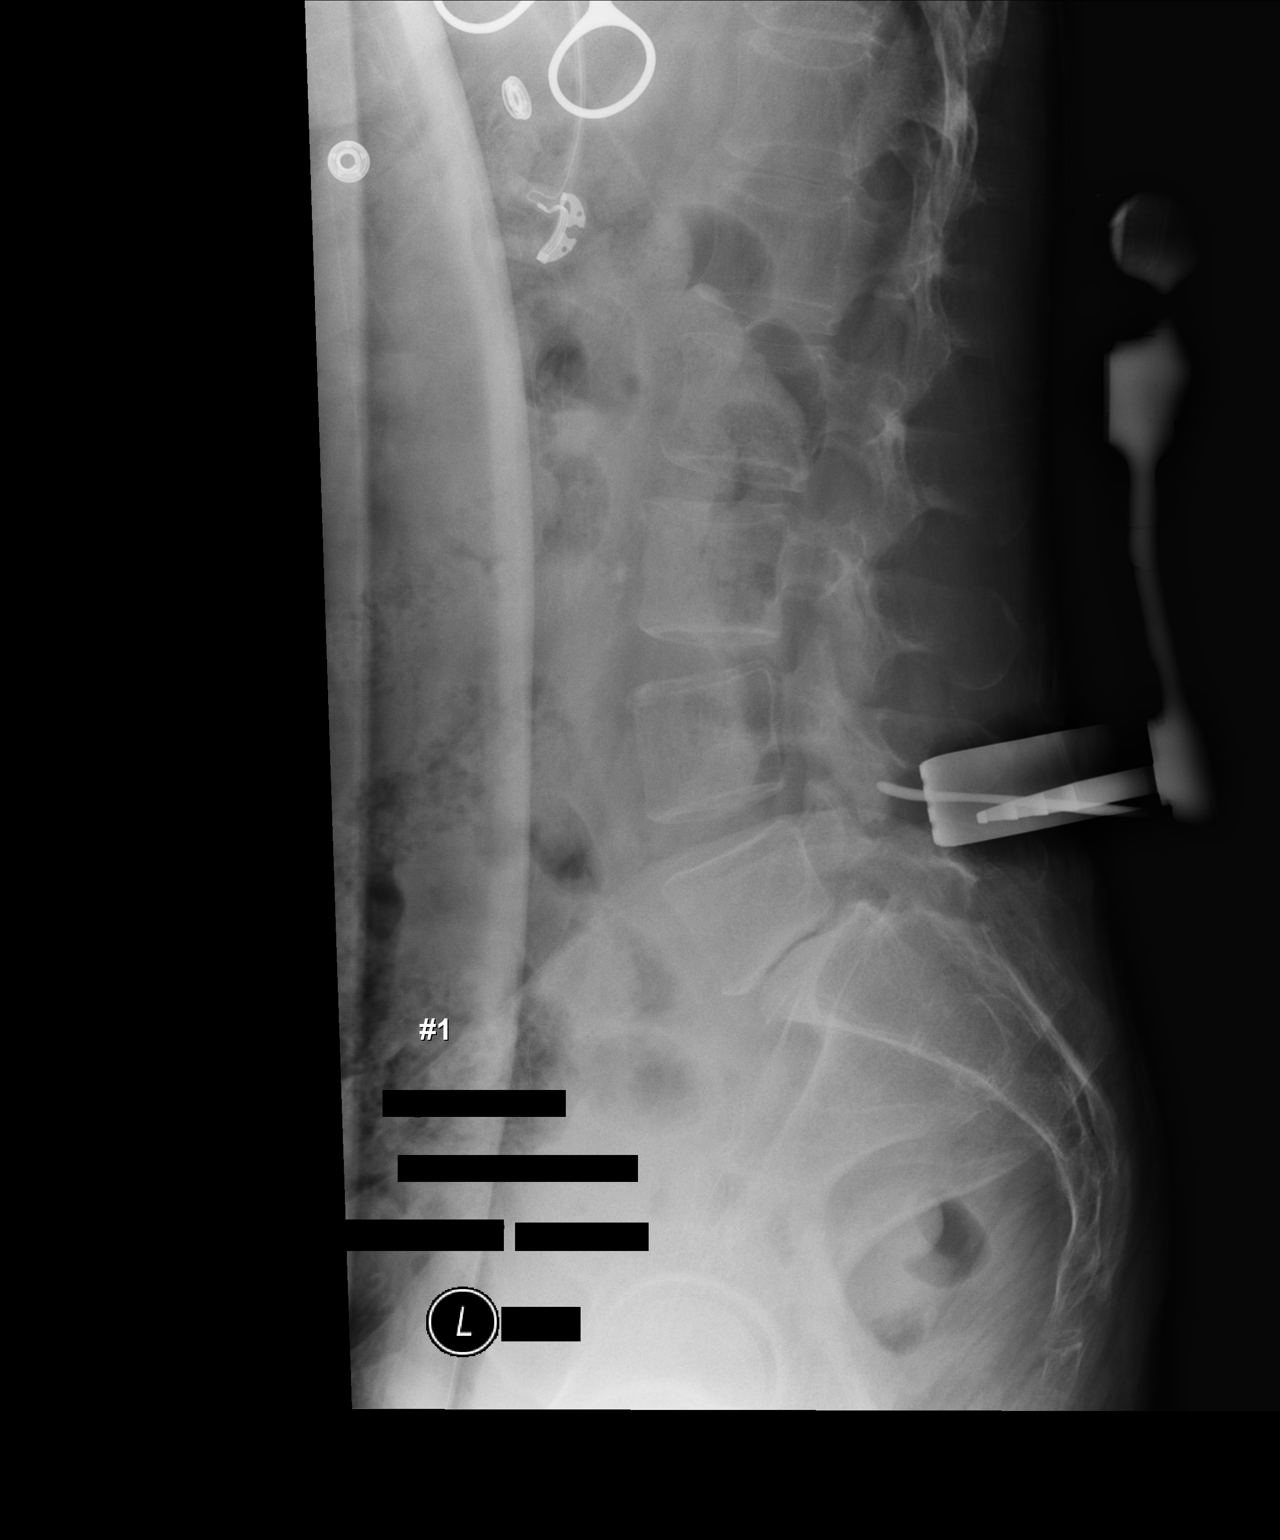

[1 of 1 positions shown; findings below may reference images not displayed]

FINDINGS: A cross-table lateral portable view of the lumbar spine was returned
from the operating room labeled #1. and instrument is noted
posteriorly beneath the spinous process of L4 directed toward the
L4-5 interspace for localization. Degenerative disc disease is
present at L5-S1 with slight anterolisthesis of L5 on S1 as noted
previously
IMPRESSION: Localization of L4-5 as noted above.

## 2017-10-26 ENCOUNTER — Ambulatory Visit (INDEPENDENT_AMBULATORY_CARE_PROVIDER_SITE_OTHER): Payer: Medicare HMO | Admitting: Family Medicine

## 2017-10-26 ENCOUNTER — Encounter: Payer: Self-pay | Admitting: Family Medicine

## 2017-10-26 VITALS — BP 118/80 | HR 97 | Temp 97.6°F | Ht 61.0 in | Wt 126.8 lb

## 2017-10-26 DIAGNOSIS — R197 Diarrhea, unspecified: Secondary | ICD-10-CM

## 2017-10-26 DIAGNOSIS — R1013 Epigastric pain: Secondary | ICD-10-CM

## 2017-10-26 DIAGNOSIS — R112 Nausea with vomiting, unspecified: Secondary | ICD-10-CM | POA: Diagnosis not present

## 2017-10-26 MED ORDER — ONDANSETRON 4 MG PO TBDP
8.0000 mg | ORAL_TABLET | Freq: Once | ORAL | Status: AC
  Administered 2017-10-26: 8 mg via ORAL

## 2017-10-26 MED ORDER — ONDANSETRON 4 MG PO TBDP: 4.0000 mg | ORAL_TABLET | Freq: Three times a day (TID) | ORAL | 0 refills | Status: DC | PRN

## 2017-10-26 MED ORDER — OMEPRAZOLE 20 MG PO CPDR: 20.0000 mg | DELAYED_RELEASE_CAPSULE | Freq: Every day | ORAL | 3 refills | Status: DC

## 2017-10-26 NOTE — Patient Instructions (Addendum)
Please go by the lab to pick up a stool specimen container- do not start omeprazole until you collect the specimen  See one of the referrals coordinators to schedule your appointment with gastroenterology  Drink 1-2 ounces of Gatorade or water every 30 minutes, when able, advance diet slowly- dry crackers, dry toast, plain potato, rice, pasta, non-both based soups. If unable to keep anything down, not urinating or vomiting won't stop- go to ER to be evaluated for dehydration.    Food Choices to Help Relieve Diarrhea, Adult When you have diarrhea, the foods you eat and your eating habits are very important. Choosing the right foods and drinks can help:  Relieve diarrhea.  Replace lost fluids and nutrients.  Prevent dehydration.  What general guidelines should I follow? Relieving diarrhea  Choose foods with less than 2 g or .07 oz. of fiber per serving.  Limit fats to less than 8 tsp (38 g or 1.34 oz.) a day.  Avoid the following: ? Foods and beverages sweetened with high-fructose corn syrup, honey, or sugar alcohols such as xylitol, sorbitol, and mannitol. ? Foods that contain a lot of fat or sugar. ? Fried, greasy, or spicy foods. ? High-fiber grains, breads, and cereals. ? Raw fruits and vegetables.  Eat foods that are rich in probiotics. These foods include dairy products such as yogurt and fermented milk products. They help increase healthy bacteria in the stomach and intestines (gastrointestinal tract, or GI tract).  If you have lactose intolerance, avoid dairy products. These may make your diarrhea worse.  Take medicine to help stop diarrhea (antidiarrheal medicine) only as told by your health care provider. Replacing nutrients  Eat small meals or snacks every 3-4 hours.  Eat bland foods, such as white rice, toast, or baked potato, until your diarrhea starts to get better. Gradually reintroduce nutrient-rich foods as tolerated or as told by your health care provider. This  includes: ? Well-cooked protein foods. ? Peeled, seeded, and soft-cooked fruits and vegetables. ? Low-fat dairy products.  Take vitamin and mineral supplements as told by your health care provider. Preventing dehydration   Start by sipping water or a special solution to prevent dehydration (oral rehydration solution, ORS). Urine that is clear or pale yellow means that you are getting enough fluid.  Try to drink at least 8-10 cups of fluid each day to help replace lost fluids.  You may add other liquids in addition to water, such as clear juice or decaffeinated sports drinks, as tolerated or as told by your health care provider.  Avoid drinks with caffeine, such as coffee, tea, or soft drinks.  Avoid alcohol. What foods are recommended? The items listed may not be a complete list. Talk with your health care provider about what dietary choices are best for you. Grains White rice. White, [place], or pita breads (fresh or toasted), including plain rolls, buns, or bagels. White pasta. Saltine, soda, or graham crackers. Pretzels. Low-fiber cereal. Cooked cereals made with water (such as cornmeal, farina, or cream cereals). Plain muffins. Matzo. Melba toast. Zwieback. Vegetables Potatoes (without the skin). Most well-cooked and canned vegetables without skins or seeds. Tender lettuce. Fruits Apple sauce. Fruits canned in juice. Cooked apricots, cherries, grapefruit, peaches, pears, or plums. Fresh bananas and cantaloupe. Meats and other protein foods Baked or boiled chicken. Eggs. Tofu. Fish. Seafood. Smooth nut butters. Ground or well-cooked tender beef, ham, veal, lamb, pork, or poultry. Dairy Plain yogurt, kefir, and unsweetened liquid yogurt. Lactose-free milk, buttermilk, skim milk, or soy  milk. Low-fat or nonfat hard cheese. Beverages Water. Low-calorie sports drinks. Fruit juices without pulp. Strained tomato and vegetable juices. Decaffeinated teas. Sugar-free beverages not sweetened  with sugar alcohols. Oral rehydration solutions, if approved by your health care provider. Seasoning and other foods Bouillon, broth, or soups made from recommended foods. What foods are not recommended? The items listed may not be a complete list. Talk with your health care provider about what dietary choices are best for you. Grains Whole grain, whole wheat, bran, or rye breads, rolls, pastas, and crackers. Wild or brown rice. Whole grain or bran cereals. Barley. Oats and oatmeal. Corn tortillas or taco shells. Granola. Popcorn. Vegetables Raw vegetables. Fried vegetables. Cabbage, broccoli, Brussels sprouts, artichokes, baked beans, beet greens, corn, kale, legumes, peas, sweet potatoes, and yams. Potato skins. Cooked spinach and cabbage. Fruits Dried fruit, including raisins and dates. Raw fruits. Stewed or dried prunes. Canned fruits with syrup. Meat and other protein foods Fried or fatty meats. Deli meats. Chunky nut butters. Nuts and seeds. Beans and lentils. Berniece Salines. Hot dogs. Sausage. Dairy High-fat cheeses. Whole milk, chocolate milk, and beverages made with milk, such as milk shakes. Half-and-half. Cream. sour cream. Ice cream. Beverages Caffeinated beverages (such as coffee, tea, soda, or energy drinks). Alcoholic beverages. Fruit juices with pulp. Prune juice. Soft drinks sweetened with high-fructose corn syrup or sugar alcohols. High-calorie sports drinks. Fats and oils Butter. Cream sauces. Margarine. Salad oils. Plain salad dressings. Olives. Avocados. Mayonnaise. Sweets and desserts Sweet rolls, doughnuts, and sweet breads. Sugar-free desserts sweetened with sugar alcohols such as xylitol and sorbitol. Seasoning and other foods Honey. Hot sauce. Chili powder. Gravy. Cream-based or milk-based soups. Pancakes and waffles. Summary  When you have diarrhea, the foods you eat and your eating habits are very important.  Make sure you get at least 8-10 cups of fluid each day, or  enough to keep your urine clear or pale yellow.  Eat bland foods and gradually reintroduce healthy, nutrient-rich foods as tolerated, or as told by your health care provider.  Avoid high-fiber, fried, greasy, or spicy foods. This information is not intended to replace advice given to you by your health care provider. Make sure you discuss any questions you have with your health care provider. Document Released: 05/10/2003 Document Revised: 02/15/2016 Document Reviewed: 02/15/2016 Elsevier Interactive Patient Education  [employer].

## 2017-10-26 NOTE — Progress Notes (Signed)
Subjective:    Patient ID: [name], female    DOB: 06-27-1965, 52 y.o.   MRN: [id number]  HPI This is a 52 yo female, accompanied by her mother, who presents today with nausea x 4 days. Has been an intermittent problem for several months, off and on.  Feels like she is going to vomit all the time. Vomited several times yesterday, can't keep anything down. Vomits her food Has run out of phenergan which seemed to help.  Having diarrhea and loose bowel movements several times a day. No recent antibiotic use. No recent blood or mucus in bowel movements.  Couldn't tolerate pepto bismol.  Smokes marijuana, not every day.  Stomach hurts, achy, only when she is nauseous.   She had negative RUQ ultrasound 10/08/17, labs 09/10/17 (normal lipase, amylase, CBC. CMET with mildly decreased sodium at 133, otherwise normal).    Past Medical History:  Diagnosis Date  . Anxiety   . Arthritis   . Asthma   . Bipolar disorder ([place])    currently feeling MANIC- 10/02/2016  . Depression   . GERD (gastroesophageal reflux disease)   . History of blood transfusion    as a newborn   . History of lump of left breast   . Hyperlipidemia   . Lumbar pseudoarthrosis   . OSA (obstructive sleep apnea) 01/09/2016   can't afford CPAP  . Personality disorder ([place])   . PONV (postoperative nausea and vomiting)   . Post traumatic stress disorder (PTSD)   . Substance abuse ([place])   . Synovial cyst    Past Surgical History:  Procedure Laterality Date  . KNEE SURGERY Left    x5, post basketball injury  . LUMBAR LAMINECTOMY/DECOMPRESSION MICRODISCECTOMY Left 01/22/2016   Procedure: Laminectomy for facet/synovial cyst - left - Lumbar four - lumbar five;  Surgeon: Earnie Larsson, MD;  Location: [facility];  Service: Neurosurgery;  Laterality: Left;  Laminectomy for facet/synovial cyst - left - Lumbar four - lumbar five  . SHOULDER SURGERY Left    x2  . TOE SURGERY Bilateral    bone spurs   Family History  Problem Relation  Age of Onset  . Heart disease Father   . Hyperlipidemia Father   . Alcohol abuse Brother   . Alcohol abuse Paternal Uncle   . Breast cancer Maternal Aunt        70's  . Colon cancer Neg Hx    Social History   Tobacco Use  . Smoking status: Former Smoker    Packs/day: 1.00    Years: 1.00    Pack years: 1.00    Types: Cigarettes  . Smokeless tobacco: Never Used  . Tobacco comment: quit smoking 2016  Substance Use Topics  . Alcohol use: No    Alcohol/week: 0.0 standard drinks    Comment: quit 20 years  . Drug use: Yes    Frequency: 14.0 times per week    Types: Marijuana    Comment: 20 yrs. ago- cocaine      Review of Systems Per HPI    Objective:   Physical Exam  Constitutional: She is oriented to person, place, and time. She appears well-developed and well-nourished. She appears ill (occasional retching.). No distress.  HENT:  Mouth/Throat: Oropharynx is clear and moist.  Eyes: Conjunctivae are normal.  Cardiovascular: Normal rate, regular rhythm and normal heart sounds.  Pulmonary/Chest: Effort normal and breath sounds normal.  Abdominal: Soft. Bowel sounds are normal. She exhibits no distension and no mass. There  is tenderness (generalized epigastric. ). There is no rebound and no guarding.  Neurological: She is alert and oriented to person, place, and time.  Skin: Skin is warm and dry. She is not diaphoretic.  Psychiatric: She has a normal mood and affect. Her behavior is normal.  Vitals reviewed.     BP 118/80 (BP Location: Left Arm, Patient Position: Sitting, Cuff Size: Normal)   Pulse 97   Temp 97.6 F (36.4 C) (Oral)   Ht 5\' 1"  (1.549 m)   Wt 126 lb 12 oz (57.5 kg)   SpO2 98%   BMI 23.95 kg/m  Wt Readings from Last 3 Encounters:  10/26/17 126 lb 12 oz (57.5 kg)  09/10/17 136 lb (61.7 kg)  06/12/17 128 lb 12 oz (58.4 kg)       Assessment & Plan:  1. Non-intractable vomiting with nausea, unspecified vomiting type - persistent problem with normal  labs, limited US, will check for H. Pylori, start on omeprazole and ondansetron and refer to GI - ondansetron (ZOFRAN-ODT) disintegrating tablet 8 mg - Ambulatory referral to Gastroenterology - ondansetron (ZOFRAN-ODT) 4 MG disintegrating tablet; Take 1-2 tablets (4-8 mg total) by mouth every 8 (eight) hours as needed for nausea or vomiting.  Dispense: 30 tablet; Refill: 0 - omeprazole (PRILOSEC) 20 MG capsule; Take 1 capsule (20 mg total) by mouth daily.  Dispense: 30 capsule; Refill: 3 - Helicobacter pylori special antigen; Future  2. Epigastric pain - ondansetron (ZOFRAN-ODT) disintegrating tablet 8 mg - Ambulatory referral to Gastroenterology - omeprazole (PRILOSEC) 20 MG capsule; Take 1 capsule (20 mg total) by mouth daily.  Dispense: 30 capsule; Refill: 3 - Helicobacter pylori special antigen; Future  3. Diarrhea, unspecified type - Ambulatory referral to Gastroenterology - Helicobacter pylori special antigen; Future  - RTC precautions reviewed, encouraged adequate hydration, bland diet  Clarene Reamer, FNP-BC  [facility], [place]  10/26/2017 5:41 PM

## 2017-10-27 ENCOUNTER — Other Ambulatory Visit: Payer: Self-pay

## 2017-10-28 ENCOUNTER — Other Ambulatory Visit: Payer: Self-pay

## 2017-10-28 ENCOUNTER — Ambulatory Visit: Payer: Medicare HMO | Admitting: Gastroenterology

## 2017-10-28 ENCOUNTER — Encounter: Payer: Self-pay | Admitting: Gastroenterology

## 2017-10-28 VITALS — BP 120/80 | HR 80 | Resp 17 | Ht 61.0 in | Wt 133.0 lb

## 2017-10-28 DIAGNOSIS — R1115 Cyclical vomiting syndrome unrelated to migraine: Secondary | ICD-10-CM

## 2017-10-28 DIAGNOSIS — R1013 Epigastric pain: Secondary | ICD-10-CM | POA: Diagnosis not present

## 2017-10-28 DIAGNOSIS — F1910 Other psychoactive substance abuse, uncomplicated: Secondary | ICD-10-CM | POA: Diagnosis not present

## 2017-10-28 DIAGNOSIS — Z1211 Encounter for screening for malignant neoplasm of colon: Secondary | ICD-10-CM | POA: Diagnosis not present

## 2017-10-28 DIAGNOSIS — G43A1 Cyclical vomiting, intractable: Secondary | ICD-10-CM

## 2017-10-28 DIAGNOSIS — R112 Nausea with vomiting, unspecified: Secondary | ICD-10-CM

## 2017-10-28 MED ORDER — PROMETHAZINE HCL 25 MG PO TABS: 25.0000 mg | ORAL_TABLET | Freq: Three times a day (TID) | ORAL | 0 refills | Status: DC | PRN

## 2017-10-28 MED ORDER — OMEPRAZOLE 20 MG PO CPDR: 20.0000 mg | DELAYED_RELEASE_CAPSULE | Freq: Two times a day (BID) | ORAL | 0 refills | Status: DC

## 2017-10-28 NOTE — Progress Notes (Signed)
Cephas Darby, MD [address]  [place]  [place], [place] [address]  Main: [phone]  Fax: [phone]    Gastroenterology Consultation  Referring Provider:     Elby Beck, FNP Primary Care Physician:  Jearld Fenton, NP Primary Gastroenterologist:  Dr. Cephas Darby Reason for Consultation:     Intractable nausea and vomiting        HPI:   [name] is a 52 y.o. female referred by Dr. Garnette Gunner, Coralie Keens, NP  for consultation & management of intractable nausea and vomiting. Patient has history of bipolar, chronic marijuana use, tobacco use, prior history of cocaine use reports more than 1 year history of episodes of nausea and emesis. She smokes marijuana every day. She reports nausea yesterday and was lying on the exam table when I saw her. She had sausage for breakfast and threw up during my encounter with her. She has been taking Zofran as needed as well as Phenergan. She ran out of Phenergan prescription. She also reports epigastric discomfort. She denies diarrhea or constipation. She is not sure if she is taking PPI. She denies melena or rectal bleeding. Most recent labs in about a month ago have been normal including CBC, CMP, lipase and right upper quadrant ultrasound She is currently on morphine for chronic back pain, takes Wellbutrin as well as gabapentin  NSAIDs: none  Antiplts/Anticoagulants/Anti thrombotics: none  GI Procedures: none She denies family history of GI malignancy  Past Medical History:  Diagnosis Date  . Anxiety   . Arthritis   . Asthma   . Bipolar disorder ([place])    currently feeling MANIC- 10/02/2016  . Depression   . GERD (gastroesophageal reflux disease)   . History of blood transfusion    as a newborn   . History of lump of left breast   . Hyperlipidemia   . Lumbar pseudoarthrosis   . OSA (obstructive sleep apnea) 01/09/2016   can't afford CPAP  . Personality disorder ([place])   . PONV (postoperative nausea and vomiting)     . Post traumatic stress disorder (PTSD)   . Substance abuse ([place])   . Synovial cyst     Past Surgical History:  Procedure Laterality Date  . KNEE SURGERY Left    x5, post basketball injury  . LUMBAR LAMINECTOMY/DECOMPRESSION MICRODISCECTOMY Left 01/22/2016   Procedure: Laminectomy for facet/synovial cyst - left - Lumbar four - lumbar five;  Surgeon: Earnie Larsson, MD;  Location: [facility];  Service: Neurosurgery;  Laterality: Left;  Laminectomy for facet/synovial cyst - left - Lumbar four - lumbar five  . SHOULDER SURGERY Left    x2  . TOE SURGERY Bilateral    bone spurs    Current Outpatient Medications:  .  albuterol (PROVENTIL HFA;VENTOLIN HFA) 108 (90 Base) MCG/ACT inhaler, Inhale 2 puffs into the lungs every 4 (four) hours as needed for wheezing or shortness of breath., Disp: 1 Inhaler, Rfl: 1 .  aspirin 81 MG tablet, Take 81 mg by mouth daily., Disp: , Rfl:  .  buPROPion (WELLBUTRIN XL) 150 MG 24 hr tablet, TAKE 1 TABLET BY MOUTH EVERY DAY IN THE MORNING, Disp: , Rfl: 2 .  buPROPion (WELLBUTRIN XL) 300 MG 24 hr tablet, bupropion HCl XL 300 mg 24 hr tablet, extended release, Disp: , Rfl:  .  carisoprodol (SOMA) 350 MG tablet, Take 350 mg by mouth every 6 (six) hours as needed., Disp: , Rfl: 0 .  gabapentin (NEURONTIN) 100 MG capsule, gabapentin  100 mg capsule, Disp: , Rfl:  .  gabapentin (NEURONTIN) 300 MG capsule, Take 1 capsule by mouth 3 (three) times daily., Disp: , Rfl:  .  HYDROcodone-acetaminophen (NORCO) 7.5-325 MG tablet, hydrocodone 7.5 mg-acetaminophen 325 mg tablet, Disp: , Rfl:  .  morphine (MS CONTIN) 15 MG 12 hr tablet, Take 1 tablet by mouth 3 (three) times daily., Disp: , Rfl:  .  ondansetron (ZOFRAN-ODT) 4 MG disintegrating tablet, Take 1-2 tablets (4-8 mg total) by mouth every 8 (eight) hours as needed for nausea or vomiting., Disp: 30 tablet, Rfl: 0 .  simvastatin (ZOCOR) 20 MG tablet, Take 1 tablet (20 mg total) by mouth daily at 6 PM., Disp: 30 tablet, Rfl: 10 .   amoxicillin (AMOXIL) 500 MG capsule, TK 1 C PO Q 8  H, Disp: , Rfl: 0 .  carisoprodol (SOMA) 250 MG tablet, Take 250 mg by mouth 4 (four) times daily., Disp: , Rfl:  .  diphenoxylate-atropine (LOMOTIL) 2.5-0.025 MG tablet, Take 1 tablet by mouth daily as needed for diarrhea or loose stools. (Patient not taking: Reported on 10/26/2017), Disp: 30 tablet, Rfl: 0 .  etodolac (LODINE) 400 MG tablet, etodolac 400 mg tablet, Disp: , Rfl:  .  ibuprofen (ADVIL,MOTRIN) 800 MG tablet, ibuprofen 800 mg tablet, Disp: , Rfl:  .  omeprazole (PRILOSEC) 20 MG capsule, Take 1 capsule (20 mg total) by mouth 2 (two) times daily before a meal., Disp: 60 capsule, Rfl: 0 .  paliperidone (INVEGA) 3 MG 24 hr tablet, TAKE 1 TABLET BY MOUTH EVERYDAY AT BEDTIME, Disp: , Rfl: 2 .  paliperidone (INVEGA) 9 MG 24 hr tablet, paliperidone ER 9 mg tablet,extended release 24 hr, Disp: , Rfl:  .  PARoxetine (PAXIL) 40 MG tablet, paroxetine 40 mg tablet, Disp: , Rfl:  .  promethazine (PHENERGAN) 25 MG tablet, Take 1 tablet (25 mg total) by mouth every 8 (eight) hours as needed for up to 14 doses for nausea or vomiting., Disp: 14 tablet, Rfl: 0   Family History  Problem Relation Age of Onset  . Heart disease Father   . Hyperlipidemia Father   . Alcohol abuse Brother   . Alcohol abuse Paternal Uncle   . Breast cancer Maternal Aunt        70's  . Colon cancer Neg Hx      Social History   Tobacco Use  . Smoking status: Former Smoker    Packs/day: 1.00    Years: 1.00    Pack years: 1.00    Types: Cigarettes  . Smokeless tobacco: Never Used  . Tobacco comment: quit smoking 2016  Substance Use Topics  . Alcohol use: No    Alcohol/week: 0.0 standard drinks    Comment: quit 20 years  . Drug use: Yes    Frequency: 14.0 times per week    Types: Marijuana    Comment: 20 yrs. ago- cocaine     Allergies as of 10/28/2017 - Review Complete 10/28/2017  Allergen Reaction Noted  . Effexor [venlafaxine] Other (See Comments)  11/21/2015  . Lamictal [lamotrigine] Rash 11/21/2015    Review of Systems:    All systems reviewed and negative except where noted in HPI.   Physical Exam:  BP 120/80 (BP Location: Left Arm, Patient Position: Sitting, Cuff Size: Normal)   Pulse 80   Resp 17   Ht 5\' 1"  (1.549 m)   Wt 133 lb (60.3 kg)   BMI 25.13 kg/m  No LMP recorded. Patient is postmenopausal.  General:  Alert,  Well-developed, well-nourished, pleasant and cooperative in NAD Head:  Normocephalic and atraumatic. Eyes:  Sclera clear, no icterus.   Conjunctiva pink. Ears:  Normal auditory acuity. Nose:  No deformity, discharge, or lesions. Mouth:  No deformity or lesions,oropharynx pink & moist. Neck:  Supple; no masses or thyromegaly. Lungs:  Respirations even and unlabored.  Clear throughout to auscultation.   No wheezes, crackles, or rhonchi. No acute distress. Heart:  Regular rate and rhythm; no murmurs, clicks, rubs, or gallops. Abdomen:  Normal bowel sounds. Soft, Mild epigastric tenderness and non-distended without masses, hepatosplenomegaly or hernias noted.  No guarding or rebound tenderness.   Rectal: Not performed Msk:  Symmetrical without gross deformities. Good, equal movement & strength bilaterally. Pulses:  Normal pulses noted. Extremities:  No clubbing or edema.  No cyanosis. Neurologic:  Alert and oriented x3;  grossly normal neurologically. Skin:  Intact without significant lesions or rashes. No jaundice. Lymph Nodes:  No significant cervical adenopathy. Psych:  Alert and cooperative. Normal mood and affect.  Imaging Studies: reviewed  Assessment and Plan:   [name] is a 52 y.o. Caucasian female with polysubstance abuse, chronic marijuana use, chronic opioid use, bipolar disorder seen in consultation for intractable nausea and vomiting. Differentials include opioid-induced gastroparesis or cannabis hyperemesis syndrome or H pyloric gastritis or peptic ulcer disease or functional  dyspepsia  - Recommend Zofran 4 mg SL every 6 hours - Refill Phenergan 25 mg every 8 hours for 15 pills only - start Prilosec 20 mg twice daily - Recommend her to try Popsicles and sour patches - Avoid solid food - encouraged to drink more liquids to prevent dehydration - if vomiting persists, recommend going to the ER - Recommend EGD for further evaluation  Colon cancer screening: overdue Recommend colonoscopy to be scheduled with EGD after her symptoms subside within next 1-2 weeks   Follow up in 4 weeks   Cephas Darby, MD

## 2017-10-29 DIAGNOSIS — R1013 Epigastric pain: Secondary | ICD-10-CM | POA: Diagnosis not present

## 2017-10-29 DIAGNOSIS — R197 Diarrhea, unspecified: Secondary | ICD-10-CM | POA: Diagnosis not present

## 2017-10-29 DIAGNOSIS — R112 Nausea with vomiting, unspecified: Secondary | ICD-10-CM | POA: Diagnosis not present

## 2017-10-29 NOTE — Addendum Note (Signed)
Addended by: Lendon Collar on: 10/29/2017 09:43 AM   Modules accepted: Orders

## 2017-10-30 ENCOUNTER — Encounter: Payer: Self-pay | Admitting: *Deleted

## 2017-10-30 LAB — HELICOBACTER PYLORI  SPECIAL ANTIGEN
MICRO NUMBER:: 91035819
SPECIMEN QUALITY: ADEQUATE

## 2017-11-03 ENCOUNTER — Encounter: Admission: RE | Payer: Self-pay | Source: Ambulatory Visit

## 2017-11-03 ENCOUNTER — Other Ambulatory Visit: Payer: Self-pay

## 2017-11-03 ENCOUNTER — Encounter: Payer: Self-pay | Admitting: *Deleted

## 2017-11-03 ENCOUNTER — Ambulatory Visit: Admission: RE | Admit: 2017-11-03 | Payer: Medicare HMO | Source: Ambulatory Visit | Admitting: Gastroenterology

## 2017-11-03 DIAGNOSIS — R112 Nausea with vomiting, unspecified: Secondary | ICD-10-CM

## 2017-11-03 DIAGNOSIS — R1013 Epigastric pain: Secondary | ICD-10-CM

## 2017-11-03 SURGERY — ESOPHAGOGASTRODUODENOSCOPY (EGD) WITH PROPOFOL
Anesthesia: General

## 2017-11-03 MED ORDER — ONDANSETRON HCL 4 MG PO TABS: 4.0000 mg | ORAL_TABLET | Freq: Three times a day (TID) | ORAL | 1 refills | Status: DC | PRN

## 2017-11-03 MED ORDER — PEG-KCL-NACL-NASULF-NA ASC-C 140 G PO SOLR: 1.0000 | Freq: Every day | ORAL | 0 refills | Status: DC

## 2017-11-04 ENCOUNTER — Ambulatory Visit
Admission: RE | Admit: 2017-11-04 | Discharge: 2017-11-04 | Disposition: A | Discharge status: Home / Self Care | Payer: Medicare HMO | Source: Ambulatory Visit | Attending: Gastroenterology | Admitting: Gastroenterology

## 2017-11-04 ENCOUNTER — Ambulatory Visit: Payer: Medicare HMO | Admitting: Anesthesiology

## 2017-11-04 ENCOUNTER — Encounter
Admission: RE | Disposition: A | Discharge status: Home / Self Care | Payer: Self-pay | Source: Ambulatory Visit | Attending: Gastroenterology

## 2017-11-04 DIAGNOSIS — F419 Anxiety disorder, unspecified: Secondary | ICD-10-CM | POA: Diagnosis not present

## 2017-11-04 DIAGNOSIS — Z888 Allergy status to other drugs, medicaments and biological substances status: Secondary | ICD-10-CM | POA: Insufficient documentation

## 2017-11-04 DIAGNOSIS — K449 Diaphragmatic hernia without obstruction or gangrene: Secondary | ICD-10-CM | POA: Diagnosis not present

## 2017-11-04 DIAGNOSIS — D124 Benign neoplasm of descending colon: Secondary | ICD-10-CM | POA: Insufficient documentation

## 2017-11-04 DIAGNOSIS — K5710 Diverticulosis of small intestine without perforation or abscess without bleeding: Secondary | ICD-10-CM | POA: Diagnosis not present

## 2017-11-04 DIAGNOSIS — M19011 Primary osteoarthritis, right shoulder: Secondary | ICD-10-CM | POA: Diagnosis not present

## 2017-11-04 DIAGNOSIS — Z87891 Personal history of nicotine dependence: Secondary | ICD-10-CM | POA: Diagnosis not present

## 2017-11-04 DIAGNOSIS — D126 Benign neoplasm of colon, unspecified: Secondary | ICD-10-CM | POA: Diagnosis not present

## 2017-11-04 DIAGNOSIS — Z79899 Other long term (current) drug therapy: Secondary | ICD-10-CM | POA: Diagnosis not present

## 2017-11-04 DIAGNOSIS — M19012 Primary osteoarthritis, left shoulder: Secondary | ICD-10-CM | POA: Diagnosis not present

## 2017-11-04 DIAGNOSIS — M170 Bilateral primary osteoarthritis of knee: Secondary | ICD-10-CM | POA: Diagnosis not present

## 2017-11-04 DIAGNOSIS — R1110 Vomiting, unspecified: Secondary | ICD-10-CM

## 2017-11-04 DIAGNOSIS — R197 Diarrhea, unspecified: Secondary | ICD-10-CM | POA: Diagnosis not present

## 2017-11-04 DIAGNOSIS — Z1211 Encounter for screening for malignant neoplasm of colon: Secondary | ICD-10-CM | POA: Insufficient documentation

## 2017-11-04 DIAGNOSIS — F319 Bipolar disorder, unspecified: Secondary | ICD-10-CM | POA: Diagnosis not present

## 2017-11-04 DIAGNOSIS — J45909 Unspecified asthma, uncomplicated: Secondary | ICD-10-CM | POA: Insufficient documentation

## 2017-11-04 DIAGNOSIS — R112 Nausea with vomiting, unspecified: Secondary | ICD-10-CM | POA: Diagnosis not present

## 2017-11-04 DIAGNOSIS — M47812 Spondylosis without myelopathy or radiculopathy, cervical region: Secondary | ICD-10-CM | POA: Diagnosis not present

## 2017-11-04 DIAGNOSIS — Z8249 Family history of ischemic heart disease and other diseases of the circulatory system: Secondary | ICD-10-CM | POA: Diagnosis not present

## 2017-11-04 DIAGNOSIS — E785 Hyperlipidemia, unspecified: Secondary | ICD-10-CM | POA: Diagnosis not present

## 2017-11-04 DIAGNOSIS — Z7982 Long term (current) use of aspirin: Secondary | ICD-10-CM | POA: Insufficient documentation

## 2017-11-04 DIAGNOSIS — K319 Disease of stomach and duodenum, unspecified: Secondary | ICD-10-CM | POA: Diagnosis not present

## 2017-11-04 DIAGNOSIS — G4733 Obstructive sleep apnea (adult) (pediatric): Secondary | ICD-10-CM | POA: Insufficient documentation

## 2017-11-04 DIAGNOSIS — K219 Gastro-esophageal reflux disease without esophagitis: Secondary | ICD-10-CM | POA: Insufficient documentation

## 2017-11-04 DIAGNOSIS — R1013 Epigastric pain: Secondary | ICD-10-CM

## 2017-11-04 DIAGNOSIS — F4310 Post-traumatic stress disorder, unspecified: Secondary | ICD-10-CM | POA: Insufficient documentation

## 2017-11-04 DIAGNOSIS — D125 Benign neoplasm of sigmoid colon: Secondary | ICD-10-CM | POA: Diagnosis not present

## 2017-11-04 HISTORY — PX: ESOPHAGOGASTRODUODENOSCOPY (EGD) WITH PROPOFOL: SHX5813

## 2017-11-04 HISTORY — PX: COLONOSCOPY WITH PROPOFOL: SHX5780

## 2017-11-04 HISTORY — PX: POLYPECTOMY: SHX149

## 2017-11-04 HISTORY — DX: Motion sickness, initial encounter: T75.3XXA

## 2017-11-04 SURGERY — COLONOSCOPY WITH PROPOFOL
Anesthesia: General | Wound class: Contaminated

## 2017-11-04 MED ORDER — LIDOCAINE HCL (CARDIAC) PF 100 MG/5ML IV SOSY
PREFILLED_SYRINGE | INTRAVENOUS | Status: DC | PRN
  Administered 2017-11-04: 40 mg via INTRAVENOUS

## 2017-11-04 MED ORDER — PROPOFOL 10 MG/ML IV BOLUS
INTRAVENOUS | Status: DC | PRN
  Administered 2017-11-04: 40 mg via INTRAVENOUS
  Administered 2017-11-04 (×2): 30 mg via INTRAVENOUS
  Administered 2017-11-04: 20 mg via INTRAVENOUS
  Administered 2017-11-04: 10 mg via INTRAVENOUS
  Administered 2017-11-04 (×3): 20 mg via INTRAVENOUS
  Administered 2017-11-04 (×3): 30 mg via INTRAVENOUS
  Administered 2017-11-04 (×2): 20 mg via INTRAVENOUS
  Administered 2017-11-04: 30 mg via INTRAVENOUS
  Administered 2017-11-04 (×2): 20 mg via INTRAVENOUS
  Administered 2017-11-04: 100 mg via INTRAVENOUS
  Administered 2017-11-04: 20 mg via INTRAVENOUS
  Administered 2017-11-04: 10 mg via INTRAVENOUS

## 2017-11-04 MED ORDER — SODIUM CHLORIDE 0.9 % IV SOLN: INTRAVENOUS | Status: DC

## 2017-11-04 MED ORDER — DEXMEDETOMIDINE HCL 200 MCG/2ML IV SOLN
INTRAVENOUS | Status: DC | PRN
  Administered 2017-11-04: 8 ug via INTRAVENOUS

## 2017-11-04 MED ORDER — GLYCOPYRROLATE 0.2 MG/ML IJ SOLN
INTRAMUSCULAR | Status: DC | PRN
  Administered 2017-11-04: 0.2 mg via INTRAVENOUS

## 2017-11-04 MED ORDER — LACTATED RINGERS IV SOLN
INTRAVENOUS | Status: DC
  Administered 2017-11-04: 09:00:00 via INTRAVENOUS

## 2017-11-04 SURGICAL SUPPLY — 13 items
BLOCK BITE 60FR ADLT L/F GRN (MISCELLANEOUS) ×2 IMPLANT
CANISTER SUCT 1200ML W/VALVE (MISCELLANEOUS) ×2 IMPLANT
COLOWRAP LRG (MISCELLANEOUS) ×2
COMPRESSION COLOWRAP LRG (MISCELLANEOUS) ×1 IMPLANT
ELECT REM PT RETURN 9FT ADLT (ELECTROSURGICAL)
ELECTRODE REM PT RTRN 9FT ADLT (ELECTROSURGICAL) IMPLANT
FORCEPS BIOP RAD 4 LRG CAP 4 (CUTTING FORCEPS) ×2 IMPLANT
GOWN CVR UNV OPN BCK APRN NK (MISCELLANEOUS) ×2 IMPLANT
GOWN ISOL THUMB LOOP REG UNIV (MISCELLANEOUS) ×2
KIT ENDO PROCEDURE OLY (KITS) ×2 IMPLANT
SNARE COLD EXACTO (MISCELLANEOUS) ×2 IMPLANT
TRAP ETRAP POLY (MISCELLANEOUS) ×2 IMPLANT
WATER STERILE IRR 250ML POUR (IV SOLUTION) ×2 IMPLANT

## 2017-11-04 NOTE — Anesthesia Procedure Notes (Signed)
Procedure Name: MAC Date/Time: 11/04/2017 8:52 AM Performed by: Janna Arch, CRNA Pre-anesthesia Checklist: Patient identified, Emergency Drugs available, Suction available and Patient being monitored Patient Re-evaluated:Patient Re-evaluated prior to induction Oxygen Delivery Method: Nasal cannula

## 2017-11-04 NOTE — Op Note (Signed)
[facility] Gastroenterology Patient Name: [name] Procedure Date: 11/04/2017 8:47 AM MRN: [id number] Account #: [id number] Date of Birth: March 30, 1965 Admit Type: Outpatient Age: 52 Room: [facility] OR ROOM 01 Gender: Female Note Status: Finalized Procedure:            Upper GI endoscopy Indications:          Nausea with vomiting Providers:            Lin Landsman MD, MD Referring MD:         Jearld Fenton (Referring MD) Medicines:            Monitored Anesthesia Care Complications:        No immediate complications. Estimated blood loss: None. Procedure:            Pre-Anesthesia Assessment:                       - Prior to the procedure, a History and Physical was                        performed, and patient medications and allergies were                        reviewed. The patient is competent. The risks and                        benefits of the procedure and the sedation options and                        risks were discussed with the patient. All questions                        were answered and informed consent was obtained.                        Patient identification and proposed procedure were                        verified by the physician, the nurse, the                        anesthesiologist, the anesthetist and the technician in                        the pre-procedure area in the procedure room in the                        endoscopy suite. Mental Status Examination: alert and                        oriented. Airway Examination: normal oropharyngeal                        airway and neck mobility. Respiratory Examination:                        clear to auscultation. CV Examination: normal.                        Prophylactic Antibiotics: The patient does not require  prophylactic antibiotics. Prior Anticoagulants: The                        patient has taken no previous anticoagulant or   antiplatelet agents. ASA Grade Assessment: II - A                        patient with mild systemic disease. After reviewing the                        risks and benefits, the patient was deemed in                        satisfactory condition to undergo the procedure. The                        anesthesia plan was to use monitored anesthesia care                        (MAC). Immediately prior to administration of                        medications, the patient was re-assessed for adequacy                        to receive sedatives. The heart rate, respiratory rate,                        oxygen saturations, blood pressure, adequacy of                        pulmonary ventilation, and response to care were                        monitored throughout the procedure. The physical status                        of the patient was re-assessed after the procedure.                       After obtaining informed consent, the endoscope was                        passed under direct vision. Throughout the procedure,                        the patient's blood pressure, pulse, and oxygen                        saturations were monitored continuously. The was                        introduced through the mouth, and advanced to the                        second part of duodenum. The upper GI endoscopy was                        accomplished without difficulty. The patient tolerated  the procedure fairly well. Findings:      A large non-bleeding diverticulum was found in the second portion of the       duodenum.      Normal mucosa was found in the duodenal bulb and in the second portion       of the duodenum. Biopsies were taken with a cold forceps for histology.      The entire examined stomach was normal. Biopsies were taken with a cold       forceps for Helicobacter pylori testing.      A small hiatal hernia was present.      The gastroesophageal junction and examined esophagus  were normal. Impression:           - Non-bleeding duodenal diverticulum.                       - Normal mucosa was found in the duodenal bulb and in                        the second portion of the duodenum. Biopsied.                       - Normal stomach. Biopsied.                       - Small hiatal hernia.                       - Normal gastroesophageal junction and esophagus. Recommendation:       - Await pathology results.                       - Proceed with colonoscopy as scheduled                       - See colonoscopy report Procedure Code(s):    --- Professional ---                       [phone], Esophagogastroduodenoscopy, flexible, transoral;                        with biopsy, single or multiple Diagnosis Code(s):    --- Professional ---                       K44.9, Diaphragmatic hernia without obstruction or                        gangrene                       R11.2, Nausea with vomiting, unspecified                       K57.10, Diverticulosis of small intestine without                        perforation or abscess without bleeding CPT copyright 2017 American Medical Association. All rights reserved. The codes documented in this report are preliminary and upon coder review may  be revised to meet current compliance requirements. Dr. Ulyess Mort Lin Landsman MD, MD 11/04/2017 9:09:07 AM This report has been signed electronically. Number of Addenda: 0 Note Initiated On:  11/04/2017 8:47 AM      [facility]

## 2017-11-04 NOTE — H&P (Signed)
Cephas Darby, MD [address]  [place]  [place], [place] [address]  Main: [phone]  Fax: [phone] Pager: [phone]  Primary Care Physician:  Jearld Fenton, NP Primary Gastroenterologist:  Dr. Cephas Darby  Pre-Procedure History & Physical: HPI:  [name] is a 52 y.o. female is here for an endoscopy and colonoscopy.   Past Medical History:  Diagnosis Date  . Anxiety   . Arthritis    neck, knees, shoulders  . Asthma   . Bipolar disorder ([place])    currently feeling MANIC- 10/02/2016  . Depression   . GERD (gastroesophageal reflux disease)   . History of blood transfusion    as a newborn   . History of lump of left breast   . Hyperlipidemia   . Lumbar pseudoarthrosis   . Motion sickness    cars  . OSA (obstructive sleep apnea) 01/09/2016   can't afford CPAP  . Personality disorder ([place])   . PONV (postoperative nausea and vomiting)   . Post traumatic stress disorder (PTSD)   . Substance abuse ([place])   . Synovial cyst     Past Surgical History:  Procedure Laterality Date  . KNEE SURGERY Left    x5, post basketball injury  . LUMBAR LAMINECTOMY/DECOMPRESSION MICRODISCECTOMY Left 01/22/2016   Procedure: Laminectomy for facet/synovial cyst - left - Lumbar four - lumbar five;  Surgeon: Earnie Larsson, MD;  Location: [facility];  Service: Neurosurgery;  Laterality: Left;  Laminectomy for facet/synovial cyst - left - Lumbar four - lumbar five  . SHOULDER SURGERY Left    x2  . TOE SURGERY Bilateral    bone spurs    Prior to Admission medications   Medication Sig Start Date End Date Taking? Authorizing Provider  aspirin 81 MG tablet Take 81 mg by mouth daily.   Yes [provider]  carisoprodol (SOMA) 350 MG tablet Take 350 mg by mouth every 6 (six) hours as needed. 10/20/17  Yes [provider]  diphenoxylate-atropine (LOMOTIL) 2.5-0.025 MG tablet Take 1 tablet by mouth daily as needed for diarrhea or loose stools. 09/10/17  Yes Baity, Coralie Keens, NP  gabapentin (NEURONTIN) 300 MG capsule Take 1 capsule by mouth 3 (three) times daily. 02/25/17  Yes [provider]  morphine (MS CONTIN) 15 MG 12 hr tablet Take 1 tablet by mouth 3 (three) times daily. 03/12/17  Yes [provider]  omeprazole (PRILOSEC) 20 MG capsule Take 1 capsule (20 mg total) by mouth 2 (two) times daily before a meal. 10/28/17 11/27/17 Yes Vanga, Tally Due, MD  ondansetron (ZOFRAN) 4 MG tablet Take 1 tablet (4 mg total) by mouth every 8 (eight) hours as needed for nausea or vomiting (1-2 tablets prior to each colon prep). 11/03/17  Yes Vanga, Tally Due, MD  PEG-KCl-NaCl-NaSulf-Na Asc-C (PLENVU) 140 g SOLR Take 1 kit by mouth daily. 11/03/17  Yes Vanga, Tally Due, MD  promethazine (PHENERGAN) 25 MG tablet Take 1 tablet (25 mg total) by mouth every 8 (eight) hours as needed for up to 14 doses for nausea or vomiting. 10/28/17  Yes Vanga, Tally Due, MD  simvastatin (ZOCOR) 20 MG tablet Take 1 tablet (20 mg total) by mouth daily at 6 PM. 06/08/17  Yes Baity, Coralie Keens, NP  albuterol (PROVENTIL HFA;VENTOLIN HFA) 108 (90 Base) MCG/ACT inhaler Inhale 2 puffs into the lungs every 4 (four) hours as needed for wheezing or shortness of breath. Patient not taking: Reported on 11/03/2017 12/18/15   Webb Silversmith  W, NP  buPROPion (WELLBUTRIN XL) 150 MG 24 hr tablet TAKE 1 TABLET BY MOUTH EVERY DAY IN THE MORNING 09/02/17   [provider]  HYDROcodone-acetaminophen (NORCO) 7.5-325 MG tablet hydrocodone 7.5 mg-acetaminophen 325 mg tablet    [provider]  ibuprofen (ADVIL,MOTRIN) 800 MG tablet ibuprofen 800 mg tablet    [provider]  paliperidone (INVEGA) 3 MG 24 hr tablet TAKE 1 TABLET BY MOUTH EVERYDAY AT BEDTIME 09/02/17   [provider]    Allergies as of 11/03/2017 - Review Complete 11/03/2017  Allergen Reaction Noted  . Effexor [venlafaxine] Other (See Comments) 11/21/2015  . Lamictal [lamotrigine] Rash 11/21/2015     Family History  Problem Relation Age of Onset  . Heart disease Father   . Hyperlipidemia Father   . Alcohol abuse Brother   . Alcohol abuse Paternal Uncle   . Breast cancer Maternal Aunt        70's  . Colon cancer Neg Hx     Social History   Socioeconomic History  . Marital status: Single    Spouse name: Not on file  . Number of children: Not on file  . Years of education: Not on file  . Highest education level: Not on file  Occupational History  . Not on file  Social Needs  . Financial resource strain: Not on file  . Food insecurity:    Worry: Not on file    Inability: Not on file  . Transportation needs:    Medical: Not on file    Non-medical: Not on file  Tobacco Use  . Smoking status: Former Smoker    Packs/day: 1.00    Years: 1.00    Pack years: 1.00    Types: Cigarettes  . Smokeless tobacco: Never Used  . Tobacco comment: quit smoking 2016  Substance and Sexual Activity  . Alcohol use: No    Alcohol/week: 0.0 standard drinks    Comment: quit 20 years  . Drug use: Yes    Frequency: 14.0 times per week    Types: Marijuana    Comment: 20 yrs. ago- cocaine   . Sexual activity: Not Currently  Lifestyle  . Physical activity:    Days per week: Not on file    Minutes per session: Not on file  . Stress: Not on file  Relationships  . Social connections:    Talks on phone: Not on file    Gets together: Not on file    Attends religious service: Not on file    Active member of club or organization: Not on file    Attends meetings of clubs or organizations: Not on file    Relationship status: Not on file  . Intimate partner violence:    Fear of current or ex partner: Not on file    Emotionally abused: Not on file    Physically abused: Not on file    Forced sexual activity: Not on file  Other Topics Concern  . Not on file  Social History Narrative  . Not on file    Review of Systems: See HPI, otherwise negative ROS  Physical Exam: BP 109/69    Pulse 71   Temp (!) 97.5 F (36.4 C) (Temporal)   Resp 16   Ht 5' 1"  (1.549 m)   Wt 60.8 kg   SpO2 98%   BMI 25.32 kg/m  General:   Alert,  pleasant and cooperative in NAD Head:  Normocephalic and atraumatic. Neck:  Supple; no masses  or thyromegaly. Lungs:  Clear throughout to auscultation.    Heart:  Regular rate and rhythm. Abdomen:  Soft, nontender and nondistended. Normal bowel sounds, without guarding, and without rebound.   Neurologic:  Alert and  oriented x4;  grossly normal neurologically.  Impression/Plan: [name] is here for an endoscopy and colonoscopy to be performed for nausea, vomiting and colon cancer screening  Risks, benefits, limitations, and alternatives regarding  endoscopy and colonoscopy have been reviewed with the patient.  Questions have been answered.  All parties agreeable.   Sherri Sear, MD  11/04/2017, 8:13 AM

## 2017-11-04 NOTE — Anesthesia Postprocedure Evaluation (Signed)
Anesthesia Post Note  Patient: [name]  Procedure(s) Performed: COLONOSCOPY WITH PROPOFOL (N/A ) ESOPHAGOGASTRODUODENOSCOPY (EGD) WITH PROPOFOL with biopsies (N/A ) POLYPECTOMY INTESTINAL (N/A )  Patient location during evaluation: PACU Anesthesia Type: General Level of consciousness: awake and alert Pain management: pain level controlled Vital Signs Assessment: post-procedure vital signs reviewed and stable Respiratory status: spontaneous breathing, nonlabored ventilation, respiratory function stable and patient connected to nasal cannula oxygen Cardiovascular status: blood pressure returned to baseline and stable Postop Assessment: no apparent nausea or vomiting Anesthetic complications: no    Alisa Graff

## 2017-11-04 NOTE — Transfer of Care (Signed)
Immediate Anesthesia Transfer of Care Note  Patient: [name]  Procedure(s) Performed: COLONOSCOPY WITH PROPOFOL (N/A ) ESOPHAGOGASTRODUODENOSCOPY (EGD) WITH PROPOFOL with biopsies (N/A ) POLYPECTOMY INTESTINAL (N/A )  Patient Location: PACU  Anesthesia Type: General  Level of Consciousness: awake, alert  and patient cooperative  Airway and Oxygen Therapy: Patient Spontanous Breathing and Patient connected to supplemental oxygen  Post-op Assessment: Post-op Vital signs reviewed, Patient's Cardiovascular Status Stable, Respiratory Function Stable, Patent Airway and No signs of Nausea or vomiting  Post-op Vital Signs: Reviewed and stable  Complications: No apparent anesthesia complications

## 2017-11-04 NOTE — Anesthesia Preprocedure Evaluation (Signed)
Anesthesia Evaluation  Patient identified by MRN, date of birth, ID band Patient awake    Reviewed: Allergy & Precautions, H&P , NPO status , Patient's Chart, lab work & pertinent test results, reviewed documented beta blocker date and time   History of Anesthesia Complications (+) PONV and history of anesthetic complications  Airway Mallampati: II  TM Distance: >3 FB Neck ROM: full    Dental no notable dental hx.    Pulmonary neg pulmonary ROS, asthma , sleep apnea , former smoker,    Pulmonary exam normal breath sounds clear to auscultation       Cardiovascular Exercise Tolerance: Good negative cardio ROS   Rhythm:regular Rate:Normal     Neuro/Psych PSYCHIATRIC DISORDERS Anxiety Depression Bipolar Disorder negative neurological ROS  negative psych ROS   GI/Hepatic negative GI ROS, Neg liver ROS,   Endo/Other  negative endocrine ROS  Renal/GU negative Renal ROS  negative genitourinary   Musculoskeletal   Abdominal   Peds  Hematology negative hematology ROS (+)   Anesthesia Other Findings   Reproductive/Obstetrics negative OB ROS                             Anesthesia Physical Anesthesia Plan  ASA: II  Anesthesia Plan: General   Post-op Pain Management:    Induction:   PONV Risk Score and Plan:   Airway Management Planned:   Additional Equipment:   Intra-op Plan:   Post-operative Plan:   Informed Consent: I have reviewed the patients History and Physical, chart, labs and discussed the procedure including the risks, benefits and alternatives for the proposed anesthesia with the patient or authorized representative who has indicated his/her understanding and acceptance.   Dental Advisory Given  Plan Discussed with: CRNA  Anesthesia Plan Comments:         Anesthesia Quick Evaluation

## 2017-11-04 NOTE — Op Note (Signed)
[facility] Gastroenterology Patient Name: [name] Procedure Date: 11/04/2017 8:46 AM MRN: [id number] Account #: [id number] Date of Birth: 01-07-1966 Admit Type: Outpatient Age: 51 Room: [facility] OR ROOM 01 Gender: Female Note Status: Finalized Procedure:            Colonoscopy Indications:          Screening for colorectal malignant neoplasm, This is                        the patient's first colonoscopy Providers:            Lin Landsman MD, MD Medicines:            Monitored Anesthesia Care Complications:        No immediate complications. Estimated blood loss: None. Procedure:            Pre-Anesthesia Assessment:                       - Prior to the procedure, a History and Physical was                        performed, and patient medications and allergies were                        reviewed. The patient is competent. The risks and                        benefits of the procedure and the sedation options and                        risks were discussed with the patient. All questions                        were answered and informed consent was obtained.                        Patient identification and proposed procedure were                        verified by the physician, the nurse, the                        anesthesiologist, the anesthetist and the technician in                        the pre-procedure area in the procedure room in the                        endoscopy suite. Mental Status Examination: alert and                        oriented. Airway Examination: normal oropharyngeal                        airway and neck mobility. Respiratory Examination:                        clear to auscultation. CV Examination: normal.  Prophylactic Antibiotics: The patient does not require                        prophylactic antibiotics. Prior Anticoagulants: The                        patient has taken no previous anticoagulant or                         antiplatelet agents. ASA Grade Assessment: II - A                        patient with mild systemic disease. After reviewing the                        risks and benefits, the patient was deemed in                        satisfactory condition to undergo the procedure. The                        anesthesia plan was to use monitored anesthesia care                        (MAC). Immediately prior to administration of                        medications, the patient was re-assessed for adequacy                        to receive sedatives. The heart rate, respiratory rate,                        oxygen saturations, blood pressure, adequacy of                        pulmonary ventilation, and response to care were                        monitored throughout the procedure. The physical status                        of the patient was re-assessed after the procedure.                       After obtaining informed consent, the colonoscope was                        passed under direct vision. Throughout the procedure,                        the patient's blood pressure, pulse, and oxygen                        saturations were monitored continuously. The was                        introduced through the anus and advanced to the the  cecum, identified by appendiceal orifice and ileocecal                        valve. The colonoscopy was performed without                        difficulty. The patient tolerated the procedure well.                        The quality of the bowel preparation was evaluated                        using the BBPS [facility] Bowel Preparation Scale) with                        scores of: Right Colon = 3, Transverse Colon = 3 and                        Left Colon = 3 (entire mucosa seen well with no                        residual staining, small fragments of stool or opaque                        liquid). The total BBPS score equals  9. Findings:      The perianal and digital rectal examinations were normal. Pertinent       negatives include normal sphincter tone and no palpable rectal lesions.      A 5 mm polyp was found in the descending colon. The polyp was sessile.       The polyp was removed with a cold snare. Resection and retrieval were       complete.      A diminutive polyp was found in the sigmoid colon. The polyp was       sessile. The polyp was removed with a cold snare. Resection was       complete, but the polyp tissue was not retrieved.      Unabe to perfrom retroflexion due to short rectum and pt not hoding air Impression:           - One 5 mm polyp in the descending colon, removed with                        a cold snare. Resected and retrieved.                       - One diminutive polyp in the sigmoid colon, removed                        with a cold snare. Complete resection. Polyp tissue not                        retrieved. Recommendation:       - Discharge patient to home (with escort).                       - Resume previous diet today.                       -  Continue present medications.                       - Await pathology results.                       - Repeat colonoscopy in 5 years for surveillance.                       - Return to my office as previously scheduled. Procedure Code(s):    --- Professional ---                       [phone], Colonoscopy, flexible; with removal of tumor(s),                        polyp(s), or other lesion(s) by snare technique Diagnosis Code(s):    --- Professional ---                       Z12.11, Encounter for screening for malignant neoplasm                        of colon                       D12.4, Benign neoplasm of descending colon                       D12.5, Benign neoplasm of sigmoid colon CPT copyright 2017 American Medical Association. All rights reserved. The codes documented in this report are preliminary and upon coder review may  be  revised to meet current compliance requirements. Dr. Ulyess Mort Lin Landsman MD, MD 11/04/2017 9:38:23 AM This report has been signed electronically. Number of Addenda: 0 Note Initiated On: 11/04/2017 8:46 AM Scope Withdrawal Time: 0 hours 20 minutes 1 second  Total Procedure Duration: 0 hours 22 minutes 28 seconds       [facility]

## 2017-11-05 ENCOUNTER — Encounter: Payer: Self-pay | Admitting: Gastroenterology

## 2017-11-05 DIAGNOSIS — M4317 Spondylolisthesis, lumbosacral region: Secondary | ICD-10-CM | POA: Diagnosis not present

## 2017-11-05 DIAGNOSIS — Z6825 Body mass index (BMI) 25.0-25.9, adult: Secondary | ICD-10-CM | POA: Diagnosis not present

## 2017-11-05 DIAGNOSIS — R030 Elevated blood-pressure reading, without diagnosis of hypertension: Secondary | ICD-10-CM | POA: Diagnosis not present

## 2017-11-06 ENCOUNTER — Encounter: Payer: Self-pay | Admitting: Gastroenterology

## 2017-11-10 ENCOUNTER — Telehealth: Payer: Self-pay | Admitting: Internal Medicine

## 2017-11-10 NOTE — Telephone Encounter (Signed)
Copied from [place] [phone]. Topic: Quick Communication - Rx Refill/Question >> Nov 10, 2017  2:11 PM Mylinda Latina, NT wrote: Medication: ondansetron (ZOFRAN) 4 MG tablet  Has the patient contacted their pharmacy? No. (Agent: If no, request that the patient contact the pharmacy for the refill.) (Agent: If yes, when and what did the pharmacy advise?)  Preferred Pharmacy (with phone number or street name): [id number] - [place], [place] [phone] (Phone) [phone] (Fax)  Patient states she needs more than 4 tablets to last her. SHe has a refill at the pharmacy , she states she is going to call in but she needs more than 4   Agent: Please be advised that RX refills may take up to 3 business days. We ask that you follow-up with your pharmacy.

## 2017-11-10 NOTE — Telephone Encounter (Signed)
She pt. Request for Zofran. Dr. Marius Ditch provided last refill. Please advise pt.

## 2017-11-10 NOTE — Telephone Encounter (Signed)
Pt has an appt with dr Marius Ditch on 12-21-17

## 2017-11-11 NOTE — Telephone Encounter (Signed)
Dr Marius Ditch has already refilled medication with 1 refill  Pt reports she had been on previously from [facility] and you filled the Phenergan in the past... Pt states she needs more than 4 pills as she does not see Dr Marius Ditch for another month  Please advise

## 2017-11-11 NOTE — Telephone Encounter (Signed)
She was seen in ER. She needs ER follow up to discuss.

## 2017-11-12 ENCOUNTER — Ambulatory Visit (INDEPENDENT_AMBULATORY_CARE_PROVIDER_SITE_OTHER): Payer: Medicare HMO | Admitting: Internal Medicine

## 2017-11-12 ENCOUNTER — Encounter: Payer: Self-pay | Admitting: Internal Medicine

## 2017-11-12 VITALS — BP 130/84 | HR 82 | Temp 98.3°F | Wt 132.0 lb

## 2017-11-12 DIAGNOSIS — R110 Nausea: Secondary | ICD-10-CM

## 2017-11-12 DIAGNOSIS — R197 Diarrhea, unspecified: Secondary | ICD-10-CM | POA: Diagnosis not present

## 2017-11-12 MED ORDER — ONDANSETRON HCL 4 MG PO TABS: 4.0000 mg | ORAL_TABLET | Freq: Three times a day (TID) | ORAL | 1 refills | Status: DC | PRN

## 2017-11-12 MED ORDER — ALBUTEROL SULFATE HFA 108 (90 BASE) MCG/ACT IN AERS: 2.0000 | INHALATION_SPRAY | RESPIRATORY_TRACT | 1 refills | Status: DC | PRN

## 2017-11-12 NOTE — Telephone Encounter (Signed)
Pt has appt for 12:15 today

## 2017-11-12 NOTE — Patient Instructions (Signed)
Nausea, Adult Feeling sick to your stomach (nausea) means that your stomach is upset or you feel like you have to throw up (vomit). Feeling sick to your stomach is usually not serious, but it may be an early sign of a more serious medical problem. As you feel sicker to your stomach, it can lead to throwing up (vomiting). If you throw up, or if you are not able to drink enough fluids, there is a risk of dehydration. Dehydration can make you feel tired and thirsty, have a dry mouth, and pee (urinate) less often. Older adults and people who have other diseases or a weak defense (immune) system have a higher risk of dehydration. The main goal of treating this condition is to:  Limit how often you feel sick to your stomach.  Prevent throwing up and dehydration.  Follow these instructions at home: Follow instructions from your doctor about how to care for yourself at home. Eating and drinking Follow these recommendations as told by your doctor:  Take an oral rehydration solution (ORS). This is a drink that is sold at pharmacies and stores.  Drink clear fluids in small amounts as you are able, such as: ? Water. ? Ice chips. ? Fruit juice that has water added (diluted fruit juice). ? Low-calorie sports drinks.  Eat bland, easy to digest foods in small amounts as you are able, such as: ? Bananas. ? Applesauce. ? Rice. ? Lean meats. ? Toast. ? Crackers.  Avoid drinking fluids that contain a lot of sugar or caffeine.  Avoid alcohol.  Avoid spicy or fatty foods.  General instructions  Drink enough fluid to keep your pee (urine) clear or pale yellow.  Wash your hands often. If you cannot use soap and water, use hand sanitizer.  Make sure that all people in your household wash their hands well and often.  Rest at home while you get better.  Take over-the-counter and prescription medicines only as told by your doctor.  Breathe slowly and deeply when you feel sick to your  stomach.  Watch your condition for any changes.  Keep all follow-up visits as told by your doctor. This is important. Contact a doctor if:  You have a headache.  You have new symptoms.  You feel sicker to your stomach.  You have a fever.  You feel light-headed or dizzy.  You throw up.  You are not able to keep fluids down. Get help right away if:  You have pain in your chest, neck, arm, or jaw.  You feel very weak or you pass out (faint).  You have throw up that is bright red or looks like coffee grounds.  You have bloody or black poop (stools), or poop that looks like tar.  You have a very bad headache, a stiff neck, or both.  You have very bad pain, cramping, or bloating in your belly.  You have a rash.  You have trouble breathing or you are breathing very quickly.  Your heart is beating very quickly.  Your skin feels cold and clammy.  You feel confused.  You have pain while peeing.  You have signs of dehydration, such as: ? Dark pee, or very little or no pee. ? Cracked lips. ? Dry mouth. ? Sunken eyes. ? Sleepiness. ? Weakness. These symptoms may be an emergency. Do not wait to see if the symptoms will go away. Get medical help right away. Call your local emergency services (911 in the [place] Do not drive yourself to   the hospital. This information is not intended to replace advice given to you by your health care provider. Make sure you discuss any questions you have with your health care provider. Document Released: 02/06/2011 Document Revised: 07/26/2015 Document Reviewed: 10/24/2014 Elsevier Interactive Patient Education  2018 Elsevier Inc.  

## 2017-11-12 NOTE — Progress Notes (Signed)
Subjective:    Patient ID: [name], female    DOB: [birth year], 52 y.o.   MRN: [id number]  HPI  Pt presents to the clinic today with c/o persistent nausea and diarrhea. This has been going on for 2-3 months. She is nauseated every day. She intermittently vomits. She has multiple loose stools per day, usually accompanied by some abdominal cramping that is relieved but the BM . She recently underwent an EGD which showed diverticulosis of the small intestine. A biopsy of the stomach was taken and negative for H Pylori. She also had a colonoscopy which showed 2 sessile polyps, otherwise normal. She was started on Omeprazole 20 mg BID. She has been taking Zofran and Phenergan which seems to help, but she is out of these medications and is requesting refills today. She also smokes THC to help with symptoms, but not daily. She denies recent travel or antibiotic use. She has a follow up with GI in October.  Review of Systems      Past Medical History:  Diagnosis Date  . Anxiety   . Arthritis    neck, knees, shoulders  . Asthma   . Bipolar disorder ([place])    currently feeling MANIC- 10/02/2016  . Depression   . GERD (gastroesophageal reflux disease)   . History of blood transfusion    as a newborn   . History of lump of left breast   . Hyperlipidemia   . Lumbar pseudoarthrosis   . Motion sickness    cars  . OSA (obstructive sleep apnea) 01/09/2016   can't afford CPAP  . Personality disorder ([place])   . PONV (postoperative nausea and vomiting)   . Post traumatic stress disorder (PTSD)   . Substance abuse ([place])   . Synovial cyst     Current Outpatient Medications  Medication Sig Dispense Refill  . albuterol (PROVENTIL HFA;VENTOLIN HFA) 108 (90 Base) MCG/ACT inhaler Inhale 2 puffs into the lungs every 4 (four) hours as needed for wheezing or shortness of breath. 1 Inhaler 1  . aspirin 81 MG tablet Take 81 mg by mouth daily.    Marland Kitchen buPROPion (WELLBUTRIN XL) 150 MG 24 hr tablet TAKE 1  TABLET BY MOUTH EVERY DAY IN THE MORNING  2  . carisoprodol (SOMA) 350 MG tablet Take 350 mg by mouth every 6 (six) hours as needed.  0  . diphenoxylate-atropine (LOMOTIL) 2.5-0.025 MG tablet Take 1 tablet by mouth daily as needed for diarrhea or loose stools. 30 tablet 0  . gabapentin (NEURONTIN) 300 MG capsule Take 1 capsule by mouth 3 (three) times daily.    Marland Kitchen HYDROcodone-acetaminophen (NORCO) 7.5-325 MG tablet hydrocodone 7.5 mg-acetaminophen 325 mg tablet    . ibuprofen (ADVIL,MOTRIN) 800 MG tablet ibuprofen 800 mg tablet    . morphine (MS CONTIN) 15 MG 12 hr tablet Take 1 tablet by mouth 3 (three) times daily.    Marland Kitchen omeprazole (PRILOSEC) 20 MG capsule Take 1 capsule (20 mg total) by mouth 2 (two) times daily before a meal. 60 capsule 0  . ondansetron (ZOFRAN) 4 MG tablet Take 1 tablet (4 mg total) by mouth every 8 (eight) hours as needed for nausea or vomiting (1-2 tablets prior to each colon prep). 4 tablet 1  . paliperidone (INVEGA) 3 MG 24 hr tablet TAKE 1 TABLET BY MOUTH EVERYDAY AT BEDTIME  2  . PEG-KCl-NaCl-NaSulf-Na Asc-C (PLENVU) 140 g SOLR Take 1 kit by mouth daily. 1 each 0  . promethazine (PHENERGAN) 25 MG tablet Take  1 tablet (25 mg total) by mouth every 8 (eight) hours as needed for up to 14 doses for nausea or vomiting. 14 tablet 0  . simvastatin (ZOCOR) 20 MG tablet Take 1 tablet (20 mg total) by mouth daily at 6 PM. 30 tablet 10   No current facility-administered medications for this visit.     Allergies  Allergen Reactions  . Effexor [Venlafaxine] Other (See Comments)    UNSPECIFIED REACTION, headaches, felt funny, withdrawal with missed dose   . Lamictal [Lamotrigine] Rash    Family History  Problem Relation Age of Onset  . Heart disease Father   . Hyperlipidemia Father   . Alcohol abuse Brother   . Alcohol abuse Paternal Uncle   . Breast cancer Maternal Aunt        70's  . Colon cancer Neg Hx     Social History   Socioeconomic History  . Marital status:  Single    Spouse name: Not on file  . Number of children: Not on file  . Years of education: Not on file  . Highest education level: Not on file  Occupational History  . Not on file  Social Needs  . Financial resource strain: Not on file  . Food insecurity:    Worry: Not on file    Inability: Not on file  . Transportation needs:    Medical: Not on file    Non-medical: Not on file  Tobacco Use  . Smoking status: Former Smoker    Packs/day: 1.00    Years: 1.00    Pack years: 1.00    Types: Cigarettes  . Smokeless tobacco: Never Used  . Tobacco comment: quit smoking 2016  Substance and Sexual Activity  . Alcohol use: No    Alcohol/week: 0.0 standard drinks    Comment: quit 20 years  . Drug use: Yes    Frequency: 14.0 times per week    Types: Marijuana    Comment: 20 yrs. ago- cocaine   . Sexual activity: Not Currently  Lifestyle  . Physical activity:    Days per week: Not on file    Minutes per session: Not on file  . Stress: Not on file  Relationships  . Social connections:    Talks on phone: Not on file    Gets together: Not on file    Attends religious service: Not on file    Active member of club or organization: Not on file    Attends meetings of clubs or organizations: Not on file    Relationship status: Not on file  . Intimate partner violence:    Fear of current or ex partner: Not on file    Emotionally abused: Not on file    Physically abused: Not on file    Forced sexual activity: Not on file  Other Topics Concern  . Not on file  Social History Narrative  . Not on file     Constitutional: Denies fever, malaise, fatigue, headache or abrupt weight changes.  Gastrointestinal: Pt reports nausea and diarrhea. Denies abdominal pain, bloating, constipation, or blood in the stool.    No other specific complaints in a complete review of systems (except as listed in HPI above).  Objective:   Physical Exam   BP 130/84   Pulse 82   Temp 98.3 F (36.8  C) (Oral)   Wt 132 lb (59.9 kg)   SpO2 97%   BMI 24.94 kg/m  Wt Readings from Last 3 Encounters:  11/12/17 132  lb (59.9 kg)  11/04/17 134 lb (60.8 kg)  10/28/17 133 lb (60.3 kg)    General: Appears her stated age, well developed, well nourished in NAD. Cardiovascular: Normal rate and rhythm.  Pulmonary/Chest: Normal effort and positive vesicular breath sounds. No respiratory distress. No wheezes, rales or ronchi noted.  Abdomen: Soft and nontender. Normal bowel sounds. No distention or masses noted.  Neurological: Alert and oriented.    BMET    Component Value Date/Time   NA 133 (L) 09/10/2017 1024   K 4.5 09/10/2017 1024   CL 99 09/10/2017 1024   CO2 28 09/10/2017 1024   GLUCOSE 71 09/10/2017 1024   BUN 8 09/10/2017 1024   CREATININE 0.71 09/10/2017 1024   CALCIUM 9.1 09/10/2017 1024   GFRNONAA >60 10/28/2016 0659   GFRAA >60 10/28/2016 0659    Lipid Panel     Component Value Date/Time   CHOL 168 05/21/2017 1422   TRIG 137.0 05/21/2017 1422   HDL 65.70 05/21/2017 1422   CHOLHDL 3 05/21/2017 1422   VLDL 27.4 05/21/2017 1422   LDLCALC 75 05/21/2017 1422    CBC    Component Value Date/Time   WBC 7.1 09/10/2017 1024   RBC 4.21 09/10/2017 1024   HGB 13.5 09/10/2017 1024   HCT 39.4 09/10/2017 1024   PLT 344.0 09/10/2017 1024   MCV 93.7 09/10/2017 1024   MCH 30.9 10/28/2016 0659   MCHC 34.4 09/10/2017 1024   RDW 14.6 09/10/2017 1024   LYMPHSABS 2.1 10/02/2016 0945   MONOABS 0.4 10/02/2016 0945   EOSABS 0.2 10/02/2016 0945   BASOSABS 0.0 10/02/2016 0945    Hgb A1C No results found for: HGBA1C         Assessment & Plan:   Nausea and Diarrhea:  Will check GI Pathogen panel today Continue Omeprazole Zofran refilled today Advised her to keep a food diary to see if she can identify any triggers for her nausea, diarrhea Consider referral to allergy if GI can not find anything  Return precautions disucssed Webb Silversmith, NP

## 2017-11-17 ENCOUNTER — Other Ambulatory Visit: Payer: Medicare HMO

## 2017-11-17 DIAGNOSIS — R197 Diarrhea, unspecified: Secondary | ICD-10-CM | POA: Diagnosis not present

## 2017-11-17 DIAGNOSIS — A09 Infectious gastroenteritis and colitis, unspecified: Secondary | ICD-10-CM | POA: Diagnosis not present

## 2017-11-23 ENCOUNTER — Ambulatory Visit (INDEPENDENT_AMBULATORY_CARE_PROVIDER_SITE_OTHER): Payer: Medicare HMO | Admitting: Internal Medicine

## 2017-11-23 ENCOUNTER — Encounter: Payer: Self-pay | Admitting: Internal Medicine

## 2017-11-23 VITALS — BP 140/80 | HR 82 | Temp 97.6°F | Ht 61.0 in | Wt 135.0 lb

## 2017-11-23 DIAGNOSIS — J209 Acute bronchitis, unspecified: Secondary | ICD-10-CM

## 2017-11-23 MED ORDER — HYDROCODONE-HOMATROPINE 5-1.5 MG/5ML PO SYRP: 5.0000 mL | ORAL_SOLUTION | Freq: Three times a day (TID) | ORAL | 0 refills | Status: DC | PRN

## 2017-11-23 MED ORDER — AZITHROMYCIN 250 MG PO TABS: ORAL_TABLET | ORAL | 0 refills | Status: DC

## 2017-11-23 MED ORDER — PREDNISONE 10 MG PO TABS: ORAL_TABLET | ORAL | 0 refills | Status: DC

## 2017-11-23 NOTE — Progress Notes (Signed)
HPI  Pt presents to the clinic today with c/o nasal congestion, sore throat and cough. This started 4 days ago. She is not able to blow anything out of her nose. She denies difficulty swallowing. The cough is non productive. She reports associated wheezing. She denies fever but has had chills and body aches. She has tried Robitussin OTC. She has not had sick contacts. She does have a history of asthma and does smoke.   Review of Systems      Past Medical History:  Diagnosis Date  . Anxiety   . Arthritis    neck, knees, shoulders  . Asthma   . Bipolar disorder ([place])    currently feeling MANIC- 10/02/2016  . Depression   . GERD (gastroesophageal reflux disease)   . History of blood transfusion    as a newborn   . History of lump of left breast   . Hyperlipidemia   . Lumbar pseudoarthrosis   . Motion sickness    cars  . OSA (obstructive sleep apnea) 01/09/2016   can't afford CPAP  . Personality disorder ([place])   . PONV (postoperative nausea and vomiting)   . Post traumatic stress disorder (PTSD)   . Substance abuse ([place])   . Synovial cyst     Family History  Problem Relation Age of Onset  . Heart disease Father   . Hyperlipidemia Father   . Alcohol abuse Brother   . Alcohol abuse Paternal Uncle   . Breast cancer Maternal Aunt        70's  . Colon cancer Neg Hx     Social History   Socioeconomic History  . Marital status: Single    Spouse name: Not on file  . Number of children: Not on file  . Years of education: Not on file  . Highest education level: Not on file  Occupational History  . Not on file  Social Needs  . Financial resource strain: Not on file  . Food insecurity:    Worry: Not on file    Inability: Not on file  . Transportation needs:    Medical: Not on file    Non-medical: Not on file  Tobacco Use  . Smoking status: Former Smoker    Packs/day: 1.00    Years: 1.00    Pack years: 1.00    Types: Cigarettes  . Smokeless tobacco: Never Used  .  Tobacco comment: quit smoking 2016  Substance and Sexual Activity  . Alcohol use: No    Alcohol/week: 0.0 standard drinks    Comment: quit 20 years  . Drug use: Yes    Frequency: 14.0 times per week    Types: Marijuana    Comment: 20 yrs. ago- cocaine   . Sexual activity: Not Currently  Lifestyle  . Physical activity:    Days per week: Not on file    Minutes per session: Not on file  . Stress: Not on file  Relationships  . Social connections:    Talks on phone: Not on file    Gets together: Not on file    Attends religious service: Not on file    Active member of club or organization: Not on file    Attends meetings of clubs or organizations: Not on file    Relationship status: Not on file  . Intimate partner violence:    Fear of current or ex partner: Not on file    Emotionally abused: Not on file    Physically abused: Not on  file    Forced sexual activity: Not on file  Other Topics Concern  . Not on file  Social History Narrative  . Not on file    Allergies  Allergen Reactions  . Effexor [Venlafaxine] Other (See Comments)    UNSPECIFIED REACTION, headaches, felt funny, withdrawal with missed dose   . Lamictal [Lamotrigine] Rash     Constitutional: Positive chills and body aches. Denies headache, fatigue, fever or abrupt weight changes.  HEENT:  Positive nasal congestion, sore throat. Denies eye redness, eye pain, pressure behind the eyes, facial pain, ear pain, ringing in the ears, wax buildup, runny nose or bloody nose. Respiratory: Positive cough. Denies difficulty breathing or shortness of breath.  Cardiovascular: Denies chest pain, chest tightness, palpitations or swelling in the hands or feet.   No other specific complaints in a complete review of systems (except as listed in HPI above).  Objective:   BP 140/80 (BP Location: Left Arm, Patient Position: Sitting, Cuff Size: Normal)   Pulse 82   Temp 97.6 F (36.4 C) (Oral)   Ht 5\' 1"  (1.549 m)   Wt 135 lb  (61.2 kg)   SpO2 98%   BMI 25.51 kg/m  Wt Readings from Last 3 Encounters:  11/23/17 135 lb (61.2 kg)  11/12/17 132 lb (59.9 kg)  11/04/17 134 lb (60.8 kg)     General: Appears her stated age, in NAD. HEENT: Head: normal shape and size, mild maxillary sinus tenderness noted;  Ears: Tm's gray and intact, normal light reflex; Nose: mucosa pink and moist, septum midline; Throat/Mouth: + PND. Teeth present, mucosa erythematous and moist, no exudate noted, no lesions or ulcerations noted.  Neck: Anterior cervical adenopathy noted on the right. Cardiovascular: Normal rate and rhythm. S1,S2 noted.  No murmur, rubs or gallops noted.  Pulmonary/Chest: Normal effort with scattered rhonchi and bilateral expiratory wheezing noted. No respiratory distress.      Assessment & Plan:   Acute Bronchitis:  Get some rest and drink plenty of water Do salt water gargles for the sore throat eRx for Azithromax x 5 days eRx for Pred Taper x 6 days Continue Albuterol inhaler eRx for Hycodan cough syrup  RTC as needed or if symptoms persist.   Webb Silversmith, NP

## 2017-11-23 NOTE — Patient Instructions (Signed)

## 2017-11-26 LAB — GASTROINTESTINAL PATHOGEN PANEL PCR
C. DIFFICILE TOX A/B, PCR: NOT DETECTED
CRYPTOSPORIDIUM, PCR: NOT DETECTED
Campylobacter, PCR: NOT DETECTED
E COLI (ETEC) LT/ST, PCR: NOT DETECTED
E COLI 0157, PCR: NOT DETECTED
E coli (STEC) stx1/stx2, PCR: NOT DETECTED
GIARDIA LAMBLIA, PCR: NOT DETECTED
Norovirus, PCR: NOT DETECTED
Rotavirus A, PCR: NOT DETECTED
Salmonella, PCR: NOT DETECTED
Shigella, PCR: NOT DETECTED

## 2017-12-21 ENCOUNTER — Encounter: Payer: Self-pay | Admitting: Gastroenterology

## 2017-12-21 ENCOUNTER — Ambulatory Visit: Payer: Medicare HMO | Admitting: Gastroenterology

## 2017-12-21 ENCOUNTER — Other Ambulatory Visit: Payer: Self-pay

## 2017-12-21 VITALS — BP 125/81 | HR 67 | Resp 16 | Ht 61.0 in | Wt 139.0 lb

## 2017-12-21 DIAGNOSIS — R1013 Epigastric pain: Secondary | ICD-10-CM

## 2017-12-21 DIAGNOSIS — R112 Nausea with vomiting, unspecified: Secondary | ICD-10-CM | POA: Diagnosis not present

## 2017-12-21 MED ORDER — OMEPRAZOLE 20 MG PO CPDR: 20.0000 mg | DELAYED_RELEASE_CAPSULE | Freq: Every day | ORAL | 0 refills | Status: DC

## 2017-12-21 MED ORDER — ONDANSETRON HCL 4 MG PO TABS: 4.0000 mg | ORAL_TABLET | Freq: Two times a day (BID) | ORAL | 0 refills | Status: DC

## 2017-12-21 NOTE — Progress Notes (Signed)
Cephas Darby, MD [address]  [place]  [place], [place] [address]  Main: [phone]  Fax: [phone]    Gastroenterology Consultation  Referring Provider:     Jearld Fenton, NP Primary Care Physician:  Jearld Fenton, NP Primary Gastroenterologist:  Dr. Cephas Darby Reason for Consultation:     Intractable nausea and vomiting        HPI:   [name] is a 52 y.o. female referred by Dr. Garnette Gunner, Coralie Keens, NP  for consultation & management of intractable nausea and vomiting. Patient has history of bipolar, chronic marijuana use, tobacco use, prior history of cocaine use reports more than 1 year history of episodes of nausea and emesis. She smokes marijuana every day. She reports nausea yesterday and was lying on the exam table when I saw her. She had sausage for breakfast and threw up during my encounter with her. She has been taking Zofran as needed as well as Phenergan. She ran out of Phenergan prescription. She also reports epigastric discomfort. She denies diarrhea or constipation. She is not sure if she is taking PPI. She denies melena or rectal bleeding. Most recent labs in about a month ago have been normal including CBC, CMP, lipase and right upper quadrant ultrasound She is currently on morphine for chronic back pain, takes Wellbutrin as well as gabapentin  Follow-up visit 12/21/2017 Patient underwent EGD and colonoscopy which were fairly unremarkable.  Gastric and duodenal biopsies were normal.  Her symptoms are currently under control on Prilosec 40 mg daily and Zofran twice daily.  Her weight has been stable, in fact she gained few pounds.  She continues to have nonbloody loose stools.  Patient expressed concern about psychiatric medications that she was taking in the past.  NSAIDs: none  Antiplts/Anticoagulants/Anti thrombotics: none  GI Procedures:  EGD 11/04/17 - Non-bleeding duodenal diverticulum. - Normal mucosa was found in the duodenal bulb and in  the second portion of the duodenum. Biopsied. - Normal stomach. Biopsied. - Small hiatal hernia. - Normal gastroesophageal junction and esophagus.  Colonoscopy 11/04/17 - One 5 mm polyp in the descending colon, removed with a cold snare. Resected and retrieved. - One diminutive polyp in the sigmoid colon, removed with a cold snare. Complete resection. Polyp tissue not retrieved.  She denies family history of GI malignancy  Past Medical History:  Diagnosis Date  . Anxiety   . Arthritis    neck, knees, shoulders  . Asthma   . Bipolar disorder ([place])    currently feeling MANIC- 10/02/2016  . Depression   . GERD (gastroesophageal reflux disease)   . History of blood transfusion    as a newborn   . History of lump of left breast   . Hyperlipidemia   . Lumbar pseudoarthrosis   . Motion sickness    cars  . OSA (obstructive sleep apnea) 01/09/2016   can't afford CPAP  . Personality disorder ([place])   . PONV (postoperative nausea and vomiting)   . Post traumatic stress disorder (PTSD)   . Substance abuse ([place])   . Synovial cyst     Past Surgical History:  Procedure Laterality Date  . COLONOSCOPY WITH PROPOFOL N/A 11/04/2017   Procedure: COLONOSCOPY WITH PROPOFOL;  Surgeon: Lin Landsman, MD;  Location: [facility];  Service: Endoscopy;  Laterality: N/A;  . ESOPHAGOGASTRODUODENOSCOPY (EGD) WITH PROPOFOL N/A 11/04/2017   Procedure: ESOPHAGOGASTRODUODENOSCOPY (EGD) WITH PROPOFOL with biopsies;  Surgeon: Lin Landsman, MD;  Location: [facility];  Service: Endoscopy;  Laterality: N/A;  sleep apnea  . KNEE SURGERY Left    x5, post basketball injury  . LUMBAR LAMINECTOMY/DECOMPRESSION MICRODISCECTOMY Left 01/22/2016   Procedure: Laminectomy for facet/synovial cyst - left - Lumbar four - lumbar five;  Surgeon: Earnie Larsson, MD;  Location: [facility];  Service: Neurosurgery;  Laterality: Left;  Laminectomy for facet/synovial cyst - left - Lumbar four - lumbar five  .  POLYPECTOMY N/A 11/04/2017   Procedure: POLYPECTOMY INTESTINAL;  Surgeon: Lin Landsman, MD;  Location: [facility];  Service: Endoscopy;  Laterality: N/A;  . SHOULDER SURGERY Left    x2  . TOE SURGERY Bilateral    bone spurs    Current Outpatient Medications:  .  albuterol (PROVENTIL HFA;VENTOLIN HFA) 108 (90 Base) MCG/ACT inhaler, Inhale 2 puffs into the lungs every 4 (four) hours as needed for wheezing or shortness of breath., Disp: 1 Inhaler, Rfl: 1 .  aspirin 81 MG tablet, Take 81 mg by mouth daily., Disp: , Rfl:  .  carisoprodol (SOMA) 350 MG tablet, Take 350 mg by mouth every 6 (six) hours as needed., Disp: , Rfl: 0 .  diphenoxylate-atropine (LOMOTIL) 2.5-0.025 MG tablet, Take 1 tablet by mouth daily as needed for diarrhea or loose stools., Disp: 30 tablet, Rfl: 0 .  gabapentin (NEURONTIN) 300 MG capsule, Take 1 capsule by mouth 3 (three) times daily., Disp: , Rfl:  .  morphine (MS CONTIN) 15 MG 12 hr tablet, Take 1 tablet by mouth 3 (three) times daily., Disp: , Rfl:  .  ondansetron (ZOFRAN) 4 MG tablet, Take 1 tablet (4 mg total) by mouth 2 (two) times daily., Disp: 60 tablet, Rfl: 0 .  promethazine (PHENERGAN) 25 MG tablet, Take 1 tablet (25 mg total) by mouth every 8 (eight) hours as needed for up to 14 doses for nausea or vomiting., Disp: 14 tablet, Rfl: 0 .  simvastatin (ZOCOR) 20 MG tablet, Take 1 tablet (20 mg total) by mouth daily at 6 PM., Disp: 30 tablet, Rfl: 10 .  omeprazole (PRILOSEC) 20 MG capsule, Take 1 capsule (20 mg total) by mouth daily before breakfast., Disp: 90 capsule, Rfl: 0   Family History  Problem Relation Age of Onset  . Heart disease Father   . Hyperlipidemia Father   . Alcohol abuse Brother   . Alcohol abuse Paternal Uncle   . Breast cancer Maternal Aunt        70's  . Colon cancer Neg Hx      Social History   Tobacco Use  . Smoking status: Former Smoker    Packs/day: 1.00    Years: 1.00    Pack years: 1.00    Types: Cigarettes    . Smokeless tobacco: Never Used  . Tobacco comment: quit smoking 2016  Substance Use Topics  . Alcohol use: No    Alcohol/week: 0.0 standard drinks    Comment: quit 20 years  . Drug use: Yes    Frequency: 14.0 times per week    Types: Marijuana    Comment: 20 yrs. ago- cocaine     Allergies as of 12/21/2017 - Review Complete 12/21/2017  Allergen Reaction Noted  . Effexor [venlafaxine] Other (See Comments) 11/21/2015  . Lamictal [lamotrigine] Rash 11/21/2015    Review of Systems:    All systems reviewed and negative except where noted in HPI.   Physical Exam:  BP 125/81 (BP Location: Left Arm, Patient Position: Sitting, Cuff Size: Normal)   Pulse 67  Resp 16   Ht 5\' 1"  (1.549 m)   Wt 139 lb (63 kg)   BMI 26.26 kg/m  No LMP recorded. Patient is postmenopausal.  General:   Alert,  Well-developed, well-nourished, pleasant and cooperative in NAD Head:  Normocephalic and atraumatic. Eyes:  Sclera clear, no icterus.   Conjunctiva pink. Ears:  Normal auditory acuity. Nose:  No deformity, discharge, or lesions. Mouth:  No deformity or lesions,oropharynx pink & moist. Neck:  Supple; no masses or thyromegaly. Lungs:  Respirations even and unlabored.  Clear throughout to auscultation.   No wheezes, crackles, or rhonchi. No acute distress. Heart:  Regular rate and rhythm; no murmurs, clicks, rubs, or gallops. Abdomen:  Normal bowel sounds. Soft, Mild epigastric tenderness and non-distended without masses, hepatosplenomegaly or hernias noted.  No guarding or rebound tenderness.   Rectal: Not performed Msk:  Symmetrical without gross deformities. Good, equal movement & strength bilaterally. Pulses:  Normal pulses noted. Extremities:  No clubbing or edema.  No cyanosis. Neurologic:  Alert and oriented x3;  grossly normal neurologically. Skin:  Intact without significant lesions or rashes. No jaundice. Lymph Nodes:  No significant cervical adenopathy. Psych:  Alert and cooperative.  Normal mood and affect.  Imaging Studies: reviewed  Assessment and Plan:   [name] is a 52 y.o. Caucasian female with polysubstance abuse, chronic marijuana use, chronic opioid use, bipolar disorder seen for follow-up of intractable nausea and vomiting.  Symptoms are under control with Prilosec and Zofran.   Intractable nausea: Psychogenic or secondary to chronic marijuana use -Advised patient to quit smoking marijuana -H. pylori negative -Continue Zofran 4 mg SL every 6 hours -Continue Prilosec 20 mg once daily -Patient is not willing to try tricyclic antidepressant at this time   Tubular adenoma: Recommend surveillance colonoscopy in 5 years   Follow up in 3 months   Cephas Darby, MD

## 2017-12-22 ENCOUNTER — Other Ambulatory Visit: Payer: Self-pay | Admitting: Gastroenterology

## 2017-12-22 DIAGNOSIS — R1013 Epigastric pain: Secondary | ICD-10-CM

## 2017-12-23 ENCOUNTER — Encounter: Payer: Self-pay | Admitting: Family Medicine

## 2017-12-23 ENCOUNTER — Ambulatory Visit (INDEPENDENT_AMBULATORY_CARE_PROVIDER_SITE_OTHER): Payer: Medicare HMO | Admitting: Family Medicine

## 2017-12-23 VITALS — BP 122/68 | HR 88 | Temp 97.6°F | Ht 61.0 in | Wt 141.1 lb

## 2017-12-23 DIAGNOSIS — J069 Acute upper respiratory infection, unspecified: Secondary | ICD-10-CM

## 2017-12-23 DIAGNOSIS — B9789 Other viral agents as the cause of diseases classified elsewhere: Secondary | ICD-10-CM

## 2017-12-23 MED ORDER — GUAIFENESIN-CODEINE 100-10 MG/5ML PO SYRP: 5.0000 mL | ORAL_SOLUTION | Freq: Three times a day (TID) | ORAL | 0 refills | Status: DC | PRN

## 2017-12-23 NOTE — Progress Notes (Signed)
Subjective:    Patient ID: [name], female    DOB: 11/10/1965, 52 y.o.   MRN: [id number]  HPI This is a 52 yo female who presents today with off and on cough for 1 month. Was seen 11/23/17 with bronchitis. Was treated with Zpack, prednisone, cough syrup with improvement. Some sore throat, no ear pain. No fever. Mostly dry cough, some sputum in am. Parents also sick. Has been feeling hot for awhile. No SOB, a little wheeze. Non smoker.  Continued nausea, saw GI on Monday.   Past Medical History:  Diagnosis Date  . Anxiety   . Arthritis    neck, knees, shoulders  . Asthma   . Bipolar disorder ([place])    currently feeling MANIC- 10/02/2016  . Depression   . GERD (gastroesophageal reflux disease)   . History of blood transfusion    as a newborn   . History of lump of left breast   . Hyperlipidemia   . Lumbar pseudoarthrosis   . Motion sickness    cars  . OSA (obstructive sleep apnea) 01/09/2016   can't afford CPAP  . Personality disorder ([place])   . PONV (postoperative nausea and vomiting)   . Post traumatic stress disorder (PTSD)   . Substance abuse ([place])   . Synovial cyst    Past Surgical History:  Procedure Laterality Date  . COLONOSCOPY WITH PROPOFOL N/A 11/04/2017   Procedure: COLONOSCOPY WITH PROPOFOL;  Surgeon: Lin Landsman, MD;  Location: [facility];  Service: Endoscopy;  Laterality: N/A;  . ESOPHAGOGASTRODUODENOSCOPY (EGD) WITH PROPOFOL N/A 11/04/2017   Procedure: ESOPHAGOGASTRODUODENOSCOPY (EGD) WITH PROPOFOL with biopsies;  Surgeon: Lin Landsman, MD;  Location: [facility];  Service: Endoscopy;  Laterality: N/A;  sleep apnea  . KNEE SURGERY Left    x5, post basketball injury  . LUMBAR LAMINECTOMY/DECOMPRESSION MICRODISCECTOMY Left 01/22/2016   Procedure: Laminectomy for facet/synovial cyst - left - Lumbar four - lumbar five;  Surgeon: Earnie Larsson, MD;  Location: [facility];  Service: Neurosurgery;  Laterality: Left;  Laminectomy for  facet/synovial cyst - left - Lumbar four - lumbar five  . POLYPECTOMY N/A 11/04/2017   Procedure: POLYPECTOMY INTESTINAL;  Surgeon: Lin Landsman, MD;  Location: [facility];  Service: Endoscopy;  Laterality: N/A;  . SHOULDER SURGERY Left    x2  . TOE SURGERY Bilateral    bone spurs   Family History  Problem Relation Age of Onset  . Heart disease Father   . Hyperlipidemia Father   . Alcohol abuse Brother   . Alcohol abuse Paternal Uncle   . Breast cancer Maternal Aunt        70's  . Colon cancer Neg Hx    Social History   Tobacco Use  . Smoking status: Former Smoker    Packs/day: 1.00    Years: 1.00    Pack years: 1.00    Types: Cigarettes  . Smokeless tobacco: Never Used  . Tobacco comment: quit smoking 2016  Substance Use Topics  . Alcohol use: No    Alcohol/week: 0.0 standard drinks    Comment: quit 20 years  . Drug use: Yes    Frequency: 14.0 times per week    Types: Marijuana    Comment: 20 yrs. ago- cocaine      Review of Systems Per HPI    Objective:   Physical Exam  Constitutional: She is oriented to person, place, and time. She appears well-developed and well-nourished.  HENT:  Head: Normocephalic  and atraumatic.  Right Ear: Tympanic membrane, external ear and ear canal normal.  Left Ear: Tympanic membrane, external ear and ear canal normal.  Nose: Mucosal edema and rhinorrhea present.  Mouth/Throat: Uvula is midline and oropharynx is clear and moist.  Eyes: Conjunctivae are normal.  Neck: Normal range of motion. Neck supple.  Cardiovascular: Normal rate, regular rhythm and normal heart sounds.  Pulmonary/Chest: Effort normal and breath sounds normal.  Lymphadenopathy:    She has no cervical adenopathy.  Neurological: She is alert and oriented to person, place, and time.  Skin: Skin is warm and dry.  Psychiatric: Her mood appears anxious.  Very restless. Stated she was hot (it was warm in exam room) and was afraid she was going to  vomit.   Vitals reviewed.     BP 122/68 (BP Location: Left Arm, Patient Position: Sitting, Cuff Size: Normal)   Pulse 88   Temp 97.6 F (36.4 C) (Oral)   Ht 5\' 1"  (1.549 m)   Wt 141 lb 1.9 oz (64 kg)   SpO2 97%   BMI 26.66 kg/m  Wt Readings from Last 3 Encounters:  12/23/17 141 lb 1.9 oz (64 kg)  12/21/17 139 lb (63 kg)  11/23/17 135 lb (61.2 kg)       Assessment & Plan:  1. Viral URI with cough - suspect new viral illness and possible continued cough from previous illness which improved - discussed symptomatic treatment, RTC precautions, encouraged rest, increased fluids - guaiFENesin-codeine (CHERATUSSIN AC) 100-10 MG/5ML syrup; Take 5 mLs by mouth 3 (three) times daily as needed for cough.  Dispense: 120 mL; Refill: 0   Clarene Reamer, FNP-BC  [facility], [facility]  12/25/2017 5:42 AM

## 2017-12-23 NOTE — Patient Instructions (Addendum)
I think you have a virus or allergies or both  Take a Zyrtec (or other long acting antihistamine) daily for runny nose  Can use Afrin type nasal spray twice a day for up to 4 days for sinus pressure and congestion  Can use albuterol inhaler every 4 hours for chest tightness, wheeze or cough  If not better in 5-7 days, please let me know

## 2017-12-25 ENCOUNTER — Encounter: Payer: Self-pay | Admitting: Family Medicine

## 2017-12-31 ENCOUNTER — Ambulatory Visit (INDEPENDENT_AMBULATORY_CARE_PROVIDER_SITE_OTHER)
Admission: RE | Admit: 2017-12-31 | Discharge: 2017-12-31 | Disposition: A | Discharge status: Home / Self Care | Payer: Medicare HMO | Source: Ambulatory Visit | Attending: Internal Medicine | Admitting: Internal Medicine

## 2017-12-31 ENCOUNTER — Encounter: Payer: Self-pay | Admitting: Internal Medicine

## 2017-12-31 ENCOUNTER — Ambulatory Visit (INDEPENDENT_AMBULATORY_CARE_PROVIDER_SITE_OTHER): Payer: Medicare HMO | Admitting: Internal Medicine

## 2017-12-31 VITALS — BP 120/68 | HR 76 | Temp 97.9°F | Wt 140.0 lb

## 2017-12-31 DIAGNOSIS — J209 Acute bronchitis, unspecified: Secondary | ICD-10-CM

## 2017-12-31 DIAGNOSIS — R05 Cough: Secondary | ICD-10-CM | POA: Diagnosis not present

## 2017-12-31 DIAGNOSIS — J0100 Acute maxillary sinusitis, unspecified: Secondary | ICD-10-CM | POA: Diagnosis not present

## 2017-12-31 DIAGNOSIS — R0602 Shortness of breath: Secondary | ICD-10-CM | POA: Diagnosis not present

## 2017-12-31 MED ORDER — AMOXICILLIN-POT CLAVULANATE 875-125 MG PO TABS: 1.0000 | ORAL_TABLET | Freq: Two times a day (BID) | ORAL | 0 refills | Status: DC

## 2017-12-31 MED ORDER — PREDNISONE 10 MG PO TABS: ORAL_TABLET | ORAL | 0 refills | Status: DC

## 2017-12-31 NOTE — Progress Notes (Signed)
HPI  Pt presents to the clinic today with c/o facial pain and pressure, nasal congestion and cough. She reports this started about 2 weeks ago. She is blowing green mucous out of her nose. The cough is productive of brown mucous. She reports mild shortness of breath. She denies fever, chills or body aches.  She has tried Afrin without any relief.  She was treated for an acute bronchitis 9/23 with Prednisone and Azithromax. She was seen 1/23 for viral URI, given Guafensin AC for cough. She denies history of allergies, but does have asthma and does continue to smoke.  Review of Systems      Past Medical History:  Diagnosis Date  . Anxiety   . Arthritis    neck, knees, shoulders  . Asthma   . Bipolar disorder ([place])    currently feeling MANIC- 10/02/2016  . Depression   . GERD (gastroesophageal reflux disease)   . History of blood transfusion    as a newborn   . History of lump of left breast   . Hyperlipidemia   . Lumbar pseudoarthrosis   . Motion sickness    cars  . OSA (obstructive sleep apnea) 01/09/2016   can't afford CPAP  . Personality disorder ([place])   . PONV (postoperative nausea and vomiting)   . Post traumatic stress disorder (PTSD)   . Substance abuse ([place])   . Synovial cyst     Family History  Problem Relation Age of Onset  . Heart disease Father   . Hyperlipidemia Father   . Alcohol abuse Brother   . Alcohol abuse Paternal Uncle   . Breast cancer Maternal Aunt        70's  . Colon cancer Neg Hx     Social History   Socioeconomic History  . Marital status: Single    Spouse name: Not on file  . Number of children: Not on file  . Years of education: Not on file  . Highest education level: Not on file  Occupational History  . Not on file  Social Needs  . Financial resource strain: Not on file  . Food insecurity:    Worry: Not on file    Inability: Not on file  . Transportation needs:    Medical: Not on file    Non-medical: Not on file  Tobacco Use  .  Smoking status: Former Smoker    Packs/day: 1.00    Years: 1.00    Pack years: 1.00    Types: Cigarettes  . Smokeless tobacco: Never Used  . Tobacco comment: quit smoking 2016  Substance and Sexual Activity  . Alcohol use: No    Alcohol/week: 0.0 standard drinks    Comment: quit 20 years  . Drug use: Yes    Frequency: 14.0 times per week    Types: Marijuana    Comment: 20 yrs. ago- cocaine   . Sexual activity: Not Currently  Lifestyle  . Physical activity:    Days per week: Not on file    Minutes per session: Not on file  . Stress: Not on file  Relationships  . Social connections:    Talks on phone: Not on file    Gets together: Not on file    Attends religious service: Not on file    Active member of club or organization: Not on file    Attends meetings of clubs or organizations: Not on file    Relationship status: Not on file  . Intimate partner violence:  Fear of current or ex partner: Not on file    Emotionally abused: Not on file    Physically abused: Not on file    Forced sexual activity: Not on file  Other Topics Concern  . Not on file  Social History Narrative  . Not on file    Allergies  Allergen Reactions  . Effexor [Venlafaxine] Other (See Comments)    UNSPECIFIED REACTION, headaches, felt funny, withdrawal with missed dose   . Lamictal [Lamotrigine] Rash     Constitutional: Denies headache, fatigue, fever or abrupt weight changes.  HEENT:  Positive facial pain and pressure, nasal congestion. Denies eye redness, eye pain, pressure behind the eyes, facial pain, ear pain, ringing in the ears, wax buildup, or sore throat Respiratory: Positive cough and shortness of breath. Denies difficulty breathing.  Cardiovascular: Denies chest pain, chest tightness, palpitations or swelling in the hands or feet.   No other specific complaints in a complete review of systems (except as listed in HPI above).  Objective:   BP 120/68   Pulse 76   Temp 97.9 F (36.6  C) (Oral)   Wt 140 lb (63.5 kg)   SpO2 98%   BMI 26.45 kg/m   Wt Readings from Last 3 Encounters:  12/31/17 140 lb (63.5 kg)  12/23/17 141 lb 1.9 oz (64 kg)  12/21/17 139 lb (63 kg)     General: Appears her stated age, well developed, well nourished in NAD. HEENT: Head: normal shape and size, maxillary sinus tenderness noted;  Ears: Tm's gray and intact, normal light reflex; Nose: mucosa pink and moist, septum midline; Throat/Mouth: + PND. Teeth present, mucosa erythematous and moist, no exudate noted, no lesions or ulcerations noted.  Neck: No cervical lymphadenopathy.  Cardiovascular: Normal rate and rhythm. S1,S2 noted.  No murmur, rubs or gallops noted.  Pulmonary/Chest: Normal effort and scattered rhonchi and bilateral expiratory wheezing noted. No rales noted.       Assessment & Plan:   Acute Maxillary Sinusitis, Acute Bronchitis:  Get some rest and drink plenty of water eRx for Augmentin BID x 10 days eRx for Pred Taper x 6 days  Stop Afrin, start Flonase Chest xray today for further evaluation given the fact that she has recurrent infections  RTC as needed or if symptoms persist.   Webb Silversmith, NP

## 2017-12-31 NOTE — Patient Instructions (Signed)

## 2018-01-01 ENCOUNTER — Other Ambulatory Visit: Payer: Self-pay | Admitting: Gastroenterology

## 2018-01-01 DIAGNOSIS — R1013 Epigastric pain: Secondary | ICD-10-CM

## 2018-01-18 ENCOUNTER — Other Ambulatory Visit: Payer: Self-pay | Admitting: Gastroenterology

## 2018-01-18 ENCOUNTER — Other Ambulatory Visit: Payer: Self-pay | Admitting: Neurosurgery

## 2018-01-18 DIAGNOSIS — R1013 Epigastric pain: Secondary | ICD-10-CM

## 2018-01-18 DIAGNOSIS — M4317 Spondylolisthesis, lumbosacral region: Secondary | ICD-10-CM

## 2018-01-19 DIAGNOSIS — F3181 Bipolar II disorder: Secondary | ICD-10-CM | POA: Diagnosis not present

## 2018-02-04 ENCOUNTER — Ambulatory Visit
Admission: RE | Admit: 2018-02-04 | Discharge: 2018-02-04 | Disposition: A | Discharge status: Home / Self Care | Payer: Medicare HMO | Source: Ambulatory Visit | Attending: Neurosurgery | Admitting: Neurosurgery

## 2018-02-04 DIAGNOSIS — M4317 Spondylolisthesis, lumbosacral region: Secondary | ICD-10-CM

## 2018-02-04 DIAGNOSIS — R030 Elevated blood-pressure reading, without diagnosis of hypertension: Secondary | ICD-10-CM | POA: Diagnosis not present

## 2018-02-04 DIAGNOSIS — Z6827 Body mass index (BMI) 27.0-27.9, adult: Secondary | ICD-10-CM | POA: Diagnosis not present

## 2018-02-04 DIAGNOSIS — M545 Low back pain: Secondary | ICD-10-CM | POA: Diagnosis not present

## 2018-02-05 ENCOUNTER — Other Ambulatory Visit: Payer: Self-pay | Admitting: Gastroenterology

## 2018-02-05 DIAGNOSIS — R112 Nausea with vomiting, unspecified: Secondary | ICD-10-CM

## 2018-02-09 ENCOUNTER — Encounter: Payer: Self-pay | Admitting: Gastroenterology

## 2018-02-09 ENCOUNTER — Telehealth: Payer: Self-pay | Admitting: Gastroenterology

## 2018-02-09 ENCOUNTER — Ambulatory Visit: Payer: Medicare HMO | Admitting: Gastroenterology

## 2018-02-09 ENCOUNTER — Ambulatory Visit (INDEPENDENT_AMBULATORY_CARE_PROVIDER_SITE_OTHER): Payer: Medicare HMO | Admitting: Internal Medicine

## 2018-02-09 ENCOUNTER — Encounter: Payer: Self-pay | Admitting: Internal Medicine

## 2018-02-09 ENCOUNTER — Other Ambulatory Visit: Payer: Self-pay

## 2018-02-09 VITALS — BP 124/68 | HR 72 | Temp 97.9°F | Wt 138.0 lb

## 2018-02-09 VITALS — BP 143/100 | HR 62 | Resp 16 | Ht 61.0 in | Wt 137.6 lb

## 2018-02-09 DIAGNOSIS — R030 Elevated blood-pressure reading, without diagnosis of hypertension: Secondary | ICD-10-CM | POA: Diagnosis not present

## 2018-02-09 DIAGNOSIS — K529 Noninfective gastroenteritis and colitis, unspecified: Secondary | ICD-10-CM

## 2018-02-09 DIAGNOSIS — R6889 Other general symptoms and signs: Secondary | ICD-10-CM

## 2018-02-09 DIAGNOSIS — Z23 Encounter for immunization: Secondary | ICD-10-CM | POA: Diagnosis not present

## 2018-02-09 DIAGNOSIS — R448 Other symptoms and signs involving general sensations and perceptions: Secondary | ICD-10-CM

## 2018-02-09 DIAGNOSIS — N952 Postmenopausal atrophic vaginitis: Secondary | ICD-10-CM | POA: Insufficient documentation

## 2018-02-09 DIAGNOSIS — Z780 Asymptomatic menopausal state: Secondary | ICD-10-CM | POA: Insufficient documentation

## 2018-02-09 MED ORDER — DIPHENOXYLATE-ATROPINE 2.5-0.025 MG PO TABS: 1.0000 | ORAL_TABLET | Freq: Every day | ORAL | 0 refills | Status: DC | PRN

## 2018-02-09 NOTE — Progress Notes (Signed)
Cephas Darby, MD [address]  [place], [place] [address]  Main: [phone]  Fax: [phone]    Gastroenterology Consultation  Referring Provider:     Jearld Fenton, NP Primary Care Physician:  Jearld Fenton, NP Primary Gastroenterologist:  Dr. Cephas Darby Reason for Consultation: Diarrhea        HPI:   [name] is a 52 y.o. female referred by Dr. Garnette Gunner, Coralie Keens, NP  for consultation & management of intractable nausea and vomiting. Patient has history of bipolar, chronic marijuana use, tobacco use, prior history of cocaine use reports more than 1 year history of episodes of nausea and emesis. She smokes marijuana every day. She reports nausea yesterday and was lying on the exam table when I saw her. She had sausage for breakfast and threw up during my encounter with her. She has been taking Zofran as needed as well as Phenergan. She ran out of Phenergan prescription. She also reports epigastric discomfort. She denies diarrhea or constipation. She is not sure if she is taking PPI. She denies melena or rectal bleeding. Most recent labs in about a month ago have been normal including CBC, CMP, lipase and right upper quadrant ultrasound She is currently on morphine for chronic back pain, takes Wellbutrin as well as gabapentin  Follow-up visit 12/21/2017 Patient underwent EGD and colonoscopy which were fairly unremarkable.  Gastric and duodenal biopsies were normal.  Her symptoms are currently under control on Prilosec 40 mg daily and Zofran twice daily.  Her weight has been stable, in fact she gained few pounds.  She continues to have nonbloody loose stools.  Patient expressed concern about psychiatric medications that she was taking in the past.  Follow-up visit 2019 Since last visit, patient reported that her diarrhea resolved and she was having formed stool.  But for the last 1 to 2 weeks, she has been experiencing nonbloody loose stools, usually 3-4  times in the span of 2 hours after she wakes up in the morning.  She took Lomotil that she had from proximal prescription which helped.  She is requesting to refill Lomotil today.  She also reports nausea, abdominal cramps.  She denies eating out, sick contacts.  She lost about 2 pounds since last visit.  NSAIDs: none  Antiplts/Anticoagulants/Anti thrombotics: none  GI Procedures:  EGD 11/04/17 - Non-bleeding duodenal diverticulum. - Normal mucosa was found in the duodenal bulb and in the second portion of the duodenum. Biopsied. - Normal stomach. Biopsied. - Small hiatal hernia. - Normal gastroesophageal junction and esophagus.  Colonoscopy 11/04/17 - One 5 mm polyp in the descending colon, removed with a cold snare. Resected and retrieved. - One diminutive polyp in the sigmoid colon, removed with a cold snare. Complete resection. Polyp tissue not retrieved.  She denies family history of GI malignancy  Past Medical History:  Diagnosis Date  . Anxiety   . Arthritis    neck, knees, shoulders  . Asthma   . Bipolar disorder ([place])    currently feeling MANIC- 10/02/2016  . Depression   . GERD (gastroesophageal reflux disease)   . History of blood transfusion    as a newborn   . History of lump of left breast   . Hyperlipidemia   . Lumbar pseudoarthrosis   . Motion sickness    cars  . OSA (obstructive sleep apnea) 01/09/2016   can't afford CPAP  . Personality disorder ([place])   . PONV (postoperative nausea and  vomiting)   . Post traumatic stress disorder (PTSD)   . Substance abuse ([place])   . Synovial cyst     Past Surgical History:  Procedure Laterality Date  . COLONOSCOPY WITH PROPOFOL N/A 11/04/2017   Procedure: COLONOSCOPY WITH PROPOFOL;  Surgeon: Lin Landsman, MD;  Location: [facility];  Service: Endoscopy;  Laterality: N/A;  . ESOPHAGOGASTRODUODENOSCOPY (EGD) WITH PROPOFOL N/A 11/04/2017   Procedure: ESOPHAGOGASTRODUODENOSCOPY (EGD) WITH PROPOFOL with biopsies;   Surgeon: Lin Landsman, MD;  Location: [facility];  Service: Endoscopy;  Laterality: N/A;  sleep apnea  . KNEE SURGERY Left    x5, post basketball injury  . LUMBAR LAMINECTOMY/DECOMPRESSION MICRODISCECTOMY Left 01/22/2016   Procedure: Laminectomy for facet/synovial cyst - left - Lumbar four - lumbar five;  Surgeon: Earnie Larsson, MD;  Location: [facility];  Service: Neurosurgery;  Laterality: Left;  Laminectomy for facet/synovial cyst - left - Lumbar four - lumbar five  . POLYPECTOMY N/A 11/04/2017   Procedure: POLYPECTOMY INTESTINAL;  Surgeon: Lin Landsman, MD;  Location: [facility];  Service: Endoscopy;  Laterality: N/A;  . SHOULDER SURGERY Left    x2  . TOE SURGERY Bilateral    bone spurs    Current Outpatient Medications:  .  albuterol (PROVENTIL HFA;VENTOLIN HFA) 108 (90 Base) MCG/ACT inhaler, Inhale 2 puffs into the lungs every 4 (four) hours as needed for wheezing or shortness of breath., Disp: 1 Inhaler, Rfl: 1 .  aspirin 81 MG tablet, Take 81 mg by mouth daily., Disp: , Rfl:  .  buPROPion (WELLBUTRIN XL) 150 MG 24 hr tablet, TAKE 1 TABLET BY MOUTH EVERY DAY IN THE MORNING, Disp: , Rfl: 2 .  carisoprodol (SOMA) 350 MG tablet, Take 350 mg by mouth every 6 (six) hours as needed., Disp: , Rfl: 0 .  gabapentin (NEURONTIN) 300 MG capsule, Take 1 capsule by mouth 3 (three) times daily., Disp: , Rfl:  .  morphine (MS CONTIN) 15 MG 12 hr tablet, Take 1 tablet by mouth 3 (three) times daily., Disp: , Rfl:  .  omeprazole (PRILOSEC) 20 MG capsule, Take 1 capsule (20 mg total) by mouth daily before breakfast., Disp: 90 capsule, Rfl: 0 .  paliperidone (INVEGA) 3 MG 24 hr tablet, TAKE 1 TABLET BY MOUTH EVERYDAY AT BEDTIME, Disp: , Rfl: 2 .  simvastatin (ZOCOR) 20 MG tablet, Take 1 tablet (20 mg total) by mouth daily at 6 PM., Disp: 30 tablet, Rfl: 10 .  amoxicillin-clavulanate (AUGMENTIN) 875-125 MG tablet, Take 1 tablet by mouth 2 (two) times daily. (Patient not taking:  Reported on 02/09/2018), Disp: 20 tablet, Rfl: 0 .  diphenoxylate-atropine (LOMOTIL) 2.5-0.025 MG tablet, Take 1 tablet by mouth daily as needed for diarrhea or loose stools., Disp: 30 tablet, Rfl: 0 .  predniSONE (DELTASONE) 10 MG tablet, Take 6 tabs day 1, 5 tabs day 2, 4 tabs day 3, 3 tabs day 4, 2 tabs day 5, 1 tab day 6 (Patient not taking: Reported on 02/09/2018), Disp: 21 tablet, Rfl: 0 .  promethazine (PHENERGAN) 25 MG tablet, Take 1 tablet (25 mg total) by mouth every 8 (eight) hours as needed for up to 14 doses for nausea or vomiting. (Patient not taking: Reported on 02/09/2018), Disp: 14 tablet, Rfl: 0   Family History  Problem Relation Age of Onset  . Heart disease Father   . Hyperlipidemia Father   . Alcohol abuse Brother   . Alcohol abuse Paternal Uncle   . Breast cancer Maternal Aunt  70's  . Colon cancer Neg Hx      Social History   Tobacco Use  . Smoking status: Former Smoker    Packs/day: 1.00    Years: 1.00    Pack years: 1.00    Types: Cigarettes  . Smokeless tobacco: Never Used  . Tobacco comment: quit smoking 2016  Substance Use Topics  . Alcohol use: No    Alcohol/week: 0.0 standard drinks    Comment: quit 20 years  . Drug use: Yes    Frequency: 14.0 times per week    Types: Marijuana    Comment: 20 yrs. ago- cocaine     Allergies as of 02/09/2018 - Review Complete 02/09/2018  Allergen Reaction Noted  . Effexor [venlafaxine] Other (See Comments) 11/21/2015  . Lamictal [lamotrigine] Rash 11/21/2015    Review of Systems:    All systems reviewed and negative except where noted in HPI.   Physical Exam:  BP (!) 143/100 (BP Location: Left Arm, Patient Position: Sitting, Cuff Size: Large)   Pulse 62   Resp 16   Ht 5\' 1"  (1.549 m)   Wt 137 lb 9.6 oz (62.4 kg)   BMI 26.00 kg/m  No LMP recorded. Patient is postmenopausal.  General:   Alert,  Well-developed, well-nourished, pleasant and cooperative in NAD Head:  Normocephalic and  atraumatic. Eyes:  Sclera clear, no icterus.   Conjunctiva pink. Ears:  Normal auditory acuity. Nose:  No deformity, discharge, or lesions. Mouth:  No deformity or lesions,oropharynx pink & moist. Neck:  Supple; no masses or thyromegaly. Lungs:  Respirations even and unlabored.  Clear throughout to auscultation.   No wheezes, crackles, or rhonchi. No acute distress. Heart:  Regular rate and rhythm; no murmurs, clicks, rubs, or gallops. Abdomen:  Normal bowel sounds. Soft, Mild epigastric tenderness and non-distended without masses, hepatosplenomegaly or hernias noted.  No guarding or rebound tenderness.   Rectal: Not performed Msk:  Symmetrical without gross deformities. Good, equal movement & strength bilaterally. Pulses:  Normal pulses noted. Extremities:  No clubbing or edema.  No cyanosis. Neurologic:  Alert and oriented x3;  grossly normal neurologically. Skin:  Intact without significant lesions or rashes. No jaundice. Lymph Nodes:  No significant cervical adenopathy. Psych:  Alert and cooperative. Normal mood and affect.  Imaging Studies: reviewed  Assessment and Plan:   [name] is a 52 y.o. Caucasian female with polysubstance abuse, chronic marijuana use, chronic opioid use, bipolar disorder seen for follow-up of worsening of chronic diarrhea, nausea and abdominal cramps.  She was previously seen for intractable nausea and vomiting.  Symptoms are under control with Prilosec and Zofran.   Chronic nonbloody diarrhea with abdominal cramps Most likely functional However, recommend stool studies to rule out infection including C. Difficile Refill Lomotil  Intractable nausea: Psychogenic or secondary to chronic marijuana use -Advised patient to quit smoking marijuana, but she does not want to -H. pylori negative -Continue Zofran 4 mg SL every 6 hours -Continue Prilosec 20 mg once daily -Patient is not willing to try tricyclic antidepressant at this time   Tubular  adenoma: Recommend surveillance colonoscopy in 5 years   Follow up in 3 months   Cephas Darby, MD

## 2018-02-09 NOTE — Progress Notes (Signed)
Subjective:    Patient ID: [name], female    DOB: November 13, 1965, 52 y.o.   MRN: [id number]  HPI  Pt presents to the clinic today with c/o elevated blood pressure. Her BP today at the GI office was 143/100. Her 2 previous blood pressures were 122/68, 120/68. She denies headaches, visual changes, chest pain or shortness of breath. She has never been treated for HTN in the past. Her BP today is 124/68.   She also reports right side facial pain and pressure. She reports this has been ongoing for 6 weeks.She denies nasal congestion, ear pain or sore throat. She is using Flonase daily. She does not smoke.  Review of Systems  Past Medical History:  Diagnosis Date  . Anxiety   . Arthritis    neck, knees, shoulders  . Asthma   . Bipolar disorder ([place])    currently feeling MANIC- 10/02/2016  . Depression   . GERD (gastroesophageal reflux disease)   . History of blood transfusion    as a newborn   . History of lump of left breast   . Hyperlipidemia   . Lumbar pseudoarthrosis   . Motion sickness    cars  . OSA (obstructive sleep apnea) 01/09/2016   can't afford CPAP  . Personality disorder ([place])   . PONV (postoperative nausea and vomiting)   . Post traumatic stress disorder (PTSD)   . Substance abuse ([place])   . Synovial cyst     Current Outpatient Medications  Medication Sig Dispense Refill  . albuterol (PROVENTIL HFA;VENTOLIN HFA) 108 (90 Base) MCG/ACT inhaler Inhale 2 puffs into the lungs every 4 (four) hours as needed for wheezing or shortness of breath. 1 Inhaler 1  . amoxicillin-clavulanate (AUGMENTIN) 875-125 MG tablet Take 1 tablet by mouth 2 (two) times daily. (Patient not taking: Reported on 02/09/2018) 20 tablet 0  . aspirin 81 MG tablet Take 81 mg by mouth daily.    Marland Kitchen buPROPion (WELLBUTRIN XL) 150 MG 24 hr tablet TAKE 1 TABLET BY MOUTH EVERY DAY IN THE MORNING  2  . carisoprodol (SOMA) 350 MG tablet Take 350 mg by mouth every 6 (six) hours as needed.  0  .  diphenoxylate-atropine (LOMOTIL) 2.5-0.025 MG tablet Take 1 tablet by mouth daily as needed for diarrhea or loose stools. 30 tablet 0  . gabapentin (NEURONTIN) 300 MG capsule Take 1 capsule by mouth 3 (three) times daily.    Marland Kitchen morphine (MS CONTIN) 15 MG 12 hr tablet Take 1 tablet by mouth 3 (three) times daily.    Marland Kitchen omeprazole (PRILOSEC) 20 MG capsule Take 1 capsule (20 mg total) by mouth daily before breakfast. 90 capsule 0  . paliperidone (INVEGA) 3 MG 24 hr tablet TAKE 1 TABLET BY MOUTH EVERYDAY AT BEDTIME  2  . predniSONE (DELTASONE) 10 MG tablet Take 6 tabs day 1, 5 tabs day 2, 4 tabs day 3, 3 tabs day 4, 2 tabs day 5, 1 tab day 6 (Patient not taking: Reported on 02/09/2018) 21 tablet 0  . promethazine (PHENERGAN) 25 MG tablet Take 1 tablet (25 mg total) by mouth every 8 (eight) hours as needed for up to 14 doses for nausea or vomiting. (Patient not taking: Reported on 02/09/2018) 14 tablet 0  . simvastatin (ZOCOR) 20 MG tablet Take 1 tablet (20 mg total) by mouth daily at 6 PM. 30 tablet 10   No current facility-administered medications for this visit.     Allergies  Allergen Reactions  . Effexor [  Venlafaxine] Other (See Comments)    UNSPECIFIED REACTION, headaches, felt funny, withdrawal with missed dose   . Lamictal [Lamotrigine] Rash    Family History  Problem Relation Age of Onset  . Heart disease Father   . Hyperlipidemia Father   . Alcohol abuse Brother   . Alcohol abuse Paternal Uncle   . Breast cancer Maternal Aunt        70's  . Colon cancer Neg Hx     Social History   Socioeconomic History  . Marital status: Single    Spouse name: Not on file  . Number of children: Not on file  . Years of education: Not on file  . Highest education level: Not on file  Occupational History  . Not on file  Social Needs  . Financial resource strain: Not on file  . Food insecurity:    Worry: Not on file    Inability: Not on file  . Transportation needs:    Medical: Not on  file    Non-medical: Not on file  Tobacco Use  . Smoking status: Former Smoker    Packs/day: 1.00    Years: 1.00    Pack years: 1.00    Types: Cigarettes  . Smokeless tobacco: Never Used  . Tobacco comment: quit smoking 2016  Substance and Sexual Activity  . Alcohol use: No    Alcohol/week: 0.0 standard drinks    Comment: quit 20 years  . Drug use: Yes    Frequency: 14.0 times per week    Types: Marijuana    Comment: 20 yrs. ago- cocaine   . Sexual activity: Not Currently  Lifestyle  . Physical activity:    Days per week: Not on file    Minutes per session: Not on file  . Stress: Not on file  Relationships  . Social connections:    Talks on phone: Not on file    Gets together: Not on file    Attends religious service: Not on file    Active member of club or organization: Not on file    Attends meetings of clubs or organizations: Not on file    Relationship status: Not on file  . Intimate partner violence:    Fear of current or ex partner: Not on file    Emotionally abused: Not on file    Physically abused: Not on file    Forced sexual activity: Not on file  Other Topics Concern  . Not on file  Social History Narrative  . Not on file     Constitutional: Denies fever, malaise, fatigue, headache or abrupt weight changes.  HEENT: Pt reports facial pain and pressure. Denies eye pain, eye redness, ear pain, ringing in the ears, wax buildup, runny nose, nasal congestion, bloody nose, or sore throat. Respiratory: Denies difficulty breathing, shortness of breath, cough or sputum production.   Cardiovascular: Denies chest pain, chest tightness, palpitations or swelling in the hands or feet.  Neurological: Denies dizziness, difficulty with memory, difficulty with speech or problems with balance and coordination.    No other specific complaints in a complete review of systems (except as listed in HPI above).     Objective:   Physical Exam   BP 124/68 (BP Location: Left  Arm, Patient Position: Sitting, Cuff Size: Normal)   Pulse 72   Temp 97.9 F (36.6 C) (Oral)   Wt 138 lb (62.6 kg)   SpO2 97%   BMI 26.07 kg/m  Wt Readings from Last 3 Encounters:  02/09/18 138 lb (62.6 kg)  02/09/18 137 lb 9.6 oz (62.4 kg)  12/31/17 140 lb (63.5 kg)    General: Appears her stated age, well developed, well nourished in NAD. HEENT: Head: normal shape and size, right maxillary sinus tenderness noted; Ears: Tm's gray and intact, normal light reflex; Nose: mucosa pink and moist, septum midline;  Cardiovascular: Normal rate and rhythm. S1,S2 noted.  No murmur, rubs or gallops noted.  Pulmonary/Chest: Normal effort and positive vesicular breath sounds. No respiratory distress. No wheezes, rales or ronchi noted.  Neurological: Alert and oriented.    BMET    Component Value Date/Time   NA 133 (L) 09/10/2017 1024   K 4.5 09/10/2017 1024   CL 99 09/10/2017 1024   CO2 28 09/10/2017 1024   GLUCOSE 71 09/10/2017 1024   BUN 8 09/10/2017 1024   CREATININE 0.71 09/10/2017 1024   CALCIUM 9.1 09/10/2017 1024   GFRNONAA >60 10/28/2016 0659   GFRAA >60 10/28/2016 0659    Lipid Panel     Component Value Date/Time   CHOL 168 05/21/2017 1422   TRIG 137.0 05/21/2017 1422   HDL 65.70 05/21/2017 1422   CHOLHDL 3 05/21/2017 1422   VLDL 27.4 05/21/2017 1422   LDLCALC 75 05/21/2017 1422    CBC    Component Value Date/Time   WBC 7.1 09/10/2017 1024   RBC 4.21 09/10/2017 1024   HGB 13.5 09/10/2017 1024   HCT 39.4 09/10/2017 1024   PLT 344.0 09/10/2017 1024   MCV 93.7 09/10/2017 1024   MCH 30.9 10/28/2016 0659   MCHC 34.4 09/10/2017 1024   RDW 14.6 09/10/2017 1024   LYMPHSABS 2.1 10/02/2016 0945   MONOABS 0.4 10/02/2016 0945   EOSABS 0.2 10/02/2016 0945   BASOSABS 0.0 10/02/2016 0945    Hgb A1C No results found for: HGBA1C        Assessment & Plan:   Transient Elevated Blood Pressure:  Looks good today Will continue to monitor Stop smoking  Reinforced  DASH diet and increase aerobic exercise  Facial Pain and Pressure:  Continue Flonase Offered referral to ENT but she declines  Return precautions discussed Webb Silversmith, NP

## 2018-02-09 NOTE — Telephone Encounter (Signed)
Patient LVM needs refill on her Zofran, please call into [facility].

## 2018-02-09 NOTE — Patient Instructions (Signed)
Blood Pressure Record Sheet Your blood pressure on this visit to the emergency department or clinic is elevated. This does not necessarily mean you have high blood pressure (hypertension), but it does mean that your blood pressure needs to be rechecked. Many times your blood pressure can increase due to illness, pain, anxiety, or other factors. We recommend that you get a series of blood pressure readings done over a period of 5 days. It is best to get a reading in the morning and one in the evening. You should make sure to sit and relax for 1-5 minutes before the reading is taken. Write the readings down and make a follow-up appointment with your health care provider to discuss the results. If there is not a free clinic or a drug store with a blood-pressure-taking machine near you, you can purchase blood-pressure-taking equipment from a drug store. Having one in the home allows you the convenience of taking your blood pressure while you are home and relaxed. Blood Pressure Log Date: _______________________  a.m. _____________________  p.m. _____________________  Date: _______________________  a.m. _____________________  p.m. _____________________  Date: _______________________  a.m. _____________________  p.m. _____________________  Date: _______________________  a.m. _____________________  p.m. _____________________  Date: _______________________  a.m. _____________________  p.m. _____________________  This information is not intended to replace advice given to you by your health care provider. Make sure you discuss any questions you have with your health care provider. Document Released: 11/16/2002 Document Revised: 02/01/2016 Document Reviewed: 04/12/2013 Elsevier Interactive Patient Education  2018 Elsevier Inc.  

## 2018-02-10 NOTE — Telephone Encounter (Signed)
Medication has been refill, pt has been notified

## 2018-02-12 ENCOUNTER — Other Ambulatory Visit: Payer: Self-pay | Admitting: Internal Medicine

## 2018-03-16 ENCOUNTER — Encounter: Payer: Self-pay | Admitting: Internal Medicine

## 2018-03-16 ENCOUNTER — Ambulatory Visit (INDEPENDENT_AMBULATORY_CARE_PROVIDER_SITE_OTHER): Payer: Medicare HMO | Admitting: Internal Medicine

## 2018-03-16 VITALS — BP 122/72 | HR 81 | Temp 97.9°F | Wt 137.0 lb

## 2018-03-16 DIAGNOSIS — M25522 Pain in left elbow: Secondary | ICD-10-CM

## 2018-03-16 DIAGNOSIS — L989 Disorder of the skin and subcutaneous tissue, unspecified: Secondary | ICD-10-CM | POA: Diagnosis not present

## 2018-03-16 MED ORDER — PREDNISONE 10 MG PO TABS: ORAL_TABLET | ORAL | 0 refills | Status: DC

## 2018-03-16 NOTE — Patient Instructions (Signed)
Excision of Skin Lesions, Care After  This sheet gives you information about how to care for yourself after your procedure. Your health care provider may also give you more specific instructions. If you have problems or questions, contact your health care provider.  What can I expect after the procedure?  After your procedure, it is common to have pain or discomfort at the excision site.  Follow these instructions at home:  Excision care       · Follow instructions from your health care provider about how to take care of your excision site. Make sure you:  ? Wash your hands with soap and water before and after you change your bandage (dressing). If soap and water are not available, use hand sanitizer.  ? Change your dressing as told by your health care provider.  ? Leave stitches (sutures), skin glue, or adhesive strips in place. These skin closures may need to stay in place for 2 weeks or longer. If adhesive strip edges start to loosen and curl up, you may trim the loose edges. Do not remove adhesive strips completely unless your health care provider tells you to do that.  · Check the excision area every day for signs of infection. Watch for:  ? Redness, swelling, or pain.  ? Fluid or blood.  ? Warmth.  ? Pus or a bad smell.  · Keep the site clean, dry, and protected for at least 48 hours.  · For bleeding, apply gentle but firm pressure to the area using a folded towel for 20 minutes.  · Avoid high-impact exercise and activities until the sutures are removed or the area heals.  General instructions  · Take over-the-counter and prescription medicines only as told by your health care provider.  · Follow instructions from your health care provider about how to minimize scarring. Scarring should lessen over time.  · Avoid sun exposure until the area has healed. Use sunscreen to protect the area from the sun after it has healed.  · Keep all follow-up visits as told by your health care provider. This is  important.  Contact a health care provider if:  · You have redness, swelling, or pain around your excision site.  · You have fluid or blood coming from your excision site.  · Your excision site feels warm to the touch.  · You have pus or a bad smell coming from your excision site.  · You have a fever.  · You have pain that does not improve in 2-3 days after your procedure.  · You notice skin irregularities or changes in how you feel (sensation).  Summary  · This sheet of instructions provides you with information about caring for yourself after your procedure. Contact your health care provider if you have any problems or questions.  · Take over-the-counter and prescription medicines only as told by your health care provider.  · Change your dressing as told by your health care provider.  · Contact a health care provider if you have redness, swelling, pain, or other signs of infection around your excision site.  · Keep all follow-up visits as told by your health care provider. This is important.  This information is not intended to replace advice given to you by your health care provider. Make sure you discuss any questions you have with your health care provider.  Document Released: 07/04/2014 Document Revised: 08/26/2017 Document Reviewed: 08/26/2017  Elsevier Interactive Patient Education © 2019 Elsevier Inc.   

## 2018-03-17 ENCOUNTER — Encounter: Payer: Self-pay | Admitting: Internal Medicine

## 2018-03-17 NOTE — Progress Notes (Signed)
Subjective:    Patient ID: [name], female    DOB: November 03, 1965, 53 y.o.   MRN: [id number]  HPI  Patient presents to the clinic today with complaint of left elbow pain.  She reports this started 2 weeks ago.  She describes the pain is achy and throbbing.  The pain does not radiate.  She has noticed some associated swelling.  She thinks she hit her left elbow a couple weeks ago but does not recall for sure.  She denies numbness, tingling or weakness of left arm.  She has taken her narcotic pain medication with minimal relief.  She would also like referral to dermatology at this time.  She reports she has a skin lesion on her right forehead and her left lower lip that she would like to have checked.  Review of Systems  Past Medical History:  Diagnosis Date  . Anxiety   . Arthritis    neck, knees, shoulders  . Asthma   . Bipolar disorder ([place])    currently feeling MANIC- 10/02/2016  . Depression   . GERD (gastroesophageal reflux disease)   . History of blood transfusion    as a newborn   . History of lump of left breast   . Hyperlipidemia   . Lumbar pseudoarthrosis   . Motion sickness    cars  . OSA (obstructive sleep apnea) 01/09/2016   can't afford CPAP  . Personality disorder ([place])   . PONV (postoperative nausea and vomiting)   . Post traumatic stress disorder (PTSD)   . Substance abuse ([place])   . Synovial cyst     Current Outpatient Medications  Medication Sig Dispense Refill  . albuterol (PROVENTIL HFA;VENTOLIN HFA) 108 (90 Base) MCG/ACT inhaler Inhale 2 puffs into the lungs every 4 (four) hours as needed for wheezing or shortness of breath. 1 Inhaler 1  . aspirin 81 MG tablet Take 81 mg by mouth daily.    Marland Kitchen buPROPion (WELLBUTRIN XL) 150 MG 24 hr tablet TAKE 1 TABLET BY MOUTH EVERY DAY IN THE MORNING  2  . carisoprodol (SOMA) 350 MG tablet Take 350 mg by mouth every 6 (six) hours as needed.  0  . diphenoxylate-atropine (LOMOTIL) 2.5-0.025 MG tablet Take 1 tablet by  mouth daily as needed for diarrhea or loose stools. 30 tablet 0  . gabapentin (NEURONTIN) 300 MG capsule Take 1 capsule by mouth 3 (three) times daily.    Marland Kitchen morphine (MS CONTIN) 15 MG 12 hr tablet Take 1 tablet by mouth 3 (three) times daily.    Marland Kitchen omeprazole (PRILOSEC) 20 MG capsule Take 1 capsule (20 mg total) by mouth daily before breakfast. 90 capsule 0  . paliperidone (INVEGA) 3 MG 24 hr tablet TAKE 1 TABLET BY MOUTH EVERYDAY AT BEDTIME  2  . promethazine (PHENERGAN) 25 MG tablet Take 1 tablet (25 mg total) by mouth every 8 (eight) hours as needed for up to 14 doses for nausea or vomiting. 14 tablet 0  . simvastatin (ZOCOR) 20 MG tablet TAKE 1 TABLET (20 MG TOTAL) BY MOUTH DAILY AT 6 PM. 90 tablet 0  . predniSONE (DELTASONE) 10 MG tablet Take 3 tabs on days 1-3, take 2 tabs on days 4-6, take 1 tab on days 7-9 18 tablet 0   No current facility-administered medications for this visit.     Allergies  Allergen Reactions  . Effexor [Venlafaxine] Other (See Comments)    UNSPECIFIED REACTION, headaches, felt funny, withdrawal with missed dose   .  Lamictal [Lamotrigine] Rash    Family History  Problem Relation Age of Onset  . Heart disease Father   . Hyperlipidemia Father   . Alcohol abuse Brother   . Alcohol abuse Paternal Uncle   . Breast cancer Maternal Aunt        70's  . Colon cancer Neg Hx     Social History   Socioeconomic History  . Marital status: Single    Spouse name: Not on file  . Number of children: Not on file  . Years of education: Not on file  . Highest education level: Not on file  Occupational History  . Not on file  Social Needs  . Financial resource strain: Not on file  . Food insecurity:    Worry: Not on file    Inability: Not on file  . Transportation needs:    Medical: Not on file    Non-medical: Not on file  Tobacco Use  . Smoking status: Former Smoker    Packs/day: 1.00    Years: 1.00    Pack years: 1.00    Types: Cigarettes  . Smokeless  tobacco: Never Used  . Tobacco comment: quit smoking 2016  Substance and Sexual Activity  . Alcohol use: No    Alcohol/week: 0.0 standard drinks    Comment: quit 20 years  . Drug use: Yes    Frequency: 14.0 times per week    Types: Marijuana    Comment: 20 yrs. ago- cocaine   . Sexual activity: Not Currently  Lifestyle  . Physical activity:    Days per week: Not on file    Minutes per session: Not on file  . Stress: Not on file  Relationships  . Social connections:    Talks on phone: Not on file    Gets together: Not on file    Attends religious service: Not on file    Active member of club or organization: Not on file    Attends meetings of clubs or organizations: Not on file    Relationship status: Not on file  . Intimate partner violence:    Fear of current or ex partner: Not on file    Emotionally abused: Not on file    Physically abused: Not on file    Forced sexual activity: Not on file  Other Topics Concern  . Not on file  Social History Narrative  . Not on file     Constitutional: Denies fever, malaise, fatigue, headache or abrupt weight changes.  Musculoskeletal: Patient reports left elbow pain and swelling.  Denies decrease in range of motion, difficulty with gait, muscle pain.  Skin: Patient reports skin lesion of the right forehead and left lower lip.  Denies redness, rashes, or ulcercations.  Neurological: Denies numbness, tingling, weakness or problems with coordination.    No other specific complaints in a complete review of systems (except as listed in HPI above).     Objective:   Physical Exam    BP 122/72   Pulse 81   Temp 97.9 F (36.6 C) (Oral)   Wt 137 lb (62.1 kg)   SpO2 97%   BMI 25.89 kg/m  Wt Readings from Last 3 Encounters:  03/16/18 137 lb (62.1 kg)  02/09/18 138 lb (62.6 kg)  02/09/18 137 lb 9.6 oz (62.4 kg)    General: Appears her stated age, well developed, well nourished in NAD. Skin: 0.5 cm oval-shaped scaly lesion noted  of right forehead, appears to be a seborrheic keratosis.  0.2 5 cm round lesion noted of the left lowe lip. Musculoskeletal: Normal flexion and extension of the left elbow.  Pain with rotation of the left elbow.  No joint swelling noted.  Pain with palpation noted superior to the medial epicondyle.  Strength 5/5 BUE.  Handgrips equal. Neurological: Alert and oriented.  Sensation intact to BUE.   BMET    Component Value Date/Time   NA 133 (L) 09/10/2017 1024   K 4.5 09/10/2017 1024   CL 99 09/10/2017 1024   CO2 28 09/10/2017 1024   GLUCOSE 71 09/10/2017 1024   BUN 8 09/10/2017 1024   CREATININE 0.71 09/10/2017 1024   CALCIUM 9.1 09/10/2017 1024   GFRNONAA >60 10/28/2016 0659   GFRAA >60 10/28/2016 0659    Lipid Panel     Component Value Date/Time   CHOL 168 05/21/2017 1422   TRIG 137.0 05/21/2017 1422   HDL 65.70 05/21/2017 1422   CHOLHDL 3 05/21/2017 1422   VLDL 27.4 05/21/2017 1422   LDLCALC 75 05/21/2017 1422    CBC    Component Value Date/Time   WBC 7.1 09/10/2017 1024   RBC 4.21 09/10/2017 1024   HGB 13.5 09/10/2017 1024   HCT 39.4 09/10/2017 1024   PLT 344.0 09/10/2017 1024   MCV 93.7 09/10/2017 1024   MCH 30.9 10/28/2016 0659   MCHC 34.4 09/10/2017 1024   RDW 14.6 09/10/2017 1024   LYMPHSABS 2.1 10/02/2016 0945   MONOABS 0.4 10/02/2016 0945   EOSABS 0.2 10/02/2016 0945   BASOSABS 0.0 10/02/2016 0945    Hgb A1C No results found for: HGBA1C        Assessment & Plan:   Left Elbow Pain:  Concerning for tendinitis Discussed avoiding overuse Rx for prednisone x 9 days Avoid OTC NSAIDs Ice may be helpful No indication for x-ray at this time  Skin Lesion of Forehead, Skin Lesion of Lip:  Referral placed to dermatology for further evaluation and treatment  RTC if symptoms persist or worsen Webb Silversmith, NP

## 2018-03-23 ENCOUNTER — Other Ambulatory Visit: Payer: Self-pay | Admitting: Gastroenterology

## 2018-03-23 DIAGNOSIS — R112 Nausea with vomiting, unspecified: Secondary | ICD-10-CM

## 2018-03-30 ENCOUNTER — Ambulatory Visit (INDEPENDENT_AMBULATORY_CARE_PROVIDER_SITE_OTHER)
Admission: RE | Admit: 2018-03-30 | Discharge: 2018-03-30 | Disposition: A | Discharge status: Home / Self Care | Payer: Medicare HMO | Source: Ambulatory Visit | Attending: Internal Medicine | Admitting: Internal Medicine

## 2018-03-30 ENCOUNTER — Encounter: Payer: Self-pay | Admitting: Internal Medicine

## 2018-03-30 ENCOUNTER — Ambulatory Visit (INDEPENDENT_AMBULATORY_CARE_PROVIDER_SITE_OTHER): Payer: Medicare HMO | Admitting: Internal Medicine

## 2018-03-30 VITALS — BP 122/82 | HR 69 | Temp 97.9°F | Wt 135.0 lb

## 2018-03-30 DIAGNOSIS — G4701 Insomnia due to medical condition: Secondary | ICD-10-CM | POA: Diagnosis not present

## 2018-03-30 DIAGNOSIS — G4731 Primary central sleep apnea: Secondary | ICD-10-CM | POA: Diagnosis not present

## 2018-03-30 DIAGNOSIS — M25522 Pain in left elbow: Secondary | ICD-10-CM | POA: Diagnosis not present

## 2018-03-30 MED ORDER — TRAZODONE HCL 50 MG PO TABS: 25.0000 mg | ORAL_TABLET | Freq: Every evening | ORAL | 2 refills | Status: DC | PRN

## 2018-03-30 NOTE — Progress Notes (Signed)
Subjective:    Patient ID: [name], female    DOB: 1965-06-04, 53 y.o.   MRN: [id number]  HPI  Pt presents to the clinic today with c/o left elbow pain. She was seen for the same 03/16/18. She was treated Prednisone which she reports provided good relief while she was on it, but symptoms returned shortly after stopping the medication. She has not had an injury to the area. She denies numbness, tingling or weakness. She has not taken anything OTC.  She also reports she would like something to help her sleep. She has trouble falling asleep and staying asleep. She reports she has a history of sleep apnea, but never got her CPAP machine. She has taken Seroquel in the past with good relief but she doesn't want to have to take something that strong. She would also like referral back to pulmonology to see if she can get a CPAP machine.  Review of Systems  Past Medical History:  Diagnosis Date  . Anxiety   . Arthritis    neck, knees, shoulders  . Asthma   . Bipolar disorder ([place])    currently feeling MANIC- 10/02/2016  . Depression   . GERD (gastroesophageal reflux disease)   . History of blood transfusion    as a newborn   . History of lump of left breast   . Hyperlipidemia   . Lumbar pseudoarthrosis   . Motion sickness    cars  . OSA (obstructive sleep apnea) 01/09/2016   can't afford CPAP  . Personality disorder ([place])   . PONV (postoperative nausea and vomiting)   . Post traumatic stress disorder (PTSD)   . Substance abuse ([place])   . Synovial cyst     Current Outpatient Medications  Medication Sig Dispense Refill  . albuterol (PROVENTIL HFA;VENTOLIN HFA) 108 (90 Base) MCG/ACT inhaler Inhale 2 puffs into the lungs every 4 (four) hours as needed for wheezing or shortness of breath. 1 Inhaler 1  . aspirin 81 MG tablet Take 81 mg by mouth daily.    Marland Kitchen buPROPion (WELLBUTRIN XL) 150 MG 24 hr tablet TAKE 1 TABLET BY MOUTH EVERY DAY IN THE MORNING  2  . carisoprodol (SOMA) 350 MG  tablet Take 350 mg by mouth every 6 (six) hours as needed.  0  . diphenoxylate-atropine (LOMOTIL) 2.5-0.025 MG tablet Take 1 tablet by mouth daily as needed for diarrhea or loose stools. 30 tablet 0  . gabapentin (NEURONTIN) 300 MG capsule Take 1 capsule by mouth 3 (three) times daily.    Marland Kitchen morphine (MS CONTIN) 15 MG 12 hr tablet Take 1 tablet by mouth 3 (three) times daily.    . ondansetron (ZOFRAN) 4 MG tablet TAKE 1 TABLET (4 MG TOTAL) BY MOUTH 2 (TWO) TIMES DAILY. 60 tablet 0  . paliperidone (INVEGA) 3 MG 24 hr tablet TAKE 1 TABLET BY MOUTH EVERYDAY AT BEDTIME  2  . promethazine (PHENERGAN) 25 MG tablet Take 1 tablet (25 mg total) by mouth every 8 (eight) hours as needed for up to 14 doses for nausea or vomiting. 14 tablet 0  . simvastatin (ZOCOR) 20 MG tablet TAKE 1 TABLET (20 MG TOTAL) BY MOUTH DAILY AT 6 PM. 90 tablet 0  . omeprazole (PRILOSEC) 20 MG capsule Take 1 capsule (20 mg total) by mouth daily before breakfast. 90 capsule 0  . traZODone (DESYREL) 50 MG tablet Take 0.5 tablets (25 mg total) by mouth at bedtime as needed for sleep. 30 tablet 2  No current facility-administered medications for this visit.     Allergies  Allergen Reactions  . Effexor [Venlafaxine] Other (See Comments)    UNSPECIFIED REACTION, headaches, felt funny, withdrawal with missed dose   . Lamictal [Lamotrigine] Rash    Family History  Problem Relation Age of Onset  . Heart disease Father   . Hyperlipidemia Father   . Alcohol abuse Brother   . Alcohol abuse Paternal Uncle   . Breast cancer Maternal Aunt        70's  . Colon cancer Neg Hx     Social History   Socioeconomic History  . Marital status: Single    Spouse name: Not on file  . Number of children: Not on file  . Years of education: Not on file  . Highest education level: Not on file  Occupational History  . Not on file  Social Needs  . Financial resource strain: Not on file  . Food insecurity:    Worry: Not on file     Inability: Not on file  . Transportation needs:    Medical: Not on file    Non-medical: Not on file  Tobacco Use  . Smoking status: Former Smoker    Packs/day: 1.00    Years: 1.00    Pack years: 1.00    Types: Cigarettes  . Smokeless tobacco: Never Used  . Tobacco comment: quit smoking 2016  Substance and Sexual Activity  . Alcohol use: No    Alcohol/week: 0.0 standard drinks    Comment: quit 20 years  . Drug use: Yes    Frequency: 14.0 times per week    Types: Marijuana    Comment: 20 yrs. ago- cocaine   . Sexual activity: Not Currently  Lifestyle  . Physical activity:    Days per week: Not on file    Minutes per session: Not on file  . Stress: Not on file  Relationships  . Social connections:    Talks on phone: Not on file    Gets together: Not on file    Attends religious service: Not on file    Active member of club or organization: Not on file    Attends meetings of clubs or organizations: Not on file    Relationship status: Not on file  . Intimate partner violence:    Fear of current or ex partner: Not on file    Emotionally abused: Not on file    Physically abused: Not on file    Forced sexual activity: Not on file  Other Topics Concern  . Not on file  Social History Narrative  . Not on file     Constitutional: Denies fever, malaise, fatigue, headache or abrupt weight changes.  Respiratory: Denies difficulty breathing, shortness of breath, cough or sputum production.   Cardiovascular: Denies chest pain, chest tightness, palpitations or swelling in the hands or feet.  Musculoskeletal: Pt reports left elbow pain. Denies decrease in range of motion, difficulty with gait, muscle pain or joint swelling.  Neurological: Pt reports insomnia. Denies dizziness, difficulty with memory, difficulty with speech or problems with balance and coordination.    No other specific complaints in a complete review of systems (except as listed in HPI above).     Objective:    Physical Exam  BP 122/82   Pulse 69   Temp 97.9 F (36.6 C) (Oral)   Wt 135 lb (61.2 kg)   SpO2 97%   BMI 25.51 kg/m  Wt Readings from Last 3  Encounters:  03/30/18 135 lb (61.2 kg)  03/16/18 137 lb (62.1 kg)  02/09/18 138 lb (62.6 kg)    General: Appears her stated age, well developed, well nourished in NAD. Cardiovascular: Normal rate and rhythm. S1,S2 noted.  No murmur, rubs or gallops noted. Radial pulses 2+ Pulmonary/Chest: Normal effort and positive vesicular breath sounds. No respiratory distress. No wheezes, rales or ronchi noted.  AMusculoskeletal: Normal flexion, extension and rotation of the right wrist. Pain with palpation inferior and lateral to the medical olecranon. Hand grips equal.  Neurological: Alert and oriented. Sensation intact to BUE.   BMET    Component Value Date/Time   NA 133 (L) 09/10/2017 1024   K 4.5 09/10/2017 1024   CL 99 09/10/2017 1024   CO2 28 09/10/2017 1024   GLUCOSE 71 09/10/2017 1024   BUN 8 09/10/2017 1024   CREATININE 0.71 09/10/2017 1024   CALCIUM 9.1 09/10/2017 1024   GFRNONAA >60 10/28/2016 0659   GFRAA >60 10/28/2016 0659    Lipid Panel     Component Value Date/Time   CHOL 168 05/21/2017 1422   TRIG 137.0 05/21/2017 1422   HDL 65.70 05/21/2017 1422   CHOLHDL 3 05/21/2017 1422   VLDL 27.4 05/21/2017 1422   LDLCALC 75 05/21/2017 1422    CBC    Component Value Date/Time   WBC 7.1 09/10/2017 1024   RBC 4.21 09/10/2017 1024   HGB 13.5 09/10/2017 1024   HCT 39.4 09/10/2017 1024   PLT 344.0 09/10/2017 1024   MCV 93.7 09/10/2017 1024   MCH 30.9 10/28/2016 0659   MCHC 34.4 09/10/2017 1024   RDW 14.6 09/10/2017 1024   LYMPHSABS 2.1 10/02/2016 0945   MONOABS 0.4 10/02/2016 0945   EOSABS 0.2 10/02/2016 0945   BASOSABS 0.0 10/02/2016 0945    Hgb A1C No results found for: HGBA1C          Assessment & Plan:   Left Elbow Pain:  Failed OTC steroids Xray left elbow today She can get a sling from pharmacy to  wear for comfort Advised her to make an appt with Dr. Lorelei Pont for further evaluation  Insomnia, OSA:  RX for Trazadone 25 mg QHS prn Referral placed to pulmonology for further treatment of OSA  Return precautions discussed, will follow up after xray Webb Silversmith, NP

## 2018-03-30 NOTE — Patient Instructions (Signed)
Elbow and Forearm Exercises  Ask your health care provider which exercises are safe for you. Do exercises exactly as told by your health care provider and adjust them as directed. It is normal to feel mild stretching, pulling, tightness, or discomfort as you do these exercises, but you should stop right away if you feel sudden pain or your pain gets worse.Do not begin these exercises until told by your health care provider.  Range of motion exercises  These exercises warm up your muscles and joints and improve the movement and flexibility of your injured elbow and forearm. These exercises also help to relieve pain, numbness, and tingling. These exercises are done using the muscles in your injured elbow and forearm.  Exercise A: Elbow flexion, active  1. Hold your left / right arm at your side, and bend your elbow as far as you can using your left / right arm muscles.  2. Hold this position for __________ seconds.  3. Slowly return to the starting position.  Repeat __________ times. Complete this exercise __________ times a day.  Exercise B: Elbow extension, active  1. Hold your left / right arm at your side, and straighten your elbow as much as you can using your left / right arm muscles.  2. Hold this position for __________ seconds.  3. Slowly return to the starting position.  Repeat __________ times. Complete this exercise __________ times a day.  Exercise C: Forearm rotation, supination, active  1. Stand or sit with your elbows at your sides.  2. Bend your left / right elbow to an "L" shape (90 degrees).  3. Turn your palm upward until you feel a gentle stretch on the inside of your forearm.  4. Hold this position for __________ seconds.  5. Slowly release and return to the starting position.  Repeat __________ times. Complete this exercise __________ times a day.  Exercise D: Forearm rotation, pronation, active  1. Stand or sit with your elbows at your side.  2. Bend your left / right elbow to an "L" shape (90  degrees).  3. Turn your left / right palm downward until you feel a gentle stretch on the top of your forearm.  4. Hold this position for __________ seconds.  5. Slowly release and return to the starting position.  Repeat__________ times. Complete this exercise __________ times a day.  Stretching exercises  These exercises warm up your muscles and joints and improve the movement and flexibility of your injured elbow and forearm. These exercises also help to relieve pain, numbness, and tingling.These exercises are done using your healthy elbow and forearm to help stretch the muscles in your injured elbow and forearm.  Exercise E: Elbow flexion, active-assisted    1. Hold your left / right arm at your side, and bend your elbow as much as you can using your left / right arm muscles.  2. Use your other hand to bend your left / right elbow farther. To do this, gently push up on your forearm until you feel a gentle stretch on the back of your elbow.  3. Hold this position for __________ seconds.  4. Slowly return to the starting position.  Repeat __________ times. Complete this exercise __________ times a day.  Exercise F: Elbow extension, active-assisted    1. Hold your left / right arm at your side, and straighten your elbow as much as you can using your left / right arm muscles.  2. Use your other hand to straighten the left /   right elbow farther. To do this, gently push down on your forearm until you feel a gentle stretch on the inside of your elbow.  3. Hold this position for __________ seconds.  4. Slowly return to the starting position.  Repeat __________ times. Complete this exercise __________ times a day.  Exercise G: Forearm rotation, supination, active-assisted    1. Sit with your left / right elbow bent in an "L" shape (90 degrees) with your forearm resting on a table.  2. Keeping your upper body and shoulder still, rotate your forearm so your left / right palm faces upward.  3. Use your other hand to help  rotate your forearm further until you feel a gentle to moderate stretch.  4. Hold this position for __________ seconds.  5. Slowly release the stretch and return to the starting position.  Repeat __________ times. Complete this exercise __________ times a day.  Exercise H: Forearm rotation, pronation, active-assisted    1. Sit with your left / right elbow bent in an "L" shape (90 degrees) with your forearm resting on a table.  2. Keeping your upper body and shoulder still, rotate your forearm so your palm faces the tabletop.  3. Use your other hand to help rotate your forearm further until you feel a gentle to moderate stretch.  4. Hold this position for __________ seconds.  5. Slowly release the stretch and return to the starting position.  Repeat __________ times. Complete this exercise __________ times a day.  Exercise I: Elbow flexion, supine, passive  1. Lie on your back.  2. Extend your left / right arm up in the air, bracing it with your other hand.  3. Let your left / right your hand slowly lower toward your shoulder, while your elbow stays pointed toward the ceiling. You should feel a gentle stretch along the back of your upper arm and elbow.  4. If instructed by your health care provider, you may increase the intensity of your stretch by adding a small wrist weight or hand weight.  5. Hold this position for __________ seconds.  6. Slowly return to the starting position.  Repeat __________ times. Complete this exercise __________ times a day.  Exercise J: Elbow extension, supine, passive    1. Lie on your back. Make sure that you are in a comfortable position that lets you relax your arm muscles.  2. Place a folded towel under your left / right upper arm so your elbow and shoulder are at the same height. Straighten your left / right arm so your elbow does not rest on the bed or towel.  3. Let the weight of your hand stretch your elbow. Keep your arm and chest muscles relaxed. You should feel a stretch on  the inside of your elbow.  4. If told by your health care provider, you may increase the intensity of your stretch by adding a small wrist weight or hand weight.  5. Hold this position for__________ seconds.  6. Slowly release the stretch.  Repeat __________ times. Complete this exercise __________ times a day.  Strengthening exercises  These exercises build strength and endurance in your elbow and forearm. Endurance is the ability to use your muscles for a long time, even after they get tired.  Exercise K: Elbow flexion, isometric    1. Stand or sit up straight.  2. Bend your left / right elbow in an "L" shape (90 degrees) and turn your palm up so your forearm is at the   height of your waist.  3. Place your other hand on top of your forearm. Gently push down as your left / right arm resists. Push as hard as you can with both arms without causing any pain or movement at your left / right elbow.  4. Hold this position for __________ seconds.  5. Slowly release the tension in both arms. Let your muscles relax completely before repeating.  Repeat __________ times. Complete this exercise __________ times a day.  Exercise L: Elbow extensors, isometric    1. Stand or sit up straight.  2. Place your left / right arm so your palm faces your abdomen and it is at the height of your waist.  3. Place your other hand on the underside of your forearm. Gently push up as your left / right arm resists. Push as hard as you can with both arms, without causing any pain or movement at your left / right elbow.  4. Hold this position for __________ seconds.  5. Slowly release the tension in both arms. Let your muscles relax completely before repeating.  Repeat __________ times. Complete this exercise __________ times a day.  Exercise M: Elbow flexion with forearm palm up    1. Sit upright on a firm chair without armrests, or stand.  2. Place your left / right arm at your side with your palm facing forward.  3. Holding a __________weight  or gripping a rubber exercise band or tubing, bend your elbow to bring your hand toward your shoulder.  4. Hold this position for __________ seconds.  5. Slowly return to the starting position.  Repeat __________times. Complete this exercise __________times a day.  Exercise N: Elbow extension    1. Sit on a firm chair without armrests, or stand.  2. Keeping your upper arms at your sides, bring both hands up toward your left / right shoulder while you grip a rubber exercise band or tubing. Your left / right hand should be just below the other hand.  3. Straighten your left / right elbow.  4. Hold this position for __________ seconds.  5. Control the resistance of the band or tubing as your hand returns to your side.  Repeat __________times. Complete this exercise __________times a day.  Exercise O: Forearm rotation, supination    1. Sit with your left / right forearm supported on a table. Keep your elbow at waist height.  2. Rest your hand over the edge of the table with your palm facing down.  3. Gently hold a lightweight hammer.  4. Without moving your elbow, slowly rotate your forearm to turn your palm and hand upward to a "thumbs-up" position.  5. Hold this position for __________ seconds.  6. Slowly return to the starting position.  Repeat __________times. Complete this exercise __________times a day.  Exercise P: Forearm rotation, pronation    1. Sit with your left / right forearm supported on a table. Keep your elbow below shoulder height.  2. Rest your hand over the edge of the table with your palm facing up.  3. Gently hold a lightweight hammer.  4. Without moving your elbow, slowly rotate your forearm to turn your palm and hand upward to a "thumbs-up" position.  5. Hold this position for __________seconds.  6. Slowly return to the starting position.  Repeat __________times. Complete this exercise __________times a day.  This information is not intended to replace advice given to you by your health care  provider. Make sure you discuss any questions   you have with your health care provider.  Document Released: 01/01/2005 Document Revised: 06/23/2017 Document Reviewed: 11/12/2014  Elsevier Interactive Patient Education  2019 [employer]

## 2018-04-04 NOTE — Progress Notes (Deleted)
Dr. Frederico Hamman T. Burdette Forehand, MD, [facility] [place], [address] Phone: [phone] Fax: [phone]  04/05/2018  Patient: [name], MRN: [id number], DOB: May 22, 1965, 53 y.o.  Primary Physician:  Jearld Fenton, NP   No chief complaint on file.  Subjective:   [name] is a 53 y.o. very pleasant female patient who presents with the following:  53 year old patient with history significant for bipolar illness as well as PTSD and chart reports substance abuse in past who saw Mrs. Baity 1/14 and 03/30/2018 with some elbow pain and other symptoms.  She was given an oral prednisone taper 03/16/2018.  Chart notes use of MS Contin 15 mg TID, Gabapentin 300 mg TID, as well as Soma.  She is here today for evaluation of LEFT elbow pain.  Plain films of the left elbow are unremarkable.    L shoulder surgery x 2 I can find no documentation of prior L shoulder surgeries as to what she has had.  Lumbar laminectomy, microdiskectomy, decompression by Dr. Trenton Gammon L4-5, 01/2016  10/07/2016 Neurosurgery, Dr. Trenton Gammon Procedure Name:  bilateral L4-5 decompressive laminotomies with foraminotomies, redo, more than would be required for simple interbody fusion alone. L5-S1 Gill procedure with bilateral L5 and S1 decompressive foraminotomies, more than would be required for simple her body fusion alone L4-5, L5-S1 posterior lumbar interbody fusion utilizing interbody cages and locally harvested autograft L4-5 and S1 posterior lateral arthrodesis utilizing segmental pedicle screw fixation and local autograft and morcellized allograft  10/28/2016 Procedure Name: Reexploration of L4 L5 S1 posterior lumbar fusion. Removal of left L4-L5 dislodged cage and left L5 pedicle screw Revision L4-S1 posterior lateral arthrodesis utilizing nonsegmental pedicle screw fixation  Past Medical History, Surgical History, Social History, Family History, Problem  List, Medications, and Allergies have been reviewed and updated if relevant.  Patient Active Problem List   Diagnosis Date Noted  . GAD (generalized anxiety disorder) 05/23/2017  . OSA (obstructive sleep apnea) 01/09/2016  . Asthma 11/21/2015  . Chronic pain syndrome 11/21/2015  . HLD (hyperlipidemia) 09/12/2014  . Bipolar disorder ([place]) 09/12/2014    Past Medical History:  Diagnosis Date  . Anxiety   . Arthritis    neck, knees, shoulders  . Asthma   . Bipolar disorder ([place])    currently feeling MANIC- 10/02/2016  . Depression   . GERD (gastroesophageal reflux disease)   . History of blood transfusion    as a newborn   . History of lump of left breast   . Hyperlipidemia   . Lumbar pseudoarthrosis   . Motion sickness    cars  . OSA (obstructive sleep apnea) 01/09/2016   can't afford CPAP  . Personality disorder ([place])   . PONV (postoperative nausea and vomiting)   . Post traumatic stress disorder (PTSD)   . Substance abuse ([place])   . Synovial cyst     Past Surgical History:  Procedure Laterality Date  . COLONOSCOPY WITH PROPOFOL N/A 11/04/2017   Procedure: COLONOSCOPY WITH PROPOFOL;  Surgeon: Lin Landsman, MD;  Location: [facility];  Service: Endoscopy;  Laterality: N/A;  . ESOPHAGOGASTRODUODENOSCOPY (EGD) WITH PROPOFOL N/A 11/04/2017   Procedure: ESOPHAGOGASTRODUODENOSCOPY (EGD) WITH PROPOFOL with biopsies;  Surgeon: Lin Landsman, MD;  Location: [facility];  Service: Endoscopy;  Laterality: N/A;  sleep apnea  . KNEE SURGERY Left    x5, post basketball injury  . LUMBAR LAMINECTOMY/DECOMPRESSION MICRODISCECTOMY Left 01/22/2016   Procedure:  Laminectomy for facet/synovial cyst - left - Lumbar four - lumbar five;  Surgeon: Earnie Larsson, MD;  Location: [facility];  Service: Neurosurgery;  Laterality: Left;  Laminectomy for facet/synovial cyst - left - Lumbar four - lumbar five  . POLYPECTOMY N/A 11/04/2017   Procedure: POLYPECTOMY INTESTINAL;  Surgeon: Lin Landsman, MD;  Location: [facility];  Service: Endoscopy;  Laterality: N/A;  . SHOULDER SURGERY Left    x2  . TOE SURGERY Bilateral    bone spurs    Social History   Socioeconomic History  . Marital status: Single    Spouse name: Not on file  . Number of children: Not on file  . Years of education: Not on file  . Highest education level: Not on file  Occupational History  . Not on file  Social Needs  . Financial resource strain: Not on file  . Food insecurity:    Worry: Not on file    Inability: Not on file  . Transportation needs:    Medical: Not on file    Non-medical: Not on file  Tobacco Use  . Smoking status: Former Smoker    Packs/day: 1.00    Years: 1.00    Pack years: 1.00    Types: Cigarettes  . Smokeless tobacco: Never Used  . Tobacco comment: quit smoking 2016  Substance and Sexual Activity  . Alcohol use: No    Alcohol/week: 0.0 standard drinks    Comment: quit 20 years  . Drug use: Yes    Frequency: 14.0 times per week    Types: Marijuana    Comment: 20 yrs. ago- cocaine   . Sexual activity: Not Currently  Lifestyle  . Physical activity:    Days per week: Not on file    Minutes per session: Not on file  . Stress: Not on file  Relationships  . Social connections:    Talks on phone: Not on file    Gets together: Not on file    Attends religious service: Not on file    Active member of club or organization: Not on file    Attends meetings of clubs or organizations: Not on file    Relationship status: Not on file  . Intimate partner violence:    Fear of current or ex partner: Not on file    Emotionally abused: Not on file    Physically abused: Not on file    Forced sexual activity: Not on file  Other Topics Concern  . Not on file  Social History Narrative  . Not on file    Family History  Problem Relation Age of Onset  . Heart disease Father   . Hyperlipidemia Father   . Alcohol abuse Brother   . Alcohol abuse Paternal Uncle    . Breast cancer Maternal Aunt        70's  . Colon cancer Neg Hx     Allergies  Allergen Reactions  . Effexor [Venlafaxine] Other (See Comments)    UNSPECIFIED REACTION, headaches, felt funny, withdrawal with missed dose   . Lamictal [Lamotrigine] Rash    Medication list reviewed and updated in full in [place].  GEN: No fevers, chills. Nontoxic. Primarily MSK c/o today. MSK: Detailed in the HPI GI: tolerating PO intake without difficulty Neuro: No numbness, parasthesias, or tingling associated. Otherwise the pertinent positives of the ROS are noted above.   Objective:   There were no vitals taken for this visit.  ***  Radiology:  Dg Elbow Complete Left  Result Date: 03/30/2018 CLINICAL DATA:  Left elbow pain. EXAM: LEFT ELBOW - COMPLETE 3+ VIEW COMPARISON:  No prior. FINDINGS: No acute bony or joint abnormality. No evidence of fracture or dislocation. IMPRESSION: No acute abnormality. Electronically Signed   By: Marcello Moores  Register   On: 03/30/2018 16:33    Assessment and Plan:   ***

## 2018-04-05 ENCOUNTER — Ambulatory Visit: Payer: Medicare HMO | Admitting: Family Medicine

## 2018-04-05 ENCOUNTER — Ambulatory Visit: Payer: Medicare HMO | Admitting: Internal Medicine

## 2018-04-05 DIAGNOSIS — Z0289 Encounter for other administrative examinations: Secondary | ICD-10-CM

## 2018-04-07 DIAGNOSIS — L820 Inflamed seborrheic keratosis: Secondary | ICD-10-CM | POA: Diagnosis not present

## 2018-04-07 DIAGNOSIS — C44629 Squamous cell carcinoma of skin of left upper limb, including shoulder: Secondary | ICD-10-CM | POA: Diagnosis not present

## 2018-04-15 ENCOUNTER — Telehealth (HOSPITAL_COMMUNITY): Payer: Self-pay

## 2018-04-15 ENCOUNTER — Encounter: Payer: Self-pay | Admitting: Pulmonary Disease

## 2018-04-15 ENCOUNTER — Ambulatory Visit: Payer: Medicare HMO | Admitting: Pulmonary Disease

## 2018-04-15 VITALS — BP 140/78 | HR 100 | Ht 61.0 in | Wt 135.4 lb

## 2018-04-15 DIAGNOSIS — F5104 Psychophysiologic insomnia: Secondary | ICD-10-CM

## 2018-04-15 DIAGNOSIS — G4733 Obstructive sleep apnea (adult) (pediatric): Secondary | ICD-10-CM | POA: Diagnosis not present

## 2018-04-15 DIAGNOSIS — G4700 Insomnia, unspecified: Secondary | ICD-10-CM | POA: Insufficient documentation

## 2018-04-15 NOTE — Patient Instructions (Signed)
Home sleep test will be scheduled. Based on this we will get you started on a CPAP machine

## 2018-04-15 NOTE — Telephone Encounter (Signed)
Medication management - Telephone call with patient regarding her need for an evaluaiton with a psychiatrist.  Patient reported she currently goes to [facility] but is unhappy with their services.  Patient reported a history of depression, no suicidal or homicidal ideations but would like to move to our practice for medication management.  Patient reported she has [employer] and agreed to call back with appointment.  Patient requested this be left on her voicemail as she was going into a medical appointment at this time.  Called back and scheduled patient for a first evaluation with Dr. Montel Culver for Thursday 04/22/2018 at 3pm.  Informed patient on message left she would have to be here at 2pm on 04/22/2018 to be seen at 3pm as she will need to fill out paperwork that day with her history.  Requested patient call back to our office if this time is not convenient.

## 2018-04-15 NOTE — Progress Notes (Signed)
Subjective:    Patient ID: [name], female    DOB: June 19, 1965, 53 y.o.   MRN: [id number]  HPI  53 year old ex-smoker with polysubstance abuse presents for follow-up of OSA. She was seen by my partner 11/2015 for snoring, choking gasping episodes and witnessed apneas.  At that time she was also on Seroquel and hydrocodone for chronic chronic back pain.  Overnight sleep polysomnogram showed severe OSA with AHI of 36/hour, no central apneas were noted.  This was corrected by CPAP of 13 cm with nasal pillows.  Prescription was sent out for this but she could not afford a CPAP.  She was lost to follow-up after this.  Since then she has had 3 back surgeries and is now on MS Contin.  She is also on gabapentin and Soma.  She is trying to decrease the doses of these medications she is on paliperidone for bipolar disorder and takes trazodone for sleep. She states that she has been a poor sleeper all her life and reports lifelong insomnia.  Bedtime is always as late as 2:58 AM, she reports numerous nocturnal awakenings every 2 hours until she is finally out of bed at 8:30 AM.  Denies daytime naps.  Snoring persists and is also choking and gasping episodes.  Her weight has decreased from 149 to 135 pounds  Asthma appears well controlled and she is only use albuterol twice in the last 1 month  Significant tests/ events reviewed  NPSG 01/2016 >> AHI 36/h >> CPAP 13 cm   Past Medical History:  Diagnosis Date  . Anxiety   . Arthritis    neck, knees, shoulders  . Asthma   . Bipolar disorder ([place])    currently feeling MANIC- 10/02/2016  . Depression   . GERD (gastroesophageal reflux disease)   . History of blood transfusion    as a newborn   . History of lump of left breast   . Hyperlipidemia   . Lumbar pseudoarthrosis   . Motion sickness    cars  . OSA (obstructive sleep apnea) 01/09/2016   can't afford CPAP  . Personality disorder ([place])   . PONV (postoperative nausea and vomiting)   .  Post traumatic stress disorder (PTSD)   . Substance abuse ([place])   . Synovial cyst     Past Surgical History:  Procedure Laterality Date  . COLONOSCOPY WITH PROPOFOL N/A 11/04/2017   Procedure: COLONOSCOPY WITH PROPOFOL;  Surgeon: Lin Landsman, MD;  Location: [facility];  Service: Endoscopy;  Laterality: N/A;  . ESOPHAGOGASTRODUODENOSCOPY (EGD) WITH PROPOFOL N/A 11/04/2017   Procedure: ESOPHAGOGASTRODUODENOSCOPY (EGD) WITH PROPOFOL with biopsies;  Surgeon: Lin Landsman, MD;  Location: [facility];  Service: Endoscopy;  Laterality: N/A;  sleep apnea  . KNEE SURGERY Left    x5, post basketball injury  . LUMBAR LAMINECTOMY/DECOMPRESSION MICRODISCECTOMY Left 01/22/2016   Procedure: Laminectomy for facet/synovial cyst - left - Lumbar four - lumbar five;  Surgeon: Earnie Larsson, MD;  Location: [facility];  Service: Neurosurgery;  Laterality: Left;  Laminectomy for facet/synovial cyst - left - Lumbar four - lumbar five  . POLYPECTOMY N/A 11/04/2017   Procedure: POLYPECTOMY INTESTINAL;  Surgeon: Lin Landsman, MD;  Location: [facility];  Service: Endoscopy;  Laterality: N/A;  . SHOULDER SURGERY Left    x2  . TOE SURGERY Bilateral    bone spurs    Allergies  Allergen Reactions  . Effexor [Venlafaxine] Other (See Comments)    UNSPECIFIED REACTION, headaches, felt  funny, withdrawal with missed dose   . Lamictal [Lamotrigine] Rash   Social History   Socioeconomic History  . Marital status: Single    Spouse name: Not on file  . Number of children: Not on file  . Years of education: Not on file  . Highest education level: Not on file  Occupational History  . Not on file  Social Needs  . Financial resource strain: Not on file  . Food insecurity:    Worry: Not on file    Inability: Not on file  . Transportation needs:    Medical: Not on file    Non-medical: Not on file  Tobacco Use  . Smoking status: Former Smoker    Packs/day: 1.00    Years: 1.00     Pack years: 1.00    Types: Cigarettes    Last attempt to quit: 04/15/1996    Years since quitting: 22.0  . Smokeless tobacco: Never Used  . Tobacco comment: started back again in 2016, smoked for about 6 mo and then quit  Substance and Sexual Activity  . Alcohol use: No    Alcohol/week: 0.0 standard drinks    Comment: quit 20 years  . Drug use: Yes    Frequency: 14.0 times per week    Types: Marijuana    Comment: 20 yrs. ago- cocaine   . Sexual activity: Not Currently  Lifestyle  . Physical activity:    Days per week: Not on file    Minutes per session: Not on file  . Stress: Not on file  Relationships  . Social connections:    Talks on phone: Not on file    Gets together: Not on file    Attends religious service: Not on file    Active member of club or organization: Not on file    Attends meetings of clubs or organizations: Not on file    Relationship status: Not on file  . Intimate partner violence:    Fear of current or ex partner: Not on file    Emotionally abused: Not on file    Physically abused: Not on file    Forced sexual activity: Not on file  Other Topics Concern  . Not on file  Social History Narrative  . Not on file     Family History  Problem Relation Age of Onset  . Heart disease Father   . Hyperlipidemia Father   . Alcohol abuse Brother   . Alcohol abuse Paternal Uncle   . Breast cancer Maternal Aunt        70's  . Colon cancer Neg Hx     Review of Systems neg for any significant sore throat, dysphagia, itching, sneezing, nasal congestion or excess/ purulent secretions, fever, chills, sweats, unintended wt loss, pleuritic or exertional cp, hempoptysis, orthopnea pnd or change in chronic leg swelling. Also denies presyncope, palpitations, heartburn, abdominal pain, nausea, vomiting, diarrhea or change in bowel or urinary habits, dysuria,hematuria, rash, arthralgias, visual complaints, headache, numbness weakness or ataxia.     Objective:    Physical Exam  Gen. Pleasant, well-nourished, in no distress, anxious affect ENT - no pallor,icterus, no post nasal drip Neck: No JVD, no thyromegaly, no carotid bruits Lungs: no use of accessory muscles, no dullness to percussion, clear without rales or rhonchi  Cardiovascular: Rhythm regular, heart sounds  normal, no murmurs or gallops, no peripheral edema Abdomen: soft and non-tender, no hepatosplenomegaly, BS normal. Musculoskeletal: No deformities, no cyanosis or clubbing Neuro:  alert, non focal  Skin - tattos +       Assessment & Plan:

## 2018-04-15 NOTE — Assessment & Plan Note (Signed)
OSA appears to be severe based on prior study.  She has lost 14 pounds since then also she used to be on Seroquel and hydrocodone and now she is on MS Contin.  No central apneas were noted on the prior study. She will likely need repeat assessment, home sleep study may suffice to re-quantify degree of sleep disordered breathing Based on this we will probably trial auto CPAP

## 2018-04-15 NOTE — Assessment & Plan Note (Signed)
She also seems to have longstanding insomnia and I doubt that we will be able to fix that but hopefully we can provide her with improved quality sleep with CPAP by decreasing OSA related disruptions in sleep fragmentation

## 2018-04-17 ENCOUNTER — Other Ambulatory Visit: Payer: Self-pay | Admitting: Gastroenterology

## 2018-04-17 DIAGNOSIS — R112 Nausea with vomiting, unspecified: Secondary | ICD-10-CM

## 2018-04-22 ENCOUNTER — Ambulatory Visit (HOSPITAL_COMMUNITY): Payer: Medicare HMO | Admitting: Psychiatry

## 2018-04-22 ENCOUNTER — Encounter (HOSPITAL_COMMUNITY): Payer: Self-pay | Admitting: Psychiatry

## 2018-04-22 ENCOUNTER — Other Ambulatory Visit: Payer: Self-pay | Admitting: Internal Medicine

## 2018-04-22 VITALS — BP 128/83 | HR 82 | Ht 61.0 in | Wt 138.4 lb

## 2018-04-22 DIAGNOSIS — F1021 Alcohol dependence, in remission: Secondary | ICD-10-CM | POA: Diagnosis not present

## 2018-04-22 DIAGNOSIS — F3162 Bipolar disorder, current episode mixed, moderate: Secondary | ICD-10-CM | POA: Insufficient documentation

## 2018-04-22 DIAGNOSIS — F603 Borderline personality disorder: Secondary | ICD-10-CM | POA: Insufficient documentation

## 2018-04-22 DIAGNOSIS — F1220 Cannabis dependence, uncomplicated: Secondary | ICD-10-CM

## 2018-04-22 MED ORDER — TRAZODONE HCL 150 MG PO TABS: 150.0000 mg | ORAL_TABLET | Freq: Every evening | ORAL | 0 refills | Status: DC | PRN

## 2018-04-22 MED ORDER — OXCARBAZEPINE 150 MG PO TABS: 150.0000 mg | ORAL_TABLET | Freq: Two times a day (BID) | ORAL | 0 refills | Status: DC

## 2018-04-22 MED ORDER — LURASIDONE HCL 20 MG PO TABS: 20.0000 mg | ORAL_TABLET | Freq: Every day | ORAL | 0 refills | Status: DC

## 2018-04-22 NOTE — Addendum Note (Signed)
Addended by: Lurlean Nanny on: 04/22/2018 09:11 AM   Modules accepted: Orders
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
## 2018-04-28 DIAGNOSIS — C44629 Squamous cell carcinoma of skin of left upper limb, including shoulder: Secondary | ICD-10-CM | POA: Diagnosis not present

## 2018-04-28 DIAGNOSIS — L821 Other seborrheic keratosis: Secondary | ICD-10-CM | POA: Diagnosis not present

## 2018-05-06 DIAGNOSIS — Z6826 Body mass index (BMI) 26.0-26.9, adult: Secondary | ICD-10-CM | POA: Diagnosis not present

## 2018-05-06 DIAGNOSIS — M4317 Spondylolisthesis, lumbosacral region: Secondary | ICD-10-CM | POA: Diagnosis not present

## 2018-05-07 ENCOUNTER — Other Ambulatory Visit: Payer: Self-pay | Admitting: Gastroenterology

## 2018-05-07 DIAGNOSIS — R112 Nausea with vomiting, unspecified: Secondary | ICD-10-CM

## 2018-05-12 ENCOUNTER — Other Ambulatory Visit: Payer: Self-pay

## 2018-05-12 DIAGNOSIS — G4733 Obstructive sleep apnea (adult) (pediatric): Secondary | ICD-10-CM | POA: Diagnosis not present

## 2018-05-14 ENCOUNTER — Telehealth: Payer: Self-pay | Admitting: Pulmonary Disease

## 2018-05-14 DIAGNOSIS — G4733 Obstructive sleep apnea (adult) (pediatric): Secondary | ICD-10-CM | POA: Diagnosis not present

## 2018-05-14 NOTE — Telephone Encounter (Signed)
Per RA, HST showed moderate OSA with 15 events per hour. Deceased compared to last time.   Recommends RX for autocpap 5-15cm, nasal pillows. OV in 6 weeks.

## 2018-05-20 ENCOUNTER — Other Ambulatory Visit: Payer: Self-pay

## 2018-05-20 ENCOUNTER — Encounter (HOSPITAL_COMMUNITY): Payer: Self-pay | Admitting: Psychiatry

## 2018-05-20 ENCOUNTER — Ambulatory Visit (HOSPITAL_COMMUNITY): Payer: Medicare HMO | Admitting: Psychiatry

## 2018-05-20 VITALS — BP 116/81 | HR 62 | Temp 97.5°F | Resp 18 | Wt 135.8 lb

## 2018-05-20 DIAGNOSIS — Z811 Family history of alcohol abuse and dependence: Secondary | ICD-10-CM

## 2018-05-20 DIAGNOSIS — M545 Low back pain: Secondary | ICD-10-CM

## 2018-05-20 DIAGNOSIS — F603 Borderline personality disorder: Secondary | ICD-10-CM | POA: Diagnosis not present

## 2018-05-20 DIAGNOSIS — F3162 Bipolar disorder, current episode mixed, moderate: Secondary | ICD-10-CM

## 2018-05-20 DIAGNOSIS — F1220 Cannabis dependence, uncomplicated: Secondary | ICD-10-CM

## 2018-05-20 DIAGNOSIS — F1210 Cannabis abuse, uncomplicated: Secondary | ICD-10-CM | POA: Diagnosis not present

## 2018-05-20 DIAGNOSIS — F1021 Alcohol dependence, in remission: Secondary | ICD-10-CM | POA: Diagnosis not present

## 2018-05-20 MED ORDER — LURASIDONE HCL 20 MG PO TABS: 20.0000 mg | ORAL_TABLET | Freq: Every day | ORAL | 2 refills | Status: DC

## 2018-05-20 MED ORDER — OXCARBAZEPINE 150 MG PO TABS: 150.0000 mg | ORAL_TABLET | Freq: Two times a day (BID) | ORAL | 2 refills | Status: DC

## 2018-05-20 MED ORDER — TRAZODONE HCL 300 MG PO TABS: 300.0000 mg | ORAL_TABLET | Freq: Every evening | ORAL | 2 refills | Status: DC | PRN

## 2018-05-20 NOTE — Progress Notes (Signed)
BH MD/PA/NP OP Progress Note  05/20/2018 2:41 PM [name]  MRN:  [id number]  Chief Complaint:  HPI: 53 yo single female with long psychiatric history of mood fluctuations, past SI, impulsivity, irritability, insomnia -past hospitalizations. She has stopped seeing providers at [facility] and came here instead - this is her second visit with me. She has been off psychotropic meds for over a year. Medical problems related to back pain, IBS. She is trying to come off MS Contin, just stopped Soma. Several medciation trials in the past - does not recall all meds she had been on. Very concerned about potential weight gain on meds (gained weight on Seroquel and Depakote). Her psychiatric diagnoses include Bipolar 1 disorder, mixed, moderate; Borderline personality disorder; Cannabis use disorder and Alcohol use disorder in sustained remission. At her initial visit we decided to try a combination of lurasidone 20 mg and oxacarbazepine 150 mg bid - patient wanted to start on lowest doses possible. We also added trazodone again for insomnia (in the past she was on a high dose 300 mg for sleep). Her mood is stable, she tolerates all meds well and is satisfied with her current state. She had to use double dose of trazodone (300 mg) as she did not sleep long enough on 150 mg. She is to start her individual counseling soon.  Visit Diagnosis:    ICD-10-CM   1. Bipolar 1 disorder, mixed, moderate (HCC) F31.62   2. Borderline personality disorder ([place]) F60.3   3. Alcohol use disorder, moderate, in sustained remission (HCC) F10.21   4. Cannabis use disorder, moderate, dependence (HCC) F12.20     Past Psychiatric History: Please see initial H&P.  Past Medical History:  Past Medical History:  Diagnosis Date  . Anxiety   . Arthritis    neck, knees, shoulders  . Asthma   . Bipolar disorder ([place])    currently feeling MANIC- 10/02/2016  . Depression   . GERD (gastroesophageal reflux disease)   . History of blood  transfusion    as a newborn   . History of lump of left breast   . Hyperlipidemia   . Lumbar pseudoarthrosis   . Motion sickness    cars  . OSA (obstructive sleep apnea) 01/09/2016   can't afford CPAP  . Personality disorder ([place])   . PONV (postoperative nausea and vomiting)   . Post traumatic stress disorder (PTSD)   . Substance abuse ([place])   . Synovial cyst     Past Surgical History:  Procedure Laterality Date  . COLONOSCOPY WITH PROPOFOL N/A 11/04/2017   Procedure: COLONOSCOPY WITH PROPOFOL;  Surgeon: Lin Landsman, MD;  Location: [facility];  Service: Endoscopy;  Laterality: N/A;  . ESOPHAGOGASTRODUODENOSCOPY (EGD) WITH PROPOFOL N/A 11/04/2017   Procedure: ESOPHAGOGASTRODUODENOSCOPY (EGD) WITH PROPOFOL with biopsies;  Surgeon: Lin Landsman, MD;  Location: [facility];  Service: Endoscopy;  Laterality: N/A;  sleep apnea  . KNEE SURGERY Left    x5, post basketball injury  . LUMBAR LAMINECTOMY/DECOMPRESSION MICRODISCECTOMY Left 01/22/2016   Procedure: Laminectomy for facet/synovial cyst - left - Lumbar four - lumbar five;  Surgeon: Earnie Larsson, MD;  Location: [facility];  Service: Neurosurgery;  Laterality: Left;  Laminectomy for facet/synovial cyst - left - Lumbar four - lumbar five  . POLYPECTOMY N/A 11/04/2017   Procedure: POLYPECTOMY INTESTINAL;  Surgeon: Lin Landsman, MD;  Location: [facility];  Service: Endoscopy;  Laterality: N/A;  . SHOULDER SURGERY Left    x2  .  TOE SURGERY Bilateral    bone spurs    Family Psychiatric History: Rveiwed  Family History:  Family History  Problem Relation Age of Onset  . Heart disease Father   . Hyperlipidemia Father   . Alcohol abuse Brother   . Alcohol abuse Paternal Uncle   . Breast cancer Maternal Aunt        70's  . Colon cancer Neg Hx     Social History:  Social History   Socioeconomic History  . Marital status: Single    Spouse name: Not on file  . Number of children: 0  . Years of  education: Not on file  . Highest education level: Bachelor's degree (e.g., BA, AB, BS)  Occupational History  . Not on file  Social Needs  . Financial resource strain: Not on file  . Food insecurity:    Worry: Sometimes true    Inability: Not on file  . Transportation needs:    Medical: No    Non-medical: No  Tobacco Use  . Smoking status: Former Smoker    Packs/day: 1.00    Years: 1.00    Pack years: 1.00    Types: Cigarettes    Last attempt to quit: 04/15/1996    Years since quitting: 22.1  . Smokeless tobacco: Never Used  . Tobacco comment: started back again in 2016, smoked for about 6 mo and then quit  Substance and Sexual Activity  . Alcohol use: No    Alcohol/week: 0.0 standard drinks    Comment: quit 20 years  . Drug use: Yes    Frequency: 14.0 times per week    Types: Marijuana    Comment: 20 yrs. ago- cocaine   . Sexual activity: Not Currently  Lifestyle  . Physical activity:    Days per week: 0 days    Minutes per session: 0 min  . Stress: Very much  Relationships  . Social connections:    Talks on phone: Not on file    Gets together: Not on file    Attends religious service: 1 to 4 times per year    Active member of club or organization: Not on file    Attends meetings of clubs or organizations: Not on file    Relationship status: Never married  Other Topics Concern  . Not on file  Social History Narrative  . Not on file    Allergies:  Allergies  Allergen Reactions  . Effexor [Venlafaxine] Other (See Comments)    UNSPECIFIED REACTION, headaches, felt funny, withdrawal with missed dose   . Lamictal [Lamotrigine] Rash    Metabolic Disorder Labs: No results found for: HGBA1C, MPG No results found for: PROLACTIN Lab Results  Component Value Date   CHOL 168 05/21/2017   TRIG 137.0 05/21/2017   HDL 65.70 05/21/2017   CHOLHDL 3 05/21/2017   VLDL 27.4 05/21/2017   LDLCALC 75 05/21/2017   LDLCALC 93 03/15/2015   Lab Results  Component Value  Date   TSH 1.30 03/15/2015    Therapeutic Level Labs: No results found for: LITHIUM No results found for: VALPROATE No components found for:  CBMZ  Current Medications: Current Outpatient Medications  Medication Sig Dispense Refill  . albuterol (PROVENTIL HFA;VENTOLIN HFA) 108 (90 Base) MCG/ACT inhaler Inhale 2 puffs into the lungs every 4 (four) hours as needed for wheezing or shortness of breath. 1 Inhaler 1  . aspirin 81 MG tablet Take 81 mg by mouth daily.    Marland Kitchen buPROPion (WELLBUTRIN XL) 150  MG 24 hr tablet TAKE 1 TABLET BY MOUTH EVERY DAY IN THE MORNING  2  . carisoprodol (SOMA) 350 MG tablet Take 350 mg by mouth every 6 (six) hours as needed.  0  . diphenoxylate-atropine (LOMOTIL) 2.5-0.025 MG tablet Take 1 tablet by mouth daily as needed for diarrhea or loose stools. 30 tablet 0  . gabapentin (NEURONTIN) 300 MG capsule Take 1 capsule by mouth 3 (three) times daily.    Marland Kitchen lurasidone (LATUDA) 20 MG TABS tablet Take 1 tablet (20 mg total) by mouth at bedtime for 30 days. 30 tablet 0  . morphine (MS CONTIN) 15 MG 12 hr tablet Take 1 tablet by mouth 3 (three) times daily.    Marland Kitchen omeprazole (PRILOSEC) 20 MG capsule TAKE 1 CAPSULE (20 MG TOTAL) BY MOUTH DAILY BEFORE BREAKFAST. 90 capsule 0  . ondansetron (ZOFRAN) 4 MG tablet TAKE 1 TABLET (4 MG TOTAL) BY MOUTH 2 (TWO) TIMES DAILY. 60 tablet 0  . OXcarbazepine (TRILEPTAL) 150 MG tablet Take 1 tablet (150 mg total) by mouth 2 (two) times daily for 30 days. 60 tablet 0  . simvastatin (ZOCOR) 20 MG tablet TAKE 1 TABLET (20 MG TOTAL) BY MOUTH DAILY AT 6 PM. 90 tablet 0  . tiZANidine (ZANAFLEX) 4 MG capsule Take 4 mg by mouth 3 (three) times daily.    . traZODone (DESYREL) 150 MG tablet Take 1 tablet (150 mg total) by mouth at bedtime as needed for up to 30 days for sleep. 30 tablet 0   No current facility-administered medications for this visit.      Musculoskeletal: Strength & Muscle Tone: within normal limits Gait & Station: normal Patient  leans: N/A  Psychiatric Specialty Exam: Review of Systems  Constitutional: Negative.   HENT: Negative.   Gastrointestinal: Positive for heartburn.  Musculoskeletal: Positive for back pain.  Skin: Negative.   Neurological: Negative.   Psychiatric/Behavioral: Negative.   All other systems reviewed and are negative.   Blood pressure 116/81, pulse 62, temperature (!) 97.5 F (36.4 C), resp. rate 18, weight 135 lb 12.8 oz (61.6 kg).Body mass index is 25.66 kg/m.  General Appearance: Casual and multiple body tatoos  Eye Contact:  Good  Speech:  Clear and Coherent and Normal Rate  Volume:  Normal  Mood:  Euthymic  Affect:  Full Range  Thought Process:  Goal Directed  Orientation:  Full (Time, Place, and Person)  Thought Content: Logical   Suicidal Thoughts:  No  Homicidal Thoughts:  No  Memory:  Immediate;   Good Recent;   Good Remote;   Good  Judgement:  Fair  Insight:  Fair  Psychomotor Activity:  Normal  Concentration:  Concentration: Good  Recall:  Good  Fund of Knowledge: Fair  Language: Good  Akathisia:  Negative  Handed:  Right  AIMS (if indicated): not done  Assets:  Communication Skills Desire for Improvement Housing Resilience Social Support  ADL's:  Intact  Cognition: WNL  Sleep:  Fair   Screenings: PHQ2-9     Office Visit from 05/21/2017 in [place] at [facility] Visit from 09/12/2015 in [place] at [facility] Visit from 09/12/2014 in [place] at [place]  PHQ-2 Total Score  0  0  1       Assessment and Plan: 53 yo single female with long psychiatric history of mood fluctuations, past SI, impulsivity, irritability, insomnia -past hospitalizations. Her psychiatric diagnoses include Bipolar 1 disorder, mixed, moderate; Borderline personality disorder; Cannabis use disorder and  Alcohol use disorder in sustained remission.  She has stopped seeing providers at [facility] and came here instead - this is her second  visit with me. She has been off psychotropic meds for over a year. Medical problems related to back pain, IBS. She is trying to come off MS Contin, stopped Manuela Neptune and is now on Zanaflex. Several medciation trials in the past - does not recall all meds she had been on. Very concerned about potential weight gain on meds (gained weight on Seroquel and Depakote). At her initial visit we decided to try a combination of lurasidone 20 mg and oxacarbazepine 150 mg bid - patient wanted to start on lowest doses possible. We also added trazodone 150 mg again for insomnia but it proved to be too low a dose. She herself increased it to 300 mg and at this dose she sleeps well. We will therefore continue all meds uncharged for now. [name] will return to clinic in 3 months or prn.    Stephanie Acre, MD 05/20/2018, 2:41 PM

## 2018-05-24 NOTE — Telephone Encounter (Signed)
Called and spoke with patient regarding results Patient expressed and verbalized understanding Placed order for autocpap 5-15cm, mask of choice and supplies Nothing further needed.

## 2018-05-26 ENCOUNTER — Other Ambulatory Visit: Payer: Self-pay

## 2018-05-26 ENCOUNTER — Telehealth (INDEPENDENT_AMBULATORY_CARE_PROVIDER_SITE_OTHER): Payer: Medicare HMO | Admitting: Internal Medicine

## 2018-05-26 DIAGNOSIS — J301 Allergic rhinitis due to pollen: Secondary | ICD-10-CM

## 2018-05-26 DIAGNOSIS — R112 Nausea with vomiting, unspecified: Secondary | ICD-10-CM

## 2018-05-26 DIAGNOSIS — R195 Other fecal abnormalities: Secondary | ICD-10-CM | POA: Diagnosis not present

## 2018-05-26 MED ORDER — ALBUTEROL SULFATE HFA 108 (90 BASE) MCG/ACT IN AERS: 2.0000 | INHALATION_SPRAY | Freq: Four times a day (QID) | RESPIRATORY_TRACT | 0 refills | Status: DC | PRN

## 2018-05-26 NOTE — Patient Instructions (Signed)

## 2018-05-26 NOTE — Progress Notes (Signed)
Virtual Visit via Video Note  I connected with [name] on 05/26/18 at  2:45 PM EDT by a video enabled telemedicine application and verified that I am speaking with the correct person using two identifiers.   I discussed the limitations of evaluation and management by telemedicine and the availability of in person appointments. The patient expressed understanding and agreed to proceed.  History of Present Illness: Pt reports fatigue, runny nose and cough. This started 4 days ago. She is blowing clear mucous out of her nose. The cough is non productive. She reports associated nausea, vomiting and diarrhea x 1. She denies headache, nasal congestion, ear pain, sore throat or shortness of breath. She denies fever, chills or body aches. She has tried Flonase and Lomotil with some relief. She has not had sick contacts that she is aware of. She does smoke.     Observations/Objective: Alert and oriented x 3. NAD. Does not sound congested. No apparent SOB.  Assessment and Plan:  Allergic Rhinitis:  Continue Flonase Add in Allegra or Zyrtec OTC Albuterol refilled today  Nausea, Vomiting, Loose Stools  Likely coincidental Doesn't even seem viral Discussed the importance of hand washing She has Zofran to take if needed Advised her to not use Lomotil unless persistent diarrhea  Follow Up Instructions:    I discussed the assessment and treatment plan with the patient. The patient was provided an opportunity to ask questions and all were answered. The patient agreed with the plan and demonstrated an understanding of the instructions.   The patient was advised to call back or seek an in-person evaluation if the symptoms worsen or if the condition fails to improve as anticipated.  I provided 6 minutes of non-face-to-face time during this encounter.   Webb Silversmith, NP

## 2018-05-26 NOTE — Addendum Note (Signed)
Addended by: Jearld Fenton on: 05/26/2018 02:46 PM   Modules accepted: Orders

## 2018-05-28 ENCOUNTER — Ambulatory Visit: Payer: Medicare HMO | Admitting: Psychology

## 2018-06-03 DIAGNOSIS — G4733 Obstructive sleep apnea (adult) (pediatric): Secondary | ICD-10-CM | POA: Diagnosis not present

## 2018-06-14 ENCOUNTER — Other Ambulatory Visit: Payer: Self-pay | Admitting: Internal Medicine

## 2018-06-18 ENCOUNTER — Other Ambulatory Visit: Payer: Self-pay | Admitting: Gastroenterology

## 2018-06-18 DIAGNOSIS — K529 Noninfective gastroenteritis and colitis, unspecified: Secondary | ICD-10-CM

## 2018-06-21 ENCOUNTER — Other Ambulatory Visit: Payer: Self-pay | Admitting: Internal Medicine

## 2018-06-21 ENCOUNTER — Telehealth: Payer: Self-pay | Admitting: Internal Medicine

## 2018-06-21 MED ORDER — SIMVASTATIN 20 MG PO TABS: 20.0000 mg | ORAL_TABLET | Freq: Every day | ORAL | 0 refills | Status: DC

## 2018-06-21 NOTE — Addendum Note (Signed)
Addended by: Jearld Fenton on: 06/21/2018 03:54 PM   Modules accepted: Orders

## 2018-06-21 NOTE — Telephone Encounter (Signed)
Due for follow up and labs. Please schedule [email or web address]

## 2018-06-21 NOTE — Telephone Encounter (Signed)
Pt is scheduled 4/21

## 2018-06-21 NOTE — Telephone Encounter (Signed)
Caller Name: Morgane Joerger Relationship to Patient:  Best Number: [phone] Pharmacy: [facility], [address]  Reason for call: refill for simvastatin, pt is out of medication

## 2018-06-21 NOTE — Telephone Encounter (Signed)
Medication refilled

## 2018-06-22 ENCOUNTER — Encounter: Payer: Self-pay | Admitting: Internal Medicine

## 2018-06-22 ENCOUNTER — Ambulatory Visit (INDEPENDENT_AMBULATORY_CARE_PROVIDER_SITE_OTHER): Payer: Medicare HMO | Admitting: Internal Medicine

## 2018-06-22 ENCOUNTER — Other Ambulatory Visit (INDEPENDENT_AMBULATORY_CARE_PROVIDER_SITE_OTHER): Payer: Medicare HMO

## 2018-06-22 DIAGNOSIS — G894 Chronic pain syndrome: Secondary | ICD-10-CM

## 2018-06-22 DIAGNOSIS — E871 Hypo-osmolality and hyponatremia: Secondary | ICD-10-CM

## 2018-06-22 DIAGNOSIS — E7800 Pure hypercholesterolemia, unspecified: Secondary | ICD-10-CM

## 2018-06-22 DIAGNOSIS — F319 Bipolar disorder, unspecified: Secondary | ICD-10-CM | POA: Diagnosis not present

## 2018-06-22 DIAGNOSIS — F5104 Psychophysiologic insomnia: Secondary | ICD-10-CM | POA: Diagnosis not present

## 2018-06-22 DIAGNOSIS — F411 Generalized anxiety disorder: Secondary | ICD-10-CM

## 2018-06-22 DIAGNOSIS — J4520 Mild intermittent asthma, uncomplicated: Secondary | ICD-10-CM

## 2018-06-22 DIAGNOSIS — K219 Gastro-esophageal reflux disease without esophagitis: Secondary | ICD-10-CM | POA: Diagnosis not present

## 2018-06-22 DIAGNOSIS — G4733 Obstructive sleep apnea (adult) (pediatric): Secondary | ICD-10-CM

## 2018-06-22 LAB — CBC
HCT: 36.6 % (ref 36.0–46.0)
Hemoglobin: 12.8 g/dL (ref 12.0–15.0)
MCHC: 34.9 g/dL (ref 30.0–36.0)
MCV: 92.3 fl (ref 78.0–100.0)
Platelets: 338 10*3/uL (ref 150.0–400.0)
RBC: 3.96 Mil/uL (ref 3.87–5.11)
RDW: 13.9 % (ref 11.5–15.5)
WBC: 6.2 10*3/uL (ref 4.0–10.5)

## 2018-06-22 LAB — COMPREHENSIVE METABOLIC PANEL
ALT: 17 U/L (ref 0–35)
AST: 18 U/L (ref 0–37)
Albumin: 4.1 g/dL (ref 3.5–5.2)
Alkaline Phosphatase: 68 U/L (ref 39–117)
BUN: 9 mg/dL (ref 6–23)
CO2: 28 mEq/L (ref 19–32)
Calcium: 9.1 mg/dL (ref 8.4–10.5)
Chloride: 95 mEq/L — ABNORMAL LOW (ref 96–112)
Creatinine, Ser: 0.69 mg/dL (ref 0.40–1.20)
GFR: 88.8 mL/min (ref >60.00)
Glucose, Bld: 120 mg/dL — ABNORMAL HIGH (ref 70–99)
Potassium: 3.9 mEq/L (ref 3.5–5.1)
Sodium: 128 mEq/L — ABNORMAL LOW (ref 135–145)
Total Bilirubin: 0.3 mg/dL (ref 0.2–1.2)
Total Protein: 6.6 g/dL (ref 6.0–8.3)

## 2018-06-22 LAB — LIPID PANEL
Cholesterol: 165 mg/dL (ref 0–200)
HDL: 63.9 mg/dL (ref >39.00)
LDL Cholesterol: 79 mg/dL (ref 0–99)
NonHDL: 101.02
Total CHOL/HDL Ratio: 3
Triglycerides: 111 mg/dL (ref 0.0–149.0)
VLDL: 22.2 mg/dL (ref 0.0–40.0)

## 2018-06-22 LAB — HEMOGLOBIN A1C: Hgb A1c MFr Bld: 5.6 % (ref 4.6–6.5)

## 2018-06-22 NOTE — Assessment & Plan Note (Signed)
Continue CPAP Will monitor 

## 2018-06-22 NOTE — Assessment & Plan Note (Signed)
Continue Omeprazole for now Discussed avoiding foods that trigger reflux Will have her set up lab only appt for CBC and CMET

## 2018-06-22 NOTE — Assessment & Plan Note (Signed)
Encouraged smoking cessation She declines Continue Albuterol prn

## 2018-06-22 NOTE — Assessment & Plan Note (Signed)
Continue Latuda and Trileptal Support offered today She will continue to follow with psych Will have her set up lab only appt for CMET and A1C

## 2018-06-22 NOTE — Patient Instructions (Signed)
Fat and Cholesterol Restricted Eating Plan Getting too much fat and cholesterol in your diet may cause health problems. Choosing the right foods helps keep your fat and cholesterol at normal levels. This can keep you from getting certain diseases. Your doctor may recommend an eating plan that includes:  Total fat: ______% or less of total calories a day.  Saturated fat: ______% or less of total calories a day.  Cholesterol: less than _________mg a day.  Fiber: ______g a day. What are tips for following this plan? Meal planning  At meals, divide your plate into four equal parts: ? Fill one-half of your plate with vegetables and green salads. ? Fill one-fourth of your plate with whole grains. ? Fill one-fourth of your plate with low-fat (lean) protein foods.  Eat fish that is high in omega-3 fats at least two times a week. This includes mackerel, tuna, sardines, and salmon.  Eat foods that are high in fiber, such as whole grains, beans, apples, broccoli, carrots, peas, and barley. General tips   Work with your doctor to lose weight if you need to.  Avoid: ? Foods with added sugar. ? Fried foods. ? Foods with partially hydrogenated oils.  Limit alcohol intake to no more than 1 drink a day for nonpregnant women and 2 drinks a day for men. One drink equals 12 oz of beer, 5 oz of wine, or 1 oz of hard liquor. Reading food labels  Check food labels for: ? Trans fats. ? Partially hydrogenated oils. ? Saturated fat (g) in each serving. ? Cholesterol (mg) in each serving. ? Fiber (g) in each serving.  Choose foods with healthy fats, such as: ? Monounsaturated fats. ? Polyunsaturated fats. ? Omega-3 fats.  Choose grain products that have whole grains. Look for the word "whole" as the first word in the ingredient list. Cooking  Cook foods using low-fat methods. These include baking, boiling, grilling, and broiling.  Eat more home-cooked foods. Eat at restaurants and buffets  less often.  Avoid cooking using saturated fats, such as butter, cream, palm oil, palm kernel oil, and coconut oil. Recommended foods  Fruits  All fresh, canned (in natural juice), or frozen fruits. Vegetables  Fresh or frozen vegetables (raw, steamed, roasted, or grilled). Green salads. Grains  Whole grains, such as whole wheat or whole grain breads, crackers, cereals, and pasta. Unsweetened oatmeal, bulgur, barley, quinoa, or brown rice. Corn or whole wheat flour tortillas. Meats and other protein foods  Ground beef (85% or leaner), grass-fed beef, or beef trimmed of fat. Skinless chicken or turkey. Ground chicken or turkey. Pork trimmed of fat. All fish and seafood. Egg whites. Dried beans, peas, or lentils. Unsalted nuts or seeds. Unsalted canned beans. Nut butters without added sugar or oil. Dairy  Low-fat or nonfat dairy products, such as skim or 1% milk, 2% or reduced-fat cheeses, low-fat and fat-free ricotta or cottage cheese, or plain low-fat and nonfat yogurt. Fats and oils  Tub margarine without trans fats. Light or reduced-fat mayonnaise and salad dressings. Avocado. Olive, canola, sesame, or safflower oils. The items listed above may not be a complete list of foods and beverages you can eat. Contact a dietitian for more information. Foods to avoid Fruits  Canned fruit in heavy syrup. Fruit in cream or butter sauce. Fried fruit. Vegetables  Vegetables cooked in cheese, cream, or butter sauce. Fried vegetables. Grains  White bread. White pasta. White rice. Cornbread. Bagels, pastries, and croissants. Crackers and snack foods that contain trans fat   and hydrogenated oils. Meats and other protein foods  Fatty cuts of meat. Ribs, chicken wings, bacon, sausage, bologna, salami, chitterlings, fatback, hot dogs, bratwurst, and packaged lunch meats. Liver and organ meats. Whole eggs and egg yolks. Chicken and turkey with skin. Fried meat. Dairy  Whole or 2% milk, cream,  half-and-half, and cream cheese. Whole milk cheeses. Whole-fat or sweetened yogurt. Full-fat cheeses. Nondairy creamers and whipped toppings. Processed cheese, cheese spreads, and cheese curds. Beverages  Alcohol. Sugar-sweetened drinks such as sodas, lemonade, and fruit drinks. Fats and oils  Butter, stick margarine, lard, shortening, ghee, or bacon fat. Coconut, palm kernel, and palm oils. Sweets and desserts  Corn syrup, sugars, honey, and molasses. Candy. Jam and jelly. Syrup. Sweetened cereals. Cookies, pies, cakes, donuts, muffins, and ice cream. The items listed above may not be a complete list of foods and beverages you should avoid. Contact a dietitian for more information. Summary  Choosing the right foods helps keep your fat and cholesterol at normal levels. This can keep you from getting certain diseases.  At meals, fill one-half of your plate with vegetables and green salads.  Eat high-fiber foods, like whole grains, beans, apples, carrots, peas, and barley.  Limit added sugar, saturated fats, alcohol, and fried foods. This information is not intended to replace advice given to you by your health care provider. Make sure you discuss any questions you have with your health care provider. Document Released: 08/19/2011 Document Revised: 10/21/2017 Document Reviewed: 11/04/2016 Elsevier Interactive Patient Education  2019 [employer] Inc[employer]   

## 2018-06-22 NOTE — Assessment & Plan Note (Signed)
Will have her set up lab only appt for CMET and Lipid profile Encouraged her to consume a low fat diet Continue Simvastatin for now

## 2018-06-22 NOTE — Assessment & Plan Note (Signed)
Continue Trazadone Will monitor

## 2018-06-22 NOTE — Progress Notes (Signed)
Virtual Visit via Video Note  I connected with [name] on 06/22/18 at  9:45 AM EDT by a video enabled telemedicine application and verified that I am speaking with the correct person using two identifiers.   I discussed the limitations of evaluation and management by telemedicine and the availability of in person appointments. The patient expressed understanding and agreed to proceed.  Patient Location: Home Provider Location: Office  History of Present Illness:  Pt due for follow up of chronic conditions.  Anxiety/Bipolar Depression: Chronic. She is taking [place] and Trileptal as prescribed. She is currently following with psychiatry. She denies SI/HI.  Asthma: Chronic cough. She does continue to smoke and is not interested in quitting. She uses her Albuterol inhaler only when she gets sick.  GERD: She denies breakthrough on Omeprazole. Upper GI from 11/2017 reviewed.  HLD: Her last LDL was 75, 05/2017. She denies myalgias on Simvastatin. She does not consume a low fat diet.  OSA/Insomnia: She has trouble falling asleep and staying asleep. She is wearing her CPAP. She is taking Trazadone nightly with good relief. Sleep study from 05/2018 reviewed.  Chronic Back Pain: Failed surgery syndrome. She is taking MS Contin, Gabapentin and Zanaflex with some relief. She follows with Dr. Annette Stable.    Past Medical History:  Diagnosis Date  . Anxiety   . Arthritis    neck, knees, shoulders  . Asthma   . Bipolar disorder ([place])    currently feeling MANIC- 10/02/2016  . Depression   . GERD (gastroesophageal reflux disease)   . History of blood transfusion    as a newborn   . History of lump of left breast   . Hyperlipidemia   . Lumbar pseudoarthrosis   . Motion sickness    cars  . OSA (obstructive sleep apnea) 01/09/2016   can't afford CPAP  . Personality disorder ([place])   . PONV (postoperative nausea and vomiting)   . Post traumatic stress disorder (PTSD)   . Substance abuse ([place])   .  Synovial cyst     Current Outpatient Medications  Medication Sig Dispense Refill  . albuterol (PROVENTIL HFA;VENTOLIN HFA) 108 (90 Base) MCG/ACT inhaler TAKE 2 PUFFS BY MOUTH EVERY 6 HOURS AS NEEDED FOR WHEEZE OR SHORTNESS OF BREATH 6.7 Inhaler 0  . aspirin 81 MG tablet Take 81 mg by mouth daily.    . diphenoxylate-atropine (LOMOTIL) 2.5-0.025 MG tablet Take 1 tablet by mouth daily as needed for diarrhea or loose stools. 30 tablet 0  . gabapentin (NEURONTIN) 300 MG capsule Take 1 capsule by mouth 3 (three) times daily.    Marland Kitchen lurasidone (LATUDA) 20 MG TABS tablet Take 1 tablet (20 mg total) by mouth at bedtime. 30 tablet 2  . morphine (MS CONTIN) 15 MG 12 hr tablet Take 1 tablet by mouth 3 (three) times daily.    Marland Kitchen omeprazole (PRILOSEC) 20 MG capsule TAKE 1 CAPSULE (20 MG TOTAL) BY MOUTH DAILY BEFORE BREAKFAST. 90 capsule 0  . OXcarbazepine (TRILEPTAL) 150 MG tablet Take 1 tablet (150 mg total) by mouth 2 (two) times daily. 60 tablet 2  . simvastatin (ZOCOR) 20 MG tablet Take 1 tablet (20 mg total) by mouth daily at 6 PM. 90 tablet 0  . tiZANidine (ZANAFLEX) 4 MG capsule Take 4 mg by mouth 3 (three) times daily.    . traZODone (DESYREL) 300 MG tablet Take 1 tablet (300 mg total) by mouth at bedtime as needed for sleep. 30 tablet 2   No current facility-administered  medications for this visit.     Allergies  Allergen Reactions  . Effexor [Venlafaxine] Other (See Comments)    UNSPECIFIED REACTION, headaches, felt funny, withdrawal with missed dose   . Lamictal [Lamotrigine] Rash    Family History  Problem Relation Age of Onset  . Heart disease Father   . Hyperlipidemia Father   . Alcohol abuse Brother   . Alcohol abuse Paternal Uncle   . Breast cancer Maternal Aunt        70's  . Colon cancer Neg Hx     Social History   Socioeconomic History  . Marital status: Single    Spouse name: Not on file  . Number of children: 0  . Years of education: Not on file  . Highest education  level: Bachelor's degree (e.g., BA, AB, BS)  Occupational History  . Not on file  Social Needs  . Financial resource strain: Not on file  . Food insecurity:    Worry: Sometimes true    Inability: Not on file  . Transportation needs:    Medical: No    Non-medical: No  Tobacco Use  . Smoking status: Former Smoker    Packs/day: 1.00    Years: 1.00    Pack years: 1.00    Types: Cigarettes    Last attempt to quit: 04/15/1996    Years since quitting: 22.2  . Smokeless tobacco: Never Used  . Tobacco comment: started back again in 2016, smoked for about 6 mo and then quit  Substance and Sexual Activity  . Alcohol use: No    Alcohol/week: 0.0 standard drinks    Comment: quit 20 years  . Drug use: Yes    Frequency: 14.0 times per week    Types: Marijuana    Comment: 20 yrs. ago- cocaine   . Sexual activity: Not Currently  Lifestyle  . Physical activity:    Days per week: 0 days    Minutes per session: 0 min  . Stress: Very much  Relationships  . Social connections:    Talks on phone: Not on file    Gets together: Not on file    Attends religious service: 1 to 4 times per year    Active member of club or organization: Not on file    Attends meetings of clubs or organizations: Not on file    Relationship status: Never married  . Intimate partner violence:    Fear of current or ex partner: Not on file    Emotionally abused: Not on file    Physically abused: Not on file    Forced sexual activity: Not on file  Other Topics Concern  . Not on file  Social History Narrative  . Not on file     Constitutional: Denies fever, malaise, fatigue, headache or abrupt weight changes.  HEENT: Denies eye pain, eye redness, ear pain, ringing in the ears, wax buildup, runny nose, nasal congestion, bloody nose, or sore throat. Respiratory: Denies difficulty breathing, shortness of breath, cough or sputum production.   Cardiovascular: Denies chest pain, chest tightness, palpitations or  swelling in the hands or feet.  Gastrointestinal: Denies abdominal pain, bloating, constipation, diarrhea or blood in the stool.  GU: Denies urgency, frequency, pain with urination, burning sensation, blood in urine, odor or discharge. Musculoskeletal: Denies decrease in range of motion, difficulty with gait, muscle pain or joint pain and swelling.  Skin: Denies redness, rashes, lesions or ulcercations.  Neurological: Denies dizziness, difficulty with memory, difficulty with speech or  problems with balance and coordination.  Psych: Denies anxiety, depression, SI/HI.  No other specific complaints in a complete review of systems (except as listed in HPI above).  There were no vitals taken for this visit. Wt Readings from Last 3 Encounters:  04/15/18 135 lb 6.4 oz (61.4 kg)  03/30/18 135 lb (61.2 kg)  03/16/18 137 lb (62.1 kg)    General: Appears their stated age, well developed, well nourished in NAD. Skin: Warm, dry and intact. No rashes, lesions or ulcerations noted. HEENT: Head: normal shape and size; Eyes: sclera white, no icterus, conjunctiva pink, PERRLA and EOMs intact; Ears: Tm's gray and intact, normal light reflex; Nose: mucosa pink and moist, septum midline; Throat/Mouth: Teeth present, mucosa pink and moist, no exudate, lesions or ulcerations noted.  Neck:  Neck supple, trachea midline. No masses, lumps or thyromegaly present.  Cardiovascular: Normal rate and rhythm. S1,S2 noted.  No murmur, rubs or gallops noted. No JVD or BLE edema. No carotid bruits noted. Pulmonary/Chest: Normal effort and positive vesicular breath sounds. No respiratory distress. No wheezes, rales or ronchi noted.  Abdomen: Soft and nontender. Normal bowel sounds. No distention or masses noted. Liver, spleen and kidneys non palpable. Musculoskeletal: Normal range of motion. No signs of joint swelling. No difficulty with gait.  Neurological: Alert and oriented. Cranial nerves II-XII grossly intact. Coordination  normal.  Psychiatric: Mood and affect normal. Behavior is normal. Judgment and thought content normal.   EKG:  BMET    Component Value Date/Time   NA 133 (L) 09/10/2017 1024   K 4.5 09/10/2017 1024   CL 99 09/10/2017 1024   CO2 28 09/10/2017 1024   GLUCOSE 71 09/10/2017 1024   BUN 8 09/10/2017 1024   CREATININE 0.71 09/10/2017 1024   CALCIUM 9.1 09/10/2017 1024   GFRNONAA >60 10/28/2016 0659   GFRAA >60 10/28/2016 0659    Lipid Panel     Component Value Date/Time   CHOL 168 05/21/2017 1422   TRIG 137.0 05/21/2017 1422   HDL 65.70 05/21/2017 1422   CHOLHDL 3 05/21/2017 1422   VLDL 27.4 05/21/2017 1422   LDLCALC 75 05/21/2017 1422    CBC    Component Value Date/Time   WBC 7.1 09/10/2017 1024   RBC 4.21 09/10/2017 1024   HGB 13.5 09/10/2017 1024   HCT 39.4 09/10/2017 1024   PLT 344.0 09/10/2017 1024   MCV 93.7 09/10/2017 1024   MCH 30.9 10/28/2016 0659   MCHC 34.4 09/10/2017 1024   RDW 14.6 09/10/2017 1024   LYMPHSABS 2.1 10/02/2016 0945   MONOABS 0.4 10/02/2016 0945   EOSABS 0.2 10/02/2016 0945   BASOSABS 0.0 10/02/2016 0945    Hgb A1C No results found for: HGBA1C      Assessment and Plan:   Follow Up Instructions:    I discussed the assessment and treatment plan with the patient. The patient was provided an opportunity to ask questions and all were answered. The patient agreed with the plan and demonstrated an understanding of the instructions.   The patient was advised to call back or seek an in-person evaluation if the symptoms worsen or if the condition fails to improve as anticipated.     Webb Silversmith, NP

## 2018-06-22 NOTE — Assessment & Plan Note (Signed)
She is weaning off MS Contin Continue Gabapentin and Zanaflex Encouraged routine stretching and regula exercise

## 2018-06-30 ENCOUNTER — Other Ambulatory Visit: Payer: Self-pay | Admitting: Internal Medicine

## 2018-06-30 ENCOUNTER — Telehealth (HOSPITAL_COMMUNITY): Payer: Self-pay

## 2018-06-30 DIAGNOSIS — Z1231 Encounter for screening mammogram for malignant neoplasm of breast: Secondary | ICD-10-CM

## 2018-06-30 NOTE — Telephone Encounter (Signed)
It is more likely caused by oxcarbazepine than lurasidone. I would suggest she should have sodium level repeated in 1-2 weeks and if still below 130 we will need to change Trileptal to a different mood stabilizer or just keep her on Latuda alone.  OP.

## 2018-06-30 NOTE — Telephone Encounter (Signed)
Patient called and she just had labs done, her doctor ran some labs and patients sodium is low. Her doctor said that it could be due to [place]. Patient would like to know if she should be concerned. Pleae review and advise, thank you

## 2018-07-01 ENCOUNTER — Other Ambulatory Visit (HOSPITAL_COMMUNITY): Payer: Self-pay | Admitting: Psychiatry

## 2018-07-01 MED ORDER — ZONISAMIDE 100 MG PO CAPS: 100.0000 mg | ORAL_CAPSULE | Freq: Every day | ORAL | 0 refills | Status: DC

## 2018-07-01 NOTE — Telephone Encounter (Signed)
I ordered zonisamide (Zonegran) instead - one tablet at night. No risk of weight gain (rather weight loss) or hyponatremia. Once she takes it for a day or two she should stop Trileptal.

## 2018-07-01 NOTE — Telephone Encounter (Signed)
I called patient back and she states that she is really feeling the effects of the low sodium, dizziness and nausea, she wants to know if you can change the medication now instead of waiting. She states that she does not want to be put on anything that could cause a lot of weight gain. Please review and advise, thank you

## 2018-07-02 NOTE — Telephone Encounter (Signed)
Patient picked up the medication and has started it, she will come by the office next week for a repeat CMP

## 2018-07-03 ENCOUNTER — Other Ambulatory Visit: Payer: Self-pay | Admitting: Internal Medicine

## 2018-07-03 DIAGNOSIS — G4733 Obstructive sleep apnea (adult) (pediatric): Secondary | ICD-10-CM | POA: Diagnosis not present

## 2018-07-05 NOTE — Addendum Note (Signed)
Addended by: Lurlean Nanny on: 07/05/2018 11:25 AM   Modules accepted: Orders

## 2018-07-07 ENCOUNTER — Other Ambulatory Visit: Payer: Self-pay

## 2018-07-07 ENCOUNTER — Other Ambulatory Visit (INDEPENDENT_AMBULATORY_CARE_PROVIDER_SITE_OTHER): Payer: Medicare HMO

## 2018-07-07 DIAGNOSIS — E871 Hypo-osmolality and hyponatremia: Secondary | ICD-10-CM

## 2018-07-07 DIAGNOSIS — Z85828 Personal history of other malignant neoplasm of skin: Secondary | ICD-10-CM | POA: Diagnosis not present

## 2018-07-07 DIAGNOSIS — Z08 Encounter for follow-up examination after completed treatment for malignant neoplasm: Secondary | ICD-10-CM | POA: Diagnosis not present

## 2018-07-07 DIAGNOSIS — L281 Prurigo nodularis: Secondary | ICD-10-CM | POA: Diagnosis not present

## 2018-07-07 LAB — BASIC METABOLIC PANEL
BUN: 16 mg/dL (ref 6–23)
CO2: 24 mEq/L (ref 19–32)
Calcium: 9.1 mg/dL (ref 8.4–10.5)
Chloride: 103 mEq/L (ref 96–112)
Creatinine, Ser: 0.78 mg/dL (ref 0.40–1.20)
GFR: 77.07 mL/min (ref >60.00)
Glucose, Bld: 91 mg/dL (ref 70–99)
Potassium: 4.1 mEq/L (ref 3.5–5.1)
Sodium: 135 mEq/L (ref 135–145)

## 2018-07-10 ENCOUNTER — Other Ambulatory Visit: Payer: Self-pay | Admitting: Gastroenterology

## 2018-07-10 DIAGNOSIS — R112 Nausea with vomiting, unspecified: Secondary | ICD-10-CM

## 2018-07-21 ENCOUNTER — Other Ambulatory Visit (HOSPITAL_COMMUNITY): Payer: Self-pay | Admitting: Psychiatry

## 2018-07-22 ENCOUNTER — Telehealth: Payer: Self-pay

## 2018-07-22 NOTE — Telephone Encounter (Signed)
Pt left v/m requesting cb about lab results earlier this month; pt wants to know what Na level was; pt wonders if symptoms she is having might be related to low NA.

## 2018-07-23 ENCOUNTER — Other Ambulatory Visit: Payer: Self-pay | Admitting: Internal Medicine

## 2018-07-23 NOTE — Telephone Encounter (Signed)
Spoke to pt about her lab results.

## 2018-07-27 ENCOUNTER — Other Ambulatory Visit (HOSPITAL_COMMUNITY): Payer: Self-pay

## 2018-07-27 MED ORDER — ZONISAMIDE 100 MG PO CAPS: 100.0000 mg | ORAL_CAPSULE | Freq: Every day | ORAL | 0 refills | Status: DC

## 2018-08-03 DIAGNOSIS — G4733 Obstructive sleep apnea (adult) (pediatric): Secondary | ICD-10-CM | POA: Diagnosis not present

## 2018-08-06 ENCOUNTER — Ambulatory Visit (INDEPENDENT_AMBULATORY_CARE_PROVIDER_SITE_OTHER): Payer: Medicare HMO | Admitting: Psychology

## 2018-08-06 DIAGNOSIS — F411 Generalized anxiety disorder: Secondary | ICD-10-CM

## 2018-08-06 DIAGNOSIS — F319 Bipolar disorder, unspecified: Secondary | ICD-10-CM | POA: Diagnosis not present

## 2018-08-16 ENCOUNTER — Other Ambulatory Visit: Payer: Self-pay

## 2018-08-16 ENCOUNTER — Ambulatory Visit (INDEPENDENT_AMBULATORY_CARE_PROVIDER_SITE_OTHER): Payer: Medicare HMO | Admitting: Psychiatry

## 2018-08-16 DIAGNOSIS — F3131 Bipolar disorder, current episode depressed, mild: Secondary | ICD-10-CM | POA: Diagnosis not present

## 2018-08-16 DIAGNOSIS — F411 Generalized anxiety disorder: Secondary | ICD-10-CM | POA: Diagnosis not present

## 2018-08-16 MED ORDER — LURASIDONE HCL 20 MG PO TABS: 20.0000 mg | ORAL_TABLET | Freq: Every day | ORAL | 2 refills | Status: DC

## 2018-08-16 MED ORDER — ZONISAMIDE 100 MG PO CAPS: 100.0000 mg | ORAL_CAPSULE | Freq: Every day | ORAL | 2 refills | Status: DC

## 2018-08-16 MED ORDER — TRAZODONE HCL 300 MG PO TABS: 300.0000 mg | ORAL_TABLET | Freq: Every evening | ORAL | 2 refills | Status: DC | PRN

## 2018-08-16 NOTE — Progress Notes (Signed)
BH MD/PA/NP OP Progress Note  08/16/2018 2:38 PM [name]  MRN:  [id number] Interview was conducted  By phone and I verified that I was speaking with the correct person using two identifiers. I discussed the limitations of evaluation and management by telemedicine and  the availability of in person appointments. Patient expressed understanding and agreed to proceed.  Chief Complaint: "I am doing better now".  HPI: 53 yo single female with long psychiatric history of mood fluctuations, past SI, impulsivity, irritability, insomnia -past hospitalizations. Her psychiatric diagnoses include Bipolar 1 disorder, most recent episode depressed, Borderline personality disorder; Cannabis use disorder and Alcohol use disorder in sustained remission.  She has stopped seeing providers at [facility] and came here instead - this is her second visit with me. She has been off psychotropic meds for over a year. Medical problems related to back pain, IBS. She is trying to come off MS Contin, stopped Manuela Neptune and is now on Zanaflex. Several medciation trials in the past - does not recall all meds she had been on. Very concerned about potential weight gain on meds (gained weight on Seroquel and Depakote). At her initial visit we decided to try a combination of lurasidone 20 mg and oxacarbazepine 150 mg bid - patient wanted to start on lowest doses possible. She developed hyponatremia and dizziness and it was discontinued. Zonisamide 100 mg at HS was started instead (her renal function appears normal). Hyponatremia and dizziness resolved. She reports that she felt depressed in April but mood also improved. Sleep is adequate after we added trazodone (300 mg ).   Visit Diagnosis:    ICD-10-CM   1. GAD (generalized anxiety disorder)  F41.1   2. Bipolar 1 disorder, depressed, mild (HCC)  F31.31     Past Psychiatric History: Please see intake H&P.  Past Medical History:  Past Medical History:  Diagnosis Date  . Anxiety   .  Arthritis    neck, knees, shoulders  . Asthma   . Bipolar disorder ([place])    currently feeling MANIC- 10/02/2016  . Depression   . GERD (gastroesophageal reflux disease)   . History of blood transfusion    as a newborn   . History of lump of left breast   . Hyperlipidemia   . Lumbar pseudoarthrosis   . Motion sickness    cars  . OSA (obstructive sleep apnea) 01/09/2016   can't afford CPAP  . Personality disorder ([place])   . PONV (postoperative nausea and vomiting)   . Post traumatic stress disorder (PTSD)   . Substance abuse ([place])   . Synovial cyst     Past Surgical History:  Procedure Laterality Date  . COLONOSCOPY WITH PROPOFOL N/A 11/04/2017   Procedure: COLONOSCOPY WITH PROPOFOL;  Surgeon: Lin Landsman, MD;  Location: [place];  Service: Endoscopy;  Laterality: N/A;  . ESOPHAGOGASTRODUODENOSCOPY (EGD) WITH PROPOFOL N/A 11/04/2017   Procedure: ESOPHAGOGASTRODUODENOSCOPY (EGD) WITH PROPOFOL with biopsies;  Surgeon: Lin Landsman, MD;  Location: [place];  Service: Endoscopy;  Laterality: N/A;  sleep apnea  . KNEE SURGERY Left    x5, post basketball injury  . LUMBAR LAMINECTOMY/DECOMPRESSION MICRODISCECTOMY Left 01/22/2016   Procedure: Laminectomy for facet/synovial cyst - left - Lumbar four - lumbar five;  Surgeon: Earnie Larsson, MD;  Location: [place];  Service: Neurosurgery;  Laterality: Left;  Laminectomy for facet/synovial cyst - left - Lumbar four - lumbar five  . POLYPECTOMY N/A 11/04/2017   Procedure: POLYPECTOMY INTESTINAL;  Surgeon: Lin Landsman,  MD;  Location: [place];  Service: Endoscopy;  Laterality: N/A;  . SHOULDER SURGERY Left    x2  . TOE SURGERY Bilateral    bone spurs    Family Psychiatric History: Reviewed.  Family History:  Family History  Problem Relation Age of Onset  . Heart disease Father   . Hyperlipidemia Father   . Alcohol abuse Brother   . Alcohol abuse Paternal Uncle   . Breast cancer Maternal Aunt         70's  . Colon cancer Neg Hx     Social History:  Social History   Socioeconomic History  . Marital status: Single    Spouse name: Not on file  . Number of children: 0  . Years of education: Not on file  . Highest education level: Bachelor's degree (e.g., BA, AB, BS)  Occupational History  . Not on file  Social Needs  . Financial resource strain: Not on file  . Food insecurity    Worry: Sometimes true    Inability: Not on file  . Transportation needs    Medical: No    Non-medical: No  Tobacco Use  . Smoking status: Former Smoker    Packs/day: 1.00    Years: 1.00    Pack years: 1.00    Types: Cigarettes    Quit date: 04/15/1996    Years since quitting: 22.3  . Smokeless tobacco: Never Used  . Tobacco comment: started back again in 2016, smoked for about 6 mo and then quit  Substance and Sexual Activity  . Alcohol use: No    Alcohol/week: 0.0 standard drinks    Comment: quit 20 years  . Drug use: Yes    Frequency: 14.0 times per week    Types: Marijuana    Comment: 20 yrs. ago- cocaine   . Sexual activity: Not Currently  Lifestyle  . Physical activity    Days per week: 0 days    Minutes per session: 0 min  . Stress: Very much  Relationships  . Social Herbalist on phone: Not on file    Gets together: Not on file    Attends religious service: 1 to 4 times per year    Active member of club or organization: Not on file    Attends meetings of clubs or organizations: Not on file    Relationship status: Never married  Other Topics Concern  . Not on file  Social History Narrative  . Not on file    Allergies:  Allergies  Allergen Reactions  . Effexor [Venlafaxine] Other (See Comments)    UNSPECIFIED REACTION, headaches, felt funny, withdrawal with missed dose   . Lamictal [Lamotrigine] Rash    Metabolic Disorder Labs: Lab Results  Component Value Date   HGBA1C 5.6 06/22/2018   No results found for: PROLACTIN Lab Results  Component Value  Date   CHOL 165 06/22/2018   TRIG 111.0 06/22/2018   HDL 63.90 06/22/2018   CHOLHDL 3 06/22/2018   VLDL 22.2 06/22/2018   LDLCALC 79 06/22/2018   LDLCALC 75 05/21/2017   Lab Results  Component Value Date   TSH 1.30 03/15/2015    Therapeutic Level Labs: No results found for: LITHIUM No results found for: VALPROATE No components found for:  CBMZ  Current Medications: Current Outpatient Medications  Medication Sig Dispense Refill  . aspirin 81 MG tablet Take 81 mg by mouth daily.    . diphenoxylate-atropine (LOMOTIL) 2.5-0.025 MG tablet Take 1 tablet  by mouth daily as needed for diarrhea or loose stools. 30 tablet 0  . gabapentin (NEURONTIN) 300 MG capsule Take 1 capsule by mouth 3 (three) times daily.    Marland Kitchen lurasidone (LATUDA) 20 MG TABS tablet Take 1 tablet (20 mg total) by mouth at bedtime. 30 tablet 2  . morphine (MS CONTIN) 15 MG 12 hr tablet Take 1 tablet by mouth 3 (three) times daily.    Marland Kitchen omeprazole (PRILOSEC) 20 MG capsule TAKE 1 CAPSULE (20 MG TOTAL) BY MOUTH DAILY BEFORE BREAKFAST. 90 capsule 0  . simvastatin (ZOCOR) 20 MG tablet Take 1 tablet (20 mg total) by mouth daily at 6 PM. 90 tablet 0  . tiZANidine (ZANAFLEX) 4 MG capsule Take 4 mg by mouth 3 (three) times daily.    . traZODone (DESYREL) 300 MG tablet Take 1 tablet (300 mg total) by mouth at bedtime as needed for sleep. 30 tablet 2  . VENTOLIN HFA 108 (90 Base) MCG/ACT inhaler TAKE 2 PUFFS BY MOUTH EVERY 6 HOURS AS NEEDED FOR WHEEZE OR SHORTNESS OF BREATH 18 g 1  . zonisamide (ZONEGRAN) 100 MG capsule Take 1 capsule (100 mg total) by mouth at bedtime for 30 days. 30 capsule 0   No current facility-administered medications for this visit.      Psychiatric Specialty Exam: Review of Systems  Musculoskeletal: Positive for back pain.  All other systems reviewed and are negative.   There were no vitals taken for this visit.There is no height or weight on file to calculate BMI.  General Appearance: NA  Eye  Contact:  NA  Speech:  Clear and Coherent  Volume:  Normal  Mood:  Euthymic  Affect:  NA  Thought Process:  Goal Directed  Orientation:  Full (Time, Place, and Person)  Thought Content: Logical   Suicidal Thoughts:  No  Homicidal Thoughts:  No  Memory:  Immediate;   Good Recent;   Good Remote;   Good  Judgement:  Fair  Insight:  Fair  Psychomotor Activity:  NA  Concentration:  Concentration: Good  Recall:  Good  Fund of Knowledge: Fair  Language: Good  Akathisia:  Negative  Handed:  Right  AIMS (if indicated): not done  Assets:  Communication Skills Desire for Improvement Financial Resources/Insurance Housing Resilience  ADL's:  Intact  Cognition: WNL  Sleep:  Good   Screenings: PHQ2-9     Office Visit from 06/22/2018 in [place] at [facility] Visit from 05/21/2017 in [facility] [place] at [facility] Visit from 09/12/2015 in [facility] at [place] from 09/12/2014 in [place] at [facility]  PHQ-2 Total Score  0  0  0  1       Assessment and Plan: 53 yo single female with long psychiatric history of mood fluctuations, past SI, impulsivity, irritability, insomnia -past hospitalizations. Her psychiatric diagnoses include Bipolar 1 disorder, most recent episode depressed, Borderline personality disorder; Cannabis use disorder and Alcohol use disorder in sustained remission.  She has stopped seeing providers at [facility] and came here instead - this is her second visit with me. She has been off psychotropic meds for over a year. Medical problems related to back pain, IBS. She is trying to come off MS Contin, stopped Manuela Neptune and is now on Zanaflex. Several medciation trials in the past - does not recall all meds she had been on. Very concerned about potential weight gain on meds (gained weight on Seroquel and Depakote). At her initial visit we  decided to try a combination of lurasidone 20 mg and oxacarbazepine 150 mg bid -  patient wanted to start on lowest doses possible. She developed hyponatremia and dizziness and it was discontinued. Zonisamide 100 mg at HS was started instead (her renal function appears normal). Hyponatremia and dizziness resolved. She reports that she felt depressed in April but mood also improved. Sleep is adequate after we added trazodone (300 mg ). We will therefore continue all meds uncharged for now. [place] will return to clinic in 3 months or prn.    Stephanie Acre, MD 08/16/2018, 2:38 PM

## 2018-08-18 ENCOUNTER — Ambulatory Visit (INDEPENDENT_AMBULATORY_CARE_PROVIDER_SITE_OTHER): Payer: Medicare HMO | Admitting: Internal Medicine

## 2018-08-18 ENCOUNTER — Encounter: Payer: Self-pay | Admitting: Internal Medicine

## 2018-08-18 DIAGNOSIS — R0789 Other chest pain: Secondary | ICD-10-CM | POA: Diagnosis not present

## 2018-08-18 DIAGNOSIS — R062 Wheezing: Secondary | ICD-10-CM | POA: Diagnosis not present

## 2018-08-18 DIAGNOSIS — R05 Cough: Secondary | ICD-10-CM | POA: Diagnosis not present

## 2018-08-18 DIAGNOSIS — R059 Cough, unspecified: Secondary | ICD-10-CM

## 2018-08-18 MED ORDER — PREDNISONE 10 MG PO TABS: ORAL_TABLET | ORAL | 0 refills | Status: DC

## 2018-08-18 MED ORDER — HYDROCODONE-HOMATROPINE 5-1.5 MG/5ML PO SYRP: 5.0000 mL | ORAL_SOLUTION | Freq: Three times a day (TID) | ORAL | 0 refills | Status: DC | PRN

## 2018-08-18 NOTE — Patient Instructions (Signed)

## 2018-08-18 NOTE — Progress Notes (Signed)
Virtual Visit via Video Note  I connected with [name] on 08/18/18 at  3:00 PM EDT by a video enabled telemedicine application and verified that I am speaking with the correct person using two identifiers.  Location: Patient: Car Provider: Home   I discussed the limitations of evaluation and management by telemedicine and the availability of in person appointments. The patient expressed understanding and agreed to proceed.  History of Present Illness:  Pt reports cough, wheezing and chest tightness. She reports this started 1 month ago. The cough is dry and non productive. She denies runny nose, nasal congestion, ear pain, sore throat or shortness of breath. She denies fever, chills or body aches. She has tried Zyrtec and Flonase with some relief. She has not had sick contacts that she is aware of. She does smoke.  Past Medical History:  Diagnosis Date  . Anxiety   . Arthritis    neck, knees, shoulders  . Asthma   . Bipolar disorder ([place])    currently feeling MANIC- 10/02/2016  . Depression   . GERD (gastroesophageal reflux disease)   . History of blood transfusion    as a newborn   . History of lump of left breast   . Hyperlipidemia   . Lumbar pseudoarthrosis   . Motion sickness    cars  . OSA (obstructive sleep apnea) 01/09/2016   can't afford CPAP  . Personality disorder ([place])   . PONV (postoperative nausea and vomiting)   . Post traumatic stress disorder (PTSD)   . Substance abuse ([place])   . Synovial cyst     Current Outpatient Medications  Medication Sig Dispense Refill  . aspirin 81 MG tablet Take 81 mg by mouth daily.    . diphenoxylate-atropine (LOMOTIL) 2.5-0.025 MG tablet Take 1 tablet by mouth daily as needed for diarrhea or loose stools. 30 tablet 0  . gabapentin (NEURONTIN) 300 MG capsule Take 1 capsule by mouth 3 (three) times daily.    Marland Kitchen lurasidone (LATUDA) 20 MG TABS tablet Take 1 tablet (20 mg total) by mouth at bedtime. 30 tablet 2  . morphine (MS  CONTIN) 15 MG 12 hr tablet Take 1 tablet by mouth 3 (three) times daily.    . simvastatin (ZOCOR) 20 MG tablet Take 1 tablet (20 mg total) by mouth daily at 6 PM. 90 tablet 0  . tiZANidine (ZANAFLEX) 4 MG capsule Take 4 mg by mouth 3 (three) times daily.    . trazodone (DESYREL) 300 MG tablet Take 1 tablet (300 mg total) by mouth at bedtime as needed for sleep. 30 tablet 2  . VENTOLIN HFA 108 (90 Base) MCG/ACT inhaler TAKE 2 PUFFS BY MOUTH EVERY 6 HOURS AS NEEDED FOR WHEEZE OR SHORTNESS OF BREATH 18 g 1  . zonisamide (ZONEGRAN) 100 MG capsule Take 1 capsule (100 mg total) by mouth at bedtime. 30 capsule 2  . omeprazole (PRILOSEC) 20 MG capsule TAKE 1 CAPSULE (20 MG TOTAL) BY MOUTH DAILY BEFORE BREAKFAST. 90 capsule 0   No current facility-administered medications for this visit.     Allergies  Allergen Reactions  . Effexor [Venlafaxine] Other (See Comments)    UNSPECIFIED REACTION, headaches, felt funny, withdrawal with missed dose   . Lamictal [Lamotrigine] Rash    Family History  Problem Relation Age of Onset  . Heart disease Father   . Hyperlipidemia Father   . Alcohol abuse Brother   . Alcohol abuse Paternal Uncle   . Breast cancer Maternal Aunt  70's  . Colon cancer Neg Hx     Social History   Socioeconomic History  . Marital status: Single    Spouse name: Not on file  . Number of children: 0  . Years of education: Not on file  . Highest education level: Bachelor's degree (e.g., BA, AB, BS)  Occupational History  . Not on file  Social Needs  . Financial resource strain: Not on file  . Food insecurity    Worry: Sometimes true    Inability: Not on file  . Transportation needs    Medical: No    Non-medical: No  Tobacco Use  . Smoking status: Former Smoker    Packs/day: 1.00    Years: 1.00    Pack years: 1.00    Types: Cigarettes    Quit date: 04/15/1996    Years since quitting: 22.3  . Smokeless tobacco: Never Used  . Tobacco comment: started back again  in 2016, smoked for about 6 mo and then quit  Substance and Sexual Activity  . Alcohol use: No    Alcohol/week: 0.0 standard drinks    Comment: quit 20 years  . Drug use: Yes    Frequency: 14.0 times per week    Types: Marijuana    Comment: 20 yrs. ago- cocaine   . Sexual activity: Not Currently  Lifestyle  . Physical activity    Days per week: 0 days    Minutes per session: 0 min  . Stress: Very much  Relationships  . Social Herbalist on phone: Not on file    Gets together: Not on file    Attends religious service: 1 to 4 times per year    Active member of club or organization: Not on file    Attends meetings of clubs or organizations: Not on file    Relationship status: Never married  . Intimate partner violence    Fear of current or ex partner: Not on file    Emotionally abused: Not on file    Physically abused: Not on file    Forced sexual activity: Not on file  Other Topics Concern  . Not on file  Social History Narrative  . Not on file     Constitutional: Denies fever, malaise, fatigue, headache or abrupt weight changes.  HEENT: Denies eye pain, eye redness, ear pain, ringing in the ears, wax buildup, runny nose, nasal congestion, bloody nose, or sore throat. Respiratory: Pt reports cough, wheezing. Denies difficulty breathing, shortness of breath, or sputum production.   Cardiovascular: Pt reports chest tightness. Denies chest pain, palpitations or swelling in the hands or feet.    No other specific complaints in a complete review of systems (except as listed in HPI above).  Wt Readings from Last 3 Encounters:  04/15/18 135 lb 6.4 oz (61.4 kg)  03/30/18 135 lb (61.2 kg)  03/16/18 137 lb (62.1 kg)    General: Appears her stated age, well developed, well nourished in NAD. HEENT: Head: normal shape and size; Eyes: sclera white, and EOMs intact;  Pulmonary/Chest: Normal effort. No respiratory distress.  Neurological: Alert and oriented.    BMET     Component Value Date/Time   NA 135 07/07/2018 0933   K 4.1 07/07/2018 0933   CL 103 07/07/2018 0933   CO2 24 07/07/2018 0933   GLUCOSE 91 07/07/2018 0933   BUN 16 07/07/2018 0933   CREATININE 0.78 07/07/2018 0933   CALCIUM 9.1 07/07/2018 0933   GFRNONAA >60 10/28/2016  8251   GFRAA >60 10/28/2016 0659    Lipid Panel     Component Value Date/Time   CHOL 165 06/22/2018 1437   TRIG 111.0 06/22/2018 1437   HDL 63.90 06/22/2018 1437   CHOLHDL 3 06/22/2018 1437   VLDL 22.2 06/22/2018 1437   LDLCALC 79 06/22/2018 1437    CBC    Component Value Date/Time   WBC 6.2 06/22/2018 1437   RBC 3.96 06/22/2018 1437   HGB 12.8 06/22/2018 1437   HCT 36.6 06/22/2018 1437   PLT 338.0 06/22/2018 1437   MCV 92.3 06/22/2018 1437   MCH 30.9 10/28/2016 0659   MCHC 34.9 06/22/2018 1437   RDW 13.9 06/22/2018 1437   LYMPHSABS 2.1 10/02/2016 0945   MONOABS 0.4 10/02/2016 0945   EOSABS 0.2 10/02/2016 0945   BASOSABS 0.0 10/02/2016 0945    Hgb A1C Lab Results  Component Value Date   HGBA1C 5.6 06/22/2018        Assessment and Plan:  Cough, Wheezing, Chest Tightness:  Acute bronchitis, vs RAD, allergy induced asthma RX for Pred Taper x 6 days Continue Zyrtec and Flonase OTC RX for Hycodan at bedtime as needed for cough  Return precautions discussed  Follow Up Instructions:    I discussed the assessment and treatment plan with the patient. The patient was provided an opportunity to ask questions and all were answered. The patient agreed with the plan and demonstrated an understanding of the instructions.   The patient was advised to call back or seek an in-person evaluation if the symptoms worsen or if the condition fails to improve as anticipated.   Webb Silversmith, NP

## 2018-08-23 ENCOUNTER — Ambulatory Visit
Admission: RE | Admit: 2018-08-23 | Discharge: 2018-08-23 | Disposition: A | Discharge status: Home / Self Care | Payer: Medicare HMO | Source: Ambulatory Visit | Attending: Internal Medicine | Admitting: Internal Medicine

## 2018-08-23 ENCOUNTER — Ambulatory Visit: Payer: Self-pay | Admitting: Psychology

## 2018-08-23 ENCOUNTER — Other Ambulatory Visit: Payer: Self-pay

## 2018-08-23 DIAGNOSIS — Z1231 Encounter for screening mammogram for malignant neoplasm of breast: Secondary | ICD-10-CM | POA: Diagnosis not present

## 2018-09-02 DIAGNOSIS — G4733 Obstructive sleep apnea (adult) (pediatric): Secondary | ICD-10-CM | POA: Diagnosis not present

## 2018-09-06 ENCOUNTER — Ambulatory Visit (INDEPENDENT_AMBULATORY_CARE_PROVIDER_SITE_OTHER): Payer: Medicare HMO | Admitting: Psychology

## 2018-09-06 DIAGNOSIS — F319 Bipolar disorder, unspecified: Secondary | ICD-10-CM

## 2018-09-06 DIAGNOSIS — F411 Generalized anxiety disorder: Secondary | ICD-10-CM

## 2018-09-12 ENCOUNTER — Other Ambulatory Visit: Payer: Self-pay | Admitting: Internal Medicine

## 2018-09-13 ENCOUNTER — Other Ambulatory Visit: Payer: Self-pay | Admitting: Internal Medicine

## 2018-09-20 ENCOUNTER — Ambulatory Visit (INDEPENDENT_AMBULATORY_CARE_PROVIDER_SITE_OTHER): Payer: Medicare HMO | Admitting: Psychology

## 2018-09-20 DIAGNOSIS — F411 Generalized anxiety disorder: Secondary | ICD-10-CM | POA: Diagnosis not present

## 2018-09-20 DIAGNOSIS — F319 Bipolar disorder, unspecified: Secondary | ICD-10-CM

## 2018-09-22 DIAGNOSIS — Z6826 Body mass index (BMI) 26.0-26.9, adult: Secondary | ICD-10-CM | POA: Diagnosis not present

## 2018-09-22 DIAGNOSIS — M4317 Spondylolisthesis, lumbosacral region: Secondary | ICD-10-CM | POA: Diagnosis not present

## 2018-09-22 DIAGNOSIS — R030 Elevated blood-pressure reading, without diagnosis of hypertension: Secondary | ICD-10-CM | POA: Diagnosis not present

## 2018-09-28 ENCOUNTER — Ambulatory Visit (INDEPENDENT_AMBULATORY_CARE_PROVIDER_SITE_OTHER): Payer: Medicare HMO | Admitting: Psychology

## 2018-09-28 DIAGNOSIS — F319 Bipolar disorder, unspecified: Secondary | ICD-10-CM | POA: Diagnosis not present

## 2018-09-28 DIAGNOSIS — F411 Generalized anxiety disorder: Secondary | ICD-10-CM

## 2018-10-03 DIAGNOSIS — G4733 Obstructive sleep apnea (adult) (pediatric): Secondary | ICD-10-CM | POA: Diagnosis not present

## 2018-10-06 ENCOUNTER — Other Ambulatory Visit: Payer: Self-pay | Admitting: Internal Medicine

## 2018-10-13 DIAGNOSIS — G4733 Obstructive sleep apnea (adult) (pediatric): Secondary | ICD-10-CM | POA: Diagnosis not present

## 2018-10-14 ENCOUNTER — Other Ambulatory Visit (HOSPITAL_COMMUNITY): Payer: Self-pay | Admitting: Psychiatry

## 2018-10-27 ENCOUNTER — Ambulatory Visit: Payer: Medicare HMO | Admitting: Psychology

## 2018-11-03 DIAGNOSIS — G4733 Obstructive sleep apnea (adult) (pediatric): Secondary | ICD-10-CM | POA: Diagnosis not present

## 2018-11-07 ENCOUNTER — Other Ambulatory Visit (HOSPITAL_COMMUNITY): Payer: Self-pay | Admitting: Psychiatry

## 2018-11-09 ENCOUNTER — Ambulatory Visit: Payer: Medicare HMO | Admitting: Psychology

## 2018-11-16 ENCOUNTER — Ambulatory Visit (INDEPENDENT_AMBULATORY_CARE_PROVIDER_SITE_OTHER): Payer: Medicare HMO | Admitting: Psychiatry

## 2018-11-16 ENCOUNTER — Other Ambulatory Visit: Payer: Self-pay

## 2018-11-16 DIAGNOSIS — F3131 Bipolar disorder, current episode depressed, mild: Secondary | ICD-10-CM

## 2018-11-16 DIAGNOSIS — F411 Generalized anxiety disorder: Secondary | ICD-10-CM

## 2018-11-16 MED ORDER — LURASIDONE HCL 40 MG PO TABS: 40.0000 mg | ORAL_TABLET | Freq: Every day | ORAL | 2 refills | Status: DC

## 2018-11-16 MED ORDER — TRAZODONE HCL 300 MG PO TABS: 300.0000 mg | ORAL_TABLET | Freq: Every evening | ORAL | 2 refills | Status: DC | PRN

## 2018-11-16 MED ORDER — ZONISAMIDE 100 MG PO CAPS: 100.0000 mg | ORAL_CAPSULE | Freq: Every day | ORAL | 2 refills | Status: DC

## 2018-11-16 NOTE — Progress Notes (Signed)
BH MD/PA/NP OP Progress Note  11/16/2018 2:38 PM [name]  MRN:  [id number] Interview was conducted by phone and I verified that I was speaking with the correct person using two identifiers. I discussed the limitations of evaluation and management by telemedicine and  the availability of in person appointments. Patient expressed understanding and agreed to proceed.  Chief Complaint: Increased depression.  HPI: 53 yo single female with bipolar 1 disorder, most recent episode depressed, borderline personality disorder; hx of cannabis use disorderand alcohol use disorder in sustained remission.She has stopped seeing providers at [facility] and came here instead. She has been off psychotropic meds for over a year. Medical problems related to back pain, IBS. She is trying to come off MS Contin, stopped Somaand is now on Zanaflex. Several medciation trials in the past - does not recall all meds she had been on. Very concerned about potential weight gain on meds (gained weight on Seroquel and Depakote).At her initial visit we decided to try a combination of lurasidone 20 mg and oxacarbazepine 150 mg bid - patient wanted to start on lowest doses possible. She developed hyponatremia and dizziness and it was discontinued. Zonisamide 100 mg at HS was started instead and hyponatremia and dizziness resolved. She reports that she felt depressed in April;  mood also improved until last few weeks when she began feeling more depressed, tearful. Sleep is adequate after we addedtrazodone(300 mg); she does not need it every night.     Visit Diagnosis:    ICD-10-CM   1. Bipolar 1 disorder, depressed, mild ([place])  F31.31   2. GAD (generalized anxiety disorder)  F41.1     Past Psychiatric History: Please see intake H&P.  Past Medical History:  Past Medical History:  Diagnosis Date  . Anxiety   . Arthritis    neck, knees, shoulders  . Asthma   . Bipolar disorder ([place])    currently feeling MANIC-  10/02/2016  . Depression   . GERD (gastroesophageal reflux disease)   . History of blood transfusion    as a newborn   . History of lump of left breast   . Hyperlipidemia   . Lumbar pseudoarthrosis   . Motion sickness    cars  . OSA (obstructive sleep apnea) 01/09/2016   can't afford CPAP  . Personality disorder ([place])   . PONV (postoperative nausea and vomiting)   . Post traumatic stress disorder (PTSD)   . Substance abuse ([place])   . Synovial cyst     Past Surgical History:  Procedure Laterality Date  . COLONOSCOPY WITH PROPOFOL N/A 11/04/2017   Procedure: COLONOSCOPY WITH PROPOFOL;  Surgeon: Lin Landsman, MD;  Location: [place];  Service: Endoscopy;  Laterality: N/A;  . ESOPHAGOGASTRODUODENOSCOPY (EGD) WITH PROPOFOL N/A 11/04/2017   Procedure: ESOPHAGOGASTRODUODENOSCOPY (EGD) WITH PROPOFOL with biopsies;  Surgeon: Lin Landsman, MD;  Location: [place];  Service: Endoscopy;  Laterality: N/A;  sleep apnea  . KNEE SURGERY Left    x5, post basketball injury  . LUMBAR LAMINECTOMY/DECOMPRESSION MICRODISCECTOMY Left 01/22/2016   Procedure: Laminectomy for facet/synovial cyst - left - Lumbar four - lumbar five;  Surgeon: Earnie Larsson, MD;  Location: [place];  Service: Neurosurgery;  Laterality: Left;  Laminectomy for facet/synovial cyst - left - Lumbar four - lumbar five  . POLYPECTOMY N/A 11/04/2017   Procedure: POLYPECTOMY INTESTINAL;  Surgeon: Lin Landsman, MD;  Location: [place];  Service: Endoscopy;  Laterality: N/A;  . SHOULDER SURGERY Left  x2  . TOE SURGERY Bilateral    bone spurs    Family Psychiatric History: Reviewed.  Family History:  Family History  Problem Relation Age of Onset  . Heart disease Father   . Hyperlipidemia Father   . Alcohol abuse Brother   . Alcohol abuse Paternal Uncle   . Breast cancer Maternal Aunt        70's  . Colon cancer Neg Hx     Social History:  Social History   Socioeconomic History  .  Marital status: Single    Spouse name: Not on file  . Number of children: 0  . Years of education: Not on file  . Highest education level: Bachelor's degree (e.g., BA, AB, BS)  Occupational History  . Not on file  Social Needs  . Financial resource strain: Not on file  . Food insecurity    Worry: Sometimes true    Inability: Not on file  . Transportation needs    Medical: No    Non-medical: No  Tobacco Use  . Smoking status: Former Smoker    Packs/day: 1.00    Years: 1.00    Pack years: 1.00    Types: Cigarettes    Quit date: 04/15/1996    Years since quitting: 22.6  . Smokeless tobacco: Never Used  . Tobacco comment: started back again in 2016, smoked for about 6 mo and then quit  Substance and Sexual Activity  . Alcohol use: No    Alcohol/week: 0.0 standard drinks    Comment: quit 20 years  . Drug use: Yes    Frequency: 14.0 times per week    Types: Marijuana    Comment: 20 yrs. ago- cocaine   . Sexual activity: Not Currently  Lifestyle  . Physical activity    Days per week: 0 days    Minutes per session: 0 min  . Stress: Very much  Relationships  . Social Herbalist on phone: Not on file    Gets together: Not on file    Attends religious service: 1 to 4 times per year    Active member of club or organization: Not on file    Attends meetings of clubs or organizations: Not on file    Relationship status: Never married  Other Topics Concern  . Not on file  Social History Narrative  . Not on file    Allergies:  Allergies  Allergen Reactions  . Effexor [Venlafaxine] Other (See Comments)    UNSPECIFIED REACTION, headaches, felt funny, withdrawal with missed dose   . Lamictal [Lamotrigine] Rash    Metabolic Disorder Labs: Lab Results  Component Value Date   HGBA1C 5.6 06/22/2018   No results found for: PROLACTIN Lab Results  Component Value Date   CHOL 165 06/22/2018   TRIG 111.0 06/22/2018   HDL 63.90 06/22/2018   CHOLHDL 3 06/22/2018    VLDL 22.2 06/22/2018   LDLCALC 79 06/22/2018   LDLCALC 75 05/21/2017   Lab Results  Component Value Date   TSH 1.30 03/15/2015    Therapeutic Level Labs: No results found for: LITHIUM No results found for: VALPROATE No components found for:  CBMZ  Current Medications: Current Outpatient Medications  Medication Sig Dispense Refill  . albuterol (VENTOLIN HFA) 108 (90 Base) MCG/ACT inhaler TAKE 2 PUFFS BY MOUTH EVERY 6 HOURS AS NEEDED FOR WHEEZE OR SHORTNESS OF BREATH 18 g 1  . aspirin 81 MG tablet Take 81 mg by mouth daily.    Marland Kitchen  diphenoxylate-atropine (LOMOTIL) 2.5-0.025 MG tablet Take 1 tablet by mouth daily as needed for diarrhea or loose stools. 30 tablet 0  . gabapentin (NEURONTIN) 300 MG capsule Take 1 capsule by mouth 3 (three) times daily.    Marland Kitchen HYDROcodone-homatropine (HYCODAN) 5-1.5 MG/5ML syrup Take 5 mLs by mouth every 8 (eight) hours as needed for cough. 120 mL 0  . LATUDA 20 MG TABS tablet TAKE 1 TABLET BY MOUTH EVERYDAY AT BEDTIME 30 tablet 2  . morphine (MS CONTIN) 15 MG 12 hr tablet Take 1 tablet by mouth 3 (three) times daily.    Marland Kitchen omeprazole (PRILOSEC) 20 MG capsule TAKE 1 CAPSULE (20 MG TOTAL) BY MOUTH DAILY BEFORE BREAKFAST. 90 capsule 0  . predniSONE (DELTASONE) 10 MG tablet Take 3 tabs on days 1-2, take 2 tabs on days 3-4, take 1 tab on days 5-6 12 tablet 0  . simvastatin (ZOCOR) 20 MG tablet TAKE 1 TABLET (20 MG TOTAL) BY MOUTH DAILY AT 6 PM. 90 tablet 0  . tiZANidine (ZANAFLEX) 4 MG capsule Take 4 mg by mouth 3 (three) times daily.    . trazodone (DESYREL) 300 MG tablet Take 1 tablet (300 mg total) by mouth at bedtime as needed for sleep. 30 tablet 2  . zonisamide (ZONEGRAN) 100 MG capsule TAKE 1 CAPSULE (100 MG TOTAL) BY MOUTH AT BEDTIME. 30 capsule 2   No current facility-administered medications for this visit.       Psychiatric Specialty Exam: Review of Systems  Musculoskeletal: Positive for back pain.  Psychiatric/Behavioral: Positive for depression.   All other systems reviewed and are negative.   There were no vitals taken for this visit.There is no height or weight on file to calculate BMI.  General Appearance: NA  Eye Contact:  NA  Speech:  Clear and Coherent and Normal Rate  Volume:  Normal  Mood:  Depressed  Affect:  NA  Thought Process:  Goal Directed and Linear  Orientation:  Full (Time, Place, and Person)  Thought Content: Logical   Suicidal Thoughts:  No  Homicidal Thoughts:  No  Memory:  Immediate;   Good Recent;   Good Remote;   Good  Judgement:  Good  Insight:  Good  Psychomotor Activity:  NA  Concentration:  Concentration: Good  Recall:  Good  Fund of Knowledge: Good  Language: Good  Akathisia:  Negative  Handed:  Right  AIMS (if indicated): not done  Assets:  Communication Skills Desire for Improvement Housing Resilience  ADL's:  Intact  Cognition: WNL  Sleep:  Good   Screenings: PHQ2-9     Office Visit from 06/22/2018 in [place] at [facility] Visit from 05/21/2017 in [place] at [facility] Visit from 09/12/2015 in [place] at [facility] Visit from 09/12/2014 in [place] at [facility]  PHQ-2 Total Score  0  0  0  1       Assessment and Plan: 53 yo single female with bipolar 1 disorder, most recent episode depressed, borderline personality disorder; hx of cannabis use disorderand alcohol use disorder in sustained remission.She has stopped seeing providers at [facility] and came here instead. She has been off psychotropic meds for over a year. Medical problems related to back pain, IBS. She is trying to come off MS Contin, stopped Somaand is now on Zanaflex. Several medciation trials in the past - does not recall all meds she had been on. Very concerned about potential weight gain on meds (gained weight on Seroquel  and Depakote).At her initial visit we decided to try a combination of lurasidone 20 mg and oxacarbazepine 150 mg bid - patient  wanted to start on lowest doses possible. She developed hyponatremia and dizziness and it was discontinued. Zonisamide 100 mg at HS was started instead and hyponatremia and dizziness resolved. She reports that she felt depressed in April;  mood also improved until last few weeks when she began feeling more depressed, tearful. Sleep is adequate after we addedtrazodone(300 mg); she does not need it every night.   Dx; Bipolar 1 disorder depressed mild, GAD  Plan: We will increase Latuda to 40 mg and continue trazodone and zonisamide unchanged. Cleopha will return to clinic in 3 months or prn. She would like to change a therapist and will call our office to request one. The plan was discussed with patient who had an opportunity to ask questions and these were all answered. I spend 25 minutes in phone consultation with the patient.    Stephanie Acre, MD 11/16/2018, 2:38 PM

## 2018-11-22 ENCOUNTER — Encounter: Payer: Self-pay | Admitting: Internal Medicine

## 2018-11-22 ENCOUNTER — Ambulatory Visit (INDEPENDENT_AMBULATORY_CARE_PROVIDER_SITE_OTHER): Payer: Medicare HMO | Admitting: Internal Medicine

## 2018-11-22 ENCOUNTER — Telehealth: Payer: Self-pay

## 2018-11-22 DIAGNOSIS — J45901 Unspecified asthma with (acute) exacerbation: Secondary | ICD-10-CM | POA: Insufficient documentation

## 2018-11-22 DIAGNOSIS — J4521 Mild intermittent asthma with (acute) exacerbation: Secondary | ICD-10-CM | POA: Diagnosis not present

## 2018-11-22 MED ORDER — PREDNISONE 20 MG PO TABS: 40.0000 mg | ORAL_TABLET | Freq: Every day | ORAL | 0 refills | Status: DC

## 2018-11-22 MED ORDER — HYDROCODONE-HOMATROPINE 5-1.5 MG/5ML PO SYRP: 5.0000 mL | ORAL_SOLUTION | Freq: Every evening | ORAL | 0 refills | Status: DC | PRN

## 2018-11-22 NOTE — Telephone Encounter (Signed)
Will evaluate at visit Steroid burst may well be appropriate

## 2018-11-22 NOTE — Assessment & Plan Note (Signed)
She mentions COPD but I don't think that diagnosis has ever been formally made She has her typical wheezing and increased SOB It seems reasonable to try the prednisone burst and cough syrup (she states she doesn't have a contract with Dr Annette Stable and is coming down off her morphine) No clear bacterial infection Doubt COVID-but she doesn't go out anyway, so no reason to check for now  May want to consider restarting Advair that she took in the past, given the repeated flares

## 2018-11-22 NOTE — Telephone Encounter (Signed)
Pt said has allergies all year and asthma on and off; pt having prod cough with white to brown phlegm;some wheezing. Pt has diarrhea but pt said has irritable bowel. No CP,SOB,Dizziness. No fever, chills, S/T, muscle pain,SOB,H/a, and no loss of taste or smell.No travel and no known exposure to covid. Pt said R Baity NP usually gives her steroid and that takes care of symptoms. Pt scheduled virtual visit today at 12:15 with Dr Silvio Pate. UC & ED precautions given and pt voiced understanding.

## 2018-11-22 NOTE — Progress Notes (Signed)
Subjective:    Patient ID: [name], female    DOB: 1965-10-25, 53 y.o.   MRN: [id number]  HPI Virtual visit due to respiratory difficulty Identification done Reviewed billing and she gave consent She is at home and I am in my office  Notes a history of COPD Now feeling the wheezing and SOB Feels she needs steroid now Has noticed a change in the past few days No fever Cough---occasional white or brown sputum  Chronic allergy problems Also asthma ---diagnosed since being adult (but no mention on Dr Bari Mantis last note) Has inhaler and nebulizer for prn use Past use of advair---stopped when she ran out of insurance (and hasn't gone back on)  Current Outpatient Medications on File Prior to Visit  Medication Sig Dispense Refill  . albuterol (VENTOLIN HFA) 108 (90 Base) MCG/ACT inhaler TAKE 2 PUFFS BY MOUTH EVERY 6 HOURS AS NEEDED FOR WHEEZE OR SHORTNESS OF BREATH 18 g 1  . aspirin 81 MG tablet Take 81 mg by mouth daily.    . diphenoxylate-atropine (LOMOTIL) 2.5-0.025 MG tablet Take 1 tablet by mouth daily as needed for diarrhea or loose stools. 30 tablet 0  . gabapentin (NEURONTIN) 300 MG capsule Take 1 capsule by mouth 3 (three) times daily.    Marland Kitchen lurasidone (LATUDA) 40 MG TABS tablet Take 1 tablet (40 mg total) by mouth daily with supper. 30 tablet 2  . morphine (MS CONTIN) 15 MG 12 hr tablet Take 1 tablet by mouth 3 (three) times daily.    . simvastatin (ZOCOR) 20 MG tablet TAKE 1 TABLET (20 MG TOTAL) BY MOUTH DAILY AT 6 PM. 90 tablet 0  . tiZANidine (ZANAFLEX) 4 MG capsule Take 4 mg by mouth 3 (three) times daily.    . trazodone (DESYREL) 300 MG tablet Take 1 tablet (300 mg total) by mouth at bedtime as needed for sleep. 30 tablet 2  . zonisamide (ZONEGRAN) 100 MG capsule Take 1 capsule (100 mg total) by mouth at bedtime. 30 capsule 2   No current facility-administered medications on file prior to visit.     Allergies  Allergen Reactions  . Effexor [Venlafaxine] Other (See  Comments)    UNSPECIFIED REACTION, headaches, felt funny, withdrawal with missed dose   . Lamictal [Lamotrigine] Rash    Past Medical History:  Diagnosis Date  . Anxiety   . Arthritis    neck, knees, shoulders  . Asthma   . Bipolar disorder ([place])    currently feeling MANIC- 10/02/2016  . Depression   . GERD (gastroesophageal reflux disease)   . History of blood transfusion    as a newborn   . History of lump of left breast   . Hyperlipidemia   . Lumbar pseudoarthrosis   . Motion sickness    cars  . OSA (obstructive sleep apnea) 01/09/2016   can't afford CPAP  . Personality disorder ([place])   . PONV (postoperative nausea and vomiting)   . Post traumatic stress disorder (PTSD)   . Substance abuse ([place])   . Synovial cyst     Past Surgical History:  Procedure Laterality Date  . COLONOSCOPY WITH PROPOFOL N/A 11/04/2017   Procedure: COLONOSCOPY WITH PROPOFOL;  Surgeon: Lin Landsman, MD;  Location: [facility];  Service: Endoscopy;  Laterality: N/A;  . ESOPHAGOGASTRODUODENOSCOPY (EGD) WITH PROPOFOL N/A 11/04/2017   Procedure: ESOPHAGOGASTRODUODENOSCOPY (EGD) WITH PROPOFOL with biopsies;  Surgeon: Lin Landsman, MD;  Location: [facility] [place];  Service: Endoscopy;  Laterality: N/A;  sleep  apnea  . KNEE SURGERY Left    x5, post basketball injury  . LUMBAR LAMINECTOMY/DECOMPRESSION MICRODISCECTOMY Left 01/22/2016   Procedure: Laminectomy for facet/synovial cyst - left - Lumbar four - lumbar five;  Surgeon: Earnie Larsson, MD;  Location: [facility];  Service: Neurosurgery;  Laterality: Left;  Laminectomy for facet/synovial cyst - left - Lumbar four - lumbar five  . POLYPECTOMY N/A 11/04/2017   Procedure: POLYPECTOMY INTESTINAL;  Surgeon: Lin Landsman, MD;  Location: [facility];  Service: Endoscopy;  Laterality: N/A;  . SHOULDER SURGERY Left    x2  . TOE SURGERY Bilateral    bone spurs    Family History  Problem Relation Age of Onset  . Heart disease Father    . Hyperlipidemia Father   . Alcohol abuse Brother   . Alcohol abuse Paternal Uncle   . Breast cancer Maternal Aunt        70's  . Colon cancer Neg Hx     Social History   Socioeconomic History  . Marital status: Single    Spouse name: Not on file  . Number of children: 0  . Years of education: Not on file  . Highest education level: Bachelor's degree (e.g., BA, AB, BS)  Occupational History  . Not on file  Social Needs  . Financial resource strain: Not on file  . Food insecurity    Worry: Sometimes true    Inability: Not on file  . Transportation needs    Medical: No    Non-medical: No  Tobacco Use  . Smoking status: Former Smoker    Packs/day: 1.00    Years: 1.00    Pack years: 1.00    Types: Cigarettes    Quit date: 04/15/1996    Years since quitting: 22.6  . Smokeless tobacco: Never Used  . Tobacco comment: started back again in 2016, smoked for about 6 mo and then quit  Substance and Sexual Activity  . Alcohol use: No    Alcohol/week: 0.0 standard drinks    Comment: quit 20 years  . Drug use: Yes    Frequency: 14.0 times per week    Types: Marijuana    Comment: 20 yrs. ago- cocaine   . Sexual activity: Not Currently  Lifestyle  . Physical activity    Days per week: 0 days    Minutes per session: 0 min  . Stress: Very much  Relationships  . Social Herbalist on phone: Not on file    Gets together: Not on file    Attends religious service: 1 to 4 times per year    Active member of club or organization: Not on file    Attends meetings of clubs or organizations: Not on file    Relationship status: Never married  . Intimate partner violence    Fear of current or ex partner: Not on file    Emotionally abused: Not on file    Physically abused: Not on file    Forced sexual activity: Not on file  Other Topics Concern  . Not on file  Social History Narrative  . Not on file   Review of Systems Chronic disability---generally stays to herself  Chronic poor smell--no change Taste is okay No vomiting Chronic IBS---no change (loose in AM and then clears)    Objective:   Physical Exam  Constitutional: She appears well-developed. No distress.  Respiratory: Effort normal. No respiratory distress.  Assessment & Plan:

## 2018-12-03 DIAGNOSIS — G4733 Obstructive sleep apnea (adult) (pediatric): Secondary | ICD-10-CM | POA: Diagnosis not present

## 2018-12-10 ENCOUNTER — Other Ambulatory Visit: Payer: Self-pay

## 2018-12-10 ENCOUNTER — Encounter: Payer: Self-pay | Admitting: Internal Medicine

## 2018-12-10 ENCOUNTER — Ambulatory Visit (INDEPENDENT_AMBULATORY_CARE_PROVIDER_SITE_OTHER): Payer: Medicare HMO | Admitting: Internal Medicine

## 2018-12-10 VITALS — BP 130/82 | HR 98 | Temp 98.3°F | Wt 137.0 lb

## 2018-12-10 DIAGNOSIS — Z23 Encounter for immunization: Secondary | ICD-10-CM | POA: Diagnosis not present

## 2018-12-10 DIAGNOSIS — S91332A Puncture wound without foreign body, left foot, initial encounter: Secondary | ICD-10-CM | POA: Diagnosis not present

## 2018-12-10 DIAGNOSIS — J4530 Mild persistent asthma, uncomplicated: Secondary | ICD-10-CM

## 2018-12-10 MED ORDER — FLUTICASONE-SALMETEROL 100-50 MCG/DOSE IN AEPB: 1.0000 | INHALATION_SPRAY | Freq: Two times a day (BID) | RESPIRATORY_TRACT | 3 refills | Status: DC

## 2018-12-10 NOTE — Progress Notes (Signed)
Subjective:    Patient ID: [name], female    DOB: 05-14-1965, 53 y.o.   MRN: [id number]  HPI  Pt presents to the clinic with c/o "steppipng on a nail". This occurred last night. She reports it penetrated her heel. She washed it with Peroxide. She is unsure the last time she had a tetanus vaccine but reports it has been more than 5 years ago. She has no history of diabetes.  She also reports persistent cough and mild shortness of breath. She is using her Albuterol 4-5 times per day. She was on Advair in the past and thinks she needs to be restarted. She is a former smoker but smokes THC daily. She is not seeing a pulmonologist.  Review of Systems      Past Medical History:  Diagnosis Date  . Anxiety   . Arthritis    neck, knees, shoulders  . Asthma   . Bipolar disorder ([place])    currently feeling MANIC- 10/02/2016  . Depression   . GERD (gastroesophageal reflux disease)   . History of blood transfusion    as a newborn   . History of lump of left breast   . Hyperlipidemia   . Lumbar pseudoarthrosis   . Motion sickness    cars  . OSA (obstructive sleep apnea) 01/09/2016   can't afford CPAP  . Personality disorder ([place])   . PONV (postoperative nausea and vomiting)   . Post traumatic stress disorder (PTSD)   . Substance abuse ([place])   . Synovial cyst     Current Outpatient Medications  Medication Sig Dispense Refill  . albuterol (VENTOLIN HFA) 108 (90 Base) MCG/ACT inhaler TAKE 2 PUFFS BY MOUTH EVERY 6 HOURS AS NEEDED FOR WHEEZE OR SHORTNESS OF BREATH 18 g 1  . aspirin 81 MG tablet Take 81 mg by mouth daily.    . diphenoxylate-atropine (LOMOTIL) 2.5-0.025 MG tablet Take 1 tablet by mouth daily as needed for diarrhea or loose stools. 30 tablet 0  . gabapentin (NEURONTIN) 300 MG capsule Take 1 capsule by mouth 3 (three) times daily.    Marland Kitchen HYDROcodone-homatropine (HYCODAN) 5-1.5 MG/5ML syrup Take 5 mLs by mouth at bedtime as needed for cough. 120 mL 0  . lurasidone (LATUDA)  40 MG TABS tablet Take 1 tablet (40 mg total) by mouth daily with supper. 30 tablet 2  . morphine (MS CONTIN) 15 MG 12 hr tablet Take 1 tablet by mouth 3 (three) times daily.    . predniSONE (DELTASONE) 20 MG tablet Take 2 tablets (40 mg total) by mouth daily. For 3 days, then 1 daily for 3 days 9 tablet 0  . simvastatin (ZOCOR) 20 MG tablet TAKE 1 TABLET (20 MG TOTAL) BY MOUTH DAILY AT 6 PM. 90 tablet 0  . tiZANidine (ZANAFLEX) 4 MG capsule Take 4 mg by mouth 3 (three) times daily.    . trazodone (DESYREL) 300 MG tablet Take 1 tablet (300 mg total) by mouth at bedtime as needed for sleep. 30 tablet 2  . zonisamide (ZONEGRAN) 100 MG capsule Take 1 capsule (100 mg total) by mouth at bedtime. 30 capsule 2   No current facility-administered medications for this visit.     Allergies  Allergen Reactions  . Effexor [Venlafaxine] Other (See Comments)    UNSPECIFIED REACTION, headaches, felt funny, withdrawal with missed dose   . Lamictal [Lamotrigine] Rash    Family History  Problem Relation Age of Onset  . Heart disease Father   . Hyperlipidemia  Father   . Alcohol abuse Brother   . Alcohol abuse Paternal Uncle   . Breast cancer Maternal Aunt        70's  . Colon cancer Neg Hx     Social History   Socioeconomic History  . Marital status: Single    Spouse name: Not on file  . Number of children: 0  . Years of education: Not on file  . Highest education level: Bachelor's degree (e.g., BA, AB, BS)  Occupational History  . Not on file  Social Needs  . Financial resource strain: Not on file  . Food insecurity    Worry: Sometimes true    Inability: Not on file  . Transportation needs    Medical: No    Non-medical: No  Tobacco Use  . Smoking status: Former Smoker    Packs/day: 1.00    Years: 1.00    Pack years: 1.00    Types: Cigarettes    Quit date: 04/15/1996    Years since quitting: 22.6  . Smokeless tobacco: Never Used  . Tobacco comment: started back again in 2016,  smoked for about 6 mo and then quit  Substance and Sexual Activity  . Alcohol use: No    Alcohol/week: 0.0 standard drinks    Comment: quit 20 years  . Drug use: Yes    Frequency: 14.0 times per week    Types: Marijuana    Comment: 20 yrs. ago- cocaine   . Sexual activity: Not Currently  Lifestyle  . Physical activity    Days per week: 0 days    Minutes per session: 0 min  . Stress: Very much  Relationships  . Social Herbalist on phone: Not on file    Gets together: Not on file    Attends religious service: 1 to 4 times per year    Active member of club or organization: Not on file    Attends meetings of clubs or organizations: Not on file    Relationship status: Never married  . Intimate partner violence    Fear of current or ex partner: Not on file    Emotionally abused: Not on file    Physically abused: Not on file    Forced sexual activity: Not on file  Other Topics Concern  . Not on file  Social History Narrative  . Not on file     Constitutional: Denies fever, malaise, fatigue, headache or abrupt weight changes.  HEENT: Denies eye pain, eye redness, ear pain, ringing in the ears, wax buildup, runny nose, nasal congestion, bloody nose, or sore throat. Respiratory: Pt reports cough and shortness of breath. Denies difficulty breathing, cough or sputum production.   Cardiovascular: Denies chest pain, chest tightness, palpitations or swelling in the hands or feet.  Musculoskeletal: Denies decrease in range of motion, difficulty with gait, muscle pain or joint pain and swelling.  Skin: Pt reports puncture wound of foot. Denies rashes, lesions or ulcercations.    No other specific complaints in a complete review of systems (except as listed in HPI above).  Objective:   Physical Exam  BP 130/82   Pulse 98   Temp 98.3 F (36.8 C) (Temporal)   Wt 137 lb (62.1 kg)   SpO2 98%   BMI 25.89 kg/m  Wt Readings from Last 3 Encounters:  12/10/18 137 lb (62.1 kg)   11/22/18 134 lb (60.8 kg)  04/15/18 135 lb 6.4 oz (61.4 kg)    General: Appears their  stated age, well developed, well nourished in NAD. Skin: Warm, dry. Puncture wound noted of left heel. No redness, swelling or drainage. Cardiovascular: Normal rate and rhythm. S1,S2 noted.  No murmur, rubs or gallops noted. No JVD or BLE edema. No carotid bruits noted. Pulmonary/Chest: Normal effort and positive vesicular breath sounds with intermittent bilateral expiratory wheezes. No respiratory distress. No rales or ronchi noted.  Musculoskeletal: Normal flexion, extension and rotation of the left ankle. No difficulty with gait.  Neurological: Alert and oriented.   BMET    Component Value Date/Time   NA 135 07/07/2018 0933   K 4.1 07/07/2018 0933   CL 103 07/07/2018 0933   CO2 24 07/07/2018 0933   GLUCOSE 91 07/07/2018 0933   BUN 16 07/07/2018 0933   CREATININE 0.78 07/07/2018 0933   CALCIUM 9.1 07/07/2018 0933   GFRNONAA >60 10/28/2016 0659   GFRAA >60 10/28/2016 0659    Lipid Panel     Component Value Date/Time   CHOL 165 06/22/2018 1437   TRIG 111.0 06/22/2018 1437   HDL 63.90 06/22/2018 1437   CHOLHDL 3 06/22/2018 1437   VLDL 22.2 06/22/2018 1437   LDLCALC 79 06/22/2018 1437    CBC    Component Value Date/Time   WBC 6.2 06/22/2018 1437   RBC 3.96 06/22/2018 1437   HGB 12.8 06/22/2018 1437   HCT 36.6 06/22/2018 1437   PLT 338.0 06/22/2018 1437   MCV 92.3 06/22/2018 1437   MCH 30.9 10/28/2016 0659   MCHC 34.9 06/22/2018 1437   RDW 13.9 06/22/2018 1437   LYMPHSABS 2.1 10/02/2016 0945   MONOABS 0.4 10/02/2016 0945   EOSABS 0.2 10/02/2016 0945   BASOSABS 0.0 10/02/2016 0945    Hgb A1C Lab Results  Component Value Date   HGBA1C 5.6 06/22/2018           Assessment & Plan:   Puncture Wound of Heel:  Tdap today No s/s of infection Clean with warm water and soap daily Can apply Neosporin BID to prevent infection  Asthma, Mild Persistent:  Will restart  Advair 100-50 Advised Albuterol prn if using > 2 days per week, I need to know Encouraged smoking cessation  Return precautions discussed Webb Silversmith, NP

## 2018-12-10 NOTE — Patient Instructions (Signed)
Puncture Wound A puncture wound is an injury that is caused by a sharp, thin object that goes through your skin. A puncture wound usually does not leave a large opening in your skin, so it may not bleed a lot. However, when you get a puncture wound, dirt or other materials (foreign bodies) can be forced into your wound and can break off inside. This increases the chance of infection, such as tetanus. There are many sharp, pointed objects that can cause puncture wounds, including teeth, nails, splinters of glass, fishhooks, and needles. Treatment may include the following steps:  Washing out the wound with a germ-free (sterile) salt-water solution.  Having surgery to open the wound and remove materials from it.  Closing the wound with stitches (sutures).  Covering the wound with antibiotic ointment and a bandage (dressing). Depending on what caused the injury, you may also need a tetanus shot or a rabies shot. Follow these instructions at home: Medicines  Take or apply over-the-counter and prescription medicines only as told by your doctor.  If you were prescribed an antibiotic medicine, take or apply it as told by your doctor. Do not stop using the antibiotic even if your condition starts to get better. Bathing  Keep the bandage dry as told by your doctor.  Do not take baths, swim, or use a hot tub until your doctor approves. Ask your doctor if you may take showers. You may only be allowed to take sponge baths. Wound care   There are many ways to close and cover a wound. For example, a wound can be closed with stitches, skin glue, or skin tape (adhesive strips). Follow instructions from your doctor about how to take care of your wound. Make sure you: ? Wash your hands with soap and water before and after you change your bandage. If you cannot use soap and water, use hand sanitizer. ? Change your bandage as told by your doctor. ? Leave stitches, skin glue, or skin tape strips in place.  They may need to stay in place for 2 weeks or longer. If tape strips get loose and curl up, you may trim the loose edges. Do not remove tape strips completely unless your doctor says it is okay.  Clean the wound as told by your doctor.  Do not scratch or pick at the wound.  Check your wound every day for signs of infection. Watch for: ? Redness, swelling, or pain. ? Fluid or blood. ? Warmth. ? Pus or a bad smell. General instructions  Raise (elevate) the injured area above the level of your heart while you are sitting or lying down.  If your puncture wound is in your foot, ask your doctor if you need to avoid putting weight on your foot and for how long. Use crutches as told by your doctor.  Keep all follow-up visits as told by your doctor. This is important. Contact a doctor if:  You got a tetanus shot and you have any of these problems at the injection site: ? Swelling. ? Very bad pain. ? Redness. ? Bleeding.  You have a fever.  Your stitches come out.  You notice a bad smell coming from your wound or your bandage.  You notice something coming out of the wound, such as wood or glass.  Medicine does not help your pain.  You have more redness, swelling, or pain at the site of your wound.  You have fluid, blood, or pus coming from your wound.  You notice   a change in the color of your skin near your wound.  You need to change the bandage often because fluid, blood, or pus is coming from the wound.  You start to have a new rash.  You start to lose feeling (have numbness) around the wound.  You have warmth around your wound. Get help right away if:  You have very bad swelling around the wound.  Your pain quickly gets worse and is very bad.  You start to get painful skin lumps.  You have a red streak going away from your wound.  The wound is on your hand or foot and you: ? Cannot move a finger or toe like normal. ? Notice that your fingers or toes look pale or  blue. Summary  A puncture wound is an injury that is caused by a sharp, thin object that goes through your skin.  Treatment may include washing out the wound, having surgery to open the wound to clean it, closing the wound, and covering the wound with a bandage.  Follow instructions from your doctor about how to take care of your wound.  Contact your doctor if you have more redness, swelling, or pain at the site of your wound.  Keep all follow-up visits as told by your doctor. This is important. This information is not intended to replace advice given to you by your health care provider. Make sure you discuss any questions you have with your health care provider. Document Released: 11/27/2007 Document Revised: 09/24/2017 Document Reviewed: 09/24/2017 Elsevier Patient Education  2020 [employer]  

## 2018-12-10 NOTE — Addendum Note (Signed)
Addended by: Lurlean Nanny on: 12/10/2018 03:51 PM   Modules accepted: Orders

## 2018-12-14 ENCOUNTER — Other Ambulatory Visit: Payer: Self-pay | Admitting: Internal Medicine

## 2019-01-03 DIAGNOSIS — G4733 Obstructive sleep apnea (adult) (pediatric): Secondary | ICD-10-CM | POA: Diagnosis not present

## 2019-02-02 DIAGNOSIS — G4733 Obstructive sleep apnea (adult) (pediatric): Secondary | ICD-10-CM | POA: Diagnosis not present

## 2019-02-15 ENCOUNTER — Other Ambulatory Visit: Payer: Self-pay

## 2019-02-15 ENCOUNTER — Ambulatory Visit (INDEPENDENT_AMBULATORY_CARE_PROVIDER_SITE_OTHER): Payer: Medicare HMO | Admitting: Psychiatry

## 2019-02-15 DIAGNOSIS — F411 Generalized anxiety disorder: Secondary | ICD-10-CM | POA: Diagnosis not present

## 2019-02-15 DIAGNOSIS — F3131 Bipolar disorder, current episode depressed, mild: Secondary | ICD-10-CM

## 2019-02-15 MED ORDER — TRAZODONE HCL 300 MG PO TABS: 300.0000 mg | ORAL_TABLET | Freq: Every evening | ORAL | 2 refills | Status: DC | PRN

## 2019-02-15 MED ORDER — BUPROPION HCL ER (XL) 150 MG PO TB24: 150.0000 mg | ORAL_TABLET | ORAL | 0 refills | Status: DC

## 2019-02-15 MED ORDER — ZONISAMIDE 100 MG PO CAPS: 100.0000 mg | ORAL_CAPSULE | Freq: Every day | ORAL | 2 refills | Status: DC

## 2019-02-15 NOTE — Progress Notes (Signed)
BH MD/PA/NP OP Progress Note  02/15/2019 2:38 PM Mykeisha Cranmer  MRN:  [id number] Interview was conducted by phone and I verified that I was speaking with the correct person using two identifiers. I discussed the limitations of evaluation and management by telemedicine and  the availability of in person appointments. Patient expressed understanding and agreed to proceed.  Chief Complaint: Some depression.  HPI: 53 yo single female with bipolar 1 disorder, most recent episode depressed,borderline personality disorder; hx of cannabis use disorderand alcohol use disorder in sustained remission.She has stopped seeing providers at [facility] and came here instead. She has been off psychotropic meds for over a year. Medical problems related to back pain, IBS. She is trying to come off MS Contin, stopped Somaand is now on Zanaflex. Several medciation trials in the past - does not recall all meds she had been on. Very concerned about potential weight gain on meds (gained weight on Seroquel and Depakote).At her initial visit we decided to try a combination of lurasidone 20 mg and oxacarbazepine 150 mg bid - patient wanted to start on lowest doses possible.She developed hyponatremia and dizziness and it was discontinued. Zonisamide 100 mg at HS was started instead and hyponatremia and dizziness resolved. She reports that she felt depressed in April;  mood also improved until September when she began feeling more depressed, tearful. WE have increased dose of Latuda to 40 mg but she started to experience muscle twitching and eventually stopped taking it about a month ago. Sleep is adequate after we addedtrazodone(300 mg); she does not need it every night.  Visit Diagnosis:    ICD-10-CM   1. Bipolar 1 disorder, depressed, mild ([place])  F31.31   2. GAD (generalized anxiety disorder)  F41.1     Past Psychiatric History: Please see intake H&P.  Past Medical History:  Past Medical History:  Diagnosis Date   . Anxiety   . Arthritis    neck, knees, shoulders  . Asthma   . Bipolar disorder ([place])    currently feeling MANIC- 10/02/2016  . Depression   . GERD (gastroesophageal reflux disease)   . History of blood transfusion    as a newborn   . History of lump of left breast   . Hyperlipidemia   . Lumbar pseudoarthrosis   . Motion sickness    cars  . OSA (obstructive sleep apnea) 01/09/2016   can't afford CPAP  . Personality disorder ([place])   . PONV (postoperative nausea and vomiting)   . Post traumatic stress disorder (PTSD)   . Substance abuse ([place])   . Synovial cyst     Past Surgical History:  Procedure Laterality Date  . COLONOSCOPY WITH PROPOFOL N/A 11/04/2017   Procedure: COLONOSCOPY WITH PROPOFOL;  Surgeon: Lin Landsman, MD;  Location: [place];  Service: Endoscopy;  Laterality: N/A;  . ESOPHAGOGASTRODUODENOSCOPY (EGD) WITH PROPOFOL N/A 11/04/2017   Procedure: ESOPHAGOGASTRODUODENOSCOPY (EGD) WITH PROPOFOL with biopsies;  Surgeon: Lin Landsman, MD;  Location: [place];  Service: Endoscopy;  Laterality: N/A;  sleep apnea  . KNEE SURGERY Left    x5, post basketball injury  . LUMBAR LAMINECTOMY/DECOMPRESSION MICRODISCECTOMY Left 01/22/2016   Procedure: Laminectomy for facet/synovial cyst - left - Lumbar four - lumbar five;  Surgeon: Earnie Larsson, MD;  Location: [place];  Service: Neurosurgery;  Laterality: Left;  Laminectomy for facet/synovial cyst - left - Lumbar four - lumbar five  . POLYPECTOMY N/A 11/04/2017   Procedure: POLYPECTOMY INTESTINAL;  Surgeon: Lin Landsman, MD;  Location: [place];  Service: Endoscopy;  Laterality: N/A;  . SHOULDER SURGERY Left    x2  . TOE SURGERY Bilateral    bone spurs    Family Psychiatric History: Reviewed.  Family History:  Family History  Problem Relation Age of Onset  . Heart disease Father   . Hyperlipidemia Father   . Alcohol abuse Brother   . Alcohol abuse Paternal Uncle   . Breast cancer  Maternal Aunt        70's  . Colon cancer Neg Hx     Social History:  Social History   Socioeconomic History  . Marital status: Single    Spouse name: Not on file  . Number of children: 0  . Years of education: Not on file  . Highest education level: Bachelor's degree (e.g., BA, AB, BS)  Occupational History  . Not on file  Tobacco Use  . Smoking status: Former Smoker    Packs/day: 1.00    Years: 1.00    Pack years: 1.00    Types: Cigarettes    Quit date: 04/15/1996    Years since quitting: 22.8  . Smokeless tobacco: Never Used  . Tobacco comment: started back again in 2016, smoked for about 6 mo and then quit  Substance and Sexual Activity  . Alcohol use: No    Alcohol/week: 0.0 standard drinks    Comment: quit 20 years  . Drug use: Yes    Frequency: 14.0 times per week    Types: Marijuana    Comment: 20 yrs. ago- cocaine   . Sexual activity: Not Currently  Other Topics Concern  . Not on file  Social History Narrative  . Not on file   Social Determinants of Health   Financial Resource Strain:   . Difficulty of Paying Living Expenses: Not on file  Food Insecurity: Food Insecurity Present  . Worried About Charity fundraiser in the Last Year: Sometimes true  . Ran Out of Food in the Last Year: Not on file  Transportation Needs: No Transportation Needs  . Lack of Transportation (Medical): No  . Lack of Transportation (Non-Medical): No  Physical Activity: Inactive  . Days of Exercise per Week: 0 days  . Minutes of Exercise per Session: 0 min  Stress: Stress Concern Present  . Feeling of Stress : Very much  Social Connections: Unknown  . Frequency of Communication with Friends and Family: Not on file  . Frequency of Social Gatherings with Friends and Family: Not on file  . Attends Religious Services: 1 to 4 times per year  . Active Member of Clubs or Organizations: Not on file  . Attends Archivist Meetings: Not on file  . Marital Status: Never  married    Allergies:  Allergies  Allergen Reactions  . Effexor [Venlafaxine] Other (See Comments)    UNSPECIFIED REACTION, headaches, felt funny, withdrawal with missed dose   . Lamictal [Lamotrigine] Rash    Metabolic Disorder Labs: Lab Results  Component Value Date   HGBA1C 5.6 06/22/2018   No results found for: PROLACTIN Lab Results  Component Value Date   CHOL 165 06/22/2018   TRIG 111.0 06/22/2018   HDL 63.90 06/22/2018   CHOLHDL 3 06/22/2018   VLDL 22.2 06/22/2018   LDLCALC 79 06/22/2018   LDLCALC 75 05/21/2017   Lab Results  Component Value Date   TSH 1.30 03/15/2015    Therapeutic Level Labs: No results found for: LITHIUM No results found for: VALPROATE No  components found for:  CBMZ  Current Medications: Current Outpatient Medications  Medication Sig Dispense Refill  . albuterol (VENTOLIN HFA) 108 (90 Base) MCG/ACT inhaler TAKE 2 PUFFS BY MOUTH EVERY 6 HOURS AS NEEDED FOR WHEEZE OR SHORTNESS OF BREATH 18 g 1  . aspirin 81 MG tablet Take 81 mg by mouth daily.    Marland Kitchen buPROPion (WELLBUTRIN XL) 150 MG 24 hr tablet Take 1 tablet (150 mg total) by mouth every morning. 30 tablet 0  . diphenoxylate-atropine (LOMOTIL) 2.5-0.025 MG tablet Take 1 tablet by mouth daily as needed for diarrhea or loose stools. 30 tablet 0  . Fluticasone-Salmeterol (ADVAIR) 100-50 MCG/DOSE AEPB Inhale 1 puff into the lungs 2 (two) times daily. 1 each 3  . gabapentin (NEURONTIN) 300 MG capsule Take 1 capsule by mouth 3 (three) times daily.    Marland Kitchen morphine (MS CONTIN) 15 MG 12 hr tablet Take 1 tablet by mouth 3 (three) times daily.    . simvastatin (ZOCOR) 20 MG tablet TAKE 1 TABLET (20 MG TOTAL) BY MOUTH DAILY AT 6 PM. 90 tablet 0  . tiZANidine (ZANAFLEX) 4 MG capsule Take 4 mg by mouth 3 (three) times daily.    . trazodone (DESYREL) 300 MG tablet Take 1 tablet (300 mg total) by mouth at bedtime as needed for sleep. 30 tablet 2  . zonisamide (ZONEGRAN) 100 MG capsule Take 1 capsule (100 mg  total) by mouth at bedtime. 30 capsule 2   No current facility-administered medications for this visit.      Psychiatric Specialty Exam: Review of Systems  Musculoskeletal: Positive for back pain.  Psychiatric/Behavioral: Positive for sleep disturbance.  All other systems reviewed and are negative.   There were no vitals taken for this visit.There is no height or weight on file to calculate BMI.  General Appearance: NA  Eye Contact:  NA  Speech:  Clear and Coherent and Normal Rate  Volume:  Normal  Mood:  Some depression/anxiety.  Affect:  NA  Thought Process:  Goal Directed and Linear  Orientation:  Full (Time, Place, and Person)  Thought Content: Logical   Suicidal Thoughts:  No  Homicidal Thoughts:  No  Memory:  Immediate;   Good Recent;   Good Remote;   Good  Judgement:  Good  Insight:  Fair  Psychomotor Activity:  NA  Concentration:  Concentration: Good  Recall:  Good  Fund of Knowledge: Good  Language: Good  Akathisia:  Negative  Handed:  Right  AIMS (if indicated): not done  Assets:  Communication Skills Desire for Improvement Financial Resources/Insurance Housing Resilience  ADL's:  Intact  Cognition: WNL  Sleep:  Fair   Screenings: PHQ2-9     Office Visit from 06/22/2018 in [place] at [facility] Visit from 05/21/2017 in [place] at [facility] Visit from 09/12/2015 in [place] at [facility] Visit from 09/12/2014 in [place] at [facility]  PHQ-2 Total Score  0  0  0  1       Assessment and Plan: 53 yo single female with bipolar 1 disorder, most recent episode depressed,borderline personality disorder; hx of cannabis use disorderand alcohol use disorder in sustained remission.She has stopped seeing providers at [facility] and came here instead. She has been off psychotropic meds for over a year. Medical problems related to back pain, IBS. She is trying to come off MS Contin, stopped  Somaand is now on Zanaflex. Several medciation trials in the past - does not recall all  meds she had been on. Very concerned about potential weight gain on meds (gained weight on Seroquel and Depakote).At her initial visit we decided to try a combination of lurasidone 20 mg and oxacarbazepine 150 mg bid - patient wanted to start on lowest doses possible.She developed hyponatremia and dizziness and it was discontinued. Zonisamide 100 mg at HS was started instead and hyponatremia and dizziness resolved. She reports that she felt depressed in April;  mood also improved until September when she began feeling more depressed, tearful. WE have increased dose of Latuda to 40 mg but she started to experience muscle twitching and eventually stopped taking it about a month ago. Sleep is adequate after we addedtrazodone(300 mg); she does not need it every night.  Dx; Bipolar 1 disorder depressed mild, GAD  Plan: We continue trazodone and zonisamide unchanged. Everlee had been on Wellbutrin previously for many years with good response (off it for at least 5 years). We will try it again 150 mg of XL to begin with. She will return to clinic in one month at which time we will decide if we should increase its dose. The plan was discussed with patient who had an opportunity to ask questions and these were all answered. I spend 25 minutes in phone consultation with the patient.    Stephanie Acre, MD 02/15/2019, 2:38 PM

## 2019-03-05 DIAGNOSIS — G4733 Obstructive sleep apnea (adult) (pediatric): Secondary | ICD-10-CM | POA: Diagnosis not present

## 2019-03-08 ENCOUNTER — Other Ambulatory Visit: Payer: Self-pay | Admitting: Internal Medicine

## 2019-03-15 ENCOUNTER — Ambulatory Visit (INDEPENDENT_AMBULATORY_CARE_PROVIDER_SITE_OTHER)
Admission: RE | Admit: 2019-03-15 | Discharge: 2019-03-15 | Disposition: A | Discharge status: Home / Self Care | Payer: Medicare HMO | Source: Ambulatory Visit | Attending: Internal Medicine | Admitting: Internal Medicine

## 2019-03-15 ENCOUNTER — Encounter: Payer: Self-pay | Admitting: Internal Medicine

## 2019-03-15 ENCOUNTER — Ambulatory Visit (INDEPENDENT_AMBULATORY_CARE_PROVIDER_SITE_OTHER): Payer: Medicare HMO | Admitting: Internal Medicine

## 2019-03-15 ENCOUNTER — Other Ambulatory Visit: Payer: Self-pay

## 2019-03-15 DIAGNOSIS — R053 Chronic cough: Secondary | ICD-10-CM

## 2019-03-15 DIAGNOSIS — R0981 Nasal congestion: Secondary | ICD-10-CM

## 2019-03-15 DIAGNOSIS — R05 Cough: Secondary | ICD-10-CM

## 2019-03-15 DIAGNOSIS — J4540 Moderate persistent asthma, uncomplicated: Secondary | ICD-10-CM

## 2019-03-15 DIAGNOSIS — R062 Wheezing: Secondary | ICD-10-CM | POA: Diagnosis not present

## 2019-03-15 NOTE — Patient Instructions (Signed)

## 2019-03-15 NOTE — Progress Notes (Signed)
Virtual Visit via Video Note  I connected with [name] on 03/15/19 at 10:00 AM EST by a video enabled telemedicine application and verified that I am speaking with the correct person using two identifiers.  Location: Patient: Home Provider: Office   I discussed the limitations of evaluation and management by telemedicine and the availability of in person appointments. The patient expressed understanding and agreed to proceed.  History of Present Illness:  Pt reports chronic nasal congestion, cough and wheezing. This has been an ongoing issue for months, but she feels like it is getting worse in the last month. She is not currently blowing anything out of her nose. The cough is productive of clear mucous with black specks in it. The cough is worse at night. When she cough, she wheezes. She denies headache, runny nose, ear pain, sore throat, loss of taste/smell or SOB. She denies fever, chills or body aches. She does smoke THC occasionally but does not smoke cigarettes. She has tried Mucinex, Secondary school teacher, Albuterol and Flonase with minimal relief. She has not had sick contacts or exposure to COVID that she is aware of.  Past Medical History:  Diagnosis Date  . Anxiety   . Arthritis    neck, knees, shoulders  . Asthma   . Bipolar disorder ([place])    currently feeling MANIC- 10/02/2016  . Depression   . GERD (gastroesophageal reflux disease)   . History of blood transfusion    as a newborn   . History of lump of left breast   . Hyperlipidemia   . Lumbar pseudoarthrosis   . Motion sickness    cars  . OSA (obstructive sleep apnea) 01/09/2016   can't afford CPAP  . Personality disorder ([place])   . PONV (postoperative nausea and vomiting)   . Post traumatic stress disorder (PTSD)   . Substance abuse ([place])   . Synovial cyst     Current Outpatient Medications  Medication Sig Dispense Refill  . albuterol (VENTOLIN HFA) 108 (90 Base) MCG/ACT inhaler TAKE 2 PUFFS BY MOUTH EVERY 6 HOURS AS  NEEDED FOR WHEEZE OR SHORTNESS OF BREATH 18 g 1  . aspirin 81 MG tablet Take 81 mg by mouth daily.    Marland Kitchen buPROPion (WELLBUTRIN XL) 150 MG 24 hr tablet Take 1 tablet (150 mg total) by mouth every morning. 30 tablet 0  . diphenoxylate-atropine (LOMOTIL) 2.5-0.025 MG tablet Take 1 tablet by mouth daily as needed for diarrhea or loose stools. 30 tablet 0  . Fluticasone-Salmeterol (ADVAIR) 100-50 MCG/DOSE AEPB Inhale 1 puff into the lungs 2 (two) times daily. 1 each 3  . gabapentin (NEURONTIN) 300 MG capsule Take 1 capsule by mouth 3 (three) times daily.    Marland Kitchen morphine (MS CONTIN) 15 MG 12 hr tablet Take 1 tablet by mouth 3 (three) times daily.    . simvastatin (ZOCOR) 20 MG tablet Take 1 tablet (20 mg total) by mouth daily at 6 PM. SCHEDULE PHYSICAL 90 tablet 0  . tiZANidine (ZANAFLEX) 4 MG capsule Take 4 mg by mouth 3 (three) times daily.    . trazodone (DESYREL) 300 MG tablet Take 1 tablet (300 mg total) by mouth at bedtime as needed for sleep. 30 tablet 2  . zonisamide (ZONEGRAN) 100 MG capsule Take 1 capsule (100 mg total) by mouth at bedtime. 30 capsule 2   No current facility-administered medications for this visit.    Allergies  Allergen Reactions  . Effexor [Venlafaxine] Other (See Comments)    UNSPECIFIED REACTION, headaches,  felt funny, withdrawal with missed dose   . Lamictal [Lamotrigine] Rash    Family History  Problem Relation Age of Onset  . Heart disease Father   . Hyperlipidemia Father   . Alcohol abuse Brother   . Alcohol abuse Paternal Uncle   . Breast cancer Maternal Aunt        70's  . Colon cancer Neg Hx     Social History   Socioeconomic History  . Marital status: Single    Spouse name: Not on file  . Number of children: 0  . Years of education: Not on file  . Highest education level: Bachelor's degree (e.g., BA, AB, BS)  Occupational History  . Not on file  Tobacco Use  . Smoking status: Former Smoker    Packs/day: 1.00    Years: 1.00    Pack years:  1.00    Types: Cigarettes    Quit date: 04/15/1996    Years since quitting: 22.9  . Smokeless tobacco: Never Used  . Tobacco comment: started back again in 2016, smoked for about 6 mo and then quit  Substance and Sexual Activity  . Alcohol use: No    Alcohol/week: 0.0 standard drinks    Comment: quit 20 years  . Drug use: Yes    Frequency: 14.0 times per week    Types: Marijuana    Comment: 20 yrs. ago- cocaine   . Sexual activity: Not Currently  Other Topics Concern  . Not on file  Social History Narrative  . Not on file   Social Determinants of Health   Financial Resource Strain:   . Difficulty of Paying Living Expenses: Not on file  Food Insecurity: Food Insecurity Present  . Worried About Charity fundraiser in the Last Year: Sometimes true  . Ran Out of Food in the Last Year: Not on file  Transportation Needs: No Transportation Needs  . Lack of Transportation (Medical): No  . Lack of Transportation (Non-Medical): No  Physical Activity: Inactive  . Days of Exercise per Week: 0 days  . Minutes of Exercise per Session: 0 min  Stress: Stress Concern Present  . Feeling of Stress : Very much  Social Connections: Unknown  . Frequency of Communication with Friends and Family: Not on file  . Frequency of Social Gatherings with Friends and Family: Not on file  . Attends Religious Services: 1 to 4 times per year  . Active Member of Clubs or Organizations: Not on file  . Attends Archivist Meetings: Not on file  . Marital Status: Never married  Intimate Partner Violence:   . Fear of Current or Ex-Partner: Not on file  . Emotionally Abused: Not on file  . Physically Abused: Not on file  . Sexually Abused: Not on file     Constitutional: Denies fever, malaise, fatigue, headache or abrupt weight changes.  HEENT: Pt reports nasal congestion. Denies eye pain, eye redness, ear pain, ringing in the ears, wax buildup, runny nose, bloody nose, or sore throat. Respiratory:  Pt reports cough and wheezing. Denies difficulty breathing, shortness of breath.   Cardiovascular: Denies chest pain, chest tightness, palpitations or swelling in the hands or feet.   No other specific complaints in a complete review of systems (except as listed in HPI above).  Observations/Objective:   Wt Readings from Last 3 Encounters:  12/10/18 137 lb (62.1 kg)  11/22/18 134 lb (60.8 kg)  04/15/18 135 lb 6.4 oz (61.4 kg)    General: Appears  her stated age, well developed, well nourished in NAD. HEENT:  Nose: sounds congested ; Throat/Mouth: no hoarseness noted; Pulmonary/Chest: Normal effort. No respiratory distress.  Neurological: Alert and oriented.   BMET    Component Value Date/Time   NA 135 07/07/2018 0933   K 4.1 07/07/2018 0933   CL 103 07/07/2018 0933   CO2 24 07/07/2018 0933   GLUCOSE 91 07/07/2018 0933   BUN 16 07/07/2018 0933   CREATININE 0.78 07/07/2018 0933   CALCIUM 9.1 07/07/2018 0933   GFRNONAA >60 10/28/2016 0659   GFRAA >60 10/28/2016 0659    Lipid Panel     Component Value Date/Time   CHOL 165 06/22/2018 1437   TRIG 111.0 06/22/2018 1437   HDL 63.90 06/22/2018 1437   CHOLHDL 3 06/22/2018 1437   VLDL 22.2 06/22/2018 1437   LDLCALC 79 06/22/2018 1437    CBC    Component Value Date/Time   WBC 6.2 06/22/2018 1437   RBC 3.96 06/22/2018 1437   HGB 12.8 06/22/2018 1437   HCT 36.6 06/22/2018 1437   PLT 338.0 06/22/2018 1437   MCV 92.3 06/22/2018 1437   MCH 30.9 10/28/2016 0659   MCHC 34.9 06/22/2018 1437   RDW 13.9 06/22/2018 1437   LYMPHSABS 2.1 10/02/2016 0945   MONOABS 0.4 10/02/2016 0945   EOSABS 0.2 10/02/2016 0945   BASOSABS 0.0 10/02/2016 0945    Hgb A1C Lab Results  Component Value Date   HGBA1C 5.6 06/22/2018       Assessment and Plan:  Nasal Congestion, Chronic Cough, Wheezing, Asthma:  Will obtain chest xray Continue Wixela and Albuterol Consider Pred Taper pending xray ? R/t uncontrolled allergies Start Flonase  daily Consider referral for allergy testing if symptoms persist or worsen No indication for antibiotics or COVID testing at this time  Return precautions discussed   Follow Up Instructions:    I discussed the assessment and treatment plan with the patient. The patient was provided an opportunity to ask questions and all were answered. The patient agreed with the plan and demonstrated an understanding of the instructions.   The patient was advised to call back or seek an in-person evaluation if the symptoms worsen or if the condition fails to improve as anticipated.   Webb Silversmith, NP

## 2019-03-17 ENCOUNTER — Other Ambulatory Visit: Payer: Self-pay | Admitting: Internal Medicine

## 2019-03-17 DIAGNOSIS — R0981 Nasal congestion: Secondary | ICD-10-CM

## 2019-03-17 DIAGNOSIS — J4520 Mild intermittent asthma, uncomplicated: Secondary | ICD-10-CM

## 2019-03-17 DIAGNOSIS — R062 Wheezing: Secondary | ICD-10-CM

## 2019-03-17 DIAGNOSIS — R059 Cough, unspecified: Secondary | ICD-10-CM

## 2019-03-17 DIAGNOSIS — R05 Cough: Secondary | ICD-10-CM

## 2019-03-18 ENCOUNTER — Ambulatory Visit (INDEPENDENT_AMBULATORY_CARE_PROVIDER_SITE_OTHER): Payer: Medicare HMO | Admitting: Psychiatry

## 2019-03-18 ENCOUNTER — Other Ambulatory Visit: Payer: Self-pay

## 2019-03-18 ENCOUNTER — Other Ambulatory Visit (HOSPITAL_COMMUNITY): Payer: Self-pay | Admitting: Psychiatry

## 2019-03-18 DIAGNOSIS — F411 Generalized anxiety disorder: Secondary | ICD-10-CM | POA: Diagnosis not present

## 2019-03-18 DIAGNOSIS — F3131 Bipolar disorder, current episode depressed, mild: Secondary | ICD-10-CM | POA: Diagnosis not present

## 2019-03-18 MED ORDER — BUPROPION HCL ER (XL) 150 MG PO TB24: 150.0000 mg | ORAL_TABLET | ORAL | 0 refills | Status: DC

## 2019-03-18 MED ORDER — ZONISAMIDE 100 MG PO CAPS: 100.0000 mg | ORAL_CAPSULE | Freq: Every day | ORAL | 0 refills | Status: DC

## 2019-03-18 NOTE — Progress Notes (Signed)
[facility] MD/PA/NP OP Progress Note  03/18/2019 10:10 AM [name]  MRN:  [id number] Interview was conducted by phone and I verified that I was speaking with the correct person using two identifiers. I discussed the limitations of evaluation and management by telemedicine and  the availability of in person appointments. Patient expressed understanding and agreed to proceed.  Chief Complaint: "I feel better".  HPI: 54 yo single female withbipolar 1 disorder, most recent episode depressed,borderline personality disorder;hx of cannabis use disorderandalcohol use disorder in sustained remission.She is disabled.  Several medciation trials in the past - does not recall all meds she had been on. Very concerned about potential weight gain on meds (gained weight on Seroquel and Depakote).At her initial visit we decided to try a combination of lurasidone 20 mg and oxacarbazepine 150 mg bid - patient wanted to start on lowest doses possible.She developed hyponatremia and dizziness and it was discontinued. Zonisamide 100 mg at HS was started insteadand hyponatremia and dizziness resolved. She reports that she felt depressed in April;mood also improved until September when she began feeling more depressed, tearful.We have increased dose of Latuda to 40 mg but she started to experience muscle twitching and eventually stopped taking it about a month ago. [name] had been on Wellbutrin previously for many years with good response (off it for at least 5 years). We restarted 150 mg of XL to begin with and she reports that her mood improved. Sleep is adequate after we addedtrazodone(300 mg); she does not need it every night.  Visit Diagnosis:    ICD-10-CM   1. Bipolar 1 disorder, depressed, mild ([place])  F31.31   2. GAD (generalized anxiety disorder)  F41.1     Past Psychiatric History: Please see intake H&P.  Past Medical History:  Past Medical History:  Diagnosis Date  . Anxiety   . Arthritis     neck, knees, shoulders  . Asthma   . Bipolar disorder ([place])    currently feeling MANIC- 10/02/2016  . Depression   . GERD (gastroesophageal reflux disease)   . History of blood transfusion    as a newborn   . History of lump of left breast   . Hyperlipidemia   . Lumbar pseudoarthrosis   . Motion sickness    cars  . OSA (obstructive sleep apnea) 01/09/2016   can't afford CPAP  . Personality disorder ([place])   . PONV (postoperative nausea and vomiting)   . Post traumatic stress disorder (PTSD)   . Substance abuse ([place])   . Synovial cyst     Past Surgical History:  Procedure Laterality Date  . COLONOSCOPY WITH PROPOFOL N/A 11/04/2017   Procedure: COLONOSCOPY WITH PROPOFOL;  Surgeon: Lin Landsman, MD;  Location: [facility];  Service: Endoscopy;  Laterality: N/A;  . ESOPHAGOGASTRODUODENOSCOPY (EGD) WITH PROPOFOL N/A 11/04/2017   Procedure: ESOPHAGOGASTRODUODENOSCOPY (EGD) WITH PROPOFOL with biopsies;  Surgeon: Lin Landsman, MD;  Location: [facility];  Service: Endoscopy;  Laterality: N/A;  sleep apnea  . KNEE SURGERY Left    x5, post basketball injury  . LUMBAR LAMINECTOMY/DECOMPRESSION MICRODISCECTOMY Left 01/22/2016   Procedure: Laminectomy for facet/synovial cyst - left - Lumbar four - lumbar five;  Surgeon: Earnie Larsson, MD;  Location: [facility];  Service: Neurosurgery;  Laterality: Left;  Laminectomy for facet/synovial cyst - left - Lumbar four - lumbar five  . POLYPECTOMY N/A 11/04/2017   Procedure: POLYPECTOMY INTESTINAL;  Surgeon: Lin Landsman, MD;  Location: [facility];  Service: Endoscopy;  Laterality: N/A;  . SHOULDER SURGERY Left    x2  . TOE SURGERY Bilateral    bone spurs    Family Psychiatric History: Reviewed.  Family History:  Family History  Problem Relation Age of Onset  . Heart disease Father   . Hyperlipidemia Father   . Alcohol abuse Brother   . Alcohol abuse Paternal Uncle   . Breast cancer Maternal Aunt        70's  .  Colon cancer Neg Hx     Social History:  Social History   Socioeconomic History  . Marital status: Single    Spouse name: Not on file  . Number of children: 0  . Years of education: Not on file  . Highest education level: Bachelor's degree (e.g., BA, AB, BS)  Occupational History  . Not on file  Tobacco Use  . Smoking status: Former Smoker    Packs/day: 1.00    Years: 1.00    Pack years: 1.00    Types: Cigarettes    Quit date: 04/15/1996    Years since quitting: 22.9  . Smokeless tobacco: Never Used  . Tobacco comment: started back again in 2016, smoked for about 6 mo and then quit  Substance and Sexual Activity  . Alcohol use: No    Alcohol/week: 0.0 standard drinks    Comment: quit 20 years  . Drug use: Yes    Frequency: 14.0 times per week    Types: Marijuana    Comment: 20 yrs. ago- cocaine   . Sexual activity: Not Currently  Other Topics Concern  . Not on file  Social History Narrative  . Not on file   Social Determinants of Health   Financial Resource Strain:   . Difficulty of Paying Living Expenses: Not on file  Food Insecurity: Food Insecurity Present  . Worried About Charity fundraiser in the Last Year: Sometimes true  . Ran Out of Food in the Last Year: Not on file  Transportation Needs: No Transportation Needs  . Lack of Transportation (Medical): No  . Lack of Transportation (Non-Medical): No  Physical Activity: Inactive  . Days of Exercise per Week: 0 days  . Minutes of Exercise per Session: 0 min  Stress: Stress Concern Present  . Feeling of Stress : Very much  Social Connections: Unknown  . Frequency of Communication with Friends and Family: Not on file  . Frequency of Social Gatherings with Friends and Family: Not on file  . Attends Religious Services: 1 to 4 times per year  . Active Member of Clubs or Organizations: Not on file  . Attends Archivist Meetings: Not on file  . Marital Status: Never married    Allergies:  Allergies   Allergen Reactions  . Effexor [Venlafaxine] Other (See Comments)    UNSPECIFIED REACTION, headaches, felt funny, withdrawal with missed dose   . Lamictal [Lamotrigine] Rash    Metabolic Disorder Labs: Lab Results  Component Value Date   HGBA1C 5.6 06/22/2018   No results found for: PROLACTIN Lab Results  Component Value Date   CHOL 165 06/22/2018   TRIG 111.0 06/22/2018   HDL 63.90 06/22/2018   CHOLHDL 3 06/22/2018   VLDL 22.2 06/22/2018   LDLCALC 79 06/22/2018   LDLCALC 75 05/21/2017   Lab Results  Component Value Date   TSH 1.30 03/15/2015    Therapeutic Level Labs: No results found for: LITHIUM No results found for: VALPROATE No components found for:  CBMZ  Current Medications:  Current Outpatient Medications  Medication Sig Dispense Refill  . zonisamide (ZONEGRAN) 100 MG capsule Take 1 capsule (100 mg total) by mouth at bedtime. 90 capsule 0  . albuterol (VENTOLIN HFA) 108 (90 Base) MCG/ACT inhaler TAKE 2 PUFFS BY MOUTH EVERY 6 HOURS AS NEEDED FOR WHEEZE OR SHORTNESS OF BREATH 18 g 1  . aspirin 81 MG tablet Take 81 mg by mouth daily.    Marland Kitchen buPROPion (WELLBUTRIN XL) 150 MG 24 hr tablet Take 1 tablet (150 mg total) by mouth every morning. 90 tablet 0  . diphenoxylate-atropine (LOMOTIL) 2.5-0.025 MG tablet Take 1 tablet by mouth daily as needed for diarrhea or loose stools. (Patient not taking: Reported on 03/15/2019) 30 tablet 0  . gabapentin (NEURONTIN) 300 MG capsule Take 1 capsule by mouth 3 (three) times daily.    Marland Kitchen morphine (MS CONTIN) 15 MG 12 hr tablet Take 1 tablet by mouth 3 (three) times daily.    . simvastatin (ZOCOR) 20 MG tablet Take 1 tablet (20 mg total) by mouth daily at 6 PM. SCHEDULE PHYSICAL 90 tablet 0  . tiZANidine (ZANAFLEX) 4 MG capsule Take 4 mg by mouth 3 (three) times daily.    . trazodone (DESYREL) 300 MG tablet Take 1 tablet (300 mg total) by mouth at bedtime as needed for sleep. 30 tablet 2   No current facility-administered medications for  this visit.     Psychiatric Specialty Exam: Review of Systems  Musculoskeletal: Positive for back pain.  All other systems reviewed and are negative.   There were no vitals taken for this visit.There is no height or weight on file to calculate BMI.  General Appearance: NA  Eye Contact:  NA  Speech:  Clear and Coherent and Normal Rate  Volume:  Normal  Mood:  Practically euthymic.  Affect:  NA  Thought Process:  Goal Directed and Linear  Orientation:  Full (Time, Place, and Person)  Thought Content: Logical   Suicidal Thoughts:  No  Homicidal Thoughts:  No  Memory:  Immediate;   Good Recent;   Good Remote;   Good  Judgement:  Good  Insight:  Fair  Psychomotor Activity:  NA  Concentration:  Concentration: Good  Recall:  Good  Fund of Knowledge: Good  Language: Good  Akathisia:  Negative  Handed:  Right  AIMS (if indicated): not done  Assets:  Communication Skills Desire for Improvement Financial Resources/Insurance Housing  ADL's:  Intact  Cognition: WNL  Sleep:  Fair   Screenings: PHQ2-9     Office Visit from 06/22/2018 in [place] at [facility] Visit from 05/21/2017 in [place] at [facility] Visit from 09/12/2015 in [place] at [facility] Visit from 09/12/2014 in [place] at [facility]  PHQ-2 Total Score  0  0  0  1       Assessment and Plan: 54 yo single female withbipolar 1 disorder, most recent episode depressed,borderline personality disorder;hx of cannabis use disorderandalcohol use disorder in sustained remission.She is disabled.  Several medciation trials in the past - does not recall all meds she had been on. Very concerned about potential weight gain on meds (gained weight on Seroquel and Depakote).At her initial visit we decided to try a combination of lurasidone 20 mg and oxacarbazepine 150 mg bid - patient wanted to start on lowest doses possible.She developed hyponatremia and  dizziness and it was discontinued. Zonisamide 100 mg at HS was started insteadand hyponatremia and dizziness resolved. She reports that  she felt depressed in April;mood also improved until September when she began feeling more depressed, tearful.We have increased dose of Latuda to 40 mg but she started to experience muscle twitching and eventually stopped taking it about a month ago. [name] had been on Wellbutrin previously for many years with good response (off it for at least 5 years). We restarted 150 mg of XL to begin with and she reports that her mood improved. Sleep is adequate after we addedtrazodone(300 mg); she does not need it every night.  Dx; Bipolar 1 disorder depressed mild, GAD  Plan:We continue bupropion, trazodone and zonisamide unchanged.She will return to clinic in three months. The plan was discussed with patient who had an opportunity to ask questions and these were all answered. I spend25 minutes inphone consultation with the patient.    Stephanie Acre, MD 03/18/2019, 10:10 AM

## 2019-04-05 ENCOUNTER — Telehealth: Payer: Self-pay

## 2019-04-05 ENCOUNTER — Ambulatory Visit (INDEPENDENT_AMBULATORY_CARE_PROVIDER_SITE_OTHER): Payer: Medicare HMO | Admitting: Family Medicine

## 2019-04-05 VITALS — BP 180/110 | HR 80 | Temp 97.7°F | Ht 61.0 in | Wt 136.0 lb

## 2019-04-05 DIAGNOSIS — J40 Bronchitis, not specified as acute or chronic: Secondary | ICD-10-CM | POA: Diagnosis not present

## 2019-04-05 DIAGNOSIS — R030 Elevated blood-pressure reading, without diagnosis of hypertension: Secondary | ICD-10-CM | POA: Diagnosis not present

## 2019-04-05 DIAGNOSIS — J0141 Acute recurrent pansinusitis: Secondary | ICD-10-CM

## 2019-04-05 DIAGNOSIS — G4733 Obstructive sleep apnea (adult) (pediatric): Secondary | ICD-10-CM | POA: Diagnosis not present

## 2019-04-05 MED ORDER — PREDNISONE 20 MG PO TABS: ORAL_TABLET | ORAL | 0 refills | Status: DC

## 2019-04-05 MED ORDER — AMOXICILLIN-POT CLAVULANATE 875-125 MG PO TABS: 1.0000 | ORAL_TABLET | Freq: Two times a day (BID) | ORAL | 0 refills | Status: DC

## 2019-04-05 MED ORDER — PROMETHAZINE-DM 6.25-15 MG/5ML PO SYRP: 5.0000 mL | ORAL_SOLUTION | Freq: Four times a day (QID) | ORAL | 0 refills | Status: DC | PRN

## 2019-04-05 NOTE — Telephone Encounter (Signed)
Pt c/o productive cough w/dark phlegm, runny/stuffy nose and wheezing for one year. Pt had virtual visit with PCP on 1/12 where same symptoms were reviewed. Pt requested to come into the office today. PCP requested pt to go to respiratory clinic. Scheduled pt for [facility]. Pt is also requesting a referral to ENT.She wants to cancel the referral to the [facility] a msg would be sent to PCP and this office would f/u with scheduling.

## 2019-04-05 NOTE — Telephone Encounter (Signed)
She needs to see the allergist, and then we can have her see ENT if needed

## 2019-04-05 NOTE — Progress Notes (Signed)
Patient ID: [name], female    DOB: Jul 07, 1965, 54 y.o.   MRN: [id number]  PCP: Jearld Fenton, NP  Chief Complaint  Patient presents with  . cough    Subjective:  HPI [name] is a 54 y.o. female presents to [facility] Respiratory clinic for evaluation of symptoms related to cough, sinus congestion, sinus pressure, and headache ongoing for at least 12 months.  Recently COVID-19 tested and results were negative. Patient is frustrated that she was referred to clinic for evaluation of a chronic problem, however, agreed to visit and evaluation. Reports she has been working with PCP with varying antihistamine treatments and nasal sprays to improve symptoms on-going sinus symptoms. She reached out to PCP to request a referral to ENT and was referred here for evaluation. She has been referred to Allergy specialist, however, doesn't feel she has a  problem with allergies. Current symptoms of congestion and facial pressure has worsened over the last few weeks. She is now coughing (non-productive) and feels her lungs are congested. She endorses itchy throat no overt throat pain. She has remained afebrile, negative for body aches, weakness, or loss of taste or smell. She has only attempted to manage symptoms with chronic medications. Denies any recent antibiotic or oral steriodal use.   Review of Systems Pertinent negatives listed in HPI  Patient Active Problem List   Diagnosis Date Noted  . Asthma exacerbation 11/22/2018  . GERD (gastroesophageal reflux disease) 06/22/2018  . Cannabis use disorder, moderate, dependence ([place]) 04/22/2018  . Insomnia 04/15/2018  . GAD (generalized anxiety disorder) 05/23/2017  . OSA (obstructive sleep apnea) 01/09/2016  . Asthma 11/21/2015  . Chronic pain syndrome 11/21/2015  . HLD (hyperlipidemia) 09/12/2014  . Bipolar 1 disorder, depressed, mild ([place]) 09/12/2014      Prior to Admission medications   Medication Sig Start Date End Date Taking?  Authorizing Provider  albuterol (VENTOLIN HFA) 108 (90 Base) MCG/ACT inhaler TAKE 2 PUFFS BY MOUTH EVERY 6 HOURS AS NEEDED FOR WHEEZE OR SHORTNESS OF BREATH 10/07/18  Yes Jearld Fenton, NP  aspirin 81 MG tablet Take 81 mg by mouth daily.   Yes [provider]  buPROPion (WELLBUTRIN XL) 150 MG 24 hr tablet Take 1 tablet (150 mg total) by mouth every morning. 03/18/19 06/16/19 Yes Pucilowski, Olgierd A, MD  diphenoxylate-atropine (LOMOTIL) 2.5-0.025 MG tablet Take 1 tablet by mouth daily as needed for diarrhea or loose stools. 02/09/18  Yes Vanga, Tally Due, MD  gabapentin (NEURONTIN) 300 MG capsule Take 1 capsule by mouth 3 (three) times daily. 02/25/17  Yes [provider]  morphine (MS CONTIN) 15 MG 12 hr tablet Take 1 tablet by mouth 2 (two) times daily.  03/12/17  Yes [provider]  simvastatin (ZOCOR) 20 MG tablet Take 1 tablet (20 mg total) by mouth daily at 6 PM. SCHEDULE PHYSICAL 03/08/19  Yes Baity, Coralie Keens, NP  tiZANidine (ZANAFLEX) 4 MG capsule Take 4 mg by mouth 3 (three) times daily.   Yes [provider]  trazodone (DESYREL) 300 MG tablet Take 1 tablet (300 mg total) by mouth at bedtime as needed for sleep. 02/15/19 05/16/19 Yes Pucilowski, Olgierd A, MD  zonisamide (ZONEGRAN) 100 MG capsule Take 1 capsule (100 mg total) by mouth at bedtime. 03/18/19 06/16/19 Yes Pucilowski, Marchia Bond, MD    Past Medical, Surgical Family and Social History reviewed and updated.    Objective:   Today's Vitals   04/05/19 1753  BP: (!) 180/110  Pulse:  80  Temp: 97.7 F (36.5 C)  SpO2: 99%  Weight: 136 lb (61.7 kg)  Height: 5\' 1"  (1.549 m)    Wt Readings from Last 3 Encounters:  04/05/19 136 lb (61.7 kg)  12/10/18 137 lb (62.1 kg)  11/22/18 134 lb (60.8 kg)    Physical Exam Constitutional:      Appearance: Normal appearance. She is ill-appearing.  HENT:     Nose: Congestion and rhinorrhea present.     Right Turbinates: Swollen.     Left Turbinates:  Swollen.     Right Sinus: Maxillary sinus tenderness and frontal sinus tenderness present.     Left Sinus: Maxillary sinus tenderness and frontal sinus tenderness present.  Cardiovascular:     Rate and Rhythm: Normal rate and regular rhythm.  Pulmonary:     Breath sounds: Decreased air movement present. Wheezing present.  Lymphadenopathy:     Cervical: No cervical adenopathy.  Skin:    General: Skin is warm and dry.  Psychiatric:        Attention and Perception: Attention normal.        Mood and Affect: Mood is anxious.     Assessment & Plan:  1. Acute recurrent pansinusitis 2. Bronchitis -Prednisone for management of sinus inflammation and bronchial inflammation. -Augmentin BID x 10 days -Promethazine -DM 5 mls every 4 hours PRN -Continue chronic medications -Follow-up with both asthma/allergist/ENT   3. BP Elevated without Diagnosis of Hypertension  BP elevated today, however, patient very anxious and frustrated. Encouraged to keep an eye on BP and check periodically at home.     Meds ordered this encounter  Medications  . predniSONE (DELTASONE) 20 MG tablet    Sig: Take 3 PO QAM x3days, 2 PO QAM x3days, 1 PO QAM x3days    Dispense:  18 tablet    Refill:  0  . amoxicillin-clavulanate (AUGMENTIN) 875-125 MG tablet    Sig: Take 1 tablet by mouth 2 (two) times daily.    Dispense:  20 tablet    Refill:  0  . promethazine-dextromethorphan (PROMETHAZINE-DM) 6.25-15 MG/5ML syrup    Sig: Take 5 mLs by mouth 4 (four) times daily as needed for cough.    Dispense:  120 mL    Refill:  0     -The patient was given clear instructions to go to ER or return to medical center if symptoms do not improve, worsen or new problems develop. The patient verbalized understanding.     Molli Barrows, FNP-C [facility], PRN Provider  [facility]. [place], [place] Clinic Phone: [phone] Clinic Fax: [phone] Clinic Hours: 5:30 pm -7:30 pm (Monday-Friday)

## 2019-04-05 NOTE — Patient Instructions (Signed)
Acute Bronchitis, Adult  Acute bronchitis is when air tubes in the lungs (bronchi) suddenly get swollen. The condition can make it hard for you to breathe. In adults, acute bronchitis usually goes away within 2 weeks. A cough caused by bronchitis may last up to 3 weeks. Smoking, allergies, and asthma can make the condition worse. What are the causes? This condition is caused by:  Cold and flu viruses. The most common cause of this condition is the virus that causes the common cold.  Bacteria.  Substances that irritate the lungs, including: ? Smoke from cigarettes and other types of tobacco. ? Dust and pollen. ? Fumes from chemicals, gases, or burned fuel. ? Other materials that pollute indoor or outdoor air.  Close contact with someone who has acute bronchitis. What increases the risk? The following factors may make you more likely to develop this condition:  A weak body's defense system. This is also called the immune system.  Any condition that affects your lungs and breathing, such as asthma. What are the signs or symptoms? Symptoms of this condition include:  A cough.  Coughing up clear, yellow, or green mucus.  Wheezing.  Chest congestion.  Shortness of breath.  A fever.  Body aches.  Chills.  A sore throat. How is this treated? Acute bronchitis may go away over time without treatment. Your doctor may recommend:  Drinking more fluids.  Taking a medicine for a fever or cough.  Using a device that gets medicine into your lungs (inhaler).  Using a vaporizer or a humidifier. These are machines that add water or moisture in the air to help with coughing and poor breathing. Follow these instructions at home:  Activity  Get a lot of rest.  Avoid places where there are fumes from chemicals.  Return to your normal activities as told by your doctor. Ask your doctor what activities are safe for you. Lifestyle  Drink enough fluids to keep your pee (urine)  pale yellow.  Do not drink alcohol.  Do not use any products that contain nicotine or tobacco, such as cigarettes, e-cigarettes, and chewing tobacco. If you need help quitting, ask your doctor. Be aware that: ? Your bronchitis will get worse if you smoke or breathe in other people's smoke (secondhand smoke). ? Your lungs will heal faster if you quit smoking. General instructions  Take over-the-counter and prescription medicines only as told by your doctor.  Use an inhaler, cool mist vaporizer, or humidifier as told by your doctor.  Rinse your mouth often with salt water. To make salt water, dissolve -1 tsp (3-6 g) of salt in 1 cup (237 mL) of warm water.  Keep all follow-up visits as told by your doctor. This is important. How is this prevented? To lower your risk of getting this condition again:  Wash your hands often with soap and water. If soap and water are not available, use hand sanitizer.  Avoid contact with people who have cold symptoms.  Try not to touch your mouth, nose, or eyes with your hands.  Make sure to get the flu shot every year. Contact a doctor if:  Your symptoms do not get better in 2 weeks.  You vomit more than once or twice.  You have symptoms of loss of fluid from your body (dehydration). These include: ? Dark urine. ? Dry skin or eyes. ? Increased thirst. ? Headaches. ? Confusion. ? Muscle cramps. Get help right away if:  You cough up blood.  You have  chest pain.  You have very bad shortness of breath.  You become dehydrated.  You faint or keep feeling like you are going to faint.  You keep vomiting.  You have a very bad headache.  Your fever or chills get worse. These symptoms may be an emergency. Do not wait to see if the symptoms will go away. Get medical help right away. Call your local emergency services (911 in the [place] Do not drive yourself to the hospital. Summary  Acute bronchitis is when air tubes in the lungs (bronchi)  suddenly get swollen. In adults, acute bronchitis usually goes away within 2 weeks.  Take over-the-counter and prescription medicines only as told by your doctor.  Drink enough fluid to keep your pee (urine) pale yellow.  Contact a doctor if your symptoms do not improve after 2 weeks of treatment.  Get help right away if you cough up blood, faint, or have chest pain or shortness of breath. This information is not intended to replace advice given to you by your health care provider. Make sure you discuss any questions you have with your health care provider. Document Revised: 09/10/2018 Document Reviewed: 09/10/2018 Elsevier Patient Education  [place].    Sinusitis, Adult Sinusitis is soreness and swelling (inflammation) of your sinuses. Sinuses are hollow spaces in the bones around your face. They are located:  Around your eyes.  In the middle of your forehead.  Behind your nose.  In your cheekbones. Your sinuses and nasal passages are lined with a fluid called mucus. Mucus drains out of your sinuses. Swelling can trap mucus in your sinuses. This lets germs (bacteria, virus, or fungus) grow, which leads to infection. Most of the time, this condition is caused by a virus. What are the causes? This condition is caused by:  Allergies.  Asthma.  Germs.  Things that block your nose or sinuses.  Growths in the nose (nasal polyps).  Chemicals or irritants in the air.  Fungus (rare). What increases the risk? You are more likely to develop this condition if:  You have a weak body defense system (immune system).  You do a lot of swimming or diving.  You use nasal sprays too much.  You smoke. What are the signs or symptoms? The main symptoms of this condition are pain and a feeling of pressure around the sinuses. Other symptoms include:  Stuffy nose (congestion).  Runny nose (drainage).  Swelling and warmth in the sinuses.  Headache.  Toothache.  A  cough that may get worse at night.  Mucus that collects in the throat or the back of the nose (postnasal drip).  Being unable to smell and taste.  Being very tired (fatigue).  A fever.  Sore throat.  Bad breath. How is this diagnosed? This condition is diagnosed based on:  Your symptoms.  Your medical history.  A physical exam.  Tests to find out if your condition is short-term (acute) or long-term (chronic). Your doctor may: ? Check your nose for growths (polyps). ? Check your sinuses using a tool that has a light (endoscope). ? Check for allergies or germs. ? Do imaging tests, such as an MRI or CT scan. How is this treated? Treatment for this condition depends on the cause and whether it is short-term or long-term.  If caused by a virus, your symptoms should go away on their own within 10 days. You may be given medicines to relieve symptoms. They include: ? Medicines that shrink swollen tissue in  the nose. ? Medicines that treat allergies (antihistamines). ? A spray that treats swelling of the nostrils. ? Rinses that help get rid of thick mucus in your nose (nasal saline washes).  If caused by bacteria, your doctor may wait to see if you will get better without treatment. You may be given antibiotic medicine if you have: ? A very bad infection. ? A weak body defense system.  If caused by growths in the nose, you may need to have surgery. Follow these instructions at home: Medicines  Take, use, or apply over-the-counter and prescription medicines only as told by your doctor. These may include nasal sprays.  If you were prescribed an antibiotic medicine, take it as told by your doctor. Do not stop taking the antibiotic even if you start to feel better. Hydrate and humidify   Drink enough water to keep your pee (urine) pale yellow.  Use a cool mist humidifier to keep the humidity level in your home above 50%.  Breathe in steam for 10-15 minutes, 3-4 times a day,  or as told by your doctor. You can do this in the bathroom while a hot shower is running.  Try not to spend time in cool or dry air. Rest  Rest as much as you can.  Sleep with your head raised (elevated).  Make sure you get enough sleep each night. General instructions   Put a warm, moist washcloth on your face 3-4 times a day, or as often as told by your doctor. This will help with discomfort.  Wash your hands often with soap and water. If there is no soap and water, use hand sanitizer.  Do not smoke. Avoid being around people who are smoking (secondhand smoke).  Keep all follow-up visits as told by your doctor. This is important. Contact a doctor if:  You have a fever.  Your symptoms get worse.  Your symptoms do not get better within 10 days. Get help right away if:  You have a very bad headache.  You cannot stop throwing up (vomiting).  You have very bad pain or swelling around your face or eyes.  You have trouble seeing.  You feel confused.  Your neck is stiff.  You have trouble breathing. Summary  Sinusitis is swelling of your sinuses. Sinuses are hollow spaces in the bones around your face.  This condition is caused by tissues in your nose that become inflamed or swollen. This traps germs. These can lead to infection.  If you were prescribed an antibiotic medicine, take it as told by your doctor. Do not stop taking it even if you start to feel better.  Keep all follow-up visits as told by your doctor. This is important. This information is not intended to replace advice given to you by your health care provider. Make sure you discuss any questions you have with your health care provider. Document Revised: 07/20/2017 Document Reviewed: 07/20/2017 Elsevier Patient Education  [place].

## 2019-04-06 ENCOUNTER — Ambulatory Visit: Payer: Medicare HMO

## 2019-04-06 NOTE — Telephone Encounter (Signed)
Left message on voicemail with information as instructed

## 2019-04-10 ENCOUNTER — Other Ambulatory Visit: Payer: Self-pay | Admitting: Internal Medicine

## 2019-04-14 ENCOUNTER — Other Ambulatory Visit: Payer: Self-pay

## 2019-04-14 ENCOUNTER — Encounter: Payer: Self-pay | Admitting: Allergy

## 2019-04-14 ENCOUNTER — Telehealth: Payer: Self-pay | Admitting: Internal Medicine

## 2019-04-14 ENCOUNTER — Ambulatory Visit: Payer: Medicare HMO | Admitting: Allergy

## 2019-04-14 VITALS — BP 146/72 | HR 95 | Temp 97.2°F | Resp 18 | Ht 61.0 in | Wt 139.0 lb

## 2019-04-14 DIAGNOSIS — J3089 Other allergic rhinitis: Secondary | ICD-10-CM | POA: Diagnosis not present

## 2019-04-14 DIAGNOSIS — H1013 Acute atopic conjunctivitis, bilateral: Secondary | ICD-10-CM | POA: Diagnosis not present

## 2019-04-14 DIAGNOSIS — J4540 Moderate persistent asthma, uncomplicated: Secondary | ICD-10-CM | POA: Diagnosis not present

## 2019-04-14 MED ORDER — OLOPATADINE HCL 0.2 % OP SOLN: 1.0000 [drp] | Freq: Every day | OPHTHALMIC | 5 refills | Status: DC | PRN

## 2019-04-14 MED ORDER — MONTELUKAST SODIUM 10 MG PO TABS: 10.0000 mg | ORAL_TABLET | Freq: Every day | ORAL | 5 refills | Status: DC

## 2019-04-14 MED ORDER — PROMETHAZINE-DM 6.25-15 MG/5ML PO SYRP: 5.0000 mL | ORAL_SOLUTION | Freq: Four times a day (QID) | ORAL | 0 refills | Status: DC | PRN

## 2019-04-14 MED ORDER — AZELASTINE HCL 0.1 % NA SOLN: 2.0000 | Freq: Two times a day (BID) | NASAL | 5 refills | Status: DC

## 2019-04-14 NOTE — Telephone Encounter (Signed)
refilled 

## 2019-04-14 NOTE — Progress Notes (Signed)
New Patient Note  RE: [name] MRN: [id number] DOB: April 26, 1965 Date of Office Visit: 04/14/2019  Referring provider: Jearld Fenton, NP Primary care provider: Jearld Fenton, NP  Chief Complaint: sinus issues  History of present illness: [name] is a 54 y.o. female presenting today for consultation for sinus issues.    She states she has issues with her sinuses constantly over the past year.  Constant nasal drainage, post-nasal drip, congestion, nasal pressure, itchy eyes.  Sometimes will need to throw up in the morning due to swallow mucus.  She also reports having a   productive cough, wheeze and chest tightness.  She was diagnosed with adult-onset asthma around 2000.   She does have [name] over the past 4 months that she takes 2 puffs a day now.   She feels like previous use of Advair was more effective but she does feel that [name] is working.  She denies needing use of albuterol since starting on Wixela.   For her sinus symptoms she states she has been taking zyrtec for the past year.  She also uses Flonase 1 spray each nostril daily which she does not feel does anything for her nasal symptoms.   She states she did go to a respiratory clinic last week and was prescribed Augmenti which she is finishing up this course.  She states this has really helped her nasal sinus symptoms.  She also was prescribed prednisone as well.   She states in the past year this is her first course of antibiotics.  However she has had 4-5 courses of prednisone for these sinus symptoms.    No history of food allergy or eczema.    Review of systems: Review of Systems  Constitutional: Negative.   HENT: Positive for congestion and sinus pain. Negative for ear discharge, ear pain, nosebleeds and sore throat.   Eyes: Negative.   Respiratory: Positive for cough and wheezing.   Cardiovascular: Negative.   Gastrointestinal: Negative.   Musculoskeletal: Negative.   Skin: Negative.     Neurological: Negative.     All other systems negative unless noted above in HPI  Past medical history: Past Medical History:  Diagnosis Date  . Anxiety   . Arthritis    neck, knees, shoulders  . Asthma   . Bipolar disorder ([place])    currently feeling MANIC- 10/02/2016  . Depression   . GERD (gastroesophageal reflux disease)   . History of blood transfusion    as a newborn   . History of lump of left breast   . Hyperlipidemia   . Lumbar pseudoarthrosis   . Motion sickness    cars  . OSA (obstructive sleep apnea) 01/09/2016   can't afford CPAP  . Personality disorder ([place])   . PONV (postoperative nausea and vomiting)   . Post traumatic stress disorder (PTSD)   . Substance abuse ([place])   . Synovial cyst     Past surgical history: Past Surgical History:  Procedure Laterality Date  . COLONOSCOPY WITH PROPOFOL N/A 11/04/2017   Procedure: COLONOSCOPY WITH PROPOFOL;  Surgeon: Lin Landsman, MD;  Location: [facility];  Service: Endoscopy;  Laterality: N/A;  . ESOPHAGOGASTRODUODENOSCOPY (EGD) WITH PROPOFOL N/A 11/04/2017   Procedure: ESOPHAGOGASTRODUODENOSCOPY (EGD) WITH PROPOFOL with biopsies;  Surgeon: Lin Landsman, MD;  Location: [facility];  Service: Endoscopy;  Laterality: N/A;  sleep apnea  . KNEE SURGERY Left    x5, post basketball injury  . LUMBAR LAMINECTOMY/DECOMPRESSION MICRODISCECTOMY  Left 01/22/2016   Procedure: Laminectomy for facet/synovial cyst - left - Lumbar four - lumbar five;  Surgeon: Earnie Larsson, MD;  Location: [facility];  Service: Neurosurgery;  Laterality: Left;  Laminectomy for facet/synovial cyst - left - Lumbar four - lumbar five  . POLYPECTOMY N/A 11/04/2017   Procedure: POLYPECTOMY INTESTINAL;  Surgeon: Lin Landsman, MD;  Location: [facility];  Service: Endoscopy;  Laterality: N/A;  . SHOULDER SURGERY Left    x2  . TOE SURGERY Bilateral    bone spurs    Family history:  Family History  Problem Relation Age of  Onset  . Heart disease Father   . Hyperlipidemia Father   . Alcohol abuse Brother   . Alcohol abuse Paternal Uncle   . Breast cancer Maternal Aunt        70's  . Colon cancer Neg Hx     Social history: Lives in a mobile home without carpeting with central cooling.  No pets in the home.  Dogs outside the home.  No concern for water damage, mildew or roaches in the home.  Currently not employed.  Smoking history of [place] 1 pack/d for 10 years with quit date in 1996.     Medication List: Current Outpatient Medications  Medication Sig Dispense Refill  . albuterol (VENTOLIN HFA) 108 (90 Base) MCG/ACT inhaler TAKE 2 PUFFS BY MOUTH EVERY 6 HOURS AS NEEDED FOR WHEEZE OR SHORTNESS OF BREATH 18 g 1  . amoxicillin-clavulanate (AUGMENTIN) 875-125 MG tablet Take 1 tablet by mouth 2 (two) times daily. 20 tablet 0  . aspirin 81 MG tablet Take 81 mg by mouth daily.    Marland Kitchen buPROPion (WELLBUTRIN XL) 150 MG 24 hr tablet Take 1 tablet (150 mg total) by mouth every morning. 90 tablet 0  . diphenoxylate-atropine (LOMOTIL) 2.5-0.025 MG tablet Take 1 tablet by mouth daily as needed for diarrhea or loose stools. 30 tablet 0  . gabapentin (NEURONTIN) 300 MG capsule Take 1 capsule by mouth 3 (three) times daily.    Marland Kitchen morphine (MS CONTIN) 15 MG 12 hr tablet Take 1 tablet by mouth 2 (two) times daily.     . predniSONE (DELTASONE) 20 MG tablet Take 3 PO QAM x3days, 2 PO QAM x3days, 1 PO QAM x3days 18 tablet 0  . promethazine-dextromethorphan (PROMETHAZINE-DM) 6.25-15 MG/5ML syrup Take 5 mLs by mouth 4 (four) times daily as needed for cough. 120 mL 0  . simvastatin (ZOCOR) 20 MG tablet Take 1 tablet (20 mg total) by mouth daily at 6 PM. SCHEDULE PHYSICAL 90 tablet 0  . tiZANidine (ZANAFLEX) 4 MG capsule Take 4 mg by mouth 3 (three) times daily.    . trazodone (DESYREL) 300 MG tablet Take 1 tablet (300 mg total) by mouth at bedtime as needed for sleep. 30 tablet 2  . WIXELA INHUB 100-50 MCG/DOSE AEPB TAKE 1 PUFF BY  MOUTH TWICE A DAY 60 each 3  . zonisamide (ZONEGRAN) 100 MG capsule Take 1 capsule (100 mg total) by mouth at bedtime. 90 capsule 0   No current facility-administered medications for this visit.    Known medication allergies: Allergies  Allergen Reactions  . Effexor [Venlafaxine] Other (See Comments)    UNSPECIFIED REACTION, headaches, felt funny, withdrawal with missed dose   . Lamictal [Lamotrigine] Rash     Physical examination: Blood pressure (!) 146/72, pulse 95, temperature (!) 97.2 F (36.2 C), temperature source Temporal, resp. rate 18, height 5\' 1"  (1.549 m), weight 139 lb (63 kg), SpO2  98 %.  General: Alert, interactive, in no acute distress. HEENT: PERRLA, TMs pearly gray, turbinates mildly edematous with clear discharge, post-pharynx non erythematous. Neck: Supple without lymphadenopathy. Lungs: Clear to auscultation without wheezing, rhonchi or rales. {no increased work of breathing. CV: Normal S1, S2 without murmurs. Abdomen: Nondistended, nontender. Skin: Warm and dry, without lesions or rashes. Extremities:  No clubbing, cyanosis or edema. Neuro:   Grossly intact.  Diagnositics/Labs:  Spirometry: FEV1: 2.99L 123%, FVC: 3.8L 123%, ratio consistent with nonobstrutive pattern  Allergy testing: environmental allergy skin prick testing is positive to [facility], timothy, sheep sorrell mouse.   Intradermal testing is positive to mold mix 4.   Allergy testing results were read and interpreted by provider, documented by clinical staff.   Assessment and plan: Allergic rhinitis with conjunctivitis Mod Persistent asthma   -Environmental allergy skin testing is positive to grasses, weeds, molds, mouse -Allergen avoidance measures discussed/handouts provided -Recommend starting Singulair 10 mg daily at bedtime.  Singulair is a leukotriene blocker that helps in managing allergy based symptoms as well as asthma.  If you notice any change in mood/behavior/sleep after starting  Singulair then stop this medication and let us know.  Symptoms resolve after stopping the medication. -Stop Zyrtec as this does not appear to be effective.  For long-acting antihistamine control can try Xyzal 5 mg daily as needed -For itchy watery eyes recommend use of Pataday 1 drop each eye daily -For nasal drainage control recommend use of nasal antihistamine Astelin 2 sprays each nostril twice a day -Allergen immunotherapy discussed today including protocol, benefits and risk.  Informational handout provided.  If interested in this therapuetic option you can check with your insurance carrier for coverage.  Let us know if you would like to proceed with this option.   -Continue Wixela 1 puff twice a day -have access to albuterol inhaler 2 puffs every 4-6 hours as needed for cough/wheeze/shortness of breath/chest tightness.  May use 15-20 minutes prior to activity.   Monitor frequency of use.    Follow-up 4 months or sooner if needed  I appreciate the opportunity to take part in [name]'s care. Please do not hesitate to contact me with questions.  Sincerely,   Prudy Feeler, MD Allergy/Immunology [facility] [place]

## 2019-04-14 NOTE — Addendum Note (Signed)
Addended by: Jearld Fenton on: 04/14/2019 11:32 AM   Modules accepted: Orders

## 2019-04-14 NOTE — Patient Instructions (Addendum)
-  Environmental allergy skin testing is positive to grasses, weeds, molds, mouse -Allergen avoidance measures discussed/handouts provided -Recommend starting Singulair 10 mg daily at bedtime.  Singulair is a leukotriene blocker that helps in managing allergy based symptoms as well as asthma.  If you notice any change in mood/behavior/sleep after starting Singulair then stop this medication and let us know.  Symptoms resolve after stopping the medication. -Stop Zyrtec as this does not appear to be effective.  For long-acting antihistamine control can try Xyzal 5 mg daily as needed -For itchy watery eyes recommend use of Pataday 1 drop each eye daily -For nasal drainage control recommend use of nasal antihistamine Astelin 2 sprays each nostril twice a day -Allergen immunotherapy discussed today including protocol, benefits and risk.  Informational handout provided.  If interested in this therapuetic option you can check with your insurance carrier for coverage.  Let us know if you would like to proceed with this option.   -Continue Wixela 1 puff twice a day -have access to albuterol inhaler 2 puffs every 4-6 hours as needed for cough/wheeze/shortness of breath/chest tightness.  May use 15-20 minutes prior to activity.   Monitor frequency of use.    Follow-up 4 months or sooner if needed

## 2019-04-14 NOTE — Telephone Encounter (Signed)
Pt states that she was seen by the [facility] on 04/05/19 - given a cough syrup Promethazine-DM, she is out and is requesting a refill to [facility].   Pt states that she is slowly improving, her cough is still bad with a lot of mucous production which is brownish-tan in color. Pt states that she still feels terrible overall. She completed her Prednisone Taper today 04/14/19, has one more day left of the Augmentin 875-125mg . .   Pt also has an appt with the Allergist today for testing - She was referred to ENT by the [facility].   Please advise, thanks.

## 2019-04-20 ENCOUNTER — Ambulatory Visit (INDEPENDENT_AMBULATORY_CARE_PROVIDER_SITE_OTHER): Payer: Medicare HMO | Admitting: Family Medicine

## 2019-04-20 ENCOUNTER — Encounter: Payer: Self-pay | Admitting: Family Medicine

## 2019-04-20 ENCOUNTER — Other Ambulatory Visit: Payer: Self-pay

## 2019-04-20 VITALS — Temp 97.5°F | Wt 149.0 lb

## 2019-04-20 DIAGNOSIS — J329 Chronic sinusitis, unspecified: Secondary | ICD-10-CM | POA: Diagnosis not present

## 2019-04-20 NOTE — Progress Notes (Signed)
I connected with [name] on 04/20/19 at 10:20 AM EST by video and verified that I am speaking with the correct person using two identifiers.   I discussed the limitations, risks, security and privacy concerns of performing an evaluation and management service by video and the availability of in person appointments. I also discussed with the patient that there may be a patient responsible charge related to this service. The patient expressed understanding and agreed to proceed.  Patient location: Home Provider Location: [facility] Participants: Lesleigh Noe and [name]   Subjective:     [name] is a 54 y.o. female presenting for Sinus Problem (sore throat, dry cough. Sx started last night.)     Sinusitis This is a recurrent problem. The current episode started in the past 7 days. There has been no fever. Associated symptoms include congestion, coughing, sinus pressure and a sore throat. Pertinent negatives include no chills, ear pain, headaches, shortness of breath or sneezing.    Ongoing symptoms for about 1 year Has had sinus symptoms for about 1 year Got allergy tested last week -- has several allergies  Treatment: mucinex, allergy medication, nose spray  Will occasionally get steroids for the cough  Low appetite No loss of taste or smell Sick contact - none  Covid testing - Jan 23 - negative   04/05/2019 - treated for sinus infection...prednisone and augment.   Review of Systems  Constitutional: Negative for chills.  HENT: Positive for congestion, sinus pressure and sore throat. Negative for ear pain and sneezing.   Respiratory: Positive for cough. Negative for shortness of breath.   Gastrointestinal: Negative for diarrhea and nausea.  Neurological: Negative for headaches.     Social History   Tobacco Use  Smoking Status Former Smoker  . Packs/day: 1.00  . Years: 1.00  . Pack years: 1.00  . Types: Cigarettes  . Quit date:  04/15/1996  . Years since quitting: 23.0  Smokeless Tobacco Never Used  Tobacco Comment   started back again in 2016, smoked for about 6 mo and then quit        Objective:   BP Readings from Last 3 Encounters:  04/14/19 (!) 146/72  04/05/19 (!) 180/110  12/10/18 130/82   Wt Readings from Last 3 Encounters:  04/20/19 149 lb (67.6 kg)  04/14/19 139 lb (63 kg)  04/05/19 136 lb (61.7 kg)    Temp (!) 97.5 F (36.4 C) Comment: per patient  Wt 149 lb (67.6 kg) Comment: per patient  BMI 28.15 kg/m    Physical Exam Constitutional:      Appearance: Normal appearance. She is not ill-appearing.  HENT:     Head: Normocephalic and atraumatic.     Right Ear: External ear normal.     Left Ear: External ear normal.  Eyes:     Conjunctiva/sclera: Conjunctivae normal.  Pulmonary:     Effort: Pulmonary effort is normal. No respiratory distress.  Neurological:     Mental Status: She is alert. Mental status is at baseline.  Psychiatric:        Mood and Affect: Mood normal.        Behavior: Behavior normal.        Thought Content: Thought content normal.        Judgment: Judgment normal.            Assessment & Plan:   Problem List Items Addressed This Visit    None  Visit Diagnoses    Chronic sinusitis, unspecified location    -  Primary   Relevant Orders   Ambulatory referral to ENT     Discussed that given new onset after a few days of symptoms improvement likely not bacterial. Would favor either viral or allergic.   1) Continue allergic treatment - recent allergy testing 2) Add Saline rinse/neti pot 3) symptomatic care for sore throat 4) Covid testing to rule out since new symptoms  Pt notes 1 year of recurrent sinus issues discussed that ENT referral may be helpful and this was placed  ER precautions discussed  Return if symptoms worsen or fail to improve.  Lesleigh Noe, MD

## 2019-04-29 ENCOUNTER — Other Ambulatory Visit: Payer: Self-pay | Admitting: Internal Medicine

## 2019-05-03 ENCOUNTER — Telehealth: Payer: Self-pay | Admitting: Internal Medicine

## 2019-05-03 DIAGNOSIS — G4733 Obstructive sleep apnea (adult) (pediatric): Secondary | ICD-10-CM | POA: Diagnosis not present

## 2019-05-03 NOTE — Telephone Encounter (Signed)
Left voicemail for patient phone appointment

## 2019-05-04 ENCOUNTER — Ambulatory Visit (INDEPENDENT_AMBULATORY_CARE_PROVIDER_SITE_OTHER): Payer: Medicare HMO | Admitting: Internal Medicine

## 2019-05-04 DIAGNOSIS — J0191 Acute recurrent sinusitis, unspecified: Secondary | ICD-10-CM

## 2019-05-04 DIAGNOSIS — Z7689 Persons encountering health services in other specified circumstances: Secondary | ICD-10-CM | POA: Diagnosis not present

## 2019-05-04 MED ORDER — AMOXICILLIN-POT CLAVULANATE 875-125 MG PO TABS: 1.0000 | ORAL_TABLET | Freq: Two times a day (BID) | ORAL | 0 refills | Status: DC

## 2019-05-04 MED ORDER — HYDROCODONE-HOMATROPINE 5-1.5 MG/5ML PO SYRP: 5.0000 mL | ORAL_SOLUTION | Freq: Three times a day (TID) | ORAL | 0 refills | Status: DC | PRN

## 2019-05-04 MED ORDER — PREDNISONE 50 MG PO TABS: 50.0000 mg | ORAL_TABLET | Freq: Every day | ORAL | 0 refills | Status: DC

## 2019-05-04 NOTE — Progress Notes (Signed)
Virtual Visit via Telephone Note  I connected with [name], on 05/04/2019 at 10:37 AM by telephone due to the COVID-19 pandemic and verified that I am speaking with the correct person using two identifiers.   Consent: I discussed the limitations, risks, security and privacy concerns of performing an evaluation and management service by telephone and the availability of in person appointments. I also discussed with the patient that there may be a patient responsible charge related to this service. The patient expressed understanding and agreed to proceed.   Location of Patient: Home   Location of Provider: Clinic    Persons participating in Telemedicine visit: Janilah Czar [facility] Dr. Juleen China      History of Present Illness: Patient has a visit to establish care. Patient has concerns about chronic sinus congestion. Has sinus pressure, rhinorrhea, dry cough, eyes water. Reports history of allergies but feels like she needs to see an ENT. Believes she has recurrent sinus infections, unable to classify how many per year. She is currently taking Singulair, Azelastine nasal spray, and Pataday. She has taken second generation antihistamines and used Flonase in the past without relief. She feels like she has a sinus infection now. She has worsening of her symptoms with increased nasal and sinus pressure/congestion. Believes she has had a fever.    Past Medical History:  Diagnosis Date  . Anxiety   . Arthritis    neck, knees, shoulders  . Asthma   . Bipolar disorder ([place])    currently feeling MANIC- 10/02/2016  . Depression   . GERD (gastroesophageal reflux disease)   . History of blood transfusion    as a newborn   . History of lump of left breast   . Hyperlipidemia   . Lumbar pseudoarthrosis   . Motion sickness    cars  . OSA (obstructive sleep apnea) 01/09/2016   can't afford CPAP  . Personality disorder ([place])   . PONV (postoperative nausea and vomiting)   .  Post traumatic stress disorder (PTSD)   . Substance abuse ([place])   . Synovial cyst    Allergies  Allergen Reactions  . Effexor [Venlafaxine] Other (See Comments)    UNSPECIFIED REACTION, headaches, felt funny, withdrawal with missed dose   . Lamictal [Lamotrigine] Rash    Current Outpatient Medications on File Prior to Visit  Medication Sig Dispense Refill  . albuterol (VENTOLIN HFA) 108 (90 Base) MCG/ACT inhaler TAKE 2 PUFFS BY MOUTH EVERY 6 HOURS AS NEEDED FOR WHEEZE OR SHORTNESS OF BREATH 18 g 1  . aspirin 81 MG tablet Take 81 mg by mouth daily.    Marland Kitchen azelastine (ASTELIN) 0.1 % nasal spray Place 2 sprays into both nostrils 2 (two) times daily. 30 mL 5  . buPROPion (WELLBUTRIN XL) 150 MG 24 hr tablet Take 1 tablet (150 mg total) by mouth every morning. 90 tablet 0  . diphenoxylate-atropine (LOMOTIL) 2.5-0.025 MG tablet Take 1 tablet by mouth daily as needed for diarrhea or loose stools. 30 tablet 0  . gabapentin (NEURONTIN) 300 MG capsule Take 1 capsule by mouth 3 (three) times daily.    . montelukast (SINGULAIR) 10 MG tablet Take 1 tablet (10 mg total) by mouth at bedtime. 30 tablet 5  . morphine (MS CONTIN) 15 MG 12 hr tablet Take 1 tablet by mouth 2 (two) times daily.     . Olopatadine HCl (PATADAY) 0.2 % SOLN Place 1 drop into both eyes daily as needed. 2.5 mL 5  . simvastatin (  ZOCOR) 20 MG tablet Take 1 tablet (20 mg total) by mouth daily at 6 PM. SCHEDULE PHYSICAL 90 tablet 0  . tiZANidine (ZANAFLEX) 4 MG capsule Take 4 mg by mouth 3 (three) times daily.    . trazodone (DESYREL) 300 MG tablet Take 1 tablet (300 mg total) by mouth at bedtime as needed for sleep. 30 tablet 2  . WIXELA INHUB 100-50 MCG/DOSE AEPB TAKE 1 PUFF BY MOUTH TWICE A DAY 60 each 3  . zonisamide (ZONEGRAN) 100 MG capsule Take 1 capsule (100 mg total) by mouth at bedtime. 90 capsule 0   No current facility-administered medications on file prior to visit.    Observations/Objective: NAD. Speaking clearly.  Work  of breathing normal.  Alert and oriented. Mood appropriate.   Assessment and Plan: 1. Encounter to establish care   2. Acute recurrent sinusitis, unspecified location - Ambulatory referral to ENT - amoxicillin-clavulanate (AUGMENTIN) 875-125 MG tablet; Take 1 tablet by mouth 2 (two) times daily.  Dispense: 20 tablet; Refill: 0 - HYDROcodone-homatropine (HYCODAN) 5-1.5 MG/5ML syrup; Take 5 mLs by mouth every 8 (eight) hours as needed for cough.  Dispense: 120 mL; Refill: 0 - predniSONE (DELTASONE) 50 MG tablet; Take 1 tablet (50 mg total) by mouth daily with breakfast.  Dispense: 5 tablet; Refill: 0    Follow Up Instructions: Annual exam    I discussed the assessment and treatment plan with the patient. The patient was provided an opportunity to ask questions and all were answered. The patient agreed with the plan and demonstrated an understanding of the instructions.   The patient was advised to call back or seek an in-person evaluation if the symptoms worsen or if the condition fails to improve as anticipated.     I provided 14 minutes total of non-face-to-face time during this encounter including median intraservice time, reviewing previous notes, investigations, ordering medications, medical decision making, coordinating care and patient verbalized understanding at the end of the visit.    Phill Myron, D.O. Primary Care at [facility]  05/04/2019, 10:37 AM

## 2019-05-12 ENCOUNTER — Other Ambulatory Visit: Payer: Self-pay

## 2019-05-12 ENCOUNTER — Encounter: Payer: Self-pay | Admitting: Allergy

## 2019-05-12 ENCOUNTER — Ambulatory Visit: Payer: Medicare HMO | Admitting: Allergy

## 2019-05-12 VITALS — BP 130/78 | HR 90 | Temp 97.5°F | Resp 16 | Ht 61.0 in | Wt 141.0 lb

## 2019-05-12 DIAGNOSIS — J0181 Other acute recurrent sinusitis: Secondary | ICD-10-CM | POA: Diagnosis not present

## 2019-05-12 DIAGNOSIS — J3089 Other allergic rhinitis: Secondary | ICD-10-CM

## 2019-05-12 DIAGNOSIS — H1013 Acute atopic conjunctivitis, bilateral: Secondary | ICD-10-CM | POA: Diagnosis not present

## 2019-05-12 DIAGNOSIS — J4540 Moderate persistent asthma, uncomplicated: Secondary | ICD-10-CM

## 2019-05-12 MED ORDER — HYDROCOD POLST-CPM POLST ER 10-8 MG/5ML PO SUER: 5.0000 mL | Freq: Two times a day (BID) | ORAL | 0 refills | Status: DC | PRN

## 2019-05-12 MED ORDER — MOXIFLOXACIN HCL 400 MG PO TABS: 400.0000 mg | ORAL_TABLET | Freq: Every day | ORAL | 0 refills | Status: AC

## 2019-05-12 MED ORDER — PREDNISONE 20 MG PO TABS: 20.0000 mg | ORAL_TABLET | Freq: Two times a day (BID) | ORAL | 0 refills | Status: AC

## 2019-05-12 NOTE — Patient Instructions (Addendum)
-  continue avoidance of grasses, weeds, molds, mouse -continue Singulair 10 mg daily at bedtime.   -stop Zyrtec as this does not appear to be effective.  For long-acting antihistamine control can try Xyzal 5 mg daily as needed -for itchy watery eyes recommend use of Pataday 1 drop each eye daily -for nasal drainage control recommend use of nasal antihistamine Astelin 2 sprays each nostril twice a day -severe nasal obstruction in both nostrils thus recommend prednisone 20mg  twice a day for 5 days to help "deswell" the nose.  When you can breathe better from the nose then start using Nasacort 2 sprays each nostril daily to help keep the nose open.  Continue Nasacort use after finishing prednisone.   -Continue Wixela 1 puff twice a day -have access to albuterol inhaler 2 puffs every 4-6 hours as needed for cough/wheeze/shortness of breath/chest tightness.  May use 15-20 minutes prior to activity.   Monitor frequency of use.   frequency of use.   -will broaden antibiotic coverage to Avelox 400 mg 1 tablet daily for 7 days. -will send in Tussionex cough syrup to use every 12hrs as needed for cough -will obtain labwork at this time to perform immunocompetence work-up at this time for recurrent sinus infections as well to look for other causes for asthma symptoms   **We are ordering labs, so please allow 1-2 weeks for the results to come back.  With the newly implemented Cures Act, the labs might be visible to you at the same time that they become visible to me.  However, I will not address the results until all of the results come  back, so please be patient.  In the meantime, continue avoiding your triggering food(s) in your After Visit Summary, including avoidance measures (if applicable), until you hear from me about the results.     Follow-up 3-4 months or sooner if needed

## 2019-05-12 NOTE — Progress Notes (Signed)
Follow-up Note  RE: [name] MRN: [id number] DOB: 1965/11/24 Date of Office Visit: 05/12/2019   History of present illness: [name] is a 54 y.o. female presenting today for follow-up of allergic rhinitis with conjunctivitis and moderate persistent asthma. She was last seen in the office on 04/14/2019 for her initial visit. After this visit I ordered a chest x-ray that was unremarkable. She states she continued to have significant symptoms of increased thick nasal drainage, increased cough, sore throat and sinus pressure. She states she did have negative Covid testing on 04/23/2019. She saw her PCP early March who prescribed her Augmentin course that she has 1 or 2 tablets left of as well as a prednisone 5-day course that she believes was 50 mg. She also was prescribed Hycodan but she states that she finished what she had. She states it did help with her cough. However she still reports having significant nasal drainage as well as congestion feeling pressure in h she did er sinuses, increased cough and sore throat. Does feel that she could breathe better when she was on the prednisone but the congestion returned off the prednisone. She states she constantly is having sinus infection type symptoms and being treated for sinus infections. She states she is using the Astelin nasal spray as she states it does help to a degree. She states she has not tried Xyzal yet. She is using her Wixela 1 puff twice a day.  Review of systems: Review of Systems  Constitutional: Positive for malaise/fatigue.  HENT: Positive for congestion, sinus pain and sore throat.   Eyes: Negative.   Respiratory: Positive for cough.   Cardiovascular: Negative.   Gastrointestinal: Negative.   Musculoskeletal: Negative.   Skin: Negative.   Neurological: Negative.     All other systems negative unless noted above in HPI  Past medical/social/surgical/family history have been reviewed and are unchanged unless  specifically indicated below.  No changes  Medication List: Current Outpatient Medications  Medication Sig Dispense Refill  . albuterol (VENTOLIN HFA) 108 (90 Base) MCG/ACT inhaler TAKE 2 PUFFS BY MOUTH EVERY 6 HOURS AS NEEDED FOR WHEEZE OR SHORTNESS OF BREATH 18 g 1  . amoxicillin-clavulanate (AUGMENTIN) 875-125 MG tablet Take 1 tablet by mouth 2 (two) times daily. 20 tablet 0  . aspirin 81 MG tablet Take 81 mg by mouth daily.    Marland Kitchen azelastine (ASTELIN) 0.1 % nasal spray Place 2 sprays into both nostrils 2 (two) times daily. 30 mL 5  . buPROPion (WELLBUTRIN XL) 150 MG 24 hr tablet Take 1 tablet (150 mg total) by mouth every morning. 90 tablet 0  . diphenoxylate-atropine (LOMOTIL) 2.5-0.025 MG tablet Take 1 tablet by mouth daily as needed for diarrhea or loose stools. 30 tablet 0  . gabapentin (NEURONTIN) 300 MG capsule Take 1 capsule by mouth 3 (three) times daily.    . montelukast (SINGULAIR) 10 MG tablet Take 1 tablet (10 mg total) by mouth at bedtime. 30 tablet 5  . morphine (MS CONTIN) 15 MG 12 hr tablet Take 1 tablet by mouth 2 (two) times daily.     . Olopatadine HCl (PATADAY) 0.2 % SOLN Place 1 drop into both eyes daily as needed. 2.5 mL 5  . simvastatin (ZOCOR) 20 MG tablet Take 1 tablet (20 mg total) by mouth daily at 6 PM. SCHEDULE PHYSICAL 90 tablet 0  . tiZANidine (ZANAFLEX) 4 MG capsule Take 4 mg by mouth 3 (three) times daily.    . trazodone (DESYREL)  300 MG tablet Take 1 tablet (300 mg total) by mouth at bedtime as needed for sleep. 30 tablet 2  . WIXELA INHUB 100-50 MCG/DOSE AEPB TAKE 1 PUFF BY MOUTH TWICE A DAY 60 each 3  . zonisamide (ZONEGRAN) 100 MG capsule Take 1 capsule (100 mg total) by mouth at bedtime. 90 capsule 0  . chlorpheniramine-HYDROcodone (TUSSIONEX PENNKINETIC ER) 10-8 MG/5ML SUER Take 5 mLs by mouth every 12 (twelve) hours as needed for up to 7 days for cough. 70 mL 0  . moxifloxacin (AVELOX) 400 MG tablet Take 1 tablet (400 mg total) by mouth daily at 8 pm  for 7 days. 7 tablet 0  . predniSONE (DELTASONE) 20 MG tablet Take 1 tablet (20 mg total) by mouth in the morning and at bedtime for 5 days. 10 tablet 0   No current facility-administered medications for this visit.     Known medication allergies: Allergies  Allergen Reactions  . Effexor [Venlafaxine] Other (See Comments)    UNSPECIFIED REACTION, headaches, felt funny, withdrawal with missed dose   . Lamictal [Lamotrigine] Rash     Physical examination: Blood pressure 130/78, pulse 90, temperature (!) 97.5 F (36.4 C), temperature source Temporal, resp. rate 16, height 5\' 1"  (1.549 m), weight 141 lb (64 kg), SpO2 97 %.  General: Alert, interactive, in no acute distress. HEENT: PERRLA, complete nasal obstruction with turbinate hypertrophy with clear drainage, TMs pearly gray, , post-pharynx non erythematous. Neck: Supple without lymphadenopathy. Lungs: Clear to auscultation without wheezing, rhonchi or rales. {no increased work of breathing. Coughing throughout visit CV: Normal S1, S2 without murmurs. Abdomen: Nondistended, nontender. Skin: Warm and dry, without lesions or rashes. Extremities:  No clubbing, cyanosis or edema. Neuro:   Grossly intact.  Diagnositics/Labs: None today  Assessment and plan: Allergic rhinitis with conjunctivitis Moderate persistent asthma not well controlled Recurrent sinusitis   -continue avoidance of grasses, weeds, molds, mouse -continue Singulair 10 mg daily at bedtime.   -stop Zyrtec as this does not appear to be effective.  For long-acting antihistamine control can try Xyzal 5 mg daily as needed -for itchy watery eyes recommend use of Pataday 1 drop each eye daily -for nasal drainage control recommend use of nasal antihistamine Astelin 2 sprays each nostril twice a day -severe nasal obstruction in both nostrils thus recommend prednisone 20mg  twice a day for 5 days to help "deswell" the nose.  When you can breathe better from the nose then  start using Nasacort 2 sprays each nostril daily to help keep the nose open.  Continue Nasacort use after finishing prednisone.   -Continue Wixela 1 puff twice a day -have access to albuterol inhaler 2 puffs every 4-6 hours as needed for cough/wheeze/shortness of breath/chest tightness.  May use 15-20 minutes prior to activity.   Monitor frequency of use.   -will broaden antibiotic coverage to Avelox 400 mg 1 tablet daily for 7 days.  I have discussed potential side effects with Avelox with her.  She does think she has had fluoroquinolones in the past without any issue. -will send in Tussionex cough syrup to use every 12hrs as needed for cough -will obtain labwork at this time to perform immunocompetence work-up at this time for recurrent sinus infections as well to look for other causes for asthma symptoms  Follow-up 3-4 months or sooner if needed  I appreciate the opportunity to take part in Jerita's care. Please do not hesitate to contact me with questions.  Sincerely,   Prudy Feeler, MD Allergy/Immunology  [facility]

## 2019-05-16 ENCOUNTER — Other Ambulatory Visit: Payer: Self-pay | Admitting: Unknown Physician Specialty

## 2019-05-16 DIAGNOSIS — J329 Chronic sinusitis, unspecified: Secondary | ICD-10-CM

## 2019-05-16 DIAGNOSIS — J309 Allergic rhinitis, unspecified: Secondary | ICD-10-CM | POA: Diagnosis not present

## 2019-05-20 LAB — IGE: IgE (Immunoglobulin E), Serum: 36 IU/mL (ref 6–495)

## 2019-05-20 LAB — COMPREHENSIVE METABOLIC PANEL
ALT: 23 IU/L (ref 0–32)
AST: 19 IU/L (ref 0–40)
Albumin/Globulin Ratio: 1.9 (ref 1.2–2.2)
Albumin: 4.2 g/dL (ref 3.8–4.9)
Alkaline Phosphatase: 75 IU/L (ref 39–117)
BUN/Creatinine Ratio: 8 — ABNORMAL LOW (ref 9–23)
BUN: 7 mg/dL (ref 6–24)
Bilirubin Total: 0.2 mg/dL (ref 0.0–1.2)
CO2: 23 mmol/L (ref 20–29)
Calcium: 9.3 mg/dL (ref 8.7–10.2)
Chloride: 99 mmol/L (ref 96–106)
Creatinine, Ser: 0.89 mg/dL (ref 0.57–1.00)
GFR calc Af Amer: 85 mL/min/{1.73_m2} (ref >59)
GFR calc non Af Amer: 74 mL/min/{1.73_m2} (ref >59)
Globulin, Total: 2.2 g/dL (ref 1.5–4.5)
Glucose: 107 mg/dL — ABNORMAL HIGH (ref 65–99)
Potassium: 4.1 mmol/L (ref 3.5–5.2)
Sodium: 136 mmol/L (ref 134–144)
Total Protein: 6.4 g/dL (ref 6.0–8.5)

## 2019-05-20 LAB — ALPHA-1-ANTITRYPSIN DEFICIENCY

## 2019-05-20 LAB — STREP PNEUMONIAE 23 SEROTYPES IGG
Pneumo Ab Type 1*: 0.6 ug/mL — ABNORMAL LOW (ref >1.3)
Pneumo Ab Type 12 (12F)*: <0.1 ug/mL — ABNORMAL LOW (ref >1.3)
Pneumo Ab Type 14*: 0.7 ug/mL — ABNORMAL LOW (ref >1.3)
Pneumo Ab Type 17 (17F)*: 1.3 ug/mL — ABNORMAL LOW (ref >1.3)
Pneumo Ab Type 19 (19F)*: 1.3 ug/mL — ABNORMAL LOW (ref >1.3)
Pneumo Ab Type 2*: 2 ug/mL (ref >1.3)
Pneumo Ab Type 20*: 2.2 ug/mL (ref >1.3)
Pneumo Ab Type 22 (22F)*: 0.6 ug/mL — ABNORMAL LOW (ref >1.3)
Pneumo Ab Type 23 (23F)*: 0.1 ug/mL — ABNORMAL LOW (ref >1.3)
Pneumo Ab Type 26 (6B)*: 0.8 ug/mL — ABNORMAL LOW (ref >1.3)
Pneumo Ab Type 3*: 1.4 ug/mL (ref >1.3)
Pneumo Ab Type 34 (10A)*: 0.8 ug/mL — ABNORMAL LOW (ref >1.3)
Pneumo Ab Type 4*: 1 ug/mL — ABNORMAL LOW (ref >1.3)
Pneumo Ab Type 43 (11A)*: 0.1 ug/mL — ABNORMAL LOW (ref >1.3)
Pneumo Ab Type 5*: 0.2 ug/mL — ABNORMAL LOW (ref >1.3)
Pneumo Ab Type 51 (7F)*: 0.5 ug/mL — ABNORMAL LOW (ref >1.3)
Pneumo Ab Type 54 (15B)*: 3.7 ug/mL (ref >1.3)
Pneumo Ab Type 56 (18C)*: 2.2 ug/mL (ref >1.3)
Pneumo Ab Type 57 (19A)*: 0.9 ug/mL — ABNORMAL LOW (ref >1.3)
Pneumo Ab Type 68 (9V)*: 0.8 ug/mL — ABNORMAL LOW (ref >1.3)
Pneumo Ab Type 70 (33F)*: 0.3 ug/mL — ABNORMAL LOW (ref >1.3)
Pneumo Ab Type 8*: 2.2 ug/mL (ref >1.3)
Pneumo Ab Type 9 (9N)*: 0.5 ug/mL — ABNORMAL LOW (ref >1.3)

## 2019-05-20 LAB — CBC WITH DIFFERENTIAL/PLATELET
Basophils Absolute: 0 10*3/uL (ref 0.0–0.2)
Basos: 0 %
EOS (ABSOLUTE): 0.2 10*3/uL (ref 0.0–0.4)
Eos: 3 %
Hematocrit: 42 % (ref 34.0–46.6)
Hemoglobin: 13.8 g/dL (ref 11.1–15.9)
Immature Grans (Abs): 0 10*3/uL (ref 0.0–0.1)
Immature Granulocytes: 0 %
Lymphocytes Absolute: 2.2 10*3/uL (ref 0.7–3.1)
Lymphs: 28 %
MCH: 30.5 pg (ref 26.6–33.0)
MCHC: 32.9 g/dL (ref 31.5–35.7)
MCV: 93 fL (ref 79–97)
Monocytes Absolute: 0.5 10*3/uL (ref 0.1–0.9)
Monocytes: 6 %
Neutrophils Absolute: 4.7 10*3/uL (ref 1.4–7.0)
Neutrophils: 63 %
Platelets: 417 10*3/uL (ref 150–450)
RBC: 4.53 x10E6/uL (ref 3.77–5.28)
RDW: 13.1 % (ref 11.7–15.4)
WBC: 7.7 10*3/uL (ref 3.4–10.8)

## 2019-05-20 LAB — IGG, IGA, IGM
IgA/Immunoglobulin A, Serum: 100 mg/dL (ref 87–352)
IgG (Immunoglobin G), Serum: 624 mg/dL (ref 586–1602)
IgM (Immunoglobulin M), Srm: 190 mg/dL (ref 26–217)

## 2019-05-20 LAB — ASPERGILLUS PRECIPITINS
A.Fumigatus #1 Abs: NEGATIVE
Aspergillus Flavus Antibodies: NEGATIVE
Aspergillus Niger Antibodies: NEGATIVE
Aspergillus glaucus IgG: NEGATIVE
Aspergillus nidulans IgG: NEGATIVE
Aspergillus terreus IgG: NEGATIVE

## 2019-05-20 LAB — COMPLEMENT, TOTAL: Compl, Total (CH50): >60 U/mL (ref >41)

## 2019-05-20 LAB — DIPHTHERIA / TETANUS ANTIBODY PANEL
Diphtheria Ab: 0.73 IU/mL (ref <0.10)
Tetanus Ab, IgG: 3.78 IU/mL (ref <0.10)

## 2019-05-23 ENCOUNTER — Telehealth: Payer: Self-pay

## 2019-05-23 NOTE — Telephone Encounter (Signed)
See my chart message

## 2019-05-25 ENCOUNTER — Ambulatory Visit
Admission: RE | Admit: 2019-05-25 | Discharge: 2019-05-25 | Disposition: A | Discharge status: Home / Self Care | Payer: Medicare HMO | Source: Ambulatory Visit | Attending: Unknown Physician Specialty | Admitting: Unknown Physician Specialty

## 2019-05-25 ENCOUNTER — Other Ambulatory Visit: Payer: Self-pay | Admitting: *Deleted

## 2019-05-25 ENCOUNTER — Other Ambulatory Visit: Payer: Self-pay

## 2019-05-25 DIAGNOSIS — J329 Chronic sinusitis, unspecified: Secondary | ICD-10-CM | POA: Insufficient documentation

## 2019-05-25 DIAGNOSIS — J0181 Other acute recurrent sinusitis: Secondary | ICD-10-CM

## 2019-05-30 ENCOUNTER — Other Ambulatory Visit: Payer: Self-pay

## 2019-05-30 ENCOUNTER — Ambulatory Visit (INDEPENDENT_AMBULATORY_CARE_PROVIDER_SITE_OTHER): Payer: Medicare HMO

## 2019-05-30 DIAGNOSIS — Z23 Encounter for immunization: Secondary | ICD-10-CM | POA: Diagnosis not present

## 2019-05-30 NOTE — Progress Notes (Signed)
Patient was given her Pneumococcal vaccine today in the clinic and filled out the consent form. She had answered yes to moderately or severely ill and was telling me she was seen most recently for her sinus and nasal congestion. She was given antibiotics and medications and states she felt the congestion went from her nasal passages towards her chest. After consulting with the provider on staff Dr. Ernst Bowler he advise to continue to give the immunization since she has no changes from the last time she was seen. Patient waited in the exam room 30 mins after her injection and had no complaints of any pain or other symptoms associated with fever or illness. Patient tolerated the injection well and is doing fine.

## 2019-06-02 DIAGNOSIS — S72009A Fracture of unspecified part of neck of unspecified femur, initial encounter for closed fracture: Secondary | ICD-10-CM

## 2019-06-02 HISTORY — DX: Fracture of unspecified part of neck of unspecified femur, initial encounter for closed fracture: S72.009A

## 2019-06-03 DIAGNOSIS — G4733 Obstructive sleep apnea (adult) (pediatric): Secondary | ICD-10-CM | POA: Diagnosis not present

## 2019-06-06 ENCOUNTER — Other Ambulatory Visit: Payer: Self-pay | Admitting: Internal Medicine

## 2019-06-08 DIAGNOSIS — T148XXA Other injury of unspecified body region, initial encounter: Secondary | ICD-10-CM | POA: Diagnosis not present

## 2019-06-08 DIAGNOSIS — M25552 Pain in left hip: Secondary | ICD-10-CM | POA: Diagnosis not present

## 2019-06-16 ENCOUNTER — Ambulatory Visit (INDEPENDENT_AMBULATORY_CARE_PROVIDER_SITE_OTHER): Payer: Medicare HMO | Admitting: Psychiatry

## 2019-06-16 ENCOUNTER — Other Ambulatory Visit: Payer: Self-pay

## 2019-06-16 DIAGNOSIS — F3131 Bipolar disorder, current episode depressed, mild: Secondary | ICD-10-CM | POA: Diagnosis not present

## 2019-06-16 MED ORDER — ZONISAMIDE 100 MG PO CAPS: 100.0000 mg | ORAL_CAPSULE | Freq: Every day | ORAL | 1 refills | Status: DC

## 2019-06-16 MED ORDER — BUPROPION HCL ER (XL) 150 MG PO TB24: 150.0000 mg | ORAL_TABLET | ORAL | 1 refills | Status: DC

## 2019-06-16 NOTE — Progress Notes (Signed)
BH MD/PA/NP OP Progress Note  06/16/2019 10:05 AM [name]  MRN:  [id number] Interview was conducted by phone and I verified that I was speaking with the correct person using two identifiers. I discussed the limitations of evaluation and management by telemedicine and  the availability of in person appointments. Patient expressed understanding and agreed to proceed.  Chief Complaint: "I had trouble breathing but it's better now".  HPI: 54yo single female withbipolar 1 disorder, most recent episode depressed,borderline personality disorder;hx of cannabis use disorderandalcohol use disorder in sustained remission.She is disabled.  Several medciation trials in the past - does not recall all meds she had been on. Very concerned about potential weight gain on meds (gained weight on Seroquel and Depakote).At her initial visit we decided to try a combination of lurasidone 20 mg and oxacarbazepine 150 mg bid - patient wanted to start on lowest doses possible.She developed hyponatremia and dizziness and it was discontinued. Zonisamide 100 mg at HS was started insteadand hyponatremia and dizziness resolved. She reports that she felt depressed in April last year;mood also improved untilSeptemberwhen she began feeling more depressed, tearful.We have increased dose of Latuda to 40 mg but she started to experience muscle twitching and eventually stopped taking it about a month ago.Lydiahad been on Wellbutrin previously for many years with good response (off it for at least 5 years). We restarted 150 mg of XL to begin with and she reports that her mood improved. There is still some residual depression but Ivannah does not wish Wellbutrin dose to be increased. Sleep is adequate after we addedtrazodone(300 mg); she does not need it often.She has been having problems with sinusitis/allergies lately but reports symptoms to have subsided.  Visit Diagnosis:    ICD-10-CM   1. Bipolar 1 disorder,  depressed, mild (HCC)  F31.31     Past Psychiatric History: Please see intake H&P.  Past Medical History:  Past Medical History:  Diagnosis Date  . Anxiety   . Arthritis    neck, knees, shoulders  . Asthma   . Bipolar disorder ([place])    currently feeling MANIC- 10/02/2016  . Depression   . GERD (gastroesophageal reflux disease)   . History of blood transfusion    as a newborn   . History of lump of left breast   . Hyperlipidemia   . Lumbar pseudoarthrosis   . Motion sickness    cars  . OSA (obstructive sleep apnea) 01/09/2016   can't afford CPAP  . Personality disorder ([place])   . PONV (postoperative nausea and vomiting)   . Post traumatic stress disorder (PTSD)   . Substance abuse ([place])   . Synovial cyst     Past Surgical History:  Procedure Laterality Date  . COLONOSCOPY WITH PROPOFOL N/A 11/04/2017   Procedure: COLONOSCOPY WITH PROPOFOL;  Surgeon: Lin Landsman, MD;  Location: [facility];  Service: Endoscopy;  Laterality: N/A;  . ESOPHAGOGASTRODUODENOSCOPY (EGD) WITH PROPOFOL N/A 11/04/2017   Procedure: ESOPHAGOGASTRODUODENOSCOPY (EGD) WITH PROPOFOL with biopsies;  Surgeon: Lin Landsman, MD;  Location: [facility];  Service: Endoscopy;  Laterality: N/A;  sleep apnea  . KNEE SURGERY Left    x5, post basketball injury  . LUMBAR LAMINECTOMY/DECOMPRESSION MICRODISCECTOMY Left 01/22/2016   Procedure: Laminectomy for facet/synovial cyst - left - Lumbar four - lumbar five;  Surgeon: Earnie Larsson, MD;  Location: [facility];  Service: Neurosurgery;  Laterality: Left;  Laminectomy for facet/synovial cyst - left - Lumbar four - lumbar five  . POLYPECTOMY N/A  11/04/2017   Procedure: POLYPECTOMY INTESTINAL;  Surgeon: Lin Landsman, MD;  Location: [facility];  Service: Endoscopy;  Laterality: N/A;  . SHOULDER SURGERY Left    x2  . TOE SURGERY Bilateral    bone spurs    Family Psychiatric History: Reviewed.  Family History:  Family History  Problem  Relation Age of Onset  . Heart disease Father   . Hyperlipidemia Father   . Alcohol abuse Brother   . Alcohol abuse Paternal Uncle   . Breast cancer Maternal Aunt        70's  . Colon cancer Neg Hx     Social History:  Social History   Socioeconomic History  . Marital status: Single    Spouse name: Not on file  . Number of children: 0  . Years of education: Not on file  . Highest education level: Bachelor's degree (e.g., BA, AB, BS)  Occupational History  . Not on file  Tobacco Use  . Smoking status: Former Smoker    Packs/day: 1.00    Years: 1.00    Pack years: 1.00    Types: Cigarettes    Quit date: 04/15/1996    Years since quitting: 23.1  . Smokeless tobacco: Never Used  . Tobacco comment: started back again in 2016, smoked for about 6 mo and then quit  Substance and Sexual Activity  . Alcohol use: No    Alcohol/week: 0.0 standard drinks    Comment: quit 20 years  . Drug use: Yes    Frequency: 14.0 times per week    Types: Marijuana    Comment: 20 yrs. ago- cocaine   . Sexual activity: Not Currently  Other Topics Concern  . Not on file  Social History Narrative  . Not on file   Social Determinants of Health   Financial Resource Strain:   . Difficulty of Paying Living Expenses:   Food Insecurity:   . Worried About Charity fundraiser in the Last Year:   . Arboriculturist in the Last Year:   Transportation Needs:   . Film/video editor (Medical):   Marland Kitchen Lack of Transportation (Non-Medical):   Physical Activity:   . Days of Exercise per Week:   . Minutes of Exercise per Session:   Stress:   . Feeling of Stress :   Social Connections:   . Frequency of Communication with Friends and Family:   . Frequency of Social Gatherings with Friends and Family:   . Attends Religious Services:   . Active Member of Clubs or Organizations:   . Attends Archivist Meetings:   Marland Kitchen Marital Status:     Allergies:  Allergies  Allergen Reactions  . Effexor  [Venlafaxine] Other (See Comments)    UNSPECIFIED REACTION, headaches, felt funny, withdrawal with missed dose   . Lamictal [Lamotrigine] Rash    Metabolic Disorder Labs: Lab Results  Component Value Date   HGBA1C 5.6 06/22/2018   No results found for: PROLACTIN Lab Results  Component Value Date   CHOL 165 06/22/2018   TRIG 111.0 06/22/2018   HDL 63.90 06/22/2018   CHOLHDL 3 06/22/2018   VLDL 22.2 06/22/2018   LDLCALC 79 06/22/2018   LDLCALC 75 05/21/2017   Lab Results  Component Value Date   TSH 1.30 03/15/2015    Therapeutic Level Labs: No results found for: LITHIUM No results found for: VALPROATE No components found for:  CBMZ  Current Medications: Current Outpatient Medications  Medication Sig Dispense  Refill  . albuterol (VENTOLIN HFA) 108 (90 Base) MCG/ACT inhaler TAKE 2 PUFFS BY MOUTH EVERY 6 HOURS AS NEEDED FOR WHEEZE OR SHORTNESS OF BREATH 18 g 1  . amoxicillin-clavulanate (AUGMENTIN) 875-125 MG tablet Take 1 tablet by mouth 2 (two) times daily. 20 tablet 0  . aspirin 81 MG tablet Take 81 mg by mouth daily.    Marland Kitchen azelastine (ASTELIN) 0.1 % nasal spray Place 2 sprays into both nostrils 2 (two) times daily. 30 mL 5  . buPROPion (WELLBUTRIN XL) 150 MG 24 hr tablet Take 1 tablet (150 mg total) by mouth every morning. 90 tablet 1  . diphenoxylate-atropine (LOMOTIL) 2.5-0.025 MG tablet Take 1 tablet by mouth daily as needed for diarrhea or loose stools. 30 tablet 0  . gabapentin (NEURONTIN) 300 MG capsule Take 1 capsule by mouth 3 (three) times daily.    . montelukast (SINGULAIR) 10 MG tablet Take 1 tablet (10 mg total) by mouth at bedtime. 30 tablet 5  . morphine (MS CONTIN) 15 MG 12 hr tablet Take 1 tablet by mouth 2 (two) times daily.     . Olopatadine HCl (PATADAY) 0.2 % SOLN Place 1 drop into both eyes daily as needed. 2.5 mL 5  . simvastatin (ZOCOR) 20 MG tablet TAKE 1 TABLET (20 MG TOTAL) BY MOUTH DAILY AT 6 PM. SCHEDULE PHYSICAL 90 tablet 0  . tiZANidine  (ZANAFLEX) 4 MG capsule Take 4 mg by mouth 3 (three) times daily.    . trazodone (DESYREL) 300 MG tablet Take 1 tablet (300 mg total) by mouth at bedtime as needed for sleep. 30 tablet 2  . WIXELA INHUB 100-50 MCG/DOSE AEPB TAKE 1 PUFF BY MOUTH TWICE A DAY 60 each 3  . zonisamide (ZONEGRAN) 100 MG capsule Take 1 capsule (100 mg total) by mouth at bedtime. 90 capsule 1   No current facility-administered medications for this visit.     Psychiatric Specialty Exam: Review of Systems  HENT: Positive for congestion.   All other systems reviewed and are negative.   There were no vitals taken for this visit.There is no height or weight on file to calculate BMI.  General Appearance: NA  Eye Contact:  NA  Speech:  Clear and Coherent and Normal Rate  Volume:  Normal  Mood:  Mildly depressed.  Affect:  NA  Thought Process:  Goal Directed and Linear  Orientation:  Full (Time, Place, and Person)  Thought Content: Logical   Suicidal Thoughts:  No  Homicidal Thoughts:  No  Memory:  Immediate;   Good Recent;   Good Remote;   Good  Judgement:  Good  Insight:  Good  Psychomotor Activity:  NA  Concentration:  Concentration: Good  Recall:  Good  Fund of Knowledge: Good  Language: Good  Akathisia:  Negative  Handed:  Right  AIMS (if indicated): not done  Assets:  Communication Skills Desire for Improvement Financial Resources/Insurance Housing Resilience  ADL's:  Intact  Cognition: WNL  Sleep:  Fair   Screenings: PHQ2-9     Office Visit from 06/22/2018 in [place] at [facility] Visit from 05/21/2017 in [place] at [facility] Visit from 09/12/2015 in [place] at [facility] Visit from 09/12/2014 in [place] at [facility] Total Score  0  0  0  1       Assessment and Plan: 54yo single female withbipolar 1 disorder, most recent episode depressed,borderline personality disorder;hx of cannabis use  disorderandalcohol use disorder  in sustained remission.She is disabled.  Several medciation trials in the past - does not recall all meds she had been on. Very concerned about potential weight gain on meds (gained weight on Seroquel and Depakote).At her initial visit we decided to try a combination of lurasidone 20 mg and oxacarbazepine 150 mg bid - patient wanted to start on lowest doses possible.She developed hyponatremia and dizziness and it was discontinued. Zonisamide 100 mg at HS was started insteadand hyponatremia and dizziness resolved. She reports that she felt depressed in April last year;mood also improved untilSeptemberwhen she began feeling more depressed, tearful.We have increased dose of Latuda to 40 mg but she started to experience muscle twitching and eventually stopped taking it about a month ago.Lydiahad been on Wellbutrin previously for many years with good response (off it for at least 5 years). We restarted 150 mg of XL to begin with and she reports that her mood improved. There is still some residual depression but Daisey does not wish Wellbutrin dose to be increased. Sleep is adequate after we addedtrazodone(300 mg); she does not need it often.She has been having problems with sinusitis/allergies lately but reports symptoms to have subsided.  Dx; Bipolar 1 disorder depressed mild  Plan:We continue bupropion, trazodone and zonisamide unchanged.Shewill return to clinic inthree months. The plan was discussed with patient who had an opportunity to ask questions and these were all answered. I spend20 minutes inphone consultation with the patient.    Stephanie Acre, MD 06/16/2019, 10:05 AM

## 2019-06-22 DIAGNOSIS — M4317 Spondylolisthesis, lumbosacral region: Secondary | ICD-10-CM | POA: Diagnosis not present

## 2019-06-22 DIAGNOSIS — M87052 Idiopathic aseptic necrosis of left femur: Secondary | ICD-10-CM | POA: Diagnosis not present

## 2019-07-06 DIAGNOSIS — M1612 Unilateral primary osteoarthritis, left hip: Secondary | ICD-10-CM | POA: Diagnosis not present

## 2019-07-06 DIAGNOSIS — M87052 Idiopathic aseptic necrosis of left femur: Secondary | ICD-10-CM | POA: Diagnosis not present

## 2019-07-13 DIAGNOSIS — S32402A Unspecified fracture of left acetabulum, initial encounter for closed fracture: Secondary | ICD-10-CM | POA: Diagnosis not present

## 2019-07-19 ENCOUNTER — Other Ambulatory Visit: Payer: Self-pay

## 2019-07-19 ENCOUNTER — Ambulatory Visit: Payer: Medicare HMO | Admitting: Orthopaedic Surgery

## 2019-07-19 ENCOUNTER — Encounter: Payer: Self-pay | Admitting: Orthopaedic Surgery

## 2019-07-19 DIAGNOSIS — M25552 Pain in left hip: Secondary | ICD-10-CM | POA: Insufficient documentation

## 2019-07-19 DIAGNOSIS — M7702 Medial epicondylitis, left elbow: Secondary | ICD-10-CM | POA: Insufficient documentation

## 2019-07-19 MED ORDER — MELOXICAM 7.5 MG PO TABS: 7.5000 mg | ORAL_TABLET | Freq: Two times a day (BID) | ORAL | 2 refills | Status: DC | PRN

## 2019-07-19 MED ORDER — BUPIVACAINE HCL 0.5 % IJ SOLN
1.0000 mL | INTRAMUSCULAR | Status: AC | PRN
  Administered 2019-07-19: 1 mL via INTRA_ARTICULAR

## 2019-07-19 MED ORDER — CALCIUM CARBONATE-VITAMIN D 500-200 MG-UNIT PO TABS: 1.0000 | ORAL_TABLET | Freq: Three times a day (TID) | ORAL | 6 refills | Status: AC

## 2019-07-19 MED ORDER — METHYLPREDNISOLONE ACETATE 40 MG/ML IJ SUSP
40.0000 mg | INTRAMUSCULAR | Status: AC | PRN
  Administered 2019-07-19: 40 mg via INTRA_ARTICULAR

## 2019-07-19 MED ORDER — LIDOCAINE HCL 1 % IJ SOLN
1.0000 mL | INTRAMUSCULAR | Status: AC | PRN
  Administered 2019-07-19: 1 mL

## 2019-07-19 NOTE — Progress Notes (Signed)
Office Visit Note   Patient: [name]           Date of Birth: 1965/08/03           MRN: [id number] Visit Date: 07/19/2019              Requested by: Nicolette Bang, DO [place],   [id number] PCP: Nicolette Bang, DO   Assessment & Plan: Visit Diagnoses:  1. Pain in left hip   2. Medial epicondylitis of left elbow     Plan: Impression is left acetabular stress fracture and the left elbow medial epicondylitis.  For the stress fracture I have sent in a prescription for Os-Cal and I do believe that she would benefit from a bone stimulator.  In terms of the medial epicondylitis this was injected today.  Questions encouraged and answered.  Follow-up as needed.  Follow-Up Instructions: Return if symptoms worsen or fail to improve.   Orders:  Orders Placed This Encounter  Procedures  . Medium Joint Inj   Meds ordered this encounter  Medications  . calcium-vitamin D (OSCAL WITH D) 500-200 MG-UNIT tablet    Sig: Take 1 tablet by mouth 3 (three) times daily.    Dispense:  90 tablet    Refill:  6  . meloxicam (MOBIC) 7.5 MG tablet    Sig: Take 1 tablet (7.5 mg total) by mouth 2 (two) times daily as needed for pain.    Dispense:  30 tablet    Refill:  2      Procedures: Medium Joint Inj (Left medial epicondyle) on 07/19/2019 3:02 PM Details: 22 G needle Medications: 1 mL lidocaine 1 %; 40 mg methylPREDNISolone acetate 40 MG/ML; 1 mL bupivacaine 0.5 % Outcome: tolerated well, no immediate complications      Clinical Data: No additional findings.   Subjective: Chief Complaint  Patient presents with  . Left Hip - Pain    [name] is a 54 year old female referral from Dr. Trenton Gammon of neurosurgery for evaluation of left hip pain.  She also is asking to be evaluated for a medial sided left elbow pain.  She is a chronic pain syndrome patient.  She recently had an MRI of her pelvis that showed left greater than right acetabular stress  fractures.  She is states that she has 7 out of 10 pain at all times.  The pain started before Easter.  Denies any injuries.  Takes gabapentin and tizanidine without significant relief.  The pain in her left hip is slightly improved.   Review of Systems  Constitutional: Negative.   HENT: Negative.   Eyes: Negative.   Respiratory: Negative.   Cardiovascular: Negative.   Endocrine: Negative.   Musculoskeletal: Negative.   Neurological: Negative.   Hematological: Negative.   Psychiatric/Behavioral: Negative.   All other systems reviewed and are negative.    Objective: Vital Signs: There were no vitals taken for this visit.  Physical Exam Vitals and nursing note reviewed.  Constitutional:      Appearance: She is well-developed.  HENT:     Head: Normocephalic and atraumatic.  Pulmonary:     Effort: Pulmonary effort is normal.  Abdominal:     Palpations: Abdomen is soft.  Musculoskeletal:     Cervical back: Neck supple.  Skin:    General: Skin is warm.     Capillary Refill: Capillary refill takes less than 2 seconds.  Neurological:     Mental Status: She is alert and oriented to  person, place, and time.  Psychiatric:        Behavior: Behavior normal.        Thought Content: Thought content normal.        Judgment: Judgment normal.     Ortho Exam Left hip exam shows mild discomfort with range of motion.  She does not limp. Left elbow shows tenderness along the medial epicondyle and with resistance to pronation and wrist flexion. Specialty Comments:  No specialty comments available.  Imaging: No results found.   PMFS History: Patient Active Problem List   Diagnosis Date Noted  . Medial epicondylitis of left elbow 07/19/2019  . Pain in left hip 07/19/2019  . Asthma exacerbation 11/22/2018  . GERD (gastroesophageal reflux disease) 06/22/2018  . Cannabis use disorder, moderate, dependence ([place]) 04/22/2018  . Insomnia 04/15/2018  . GAD (generalized anxiety  disorder) 05/23/2017  . OSA (obstructive sleep apnea) 01/09/2016  . Asthma 11/21/2015  . Chronic pain syndrome 11/21/2015  . HLD (hyperlipidemia) 09/12/2014  . Bipolar 1 disorder, depressed, mild ([place]) 09/12/2014   Past Medical History:  Diagnosis Date  . Anxiety   . Arthritis    neck, knees, shoulders  . Asthma   . Bipolar disorder ([place])    currently feeling MANIC- 10/02/2016  . Depression   . GERD (gastroesophageal reflux disease)   . History of blood transfusion    as a newborn   . History of lump of left breast   . Hyperlipidemia   . Lumbar pseudoarthrosis   . Motion sickness    cars  . OSA (obstructive sleep apnea) 01/09/2016   can't afford CPAP  . Personality disorder ([place])   . PONV (postoperative nausea and vomiting)   . Post traumatic stress disorder (PTSD)   . Substance abuse ([place])   . Synovial cyst     Family History  Problem Relation Age of Onset  . Heart disease Father   . Hyperlipidemia Father   . Alcohol abuse Brother   . Alcohol abuse Paternal Uncle   . Breast cancer Maternal Aunt        70's  . Colon cancer Neg Hx     Past Surgical History:  Procedure Laterality Date  . COLONOSCOPY WITH PROPOFOL N/A 11/04/2017   Procedure: COLONOSCOPY WITH PROPOFOL;  Surgeon: Lin Landsman, MD;  Location: [facility];  Service: Endoscopy;  Laterality: N/A;  . ESOPHAGOGASTRODUODENOSCOPY (EGD) WITH PROPOFOL N/A 11/04/2017   Procedure: ESOPHAGOGASTRODUODENOSCOPY (EGD) WITH PROPOFOL with biopsies;  Surgeon: Lin Landsman, MD;  Location: [facility];  Service: Endoscopy;  Laterality: N/A;  sleep apnea  . KNEE SURGERY Left    x5, post basketball injury  . LUMBAR LAMINECTOMY/DECOMPRESSION MICRODISCECTOMY Left 01/22/2016   Procedure: Laminectomy for facet/synovial cyst - left - Lumbar four - lumbar five;  Surgeon: Earnie Larsson, MD;  Location: [facility];  Service: Neurosurgery;  Laterality: Left;  Laminectomy for facet/synovial cyst - left - Lumbar four -  lumbar five  . POLYPECTOMY N/A 11/04/2017   Procedure: POLYPECTOMY INTESTINAL;  Surgeon: Lin Landsman, MD;  Location: [facility];  Service: Endoscopy;  Laterality: N/A;  . SHOULDER SURGERY Left    x2  . TOE SURGERY Bilateral    bone spurs   Social History   Occupational History  . Not on file  Tobacco Use  . Smoking status: Former Smoker    Packs/day: 1.00    Years: 1.00    Pack years: 1.00    Types: Cigarettes    Quit  date: 04/15/1996    Years since quitting: 23.2  . Smokeless tobacco: Never Used  . Tobacco comment: started back again in 2016, smoked for about 6 mo and then quit  Substance and Sexual Activity  . Alcohol use: No    Alcohol/week: 0.0 standard drinks    Comment: quit 20 years  . Drug use: Yes    Frequency: 14.0 times per week    Types: Marijuana    Comment: 20 yrs. ago- cocaine   . Sexual activity: Not Currently

## 2019-07-25 ENCOUNTER — Telehealth: Payer: Self-pay | Admitting: Orthopaedic Surgery

## 2019-07-25 DIAGNOSIS — S32402K Unspecified fracture of left acetabulum, subsequent encounter for fracture with nonunion: Secondary | ICD-10-CM | POA: Diagnosis not present

## 2019-07-25 NOTE — Telephone Encounter (Signed)
Advised them [name] were probably taken at Dr. Trenton Gammon of Neurosurgery

## 2019-07-25 NOTE — Telephone Encounter (Signed)
Colletta Maryland with [facility] called stating that they need 2 sets of x-rays for the patient.  They are needing the 1st x-ray that the patient had at their initial visit for the hip fracture faxed over to her at Fax#[phone]. [phone].  Thank you.

## 2019-07-26 ENCOUNTER — Telehealth: Payer: Self-pay | Admitting: Orthopaedic Surgery

## 2019-07-26 NOTE — Telephone Encounter (Signed)
Received call from [place] case manager with [facility] needing a call back concerning the bone growth stimulator. Forbes Cellar said to ask for her and you will be transferred. The number to contact Colletta Maryland is [phone] The reference number is [id number]

## 2019-07-27 ENCOUNTER — Ambulatory Visit: Payer: Medicare HMO | Admitting: Orthopaedic Surgery

## 2019-07-27 ENCOUNTER — Other Ambulatory Visit: Payer: Self-pay | Admitting: Internal Medicine

## 2019-07-27 DIAGNOSIS — Z1231 Encounter for screening mammogram for malignant neoplasm of breast: Secondary | ICD-10-CM

## 2019-07-27 NOTE — Telephone Encounter (Signed)
Stephanie from [facility] would like a call back to clarify some information that you provided.  Thank you.

## 2019-07-28 NOTE — Telephone Encounter (Signed)
Colletta Maryland, Nurse Case Manager with [facility] is needing a call back from you in regards to the previous messages. Thank you.

## 2019-07-29 NOTE — Telephone Encounter (Signed)
Called [place]. She was not available spoke to someone else and kept me on hold for so long and stated that she wasn't available. I told him I could not continue being on hold. She can call us back and give [place] more details on what is needed. He states that they were needing copies of the xrays. I advised him that we did not take any xrays here. I told him that I had already spoken to someone else regarding this. I did tell him that the Xray's were probably taken at Dr. Trenton Gammon of Neurosurgery. See other message.

## 2019-07-31 ENCOUNTER — Other Ambulatory Visit: Payer: Self-pay | Admitting: Internal Medicine

## 2019-08-05 ENCOUNTER — Other Ambulatory Visit: Payer: Self-pay | Admitting: Internal Medicine

## 2019-08-05 ENCOUNTER — Other Ambulatory Visit: Payer: Self-pay | Admitting: Allergy

## 2019-08-09 ENCOUNTER — Telehealth: Payer: Self-pay

## 2019-08-09 NOTE — Telephone Encounter (Signed)

## 2019-08-09 NOTE — Patient Instructions (Signed)

## 2019-08-10 ENCOUNTER — Other Ambulatory Visit: Payer: Self-pay

## 2019-08-10 ENCOUNTER — Other Ambulatory Visit (HOSPITAL_COMMUNITY)
Admission: RE | Admit: 2019-08-10 | Discharge: 2019-08-10 | Disposition: A | Discharge status: Home / Self Care | Payer: Medicare HMO | Source: Ambulatory Visit | Attending: Internal Medicine | Admitting: Internal Medicine

## 2019-08-10 ENCOUNTER — Ambulatory Visit (INDEPENDENT_AMBULATORY_CARE_PROVIDER_SITE_OTHER): Payer: Medicare HMO | Admitting: Internal Medicine

## 2019-08-10 ENCOUNTER — Encounter: Payer: Self-pay | Admitting: Internal Medicine

## 2019-08-10 VITALS — BP 126/88 | HR 79 | Temp 97.2°F | Resp 17 | Ht 61.0 in | Wt 129.0 lb

## 2019-08-10 DIAGNOSIS — Z1151 Encounter for screening for human papillomavirus (HPV): Secondary | ICD-10-CM | POA: Insufficient documentation

## 2019-08-10 DIAGNOSIS — Z780 Asymptomatic menopausal state: Secondary | ICD-10-CM | POA: Insufficient documentation

## 2019-08-10 DIAGNOSIS — Z0000 Encounter for general adult medical examination without abnormal findings: Secondary | ICD-10-CM

## 2019-08-10 DIAGNOSIS — Z124 Encounter for screening for malignant neoplasm of cervix: Secondary | ICD-10-CM | POA: Diagnosis not present

## 2019-08-10 DIAGNOSIS — Z1159 Encounter for screening for other viral diseases: Secondary | ICD-10-CM

## 2019-08-10 DIAGNOSIS — M84353S Stress fracture, unspecified femur, sequela: Secondary | ICD-10-CM | POA: Diagnosis not present

## 2019-08-10 NOTE — Progress Notes (Signed)
Subjective:    [name] - 54 y.o. female MRN [id number]  Date of birth: 07-20-1965  HPI  [name] is here for annual exam. Has no concerns today. Declines STD testing.      Health Maintenance:  Health Maintenance Due  Topic Date Due   Hepatitis C Screening  Never done   COVID-19 Vaccine (1) Never done   PAP SMEAR-Modifier  02/14/2019    -  reports that she quit smoking about 23 years ago. Her smoking use included cigarettes. She has a 1.00 pack-year smoking history. She has never used smokeless tobacco. - Review of Systems: Per HPI. - Past Medical History: Patient Active Problem List   Diagnosis Date Noted   Medial epicondylitis of left elbow 07/19/2019   Pain in left hip 07/19/2019   Asthma exacerbation 11/22/2018   GERD (gastroesophageal reflux disease) 06/22/2018   Cannabis use disorder, moderate, dependence ([place]) 04/22/2018   Insomnia 04/15/2018   GAD (generalized anxiety disorder) 05/23/2017   OSA (obstructive sleep apnea) 01/09/2016   Asthma 11/21/2015   Chronic pain syndrome 11/21/2015   HLD (hyperlipidemia) 09/12/2014   Bipolar 1 disorder, depressed, mild ([place]) 09/12/2014   - Medications: reviewed and updated   Objective:   Physical Exam There were no vitals taken for this visit. Physical Exam  Constitutional: She is oriented to person, place, and time and well-developed, well-nourished, and in no distress.  HENT:  Head: Normocephalic and atraumatic.  Mouth/Throat: Oropharynx is clear and moist.  TMs normal bilaterally   Eyes: Pupils are equal, round, and reactive to light. Conjunctivae and EOM are normal.  Neck: No thyromegaly present.  Cardiovascular: Normal rate, regular rhythm, normal heart sounds and intact distal pulses.  No murmur heard. Pulmonary/Chest: Effort normal and breath sounds normal. No respiratory distress. She has no wheezes.  Abdominal: Soft. Bowel sounds are normal. She exhibits no distension. There is no  abdominal tenderness. There is no rebound and no guarding.  Genitourinary:    Genitourinary Comments: GU/GYN: Exam performed in the presence of a chaperone. External genitalia within normal limits.  Vaginal mucosa pink, moist, normal rugae.  Nonfriable cervix without lesions, no discharge or bleeding noted on speculum exam.  Bimanual exam revealed normal, nongravid uterus.  No cervical motion tenderness. No adnexal masses bilaterally.     Musculoskeletal:        General: No deformity or edema. Normal range of motion.     Cervical back: Normal range of motion and neck supple.  Lymphadenopathy:    She has no cervical adenopathy.  Neurological: She is alert and oriented to person, place, and time. Gait normal.  Skin: Skin is warm and dry. No rash noted. She is not diaphoretic.  Psychiatric: Mood, affect and judgment normal.           Assessment & Plan:   1. Annual physical exam Counseled on 150 minutes of exercise per week, healthy eating (including decreased daily intake of saturated fats, cholesterol, added sugars, sodium), STI prevention, routine healthcare maintenance. Mammogram is scheduled for 6/23.  Recent CBC and CMET were unremarkable.  - Lipid Panel  2. Pap smear for cervical cancer screening - Cytology - PAP([place])  3. Need for hepatitis C screening test - Hepatitis C Antibody  4. Stress fracture of femur, unspecified laterality, sequela Followed by orthopedics. Will check Vit D level.  - VITAMIN D 25 Hydroxy (Vit-D Deficiency, Fractures)     Phill Myron, D.O. 08/10/2019, 8:37 AM Primary Care at [facility]

## 2019-08-11 LAB — CYTOLOGY - PAP
Comment: NEGATIVE
Diagnosis: NEGATIVE
High risk HPV: NEGATIVE

## 2019-08-11 LAB — LIPID PANEL
Chol/HDL Ratio: 2.4 ratio (ref 0.0–4.4)
Cholesterol, Total: 168 mg/dL (ref 100–199)
HDL: 71 mg/dL (ref >39)
LDL Chol Calc (NIH): 79 mg/dL (ref 0–99)
Triglycerides: 103 mg/dL (ref 0–149)
VLDL Cholesterol Cal: 18 mg/dL (ref 5–40)

## 2019-08-11 LAB — HEPATITIS C ANTIBODY: Hep C Virus Ab: <0.1 s/co ratio (ref 0.0–0.9)

## 2019-08-11 LAB — VITAMIN D 25 HYDROXY (VIT D DEFICIENCY, FRACTURES): Vit D, 25-Hydroxy: 68.6 ng/mL (ref 30.0–100.0)

## 2019-08-12 NOTE — Progress Notes (Signed)
Patient notified of results & recommendations via MyChart per request.

## 2019-08-24 ENCOUNTER — Other Ambulatory Visit: Payer: Self-pay

## 2019-08-24 ENCOUNTER — Ambulatory Visit
Admission: RE | Admit: 2019-08-24 | Discharge: 2019-08-24 | Disposition: A | Discharge status: Home / Self Care | Payer: Medicare HMO | Source: Ambulatory Visit | Attending: Internal Medicine | Admitting: Internal Medicine

## 2019-08-24 DIAGNOSIS — Z1231 Encounter for screening mammogram for malignant neoplasm of breast: Secondary | ICD-10-CM

## 2019-08-28 ENCOUNTER — Other Ambulatory Visit: Payer: Self-pay | Admitting: Internal Medicine

## 2019-08-30 ENCOUNTER — Telehealth: Payer: Self-pay | Admitting: Internal Medicine

## 2019-08-30 NOTE — Telephone Encounter (Signed)
1) Medication(s) Requested (by name):albuterol (VENTOLIN HFA) 108 (90 Base) MCG/ACT inhaler [[id number]]  Fluticasone-Salmeterol (WIXELA INHUB) 100-50 MCG/DOSE AEPB [[id number]]    2) Pharmacy of Choice:[id number] - [place], [place]  [address], [place] [address]   3) Special Requests:   Approved medications will be sent to the pharmacy, we will reach out if there is an issue.  Requests made after 3pm may not be addressed until the following business day!  If a patient is unsure of the name of the medication(s) please note and ask patient to call back when they are able to provide all info, do not send to responsible party until all information is available!

## 2019-08-31 NOTE — Telephone Encounter (Signed)
Refills should come from Allergy & Asthma specialist.

## 2019-09-14 ENCOUNTER — Encounter: Payer: Self-pay | Admitting: Allergy

## 2019-09-14 ENCOUNTER — Other Ambulatory Visit: Payer: Self-pay

## 2019-09-14 ENCOUNTER — Ambulatory Visit: Payer: Medicare HMO | Admitting: Allergy

## 2019-09-14 VITALS — BP 102/60 | HR 79 | Resp 18 | Ht 61.0 in | Wt 139.0 lb

## 2019-09-14 DIAGNOSIS — H1013 Acute atopic conjunctivitis, bilateral: Secondary | ICD-10-CM

## 2019-09-14 DIAGNOSIS — J0181 Other acute recurrent sinusitis: Secondary | ICD-10-CM

## 2019-09-14 DIAGNOSIS — J3089 Other allergic rhinitis: Secondary | ICD-10-CM

## 2019-09-14 DIAGNOSIS — J4540 Moderate persistent asthma, uncomplicated: Secondary | ICD-10-CM | POA: Diagnosis not present

## 2019-09-14 MED ORDER — HYDROCOD POLST-CPM POLST ER 10-8 MG/5ML PO SUER: 5.0000 mL | Freq: Two times a day (BID) | ORAL | 0 refills | Status: AC | PRN

## 2019-09-14 MED ORDER — MONTELUKAST SODIUM 10 MG PO TABS: 10.0000 mg | ORAL_TABLET | Freq: Every day | ORAL | 5 refills | Status: DC

## 2019-09-14 MED ORDER — FLUTICASONE-SALMETEROL 100-50 MCG/DOSE IN AEPB: 1.0000 | INHALATION_SPRAY | Freq: Two times a day (BID) | RESPIRATORY_TRACT | 5 refills | Status: DC

## 2019-09-14 NOTE — Progress Notes (Signed)
Follow-up Note  RE: Carine Nordgren MRN: [id number] DOB: 1965-12-25 Date of Office Visit: 09/14/2019   History of present illness: [name] is a 54 y.o. female presenting today for follow-up of allergic rhinoconjunctivitis and asthma. She was last seen in the office on March 11th, 2021 by Dr. Nelva Bush.  Allergic rhinoconjunctivitis Since her last visit, she reports that her allergy symptoms have been similar to prior. She attributes her frequent rhinorrhea, sneezing, congestion, itchy/watery eyes to the season. She has been using singulair and xyzal every night, astelin as needed and eye drops daily. She has not been using Nasacort since her visit with an ENT in [place] recently who switched her to a reportedly cheaper option. She is not sure of the name or practice of the ENT, or the nasal spray that she has been using. She is interested in allergy immunotherapy for control of her symptoms.  Asthma She reports that she has been breathing better since her last visit. She has been using Wixela twice daily and has only needed to use her albuterol inhaler approximately three times in the past month. She has not had ER/urgent care visits for her asthma and has not been prescribed prednisone since her last visit.  She does state Tussionex was very helpful at last visit to help manage her cough symptoms.  Review of systems: Review of Systems  Constitutional: Positive for malaise/fatigue. Negative for chills and fever.  HENT: Positive for congestion.   Respiratory: Positive for cough. Negative for shortness of breath and wheezing.   Gastrointestinal: Negative.   Musculoskeletal: Negative.   Skin: Negative for itching and rash.  Neurological: Negative.   Endo/Heme/Allergies: Positive for environmental allergies.    All other systems negative unless noted above in HPI  Past medical/social/surgical/family history have been reviewed and are unchanged unless specifically indicated  below.  No changes  Medication List: Current Outpatient Medications  Medication Sig Dispense Refill  . albuterol (VENTOLIN HFA) 108 (90 Base) MCG/ACT inhaler TAKE 2 PUFFS BY MOUTH EVERY 6 HOURS AS NEEDED FOR WHEEZE OR SHORTNESS OF BREATH 8 g 1  . aspirin 81 MG tablet Take 81 mg by mouth daily.    Marland Kitchen azelastine (ASTELIN) 0.1 % nasal spray Place 2 sprays into both nostrils 2 (two) times daily. 30 mL 5  . buPROPion (WELLBUTRIN XL) 150 MG 24 hr tablet Take 1 tablet (150 mg total) by mouth every morning. 90 tablet 1  . calcium-vitamin D (OSCAL WITH D) 500-200 MG-UNIT tablet Take 1 tablet by mouth 3 (three) times daily. 90 tablet 6  . diphenoxylate-atropine (LOMOTIL) 2.5-0.025 MG tablet Take 1 tablet by mouth daily as needed for diarrhea or loose stools. 30 tablet 0  . Fluticasone-Salmeterol (WIXELA INHUB) 100-50 MCG/DOSE AEPB Inhale 1 puff into the lungs in the morning and at bedtime. MUST SCHEDULE PHYSICAL 60 each 5  . gabapentin (NEURONTIN) 300 MG capsule Take 1 capsule by mouth 3 (three) times daily.    . meloxicam (MOBIC) 7.5 MG tablet Take 1 tablet (7.5 mg total) by mouth 2 (two) times daily as needed for pain. 30 tablet 2  . montelukast (SINGULAIR) 10 MG tablet Take 1 tablet (10 mg total) by mouth at bedtime. 30 tablet 5  . morphine (MS CONTIN) 15 MG 12 hr tablet Take 1 tablet by mouth 2 (two) times daily.     . Olopatadine HCl 0.2 % SOLN PLACE 1 DROP INTO BOTH EYES DAILY AS NEEDED. 7.5 mL 1  . simvastatin (ZOCOR) 20  MG tablet TAKE 1 TABLET (20 MG TOTAL) BY MOUTH DAILY AT 6 PM. SCHEDULE PHYSICAL 90 tablet 0  . tiZANidine (ZANAFLEX) 4 MG capsule Take 4 mg by mouth 3 (three) times daily.    Marland Kitchen zonisamide (ZONEGRAN) 100 MG capsule Take 1 capsule (100 mg total) by mouth at bedtime. 90 capsule 1  . chlorpheniramine-HYDROcodone (TUSSIONEX PENNKINETIC ER) 10-8 MG/5ML SUER Take 5 mLs by mouth every 12 (twelve) hours as needed for up to 14 days for cough. 140 mL 0  . tiZANidine (ZANAFLEX) 4 MG tablet      . trazodone (DESYREL) 300 MG tablet Take 1 tablet (300 mg total) by mouth at bedtime as needed for sleep. 30 tablet 2   No current facility-administered medications for this visit.     Known medication allergies: Allergies  Allergen Reactions  . Other   . Effexor [Venlafaxine] Other (See Comments)    UNSPECIFIED REACTION, headaches, felt funny, withdrawal with missed dose   . Lamictal [Lamotrigine] Rash     Physical examination: Blood pressure 102/60, pulse 79, resp. rate 18, height 5\' 1"  (1.549 m), weight 139 lb (63 kg), SpO2 96 %.  General: Alert, interactive, in no acute distress. HEENT: TMs pearly gray, turbinates markedly edematous with clear discharge, post-pharynx non erythematous. Neck: Supple without lymphadenopathy. Lungs: Clear to auscultation without wheezing, rhonchi or rales. {no increased work of breathing. CV: Normal S1, S2 without murmurs. Abdomen: Nondistended, nontender. Skin: Warm and dry, without lesions or rashes. Extremities:  No clubbing, cyanosis or edema. Neuro:   Grossly intact.  Diagnositics/Labs: Spirometry: FEV1: 2.74, FVC: 3.10, ratio consistent with normal ventilatory function  Component     Latest Ref Rng & Units 05/12/2019  Pneumo Ab Type 1*     >1.3 ug/mL 0.6 (L)  Pneumo Ab Type 3*     >1.3 ug/mL 1.4  Pneumo Ab Type 4*     >1.3 ug/mL 1.0 (L)  Pneumo Ab Type 8*     >1.3 ug/mL 2.2  Pneumo Ab Type 9 (9N)*     >1.3 ug/mL 0.5 (L)  Pneumo Ab Type 12 (71F)*     >1.3 ug/mL <0.1 (L)  Pneumo Ab Type 14*     >1.3 ug/mL 0.7 (L)  Pneumo Ab Type 17 (61F)*     >1.3 ug/mL 1.3 (L)  Pneumo Ab Type 19 (171F)*     >1.3 ug/mL 1.3 (L)  Pneumo Ab Type 2*     >1.3 ug/mL 2.0  Pneumo Ab Type 20*     >1.3 ug/mL 2.2  Pneumo Ab Type 22 (471F)*     >1.3 ug/mL 0.6 (L)  Pneumo Ab Type 23 (79F)*     >1.3 ug/mL 0.1 (L)  Pneumo Ab Type 26 (6B)*     >1.3 ug/mL 0.8 (L)  Pneumo Ab Type 34 (10A)*     >1.3 ug/mL 0.8 (L)  Pneumo Ab Type 43 (11A)*     >1.3  ug/mL 0.1 (L)  Pneumo Ab Type 5*     >1.3 ug/mL 0.2 (L)  Pneumo Ab Type 51 (71F)*     >1.3 ug/mL 0.5 (L)  Pneumo Ab Type 54 (15B)*     >1.3 ug/mL 3.7  Pneumo Ab Type 56 (18C)*     >1.3 ug/mL 2.2  Pneumo Ab Type 57 (19A)*     >1.3 ug/mL 0.9 (L)  Pneumo Ab Type 68 (9V)*     >1.3 ug/mL 0.8 (L)  Pneumo Ab Type 70 (71F)*     >1.3 ug/mL 0.3 (L)  WBC     3.4 - 10.8 x10E3/uL 7.7  RBC     3.77 - 5.28 x10E6/uL 4.53  Hemoglobin     11.1 - 15.9 g/dL 13.8  HCT     34.0 - 46.6 % 42.0  MCV     79 - 97 fL 93  MCH     26.6 - 33.0 pg 30.5  MCHC     31 - 35 g/dL 32.9  RDW     11.7 - 15.4 % 13.1  Platelets     150 - 450 x10E3/uL 417  Neutrophils     Not Estab. % 63  Lymphs     Not Estab. % 28  Monocytes     Not Estab. % 6  Eos     Not Estab. % 3  Basos     Not Estab. % 0  NEUT#     1 - 7 x10E3/uL 4.7  Lymphocyte #     0 - 3 x10E3/uL 2.2  Monocytes Absolute     0 - 0 x10E3/uL 0.5  EOS (ABSOLUTE)     0.0 - 0.4 x10E3/uL 0.2  Basophils Absolute     0 - 0 x10E3/uL 0.0  Immature Granulocytes     Not Estab. % 0  Immature Grans (Abs)     0.0 - 0.1 x10E3/uL 0.0  Glucose     65 - 99 mg/dL 107 (H)  BUN     6 - 24 mg/dL 7  Creatinine     0.57 - 1.00 mg/dL 0.89  GFR, Est Non African American     >59 mL/min/1.73 74  GFR, Est African American     >59 mL/min/1.73 85  BUN/Creatinine Ratio     9 - 23 8 (L)  Sodium     134 - 144 mmol/L 136  Potassium     3.5 - 5.2 mmol/L 4.1  Chloride     96 - 106 mmol/L 99  CO2     20 - 29 mmol/L 23  Calcium     8.7 - 10.2 mg/dL 9.3  Total Protein     6.0 - 8.5 g/dL 6.4  Albumin     3.8 - 4.9 g/dL 4.2  Globulin, Total     1.5 - 4.5 g/dL 2.2  Albumin/Globulin Ratio     1.2 - 2.2 1.9  Total Bilirubin     0.0 - 1.2 mg/dL 0.2  Alkaline Phosphatase     39 - 117 IU/L 75  AST     0 - 40 IU/L 19  ALT     0 - 32 IU/L 23  A.Fumigatus #1 Abs     Negative Negative  Aspergillus Flavus Antibodies     Negative Negative  Aspergillus [place]  Antibodies     Negative Negative  Aspergillus nidulans IgG     Negative Negative  Aspergillus glaucus IgG     Negative Negative  Aspergillus terreus IgG     Negative Negative  IgG (Immunoglobin G), Serum     586 - 1,602 mg/dL 624  IgA/Immunoglobulin A, Serum     87 - 352 mg/dL 100  IgM (Immunoglobulin M), Srm     26 - 217 mg/dL 190  AAT, DNA Analysis      Comment  Additional Information      Comment  Electronically Signed by:      Comment  Tetanus Ab, IgG     <0.10 IU/mL 3.78  Diphtheria Ab     <0.10 IU/mL  0.73  IgE (Immunoglobulin E), Serum     6 - 495 IU/mL 36  Compl, Total (CH50)     >41 U/mL >60    Assessment and plan: Allergic rhinitis with conjunctivitis -no significant improvement with current regimen.  She likely will have more benefit from allergen immunotherapy which has been discussed in detail today Moderate persistent asthma -symptoms are improved with management as below we will continue. Recurrent sinusitis -has not had any further antibiotic needs since the last visit  -continue avoidance of grasses, weeds, molds, mouse -continue Singulair 10 mg daily at bedtime.   -continue Xyzal 5 mg daily as needed -for itchy watery eyes recommend use of Pataday 1 drop each eye daily -for nasal drainage control use Astelin 2 sprays each nostril twice a day -continue nasal spray recommended by ENT.  Advised to call office back with name of this spray and the ENT she saw in order to request records.   -Continue Wixela 1 puff twice a day -have access to albuterol inhaler 2 puffs every 4-6 hours as needed for cough/wheeze/shortness of breath/chest tightness.  May use 15-20 minutes prior to activity.   Monitor frequency of use.   frequency of use.   -will send in Tussionex cough syrup to use every 12hrs as needed for cough -She did receive the Pneumovax on 05/30/2019   Follow-up 3-4 months or sooner if needed  I appreciate the opportunity to take part in Zelpha's care.  Please do not hesitate to contact me with questions.  Sincerely,  Paulla Dolly, MD PGY-1, Internal Medicine  Attestation:  I performed a history and physical examination of the patient and discussed management with the resident. I reviewed the resident's note and agree with the documented findings and plan of care. The note in its entirety was edited by myself, including the physical exam, assessment, and plan.   Prudy Feeler, MD [facility]

## 2019-09-14 NOTE — Patient Instructions (Addendum)
-  continue avoidance of grasses, weeds, molds, mouse -continue Singulair 10 mg daily at bedtime.   -continue Xyzal 5 mg daily as needed -for itchy watery eyes recommend use of Pataday 1 drop each eye daily -for nasal drainage control use Astelin 2 sprays each nostril twice a day -continue nasal spray recommended by ENT.  Advised to call office back with name of this spray and the ENT she saw in order to request records.   -Continue Wixela 1 puff twice a day -have access to albuterol inhaler 2 puffs every 4-6 hours as needed for cough/wheeze/shortness of breath/chest tightness.  May use 15-20 minutes prior to activity.   Monitor frequency of use.   frequency of use.   -will send in Tussionex cough syrup to use every 12hrs as needed for cough   Follow-up 3-4 months or sooner if needed

## 2019-09-15 ENCOUNTER — Telehealth (INDEPENDENT_AMBULATORY_CARE_PROVIDER_SITE_OTHER): Payer: Medicare HMO | Admitting: Psychiatry

## 2019-09-15 ENCOUNTER — Other Ambulatory Visit: Payer: Self-pay | Admitting: Internal Medicine

## 2019-09-15 ENCOUNTER — Other Ambulatory Visit: Payer: Self-pay

## 2019-09-15 DIAGNOSIS — F411 Generalized anxiety disorder: Secondary | ICD-10-CM

## 2019-09-15 DIAGNOSIS — F3131 Bipolar disorder, current episode depressed, mild: Secondary | ICD-10-CM | POA: Diagnosis not present

## 2019-09-15 MED ORDER — ALBUTEROL SULFATE HFA 108 (90 BASE) MCG/ACT IN AERS: INHALATION_SPRAY | RESPIRATORY_TRACT | 1 refills | Status: DC

## 2019-09-15 NOTE — Progress Notes (Signed)
[facility] MD/PA/NP OP Progress Note  09/15/2019 10:11 AM [name]  MRN:  [id number] Interview was conducted by phone and I verified that I was speaking with the correct person using two identifiers. I discussed the limitations of evaluation and management by telemedicine and  the availability of in person appointments. Patient expressed understanding and agreed to proceed. Patient location - home; physician - home office.  Chief Complaint: "I am doing OK".  HPI: 54yo single female withbipolar 1 disorder, most recent episode depressed,borderline personality disorder;hx of cannabis use disorderandalcohol use disorder in sustained remission.Sheis disabled.Several medciation trials in the past - does not recall all meds she had been on. Very concerned about potential weight gain on meds (gained weight on Seroquel and Depakote).At her initial visit we decided to try a combination of lurasidone 20 mg and oxacarbazepine 150 mg bid - patient wanted to start on lowest doses possible.She developed hyponatremia and dizziness and oxcarbazepine was discontinued. Zonisamide 100 mg at HS was started insteadand hyponatremia and dizziness resolved. She reports that she felt depressed in April last year;mood also improved untilSeptemberwhen she began feeling more depressed, tearful.Wehave increased dose of Latuda to 40 mg but she started to experience muscle twitching and eventually stopped taking it.Lydiahad been on Wellbutrin previously for many years with good response (off it for at least 5 years). Werestarted150 mg of XL to begin withand she reports that her mood improved. There is still some residual depression but [name] does not wish Wellbutrin dose to be increased. She reports no changes in appetite and stable weight. Sleep is adequate after we addedtrazodone(300 mg); she does not need it often.   Visit Diagnosis:    ICD-10-CM   1. Bipolar 1 disorder, depressed, mild ([place])  F31.31   2.  GAD (generalized anxiety disorder)  F41.1     Past Psychiatric History: Please see intake H&P.  Past Medical History:  Past Medical History:  Diagnosis Date  . Anxiety   . Arthritis    neck, knees, shoulders  . Asthma   . Bipolar disorder ([place])    currently feeling MANIC- 10/02/2016  . Depression   . GERD (gastroesophageal reflux disease)   . Hip fracture ([place]) 06/2019  . History of blood transfusion    as a newborn   . History of lump of left breast   . Hyperlipidemia   . Lumbar pseudoarthrosis   . Motion sickness    cars  . OSA (obstructive sleep apnea) 01/09/2016   can't afford CPAP  . Personality disorder ([place])   . PONV (postoperative nausea and vomiting)   . Post traumatic stress disorder (PTSD)   . Substance abuse ([place])   . Synovial cyst     Past Surgical History:  Procedure Laterality Date  . COLONOSCOPY WITH PROPOFOL N/A 11/04/2017   Procedure: COLONOSCOPY WITH PROPOFOL;  Surgeon: Lin Landsman, MD;  Location: [facility];  Service: Endoscopy;  Laterality: N/A;  . ESOPHAGOGASTRODUODENOSCOPY (EGD) WITH PROPOFOL N/A 11/04/2017   Procedure: ESOPHAGOGASTRODUODENOSCOPY (EGD) WITH PROPOFOL with biopsies;  Surgeon: Lin Landsman, MD;  Location: [facility];  Service: Endoscopy;  Laterality: N/A;  sleep apnea  . KNEE SURGERY Left    x5, post basketball injury  . LUMBAR LAMINECTOMY/DECOMPRESSION MICRODISCECTOMY Left 01/22/2016   Procedure: Laminectomy for facet/synovial cyst - left - Lumbar four - lumbar five;  Surgeon: Earnie Larsson, MD;  Location: [facility];  Service: Neurosurgery;  Laterality: Left;  Laminectomy for facet/synovial cyst - left - Lumbar four - lumbar  five  . POLYPECTOMY N/A 11/04/2017   Procedure: POLYPECTOMY INTESTINAL;  Surgeon: Lin Landsman, MD;  Location: [facility];  Service: Endoscopy;  Laterality: N/A;  . SHOULDER SURGERY Left    x2  . SPINE SURGERY N/A    [employer]rees[employer] 08/07/2019  . TOE SURGERY Bilateral    bone spurs     Family Psychiatric History: Reviewed.  Family History:  Family History  Problem Relation Age of Onset  . Heart disease Father   . Hyperlipidemia Father   . Alcohol abuse Brother   . Alcohol abuse Paternal Uncle   . Breast cancer Maternal Aunt        70's  . Colon cancer Neg Hx     Social History:  Social History   Socioeconomic History  . Marital status: Single    Spouse name: Not on file  . Number of children: 0  . Years of education: Not on file  . Highest education level: Bachelor's degree (e.g., BA, AB, BS)  Occupational History  . Not on file  Tobacco Use  . Smoking status: Former Smoker    Packs/day: 1.00    Years: 1.00    Pack years: 1.00    Types: Cigarettes    Quit date: 04/15/1996    Years since quitting: 23.4  . Smokeless tobacco: Never Used  . Tobacco comment: started back again in 2016, smoked for about 6 mo and then quit  Vaping Use  . Vaping Use: Never used  Substance and Sexual Activity  . Alcohol use: No    Alcohol/week: 0.0 standard drinks    Comment: quit 20 years  . Drug use: Yes    Frequency: 14.0 times per week    Types: Marijuana    Comment: 20 yrs. ago- cocaine   . Sexual activity: Not Currently  Other Topics Concern  . Not on file  Social History Narrative  . Not on file   Social Determinants of Health   Financial Resource Strain:   . Difficulty of Paying Living Expenses:   Food Insecurity:   . Worried About Charity fundraiser in the Last Year:   . Arboriculturist in the Last Year:   Transportation Needs:   . Film/video editor (Medical):   Marland Kitchen Lack of Transportation (Non-Medical):   Physical Activity:   . Days of Exercise per Week:   . Minutes of Exercise per Session:   Stress:   . Feeling of Stress :   Social Connections:   . Frequency of Communication with Friends and Family:   . Frequency of Social Gatherings with Friends and Family:   . Attends Religious Services:   . Active Member of Clubs or Organizations:    . Attends Archivist Meetings:   Marland Kitchen Marital Status:     Allergies:  Allergies  Allergen Reactions  . Other   . Effexor [Venlafaxine] Other (See Comments)    UNSPECIFIED REACTION, headaches, felt funny, withdrawal with missed dose   . Lamictal [Lamotrigine] Rash    Metabolic Disorder Labs: Lab Results  Component Value Date   HGBA1C 5.6 06/22/2018   No results found for: PROLACTIN Lab Results  Component Value Date   CHOL 168 08/10/2019   TRIG 103 08/10/2019   HDL 71 08/10/2019   CHOLHDL 2.4 08/10/2019   VLDL 22.2 06/22/2018   LDLCALC 79 08/10/2019   LDLCALC 79 06/22/2018   Lab Results  Component Value Date   TSH 1.30 03/15/2015    Therapeutic  Level Labs: No results found for: LITHIUM No results found for: VALPROATE No components found for:  CBMZ  Current Medications: Current Outpatient Medications  Medication Sig Dispense Refill  . albuterol (VENTOLIN HFA) 108 (90 Base) MCG/ACT inhaler TAKE 2 PUFFS BY MOUTH EVERY 6 HOURS AS NEEDED FOR WHEEZE OR SHORTNESS OF BREATH 8 g 1  . aspirin 81 MG tablet Take 81 mg by mouth daily.    Marland Kitchen azelastine (ASTELIN) 0.1 % nasal spray Place 2 sprays into both nostrils 2 (two) times daily. 30 mL 5  . buPROPion (WELLBUTRIN XL) 150 MG 24 hr tablet Take 1 tablet (150 mg total) by mouth every morning. 90 tablet 1  . calcium-vitamin D (OSCAL WITH D) 500-200 MG-UNIT tablet Take 1 tablet by mouth 3 (three) times daily. 90 tablet 6  . chlorpheniramine-HYDROcodone (TUSSIONEX PENNKINETIC ER) 10-8 MG/5ML SUER Take 5 mLs by mouth every 12 (twelve) hours as needed for up to 14 days for cough. 140 mL 0  . diphenoxylate-atropine (LOMOTIL) 2.5-0.025 MG tablet Take 1 tablet by mouth daily as needed for diarrhea or loose stools. 30 tablet 0  . Fluticasone-Salmeterol (WIXELA INHUB) 100-50 MCG/DOSE AEPB Inhale 1 puff into the lungs in the morning and at bedtime. MUST SCHEDULE PHYSICAL 60 each 5  . gabapentin (NEURONTIN) 300 MG capsule Take 1 capsule  by mouth 3 (three) times daily.    . meloxicam (MOBIC) 7.5 MG tablet Take 1 tablet (7.5 mg total) by mouth 2 (two) times daily as needed for pain. 30 tablet 2  . montelukast (SINGULAIR) 10 MG tablet Take 1 tablet (10 mg total) by mouth at bedtime. 30 tablet 5  . morphine (MS CONTIN) 15 MG 12 hr tablet Take 1 tablet by mouth 2 (two) times daily.     . Olopatadine HCl 0.2 % SOLN PLACE 1 DROP INTO BOTH EYES DAILY AS NEEDED. 7.5 mL 1  . simvastatin (ZOCOR) 20 MG tablet TAKE 1 TABLET (20 MG TOTAL) BY MOUTH DAILY AT 6 PM. SCHEDULE PHYSICAL 90 tablet 0  . tiZANidine (ZANAFLEX) 4 MG capsule Take 4 mg by mouth 3 (three) times daily.    Marland Kitchen tiZANidine (ZANAFLEX) 4 MG tablet     . trazodone (DESYREL) 300 MG tablet Take 1 tablet (300 mg total) by mouth at bedtime as needed for sleep. 30 tablet 2  . zonisamide (ZONEGRAN) 100 MG capsule Take 1 capsule (100 mg total) by mouth at bedtime. 90 capsule 1   No current facility-administered medications for this visit.    Psychiatric Specialty Exam: Review of Systems  Musculoskeletal: Positive for back pain.  All other systems reviewed and are negative.   There were no vitals taken for this visit.There is no height or weight on file to calculate BMI.  General Appearance: NA  Eye Contact:  NA  Speech:  Clear and Coherent and Normal Rate  Volume:  Normal  Mood:  Some depression  Affect:  NA  Thought Process:  Goal Directed and Linear  Orientation:  Full (Time, Place, and Person)  Thought Content: Logical   Suicidal Thoughts:  No  Homicidal Thoughts:  No  Memory:  Immediate;   Good Recent;   Good Remote;   Good  Judgement:  Good  Insight:  Fair  Psychomotor Activity:  NA  Concentration:  Concentration: Good  Recall:  Good  Fund of Knowledge: Good  Language: Good  Akathisia:  Negative  Handed:  Right  AIMS (if indicated): not done  Assets:  Communication Skills Desire for Improvement  Financial Resources/Insurance Housing  ADL's:  Intact   Cognition: WNL  Sleep:  Fair   Screenings: PHQ2-9     Office Visit from 06/22/2018 in [place] at [facility] Visit from 05/21/2017 in [place] at [facility] Visit from 09/12/2015 in [place] at [facility] Visit from 09/12/2014 in [place] at [facility]  PHQ-2 Total Score 0 0 0 1       Assessment and Plan:  54yo single female withbipolar 1 disorder, most recent episode depressed,borderline personality disorder;hx of cannabis use disorderandalcohol use disorder in sustained remission.Sheis disabled.Several medciation trials in the past - does not recall all meds she had been on. Very concerned about potential weight gain on meds (gained weight on Seroquel and Depakote).At her initial visit we decided to try a combination of lurasidone 20 mg and oxacarbazepine 150 mg bid - patient wanted to start on lowest doses possible.She developed hyponatremia and dizziness and oxcarbazepine was discontinued. Zonisamide 100 mg at HS was started insteadand hyponatremia and dizziness resolved. She reports that she felt depressed in April last year;mood also improved untilSeptemberwhen she began feeling more depressed, tearful.Wehave increased dose of Latuda to 40 mg but she started to experience muscle twitching and eventually stopped taking it.Lydiahad been on Wellbutrin previously for many years with good response (off it for at least 5 years). Werestarted150 mg of XL to begin withand she reports that her mood improved. There is still some residual depression but Keymora does not wish Wellbutrin dose to be increased. She reports no changes in appetite and stable weight. Sleep is adequate after we addedtrazodone(300 mg); she does not need it often.  Dx; Bipolar 1 disorder depressed mild  Plan:We continuebupropion,trazodone and zonisamide unchanged.Shewill return to clinic inthreemonths. The plan was discussed with  patient who had an opportunity to ask questions and these were all answered. I spend15 minutes inphone consultation with the patient.    Stephanie Acre, MD 09/15/2019, 10:11 AM

## 2019-10-05 ENCOUNTER — Other Ambulatory Visit: Payer: Self-pay | Admitting: Allergy

## 2019-10-10 DIAGNOSIS — L820 Inflamed seborrheic keratosis: Secondary | ICD-10-CM | POA: Diagnosis not present

## 2019-10-10 DIAGNOSIS — C44622 Squamous cell carcinoma of skin of right upper limb, including shoulder: Secondary | ICD-10-CM | POA: Diagnosis not present

## 2019-10-11 DIAGNOSIS — Z6825 Body mass index (BMI) 25.0-25.9, adult: Secondary | ICD-10-CM | POA: Diagnosis not present

## 2019-10-11 DIAGNOSIS — M4317 Spondylolisthesis, lumbosacral region: Secondary | ICD-10-CM | POA: Diagnosis not present

## 2019-10-11 DIAGNOSIS — R030 Elevated blood-pressure reading, without diagnosis of hypertension: Secondary | ICD-10-CM | POA: Diagnosis not present

## 2019-10-20 DIAGNOSIS — M25552 Pain in left hip: Secondary | ICD-10-CM | POA: Diagnosis not present

## 2019-10-20 DIAGNOSIS — M25551 Pain in right hip: Secondary | ICD-10-CM | POA: Diagnosis not present

## 2019-11-08 DIAGNOSIS — M5412 Radiculopathy, cervical region: Secondary | ICD-10-CM | POA: Diagnosis not present

## 2019-11-08 DIAGNOSIS — M5023 Other cervical disc displacement, cervicothoracic region: Secondary | ICD-10-CM | POA: Diagnosis not present

## 2019-11-08 DIAGNOSIS — M4802 Spinal stenosis, cervical region: Secondary | ICD-10-CM | POA: Diagnosis not present

## 2019-11-13 ENCOUNTER — Other Ambulatory Visit: Payer: Self-pay | Admitting: Internal Medicine

## 2019-11-17 DIAGNOSIS — M7711 Lateral epicondylitis, right elbow: Secondary | ICD-10-CM | POA: Diagnosis not present

## 2019-11-21 DIAGNOSIS — Z85828 Personal history of other malignant neoplasm of skin: Secondary | ICD-10-CM | POA: Diagnosis not present

## 2019-11-21 DIAGNOSIS — Z08 Encounter for follow-up examination after completed treatment for malignant neoplasm: Secondary | ICD-10-CM | POA: Diagnosis not present

## 2019-11-23 ENCOUNTER — Telehealth: Payer: Self-pay | Admitting: Internal Medicine

## 2019-11-23 DIAGNOSIS — M5412 Radiculopathy, cervical region: Secondary | ICD-10-CM | POA: Diagnosis not present

## 2019-11-23 NOTE — Telephone Encounter (Signed)
Pt wants second doctor to look at her for surgery on neck

## 2019-11-23 NOTE — Telephone Encounter (Signed)
I do not see anything recently in the chart regarding patient's neck. Can you clarify with the patient  what she wants re-evaluated and is there someone in particular that she would like to see?

## 2019-11-24 NOTE — Telephone Encounter (Signed)
Called the pt and lvm for her to call the office back so I can get more information

## 2019-11-25 ENCOUNTER — Telehealth (INDEPENDENT_AMBULATORY_CARE_PROVIDER_SITE_OTHER): Payer: Medicare HMO | Admitting: Family Medicine

## 2019-11-25 DIAGNOSIS — M47812 Spondylosis without myelopathy or radiculopathy, cervical region: Secondary | ICD-10-CM

## 2019-11-25 DIAGNOSIS — J209 Acute bronchitis, unspecified: Secondary | ICD-10-CM

## 2019-11-25 MED ORDER — PROMETHAZINE-CODEINE 6.25-10 MG/5ML PO SYRP: 5.0000 mL | ORAL_SOLUTION | Freq: Every day | ORAL | 0 refills | Status: DC

## 2019-11-25 MED ORDER — PREDNISONE 20 MG PO TABS
40.0000 mg | ORAL_TABLET | Freq: Every day | ORAL | 0 refills | Status: AC
Start: 2019-11-25 — End: 2019-11-30

## 2019-11-25 MED ORDER — BENZONATATE 100 MG PO CAPS: 100.0000 mg | ORAL_CAPSULE | Freq: Three times a day (TID) | ORAL | 0 refills | Status: DC | PRN

## 2019-11-25 NOTE — Progress Notes (Signed)
Virtual Visit via Telephone Note  I connected with [name] on 11/25/19 at  9:30 AM EDT by telephone and verified that I am speaking with the correct person using two identifiers.  Location: Patient: Home Provider: Molli Barrows, FNP   I discussed the limitations, risks, security and privacy concerns of performing an evaluation and management service by telephone and the availability of in person appointments. I also discussed with the patient that there may be a patient responsible charge related to this service. The patient expressed understanding and agreed to proceed.   History of Present Illness: Surgical Consultation, Second Opinion: [name], 54, female, is currently under the care of Dr. Earnie Larsson at [place]. She reports that he has advised her that she is in need of cervical spine surgery to correct chronic degenerative changes which is resulting in recurrent pain. Patient has chronic back pain related to spondylolisthesis of lumbosacral. Unfortunately, Dr. Marchelle Folks documentation is not accessible as his office utilizes a different EMR. Patient reports not wanting to have surgery if other options are available and requests a referral.   Acute Bronchitis  Cough, wheezing, and shortness of breath. Patient has chronic asthma and is daily smoker.Patient has history of recurrent sinusitis and recurrent bronchitis. She has taken OTC medications without relief of symptoms. Current symptoms have been present for more than 1 week. Denies any concern for possible COVID exposure.   Observations/Objective: Patient is speaking clearly and full complete sentences. Cough audible during encounter. No audible wheezing or increase effort with breathing audible during call  Assessment and Plan: 1. Acute bronchitis, unspecified organism, recurrent, active smoker with chronic asthma  -Prednisone 40 mg x 5 days -Benzonatate 100-200 mg TID as needed -Promethazine with  Codeine QHS only patient prescribed chronic opioids for back pain,therefore a small amount of cough syrup with codeine supplied for night time cough only    2. Osteoarthritis of cervical spine, unspecified spinal osteoarthritis complication status, second opinion to evaluate treatment vs surgical intervention  - Ambulatory referral to Orthopedic Surgery   Follow Up Instructions: Follow-up if symptoms worsen or do not improve. Referral to [facility],Dr. Joyice Faster Dimmig, pending    I discussed the assessment and treatment plan with the patient. The patient was provided an opportunity to ask questions and all were answered. The patient agreed with the plan and demonstrated an understanding of the instructions.   The patient was advised to call back or seek an in-person evaluation if the symptoms worsen or if the condition fails to improve as anticipated.  I provided 30 minutes of non-face-to-face time during this encounter.    Molli Barrows, FNP Primary Care at [facility] [address] [place] [phone]: [phone]

## 2019-12-06 ENCOUNTER — Telehealth (INDEPENDENT_AMBULATORY_CARE_PROVIDER_SITE_OTHER): Payer: Medicare HMO | Admitting: Internal Medicine

## 2019-12-06 ENCOUNTER — Encounter: Payer: Self-pay | Admitting: Internal Medicine

## 2019-12-06 DIAGNOSIS — J029 Acute pharyngitis, unspecified: Secondary | ICD-10-CM

## 2019-12-06 DIAGNOSIS — R059 Cough, unspecified: Secondary | ICD-10-CM

## 2019-12-06 DIAGNOSIS — R0981 Nasal congestion: Secondary | ICD-10-CM | POA: Diagnosis not present

## 2019-12-06 MED ORDER — AMOXICILLIN-POT CLAVULANATE 875-125 MG PO TABS: 1.0000 | ORAL_TABLET | Freq: Two times a day (BID) | ORAL | 0 refills | Status: DC

## 2019-12-06 MED ORDER — BENZONATATE 100 MG PO CAPS: 100.0000 mg | ORAL_CAPSULE | Freq: Three times a day (TID) | ORAL | 0 refills | Status: DC | PRN

## 2019-12-06 MED ORDER — PROMETHAZINE-CODEINE 6.25-10 MG/5ML PO SYRP: 5.0000 mL | ORAL_SOLUTION | Freq: Every day | ORAL | 0 refills | Status: DC

## 2019-12-06 NOTE — Progress Notes (Signed)
Virtual Visit via Telephone Note  I connected with [name], on 12/06/2019 at 1:49 PM by telephone due to the COVID-19 pandemic and verified that I am speaking with the correct person using two identifiers.   Consent: I discussed the limitations, risks, security and privacy concerns of performing an evaluation and management service by telephone and the availability of in person appointments. I also discussed with the patient that there may be a patient responsible charge related to this service. The patient expressed understanding and agreed to proceed.   Location of Patient: Home   Location of Provider: Clinic    Persons participating in Telemedicine visit: [facility] Dr. Juleen China      History of Present Illness: Patient has a visit for acute concerns regarding acute bronchitis. Symptoms initially started 9/20. Patient was seen on 9/24 and treated with Prednisone, Benzonatate, Promethazine with Codeine QHS. Patient reports that symptoms did not improve with this treatment. She has additionally developed sore throat and sinus pressure. This started about 3-4 days ago. Afebrile. Breathing is fine with some minimal wheezing and cough. Has a history of Albuterol--needed to use it 2x this past week.Took at home rapid COVID test that was negative.    Past Medical History:  Diagnosis Date  . Anxiety   . Arthritis    neck, knees, shoulders  . Asthma   . Bipolar disorder ([place])    currently feeling MANIC- 10/02/2016  . Depression   . GERD (gastroesophageal reflux disease)   . Hip fracture ([place]) 06/2019  . History of blood transfusion    as a newborn   . History of lump of left breast   . Hyperlipidemia   . Lumbar pseudoarthrosis   . Motion sickness    cars  . OSA (obstructive sleep apnea) 01/09/2016   can't afford CPAP  . Personality disorder ([place])   . PONV (postoperative nausea and vomiting)   . Post traumatic stress disorder (PTSD)   . Substance abuse  ([place])   . Synovial cyst    Allergies  Allergen Reactions  . Other   . Effexor [Venlafaxine] Other (See Comments)    UNSPECIFIED REACTION, headaches, felt funny, withdrawal with missed dose   . Lamictal [Lamotrigine] Rash    Current Outpatient Medications on File Prior to Visit  Medication Sig Dispense Refill  . albuterol (VENTOLIN HFA) 108 (90 Base) MCG/ACT inhaler 2 puffs every 4-6 hours as needed 18 g 1  . aspirin 81 MG tablet Take 81 mg by mouth daily.    Marland Kitchen azelastine (ASTELIN) 0.1 % nasal spray PLACE 2 SPRAYS INTO BOTH NOSTRILS 2 (TWO) TIMES DAILY. 30 mL 5  . benzonatate (TESSALON) 100 MG capsule Take 1-2 capsules (100-200 mg total) by mouth 3 (three) times daily as needed for cough. 40 capsule 0  . buPROPion (WELLBUTRIN XL) 150 MG 24 hr tablet Take 1 tablet (150 mg total) by mouth every morning. 90 tablet 1  . calcium-vitamin D (OSCAL WITH D) 500-200 MG-UNIT tablet Take 1 tablet by mouth 3 (three) times daily. 90 tablet 6  . diphenoxylate-atropine (LOMOTIL) 2.5-0.025 MG tablet Take 1 tablet by mouth daily as needed for diarrhea or loose stools. 30 tablet 0  . Fluticasone-Salmeterol (WIXELA INHUB) 100-50 MCG/DOSE AEPB Inhale 1 puff into the lungs in the morning and at bedtime. MUST SCHEDULE PHYSICAL 60 each 5  . gabapentin (NEURONTIN) 300 MG capsule Take 1 capsule by mouth 3 (three) times daily.    . meloxicam (MOBIC) 7.5 MG  tablet Take 1 tablet (7.5 mg total) by mouth 2 (two) times daily as needed for pain. 30 tablet 2  . montelukast (SINGULAIR) 10 MG tablet Take 1 tablet (10 mg total) by mouth at bedtime. 30 tablet 5  . morphine (MS CONTIN) 15 MG 12 hr tablet Take 1 tablet by mouth 2 (two) times daily.     . Olopatadine HCl 0.2 % SOLN PLACE 1 DROP INTO BOTH EYES DAILY AS NEEDED. 7.5 mL 1  . promethazine-codeine (PHENERGAN WITH CODEINE) 6.25-10 MG/5ML syrup Take 5 mLs by mouth at bedtime. 60 mL 0  . simvastatin (ZOCOR) 20 MG tablet TAKE 1 TABLET (20 MG TOTAL) BY MOUTH DAILY AT 6 PM.  SCHEDULE PHYSICAL 90 tablet 0  . tiZANidine (ZANAFLEX) 4 MG tablet     . trazodone (DESYREL) 300 MG tablet Take 1 tablet (300 mg total) by mouth at bedtime as needed for sleep. 30 tablet 2  . triamcinolone (NASACORT) 55 MCG/ACT AERO nasal inhaler     . zonisamide (ZONEGRAN) 100 MG capsule Take 1 capsule (100 mg total) by mouth at bedtime. 90 capsule 1   No current facility-administered medications on file prior to visit.    Observations/Objective: NAD. Speaking clearly.  Work of breathing normal.  Alert and oriented. Mood appropriate.   Assessment and Plan: 1. Cough 2. Sore throat 3. Sinus congestion Treating for presumed secondary bacterial sinusitis with Augmentin. Refill for cough suppressants provided. Respiratory status sounds very stable and will not refill Prednisone. Have advised patient to have COVID PCR test done for more accurate results. Supportive care and reasons to seek emergency care discussed.  - benzonatate (TESSALON) 100 MG capsule; Take 1-2 capsules (100-200 mg total) by mouth 3 (three) times daily as needed for cough.  Dispense: 40 capsule; Refill: 0 - promethazine-codeine (PHENERGAN WITH CODEINE) 6.25-10 MG/5ML syrup; Take 5 mLs by mouth at bedtime.  Dispense: 60 mL; Refill: 0 - amoxicillin-clavulanate (AUGMENTIN) 875-125 MG tablet; Take 1 tablet by mouth 2 (two) times daily.  Dispense: 20 tablet; Refill: 0   Follow Up Instructions: PRN and for routine medical care    I discussed the assessment and treatment plan with the patient. The patient was provided an opportunity to ask questions and all were answered. The patient agreed with the plan and demonstrated an understanding of the instructions.   The patient was advised to call back or seek an in-person evaluation if the symptoms worsen or if the condition fails to improve as anticipated.     I provided 14 minutes total of non-face-to-face time during this encounter including median intraservice time, reviewing  previous notes, investigations, ordering medications, medical decision making, coordinating care and patient verbalized understanding at the end of the visit.    Phill Myron, D.O. Primary Care at [facility]  12/06/2019, 1:49 PM

## 2019-12-07 ENCOUNTER — Other Ambulatory Visit (HOSPITAL_COMMUNITY): Payer: Self-pay | Admitting: Psychiatry

## 2019-12-13 ENCOUNTER — Telehealth (INDEPENDENT_AMBULATORY_CARE_PROVIDER_SITE_OTHER): Payer: Medicare HMO | Admitting: Psychiatry

## 2019-12-13 ENCOUNTER — Other Ambulatory Visit: Payer: Self-pay

## 2019-12-13 DIAGNOSIS — F3131 Bipolar disorder, current episode depressed, mild: Secondary | ICD-10-CM | POA: Diagnosis not present

## 2019-12-13 MED ORDER — BUPROPION HCL ER (XL) 300 MG PO TB24: 300.0000 mg | ORAL_TABLET | Freq: Every day | ORAL | 1 refills | Status: DC

## 2019-12-13 MED ORDER — ZONISAMIDE 100 MG PO CAPS: 100.0000 mg | ORAL_CAPSULE | Freq: Every day | ORAL | 1 refills | Status: DC

## 2019-12-13 NOTE — Progress Notes (Signed)
BH MD/PA/NP OP Progress Note  12/13/2019 10:09 AM [name]  MRN:  [id number] Interview was conducted by phone and I verified that I was speaking with the correct person using two identifiers. I discussed the limitations of evaluation and management by telemedicine and  the availability of in person appointments. Patient expressed understanding and agreed to proceed. Patient location - home; physician - home office.  Chief Complaint: Some depression, back pain.  HPI: 54yo single female withbipolar 1 disorder, most recent episode depressed,borderline personality disorder;hx of cannabis use disorderandalcohol use disorder in sustained remission.Sheis disabled.Several medication trials in the past - does not recall all meds she had been on. Very concerned about potential weight gain on meds (gained weight on Seroquel and Depakote).At her initial visit we decided to try a combination of lurasidone 20 mg and oxacarbazepine 150 mg bid - patient wanted to start on lowest doses possible.She developed hyponatremia and dizziness and oxcarbazepine was discontinued. Zonisamide 100 mg at HS was started insteadand hyponatremia and dizziness resolved. She reports that she felt depressed in [place] year;mood also improved untilSeptemberwhen she began feeling more depressed, tearful.Wehave increased dose of Latuda to 40 mg but she started to experience muscle twitching and eventually stopped taking it.Lydiahad been on Wellbutrin previously for many years with good response (off it for at least 5 years). Werestarted150 mg of XL to begin withand she reports that her mood improved.There is still some residual depression and now [name] is open to trying Wellbutrin dose to be increased.She reports no changes in appetite and stable weight. Sleep is adequate after we addedtrazodone(300 mg); she rarely uses it.     ICD-10-CM   1. Bipolar 1 disorder, depressed, mild (HCC)  F31.31     Past  Psychiatric History: Please see intake H&P.  Past Medical History:  Past Medical History:  Diagnosis Date  . Anxiety   . Arthritis    neck, knees, shoulders  . Asthma   . Bipolar disorder ([place])    currently feeling MANIC- 10/02/2016  . Depression   . GERD (gastroesophageal reflux disease)   . Hip fracture ([place]) 06/2019  . History of blood transfusion    as a newborn   . History of lump of left breast   . Hyperlipidemia   . Lumbar pseudoarthrosis   . Motion sickness    cars  . OSA (obstructive sleep apnea) 01/09/2016   can't afford CPAP  . Personality disorder ([place])   . PONV (postoperative nausea and vomiting)   . Post traumatic stress disorder (PTSD)   . Substance abuse ([place])   . Synovial cyst     Past Surgical History:  Procedure Laterality Date  . COLONOSCOPY WITH PROPOFOL N/A 11/04/2017   Procedure: COLONOSCOPY WITH PROPOFOL;  Surgeon: Lin Landsman, MD;  Location: [place];  Service: Endoscopy;  Laterality: N/A;  . ESOPHAGOGASTRODUODENOSCOPY (EGD) WITH PROPOFOL N/A 11/04/2017   Procedure: ESOPHAGOGASTRODUODENOSCOPY (EGD) WITH PROPOFOL with biopsies;  Surgeon: Lin Landsman, MD;  Location: [place];  Service: Endoscopy;  Laterality: N/A;  sleep apnea  . KNEE SURGERY Left    x5, post basketball injury  . LUMBAR LAMINECTOMY/DECOMPRESSION MICRODISCECTOMY Left 01/22/2016   Procedure: Laminectomy for facet/synovial cyst - left - Lumbar four - lumbar five;  Surgeon: Earnie Larsson, MD;  Location: [place];  Service: Neurosurgery;  Laterality: Left;  Laminectomy for facet/synovial cyst - left - Lumbar four - lumbar five  . POLYPECTOMY N/A 11/04/2017   Procedure: POLYPECTOMY INTESTINAL;  Surgeon: Sherri Sear  Reece Levy, MD;  Location: [place];  Service: Endoscopy;  Laterality: N/A;  . SHOULDER SURGERY Left    x2  . SPINE SURGERY N/A    Phreesia 08/07/2019  . TOE SURGERY Bilateral    bone spurs    Family Psychiatric History: Reviewed.  Family  History:  Family History  Problem Relation Age of Onset  . Heart disease Father   . Hyperlipidemia Father   . Alcohol abuse Brother   . Alcohol abuse Paternal Uncle   . Breast cancer Maternal Aunt        70's  . Colon cancer Neg Hx     Social History:  Social History   Socioeconomic History  . Marital status: Single    Spouse name: Not on file  . Number of children: 0  . Years of education: Not on file  . Highest education level: Bachelor's degree (e.g., BA, AB, BS)  Occupational History  . Not on file  Tobacco Use  . Smoking status: Former Smoker    Packs/day: 1.00    Years: 1.00    Pack years: 1.00    Types: Cigarettes    Quit date: 04/15/1996    Years since quitting: 23.6  . Smokeless tobacco: Never Used  . Tobacco comment: started back again in 2016, smoked for about 6 mo and then quit  Vaping Use  . Vaping Use: Never used  Substance and Sexual Activity  . Alcohol use: No    Alcohol/week: 0.0 standard drinks    Comment: quit 20 years  . Drug use: Yes    Frequency: 14.0 times per week    Types: Marijuana    Comment: 20 yrs. ago- cocaine   . Sexual activity: Not Currently  Other Topics Concern  . Not on file  Social History Narrative  . Not on file   Social Determinants of Health   Financial Resource Strain:   . Difficulty of Paying Living Expenses: Not on file  Food Insecurity:   . Worried About Charity fundraiser in the Last Year: Not on file  . Ran Out of Food in the Last Year: Not on file  Transportation Needs:   . Lack of Transportation (Medical): Not on file  . Lack of Transportation (Non-Medical): Not on file  Physical Activity:   . Days of Exercise per Week: Not on file  . Minutes of Exercise per Session: Not on file  Stress:   . Feeling of Stress : Not on file  Social Connections:   . Frequency of Communication with Friends and Family: Not on file  . Frequency of Social Gatherings with Friends and Family: Not on file  . Attends Religious  Services: Not on file  . Active Member of Clubs or Organizations: Not on file  . Attends Archivist Meetings: Not on file  . Marital Status: Not on file    Allergies:  Allergies  Allergen Reactions  . Other   . Effexor [Venlafaxine] Other (See Comments)    UNSPECIFIED REACTION, headaches, felt funny, withdrawal with missed dose   . Lamictal [Lamotrigine] Rash    Metabolic Disorder Labs: Lab Results  Component Value Date   HGBA1C 5.6 06/22/2018   No results found for: PROLACTIN Lab Results  Component Value Date   CHOL 168 08/10/2019   TRIG 103 08/10/2019   HDL 71 08/10/2019   CHOLHDL 2.4 08/10/2019   VLDL 22.2 06/22/2018   LDLCALC 79 08/10/2019   LDLCALC 79 06/22/2018   Lab Results  Component Value Date   TSH 1.30 03/15/2015    Therapeutic Level Labs: No results found for: LITHIUM No results found for: VALPROATE No components found for:  CBMZ  Current Medications: Current Outpatient Medications  Medication Sig Dispense Refill  . albuterol (VENTOLIN HFA) 108 (90 Base) MCG/ACT inhaler 2 puffs every 4-6 hours as needed 18 g 1  . amoxicillin-clavulanate (AUGMENTIN) 875-125 MG tablet Take 1 tablet by mouth 2 (two) times daily. 20 tablet 0  . aspirin 81 MG tablet Take 81 mg by mouth daily.    Marland Kitchen azelastine (ASTELIN) 0.1 % nasal spray PLACE 2 SPRAYS INTO BOTH NOSTRILS 2 (TWO) TIMES DAILY. 30 mL 5  . benzonatate (TESSALON) 100 MG capsule Take 1-2 capsules (100-200 mg total) by mouth 3 (three) times daily as needed for cough. 40 capsule 0  . buPROPion (WELLBUTRIN XL) 300 MG 24 hr tablet Take 1 tablet (300 mg total) by mouth daily. 90 tablet 1  . calcium-vitamin D (OSCAL WITH D) 500-200 MG-UNIT tablet Take 1 tablet by mouth 3 (three) times daily. 90 tablet 6  . diphenoxylate-atropine (LOMOTIL) 2.5-0.025 MG tablet Take 1 tablet by mouth daily as needed for diarrhea or loose stools. 30 tablet 0  . Fluticasone-Salmeterol (WIXELA INHUB) 100-50 MCG/DOSE AEPB Inhale 1 puff  into the lungs in the morning and at bedtime. MUST SCHEDULE PHYSICAL 60 each 5  . gabapentin (NEURONTIN) 300 MG capsule Take 1 capsule by mouth 3 (three) times daily.    . meloxicam (MOBIC) 7.5 MG tablet Take 1 tablet (7.5 mg total) by mouth 2 (two) times daily as needed for pain. 30 tablet 2  . montelukast (SINGULAIR) 10 MG tablet Take 1 tablet (10 mg total) by mouth at bedtime. 30 tablet 5  . morphine (MS CONTIN) 15 MG 12 hr tablet Take 1 tablet by mouth 2 (two) times daily.     . Olopatadine HCl 0.2 % SOLN PLACE 1 DROP INTO BOTH EYES DAILY AS NEEDED. 7.5 mL 1  . promethazine-codeine (PHENERGAN WITH CODEINE) 6.25-10 MG/5ML syrup Take 5 mLs by mouth at bedtime. 60 mL 0  . simvastatin (ZOCOR) 20 MG tablet TAKE 1 TABLET (20 MG TOTAL) BY MOUTH DAILY AT 6 PM. SCHEDULE PHYSICAL 90 tablet 0  . tiZANidine (ZANAFLEX) 4 MG tablet     . trazodone (DESYREL) 300 MG tablet Take 1 tablet (300 mg total) by mouth at bedtime as needed for sleep. 30 tablet 2  . triamcinolone (NASACORT) 55 MCG/ACT AERO nasal inhaler     . zonisamide (ZONEGRAN) 100 MG capsule Take 1 capsule (100 mg total) by mouth at bedtime. 90 capsule 1   No current facility-administered medications for this visit.    Psychiatric Specialty Exam: Review of Systems  Musculoskeletal: Positive for back pain.  All other systems reviewed and are negative.   There were no vitals taken for this visit.There is no height or weight on file to calculate BMI.  General Appearance: NA  Eye Contact:  NA  Speech:  Clear and Coherent and Normal Rate  Volume:  Normal  Mood:  Depressed  Affect:  NA  Thought Process:  Goal Directed  Orientation:  Full (Time, Place, and Person)  Thought Content: Logical   Suicidal Thoughts:  No  Homicidal Thoughts:  No  Memory:  Immediate;   Good Recent;   Good Remote;   Good  Judgement:  Good  Insight:  Good  Psychomotor Activity:  NA  Concentration:  Concentration: Good  Recall:  Good  Fund of  Knowledge: Good   Language: Good  Akathisia:  Negative  Handed:  Right  AIMS (if indicated): not done  Assets:  Communication Skills Desire for Improvement Housing Resilience  ADL's:  Intact  Cognition: WNL  Sleep:  Fair   Screenings: PHQ2-9     Office Visit from 06/22/2018 in [place] at [facility] Visit from 05/21/2017 in [place] at [facility] Visit from 09/12/2015 in [place] at [place] from 09/12/2014 in [place] at [facility] Total Score 0 0 0 1       Assessment and Plan: 54yo single female withbipolar 1 disorder, most recent episode depressed,borderline personality disorder;hx of cannabis use disorderandalcohol use disorder in sustained remission.Sheis disabled.Several medication trials in the past - does not recall all meds she had been on. Very concerned about potential weight gain on meds (gained weight on Seroquel and Depakote).At her initial visit we decided to try a combination of lurasidone 20 mg and oxacarbazepine 150 mg bid - patient wanted to start on lowest doses possible.She developed hyponatremia and dizziness and oxcarbazepine was discontinued. Zonisamide 100 mg at HS was started insteadand hyponatremia and dizziness resolved. She reports that she felt depressed in [place] year;mood also improved untilSeptemberwhen she began feeling more depressed, tearful.Wehave increased dose of Latuda to 40 mg but she started to experience muscle twitching and eventually stopped taking it.Lydiahad been on Wellbutrin previously for many years with good response (off it for at least 5 years). Werestarted150 mg of XL to begin withand she reports that her mood improved.There is still some residual depression and now [name] is open to trying Wellbutrin dose to be increased.She reports no changes in appetite and stable weight. Sleep is adequate after we addedtrazodone(300 mg); she rarely uses  it.  Dx; Bipolar 1 disorder depressed mild  Plan:We continuebupropion but at a higher 300 mg dose,trazodone and zonisamide unchanged.Shewill return to clinic inthreemonths. The plan was discussed with patient who had an opportunity to ask questions and these were all answered. I spend50minutes inphone consultation with the patient   Stephanie Acre, MD 12/13/2019, 10:09 AM

## 2019-12-14 DIAGNOSIS — M4317 Spondylolisthesis, lumbosacral region: Secondary | ICD-10-CM | POA: Diagnosis not present

## 2020-01-12 DIAGNOSIS — M7711 Lateral epicondylitis, right elbow: Secondary | ICD-10-CM | POA: Diagnosis not present

## 2020-01-12 DIAGNOSIS — M7712 Lateral epicondylitis, left elbow: Secondary | ICD-10-CM | POA: Diagnosis not present

## 2020-01-18 DIAGNOSIS — M4317 Spondylolisthesis, lumbosacral region: Secondary | ICD-10-CM | POA: Diagnosis not present

## 2020-02-04 ENCOUNTER — Other Ambulatory Visit: Payer: Self-pay | Admitting: Family Medicine

## 2020-02-04 NOTE — Telephone Encounter (Signed)
Rerouted to P[facility] at [facility]

## 2020-03-01 ENCOUNTER — Telehealth: Payer: Self-pay | Admitting: Internal Medicine

## 2020-03-01 NOTE — Telephone Encounter (Signed)
Pt requesting refill of promethazine-codeine (PHENERGAN WITH CODEINE) 6.25-10 MG/5ML syrup   [id number] - [place], [place] - [address] (Ph: [phone])  Please advise and thank you

## 2020-03-01 NOTE — Telephone Encounter (Signed)
Refill not appropriate---this was prescribed on 10/5 for acute illness.   Marcy Siren, D.O. Primary Care at [facility]  03/01/2020, 3:56 PM

## 2020-03-10 ENCOUNTER — Other Ambulatory Visit: Payer: Self-pay | Admitting: Orthopaedic Surgery

## 2020-03-10 ENCOUNTER — Other Ambulatory Visit: Payer: Self-pay | Admitting: Allergy

## 2020-03-11 ENCOUNTER — Other Ambulatory Visit: Payer: Self-pay | Admitting: Allergy

## 2020-03-12 DIAGNOSIS — M7712 Lateral epicondylitis, left elbow: Secondary | ICD-10-CM | POA: Diagnosis not present

## 2020-03-12 DIAGNOSIS — M7711 Lateral epicondylitis, right elbow: Secondary | ICD-10-CM | POA: Diagnosis not present

## 2020-03-15 ENCOUNTER — Telehealth (INDEPENDENT_AMBULATORY_CARE_PROVIDER_SITE_OTHER): Payer: Medicare HMO | Admitting: Psychiatry

## 2020-03-15 ENCOUNTER — Other Ambulatory Visit: Payer: Self-pay | Admitting: Orthopedic Surgery

## 2020-03-15 ENCOUNTER — Other Ambulatory Visit: Payer: Self-pay

## 2020-03-15 DIAGNOSIS — M25522 Pain in left elbow: Secondary | ICD-10-CM

## 2020-03-15 DIAGNOSIS — M7702 Medial epicondylitis, left elbow: Secondary | ICD-10-CM

## 2020-03-15 DIAGNOSIS — F3175 Bipolar disorder, in partial remission, most recent episode depressed: Secondary | ICD-10-CM

## 2020-03-15 MED ORDER — BUPROPION HCL ER (XL) 300 MG PO TB24: 300.0000 mg | ORAL_TABLET | Freq: Every day | ORAL | 1 refills | Status: DC

## 2020-03-15 MED ORDER — ZONISAMIDE 100 MG PO CAPS: 100.0000 mg | ORAL_CAPSULE | Freq: Every day | ORAL | 1 refills | Status: DC

## 2020-03-15 NOTE — Progress Notes (Signed)
BH MD/PA/NP OP Progress Note  03/15/2020 11:11 AM [name]  MRN:  [id number] Interview was conducted by phone and I verified that I was speaking with the correct person using two identifiers. I discussed the limitations of evaluation and management by telemedicine and  the availability of in person appointments. Patient expressed understanding and agreed to proceed. Participants in the visit: patient (location - home); physician (location - home office).  Chief Complaint: None.   HPI: 55yo single female withbipolar 1 disorder, most recent episode depressed,borderline personality disorder;hx of cannabis use disorderandalcohol use disorder in sustained remission.Sheis disabled.Several medication trials in the past - does not recall all meds she had been on. Very concerned about potential weight gain on meds (gained weight on Seroquel and Depakote).At her initial visit we decided to try a combination of lurasidone 20 mg and oxacarbazepine 150 mg bid - patient wanted to start on lowest doses possible.She developed hyponatremia and dizziness andoxcarbazepinewas discontinued. Zonisamide 100 mg at HS was started insteadand hyponatremia and dizziness resolved. She reports that she felt depressed in [place] year;mood also improved untilSeptemberwhen she began feeling more depressed, tearful.Wehave increased dose of Latuda to 40 mg but she started to experience muscle twitching and eventually stopped taking it.Lydiahad been on Wellbutrin previously for many years with good response (off it for at least 5 years). Werestarted150 mg of XL to begin withand then increased dose to 300 mg. Her mood improved. There are no mood fluctuation, no psychosis, no SI. She reports no changes in appetite and stable weight.Sleep is adequate and she no longer needs to take trazodone.     Visit Diagnosis:    ICD-10-CM   1. Bipolar 1 disorder, depressed, partial remission ([place])  F31.75      Past Psychiatric History: Please see intake H&P.  Past Medical History:  Past Medical History:  Diagnosis Date  . Anxiety   . Arthritis    neck, knees, shoulders  . Asthma   . Bipolar disorder ([place])    currently feeling MANIC- 10/02/2016  . Depression   . GERD (gastroesophageal reflux disease)   . Hip fracture ([place]) 06/2019  . History of blood transfusion    as a newborn   . History of lump of left breast   . Hyperlipidemia   . Lumbar pseudoarthrosis   . Motion sickness    cars  . OSA (obstructive sleep apnea) 01/09/2016   can't afford CPAP  . Personality disorder ([place])   . PONV (postoperative nausea and vomiting)   . Post traumatic stress disorder (PTSD)   . Substance abuse ([place])   . Synovial cyst     Past Surgical History:  Procedure Laterality Date  . COLONOSCOPY WITH PROPOFOL N/A 11/04/2017   Procedure: COLONOSCOPY WITH PROPOFOL;  Surgeon: Lin Landsman, MD;  Location: [facility];  Service: Endoscopy;  Laterality: N/A;  . ESOPHAGOGASTRODUODENOSCOPY (EGD) WITH PROPOFOL N/A 11/04/2017   Procedure: ESOPHAGOGASTRODUODENOSCOPY (EGD) WITH PROPOFOL with biopsies;  Surgeon: Lin Landsman, MD;  Location: [facility];  Service: Endoscopy;  Laterality: N/A;  sleep apnea  . KNEE SURGERY Left    x5, post basketball injury  . LUMBAR LAMINECTOMY/DECOMPRESSION MICRODISCECTOMY Left 01/22/2016   Procedure: Laminectomy for facet/synovial cyst - left - Lumbar four - lumbar five;  Surgeon: Earnie Larsson, MD;  Location: [facility];  Service: Neurosurgery;  Laterality: Left;  Laminectomy for facet/synovial cyst - left - Lumbar four - lumbar five  . POLYPECTOMY N/A 11/04/2017   Procedure: POLYPECTOMY INTESTINAL;  Surgeon:  Lin Landsman, MD;  Location: [facility];  Service: Endoscopy;  Laterality: N/A;  . SHOULDER SURGERY Left    x2  . SPINE SURGERY N/A    [employer]reesia 08/07/2019  . TOE SURGERY Bilateral    bone spurs    Family Psychiatric History:  Reviewed.  Family History:  Family History  Problem Relation Age of Onset  . Heart disease Father   . Hyperlipidemia Father   . Alcohol abuse Brother   . Alcohol abuse Paternal Uncle   . Breast cancer Maternal Aunt        70's  . Colon cancer Neg Hx     Social History:  Social History   Socioeconomic History  . Marital status: Single    Spouse name: Not on file  . Number of children: 0  . Years of education: Not on file  . Highest education level: Bachelor's degree (e.g., BA, AB, BS)  Occupational History  . Not on file  Tobacco Use  . Smoking status: Former Smoker    Packs/day: 1.00    Years: 1.00    Pack years: 1.00    Types: Cigarettes    Quit date: 04/15/1996    Years since quitting: 23.9  . Smokeless tobacco: Never Used  . Tobacco comment: started back again in 2016, smoked for about 6 mo and then quit  Vaping Use  . Vaping Use: Never used  Substance and Sexual Activity  . Alcohol use: No    Alcohol/week: 0.0 standard drinks    Comment: quit 20 years  . Drug use: Yes    Frequency: 14.0 times per week    Types: Marijuana    Comment: 20 yrs. ago- cocaine   . Sexual activity: Not Currently  Other Topics Concern  . Not on file  Social History Narrative  . Not on file   Social Determinants of Health   Financial Resource Strain: Not on file  Food Insecurity: Not on file  Transportation Needs: Not on file  Physical Activity: Not on file  Stress: Not on file  Social Connections: Not on file    Allergies:  Allergies  Allergen Reactions  . Other   . Effexor [Venlafaxine] Other (See Comments)    UNSPECIFIED REACTION, headaches, felt funny, withdrawal with missed dose   . Lamictal [Lamotrigine] Rash    Metabolic Disorder Labs: Lab Results  Component Value Date   HGBA1C 5.6 06/22/2018   No results found for: PROLACTIN Lab Results  Component Value Date   CHOL 168 08/10/2019   TRIG 103 08/10/2019   HDL 71 08/10/2019   CHOLHDL 2.4 08/10/2019    VLDL 22.2 06/22/2018   LDLCALC 79 08/10/2019   LDLCALC 79 06/22/2018   Lab Results  Component Value Date   TSH 1.30 03/15/2015    Therapeutic Level Labs: No results found for: LITHIUM No results found for: VALPROATE No components found for:  CBMZ  Current Medications: Current Outpatient Medications  Medication Sig Dispense Refill  . simvastatin (ZOCOR) 20 MG tablet Take 1 tablet (20 mg total) by mouth daily at 6 PM. 90 tablet 0  . albuterol (VENTOLIN HFA) 108 (90 Base) MCG/ACT inhaler 2 puffs every 4-6 hours as needed 18 g 1  . amoxicillin-clavulanate (AUGMENTIN) 875-125 MG tablet Take 1 tablet by mouth 2 (two) times daily. 20 tablet 0  . aspirin 81 MG tablet Take 81 mg by mouth daily.    [place] [facility] azelastine (ASTELIN) 0.1 % nasal spray PLACE 2 SPRAYS INTO BOTH NOSTRILS 2 (  TWO) TIMES DAILY. 30 mL 5  . benzonatate (TESSALON) 100 MG capsule Take 1-2 capsules (100-200 mg total) by mouth 3 (three) times daily as needed for cough. 40 capsule 0  . buPROPion (WELLBUTRIN XL) 300 MG 24 hr tablet Take 1 tablet (300 mg total) by mouth daily. 90 tablet 1  . calcium-vitamin D (OSCAL WITH D) 500-200 MG-UNIT tablet Take 1 tablet by mouth 3 (three) times daily. 90 tablet 6  . diphenoxylate-atropine (LOMOTIL) 2.5-0.025 MG tablet Take 1 tablet by mouth daily as needed for diarrhea or loose stools. 30 tablet 0  . Fluticasone-Salmeterol (WIXELA INHUB) 100-50 MCG/DOSE AEPB Inhale 1 puff into the lungs in the morning and at bedtime. MUST SCHEDULE PHYSICAL 60 each 5  . gabapentin (NEURONTIN) 300 MG capsule Take 1 capsule by mouth 3 (three) times daily.    . meloxicam (MOBIC) 7.5 MG tablet Take 1 tablet (7.5 mg total) by mouth 2 (two) times daily as needed for pain. 30 tablet 2  . montelukast (SINGULAIR) 10 MG tablet Take 1 tablet (10 mg total) by mouth at bedtime. 30 tablet 5  . morphine (MS CONTIN) 15 MG 12 hr tablet Take 1 tablet by mouth 2 (two) times daily.     . Olopatadine HCl 0.2 % SOLN PLACE 1 DROP INTO BOTH  EYES DAILY AS NEEDED. 7.5 mL 1  . promethazine-codeine (PHENERGAN WITH CODEINE) 6.25-10 MG/5ML syrup Take 5 mLs by mouth at bedtime. 60 mL 0  . tiZANidine (ZANAFLEX) 4 MG tablet     . trazodone (DESYREL) 300 MG tablet Take 1 tablet (300 mg total) by mouth at bedtime as needed for sleep. 30 tablet 2  . triamcinolone (NASACORT) 55 MCG/ACT AERO nasal inhaler     . zonisamide (ZONEGRAN) 100 MG capsule Take 1 capsule (100 mg total) by mouth at bedtime. 90 capsule 1   No current facility-administered medications for this visit.    Psychiatric Specialty Exam: Review of Systems  Musculoskeletal: Positive for back pain.  All other systems reviewed and are negative.   There were no vitals taken for this visit.There is no height or weight on file to calculate BMI.  General Appearance: NA  Eye Contact:  NA  Speech:  Clear and Coherent and Normal Rate  Volume:  Normal  Mood:  Euthymic  Affect:  NA  Thought Process:  Goal Directed and Linear  Orientation:  Full (Time, Place, and Person)  Thought Content: Logical   Suicidal Thoughts:  No  Homicidal Thoughts:  No  Memory:  Immediate;   Good Recent;   Good Remote;   Good  Judgement:  Good  Insight:  Good  Psychomotor Activity:  NA  Concentration:  Concentration: Good  Recall:  Good  Fund of Knowledge: Good  Language: Good  Akathisia:  Negative  Handed:  Right  AIMS (if indicated): not done  Assets:  Communication Skills Desire for Improvement Financial Resources/Insurance Housing Resilience  ADL's:  Intact  Cognition: WNL  Sleep:  Good   Screenings: PHQ2-9   [place] Office Visit from 06/22/2018 in [place] [facility] Visit from 05/21/2017 in [place] at [facility] Visit from 09/12/2015 in [place] at [facility] from 09/12/2014 in [place] at [facility]  PHQ-2 Total Score 0 0 0 1       Assessment and Plan: 55yo single female withbipolar 1  disorder, most recent episode depressed,borderline personality disorder;hx of cannabis use disorderandalcohol use disorder in sustained remission.Sheis disabled.Several medication  trials in the past - does not recall all meds she had been on. Very concerned about potential weight gain on meds (gained weight on Seroquel and Depakote).At her initial visit we decided to try a combination of lurasidone 20 mg and oxacarbazepine 150 mg bid - patient wanted to start on lowest doses possible.She developed hyponatremia and dizziness andoxcarbazepinewas discontinued. Zonisamide 100 mg at HS was started insteadand hyponatremia and dizziness resolved. She reports that she felt depressed in [place] year;mood also improved untilSeptemberwhen she began feeling more depressed, tearful.Wehave increased dose of Latuda to 40 mg but she started to experience muscle twitching and eventually stopped taking it.Lydiahad been on Wellbutrin previously for many years with good response (off it for at least 5 years). Werestarted150 mg of XL to begin withand then increased dose to 300 mg. Her mood improved. There are no mood fluctuation, no psychosis, no SI. She reports no changes in appetite and stable weight.Sleep is adequate and she no longer needs to take trazodone.   Dx; Bipolar 1 disorder recent episode depressed, in remission  Plan:We continuebupropion XL 300 mg and zonisamide 100 mg at HS.Shewill return to clinic inthreemonths with a new psychiatrist. The plan was discussed with patient who had an opportunity to ask questions and these were all answered. I spend62minutes inphone consultation with the patient.   Stephanie Acre, MD 03/15/2020, 11:11 AM

## 2020-03-22 ENCOUNTER — Encounter: Payer: Self-pay | Admitting: Neurology

## 2020-03-22 ENCOUNTER — Other Ambulatory Visit: Payer: Self-pay

## 2020-03-22 DIAGNOSIS — R202 Paresthesia of skin: Secondary | ICD-10-CM

## 2020-03-25 ENCOUNTER — Other Ambulatory Visit: Payer: Medicare HMO

## 2020-03-28 ENCOUNTER — Telehealth: Payer: Medicare HMO | Admitting: Family

## 2020-03-28 DIAGNOSIS — J069 Acute upper respiratory infection, unspecified: Secondary | ICD-10-CM

## 2020-03-28 MED ORDER — FLUTICASONE PROPIONATE 50 MCG/ACT NA SUSP: 2.0000 | Freq: Every day | NASAL | 6 refills | Status: DC

## 2020-03-28 MED ORDER — BENZONATATE 100 MG PO CAPS: 100.0000 mg | ORAL_CAPSULE | Freq: Three times a day (TID) | ORAL | 0 refills | Status: DC | PRN

## 2020-03-28 MED ORDER — PREDNISONE 10 MG (21) PO TBPK
ORAL_TABLET | ORAL | 0 refills | Status: DC
Start: 2020-03-28 — End: 2020-04-05

## 2020-03-28 NOTE — Addendum Note (Signed)
Addended by: Evelina Dun A on: 03/28/2020 08:04 PM   Modules accepted: Orders

## 2020-03-28 NOTE — Progress Notes (Signed)
We are sorry you are not feeling well.  Here is how we plan to help!  Based on what you have shared with me, it looks like you may have a viral upper respiratory infection.  Upper respiratory infections are caused by a large number of viruses; however, rhinovirus is the most common cause.   Given your symptoms, you need to be tested for COVID to rule out.   Symptoms vary from person to person, with common symptoms including sore throat, cough, fatigue or lack of energy and feeling of general discomfort.  A low-grade fever of up to 100.4 may present, but is often uncommon.  Symptoms vary however, and are closely related to a person's age or underlying illnesses.  The most common symptoms associated with an upper respiratory infection are nasal discharge or congestion, cough, sneezing, headache and pressure in the ears and face.  These symptoms usually persist for about 3 to 10 days, but can last up to 2 weeks.  It is important to know that upper respiratory infections do not cause serious illness or complications in most cases.    Upper respiratory infections can be transmitted from person to person, with the most common method of transmission being a person's hands.  The virus is able to live on the skin and can infect other persons for up to 2 hours after direct contact.  Also, these can be transmitted when someone coughs or sneezes; thus, it is important to cover the mouth to reduce this risk.  To keep the spread of the illness at bay, good hand hygiene is very important.  This is an infection that is most likely caused by a virus. There are no specific treatments other than to help you with the symptoms until the infection runs its course.  We are sorry you are not feeling well.  Here is how we plan to help!   For nasal congestion, you may use an oral decongestants such as Mucinex D or if you have glaucoma or high blood pressure use plain Mucinex.  Saline nasal spray or nasal drops can help and can  safely be used as often as needed for congestion.  For your congestion, I have prescribed Fluticasone nasal spray one spray in each nostril twice a day  If you do not have a history of heart disease, hypertension, diabetes or thyroid disease, prostate/bladder issues or glaucoma, you may also use Sudafed to treat nasal congestion.  It is highly recommended that you consult with a pharmacist or your primary care physician to ensure this medication is safe for you to take.     If you have a cough, you may use cough suppressants such as Delsym and Robitussin.  If you have glaucoma or high blood pressure, you can also use Coricidin HBP.   For cough I have prescribed for you A prescription cough medication called Tessalon Perles 100 mg. You may take 1-2 capsules every 8 hours as needed for cough  If you have a sore or scratchy throat, use a saltwater gargle-  to  teaspoon of salt dissolved in a 4-ounce to 8-ounce glass of warm water.  Gargle the solution for approximately 15-30 seconds and then spit.  It is important not to swallow the solution.  You can also use throat lozenges/cough drops and Chloraseptic spray to help with throat pain or discomfort.  Warm or cold liquids can also be helpful in relieving throat pain.  For headache, pain or general discomfort, you can use Ibuprofen or   Tylenol as directed.   Some authorities believe that zinc sprays or the use of Echinacea may shorten the course of your symptoms.   HOME CARE . Only take medications as instructed by your medical team. . Be sure to drink plenty of fluids. Water is fine as well as fruit juices, sodas and electrolyte beverages. You may want to stay away from caffeine or alcohol. If you are nauseated, try taking small sips of liquids. How do you know if you are getting enough fluid? Your urine should be a pale yellow or almost colorless. . Get rest. . Taking a steamy shower or using a humidifier may help nasal congestion and ease sore throat  pain. You can place a towel over your head and breathe in the steam from hot water coming from a faucet. . Using a saline nasal spray works much the same way. . Cough drops, hard candies and sore throat lozenges may ease your cough. . Avoid close contacts especially the very young and the elderly . Cover your mouth if you cough or sneeze . Always remember to wash your hands.   GET HELP RIGHT AWAY IF: . You develop worsening fever. . If your symptoms do not improve within 10 days . You develop yellow or green discharge from your nose over 3 days. . You have coughing fits . You develop a severe head ache or visual changes. . You develop shortness of breath, difficulty breathing or start having chest pain . Your symptoms persist after you have completed your treatment plan  MAKE SURE YOU   Understand these instructions.  Will watch your condition.  Will get help right away if you are not doing well or get worse.  Your e-visit answers were reviewed by a board certified advanced clinical practitioner to complete your personal care plan. Depending upon the condition, your plan could have included both over the counter or prescription medications. Please review your pharmacy choice. If there is a problem, you may call our nursing hot line at and have the prescription routed to another pharmacy. Your safety is important to us. If you have drug allergies check your prescription carefully.   You can use MyChart to ask questions about today's visit, request a non-urgent call back, or ask for a work or school excuse for 24 hours related to this e-Visit. If it has been greater than 24 hours you will need to follow up with your provider, or enter a new e-Visit to address those concerns. You will get an e-mail in the next two days asking about your experience.  I hope that your e-visit has been valuable and will speed your recovery. Thank you for using e-visits.  Approximately 5 minutes was spent  documenting and reviewing patient's chart.      

## 2020-03-29 ENCOUNTER — Telehealth: Payer: Self-pay | Admitting: Allergy

## 2020-03-29 NOTE — Telephone Encounter (Signed)
Left message to see if we could do a tele-visit.

## 2020-03-29 NOTE — Telephone Encounter (Signed)
Per Patient's mychart message, "Hey, I'm sick right now. I'm going to make an appointment when I get better. I do need a refill on my Wixela, before my appointment. Thank you"  Please advise.

## 2020-03-30 MED ORDER — FLUTICASONE-SALMETEROL 100-50 MCG/DOSE IN AEPB: 1.0000 | INHALATION_SPRAY | Freq: Two times a day (BID) | RESPIRATORY_TRACT | 0 refills | Status: DC

## 2020-03-30 NOTE — Telephone Encounter (Signed)
Called and notify patient to keep appt and that wixela sent to pharmacy.

## 2020-03-30 NOTE — Addendum Note (Signed)
Addended by: Valere Dross on: 03/30/2020 01:31 PM   Modules accepted: Orders

## 2020-03-31 NOTE — Patient Instructions (Addendum)
Allergic rhinitis with conjunctivitis (grass pollen, weed pollen, molds, and mouse) Continue Singulair 10 mg once a day Start Xyzal 5 mg once a day as needed Continue Pataday 1 drop each eye once a day as needed for itchy watery eyes Continue Astelin nasal spray 2 sprays each nostril twice a day as needed for runny nose/drainage Continue fluticasone nasal spray, but decrease to 2 sprays each nostril once a day as needed for stuffy nose. May use saline nasal rinse as needed for nasal symptoms. Use this prior to any medicated nasal spray.  Moderate persistent asthma Get STAT PA/lateral chest x-ray due to productive cough. We will call you with results. Finish prednisone given from tele-visit Stop Wixela 100-50  Start Wixela 250-50 1 puff twice a day to help prevent cough and wheeze Continue Singulair 10 mg as above May use albuterol 2 puffs every 4 hours as needed for cough, wheeze, tightness in chest, or shortness of breath. Also, may use albuterol 2 puffs 5-15 minutes prior to exercise  Recurrent sinusitis( received pneumovax on 05/30/2019) Had one round of antibiotics in October  Please let us know if this treatment plan is not working well for you. Schedule a follow up appointment in 2 months with spirometry

## 2020-04-02 ENCOUNTER — Other Ambulatory Visit: Payer: Self-pay

## 2020-04-02 ENCOUNTER — Ambulatory Visit (INDEPENDENT_AMBULATORY_CARE_PROVIDER_SITE_OTHER): Payer: Medicare HMO | Admitting: Family

## 2020-04-02 ENCOUNTER — Encounter: Payer: Self-pay | Admitting: Family

## 2020-04-02 DIAGNOSIS — R059 Cough, unspecified: Secondary | ICD-10-CM | POA: Diagnosis not present

## 2020-04-02 DIAGNOSIS — J4540 Moderate persistent asthma, uncomplicated: Secondary | ICD-10-CM | POA: Diagnosis not present

## 2020-04-02 DIAGNOSIS — H1013 Acute atopic conjunctivitis, bilateral: Secondary | ICD-10-CM

## 2020-04-02 DIAGNOSIS — J3089 Other allergic rhinitis: Secondary | ICD-10-CM | POA: Diagnosis not present

## 2020-04-02 MED ORDER — FLUTICASONE-SALMETEROL 250-50 MCG/DOSE IN AEPB: INHALATION_SPRAY | RESPIRATORY_TRACT | 3 refills | Status: DC

## 2020-04-02 NOTE — Progress Notes (Signed)
RE: [name] MRN: [id number] DOB: 1966-01-17 Date of Telemedicine Visit: 04/02/2020  Referring provider: Caryl Brennan* Primary care provider: Nicolette Bang, DO  Chief Complaint: Cough (Cough ongoing for awhile. She did do an evisit with [facility] and was given steroids which has helped. She also had a sore throat at the time as well. She is having nasal congestion and just does not feel well. No fever, no body aches and no chills.  negative rapid covid test. She did have exposure to her dad who had been exposed to someone who was positive that same morning of the exposure.)   Telemedicine Follow Up Visit via Telephone: I connected with [name] for a follow up on 04/02/20 by telephone and verified that I am speaking with the correct person using two identifiers.   I discussed the limitations, risks, security and privacy concerns of performing an evaluation and management service by telephone and the availability of in person appointments. I also discussed with the patient that there may be a patient responsible charge related to this service. The patient expressed understanding and agreed to proceed.  Patient is at home   Provider is at the office.  Visit start time: 11:10 AM Visit end time: 11:25 AM Insurance consent/check in by: [place] consent and medical assistant/nurse: Kayla B.  History of Present Illness: She is a 55 y.o. female, who is being followed for allergic rhinoconjunctivitis and moderate persistent asthma. Her previous allergy office visit was on September 14, 2019 with [name].   Moderate persistent asthma is reported as not well controlled with Wixela 100/51 puff twice a day, Singulair 10 mg once a day, and albuterol as needed.  She reports a productive cough with light brown sputum and denies shortness of breath, tightness in her chest, wheezing, and nocturnal awakenings.  She has been using her albuterol at least once daily since being  sick.  She is unsure of the amount of steroids she has had since her last office visit.  She reports that since the beginning of January she has had a cough, it got a little bit better but then got worse near the end of last week.  She reports that her father was around someone who tested positive for COVID-19 late last week and then he visited her.  On March 28, 2018 she had an E-visit for these symptoms and was given a prescription for prednisone, Tessalon Perles, and fluticasone nasal spray.  She reports that the prednisone has helped her cough some and that the color of her sputum is now light brown versus a dark brown that was before. She has one day of prednisone left.  She denies any fevers, chills, or body aches.  She has not had a chest x-ray.  She did get tested for COVID-19 as recommended from the E-visit and reports that her rapid test was negative.  Allergic rhinoconjunctivitis is reported as moderately controlled with fluticasone nasal spray 2 sprays each nostril twice a day, Singulair 10 mg once a day as needed,and Astelin nasal spray as needed. She ran out of Xyzal.  She reports occasional clear rhinorrhea, nasal congestion, and sinus tenderness.  She denies any postnasal drip.  She reports that she has been on 1 round of antibiotics since her last office visit.  She thinks that this was sometime in the fall.  She is not able to remember likely when.  Assessment and Plan: [name] is a 55 y.o. female with: Patient Instructions  Allergic rhinitis with conjunctivitis (grass pollen, weed pollen, molds, and mouse) Continue Singulair 10 mg once a day Start Xyzal 5 mg once a day as needed Continue Pataday 1 drop each eye once a day as needed for itchy watery eyes Continue Astelin nasal spray 2 sprays each nostril twice a day as needed for runny nose/drainage Continue fluticasone nasal spray, but decrease to 2 sprays each nostril once a day as needed for stuffy nose. May use saline nasal  rinse as needed for nasal symptoms. Use this prior to any medicated nasal spray.  Moderate persistent asthma Get STAT PA/lateral chest x-ray due to productive cough. We will call you with results. Finish prednisone given from tele-visit Stop Wixela 100-50  Start Wixela 250-50 1 puff twice a day to help prevent cough and wheeze Continue Singulair 10 mg as above May use albuterol 2 puffs every 4 hours as needed for cough, wheeze, tightness in chest, or shortness of breath. Also, may use albuterol 2 puffs 5-15 minutes prior to exercise  Recurrent sinusitis( received pneumovax on 05/30/2019) Had one round of antibiotics in October  Please let us know if this treatment plan is not working well for you. Schedule a follow up appointment in 2 months with spirometry   Return in about 2 months (around 05/31/2020), or if symptoms worsen or fail to improve.  No orders of the defined types were placed in this encounter.  Lab Orders  No laboratory test(s) ordered today    Diagnostics: None.  Medication List:  Current Outpatient Medications  Medication Sig Dispense Refill  . albuterol (VENTOLIN HFA) 108 (90 Base) MCG/ACT inhaler 2 puffs every 4-6 hours as needed 18 g 1  . aspirin 81 MG tablet Take 81 mg by mouth daily.    Marland Kitchen azelastine (ASTELIN) 0.1 % nasal spray PLACE 2 SPRAYS INTO BOTH NOSTRILS 2 (TWO) TIMES DAILY. 30 mL 5  . benzonatate (TESSALON PERLES) 100 MG capsule Take 1 capsule (100 mg total) by mouth 3 (three) times daily as needed. 20 capsule 0  . buPROPion (WELLBUTRIN XL) 300 MG 24 hr tablet Take 1 tablet (300 mg total) by mouth daily. 90 tablet 1  . calcium-vitamin D (OSCAL WITH D) 500-200 MG-UNIT tablet Take 1 tablet by mouth 3 (three) times daily. 90 tablet 6  . fluticasone (FLONASE) 50 MCG/ACT nasal spray Place 2 sprays into both nostrils daily. 16 g 6  . Fluticasone-Salmeterol (WIXELA INHUB) 100-50 MCG/DOSE AEPB Inhale 1 puff into the lungs in the morning and at bedtime. MUST  SCHEDULE PHYSICAL 60 each 0  . gabapentin (NEURONTIN) 300 MG capsule Take 1 capsule by mouth 3 (three) times daily.    . meloxicam (MOBIC) 7.5 MG tablet Take 1 tablet (7.5 mg total) by mouth 2 (two) times daily as needed for pain. 30 tablet 2  . montelukast (SINGULAIR) 10 MG tablet Take 1 tablet (10 mg total) by mouth at bedtime. 30 tablet 5  . morphine (MS CONTIN) 15 MG 12 hr tablet Take 1 tablet by mouth 2 (two) times daily.     . Olopatadine HCl 0.2 % SOLN PLACE 1 DROP INTO BOTH EYES DAILY AS NEEDED. 7.5 mL 1  . predniSONE (STERAPRED UNI-PAK 21 TAB) 10 MG (21) TBPK tablet Use as directed 21 tablet 0  . simvastatin (ZOCOR) 20 MG tablet Take 1 tablet (20 mg total) by mouth daily at 6 PM. 90 tablet 0  . tiZANidine (ZANAFLEX) 4 MG tablet     . triamcinolone (NASACORT) 55 MCG/ACT AERO  nasal inhaler     . zonisamide (ZONEGRAN) 100 MG capsule Take 1 capsule (100 mg total) by mouth at bedtime. 90 capsule 1  . trazodone (DESYREL) 300 MG tablet Take 1 tablet (300 mg total) by mouth at bedtime as needed for sleep. 30 tablet 2   No current facility-administered medications for this visit.   Allergies: Allergies  Allergen Reactions  . Other   . Effexor [Venlafaxine] Other (See Comments)    UNSPECIFIED REACTION, headaches, felt funny, withdrawal with missed dose   . Lamictal [Lamotrigine] Rash   I reviewed her past medical history, social history, family history, and environmental history and no significant changes have been reported from previous visit on 09/14/2019.  Review of Systems  Constitutional: Negative for chills and fatigue.  HENT: Positive for congestion and sinus pain. Negative for postnasal drip and rhinorrhea.   Eyes:       Reports occasional itchy eyes  Respiratory: Positive for cough. Negative for chest tightness, shortness of breath and wheezing.        Reports productive cough with "brownish" sputum. The color has gotten lighter in color since being on prednisone   Cardiovascular: Negative for chest pain and palpitations.  Genitourinary:       Reports diarrhea and vomiting last week  Musculoskeletal: Negative for myalgias.  Skin: Negative for rash.  Neurological: Negative for headaches.   Objective: Physical Exam Not obtained as encounter was done via telephone.   Previous notes and tests were reviewed.  I discussed the assessment and treatment plan with the patient. The patient was provided an opportunity to ask questions and all were answered. The patient agreed with the plan and demonstrated an understanding of the instructions.   The patient was advised to call back or seek an in-person evaluation if the symptoms worsen or if the condition fails to improve as anticipated.  I provided 15 minutes of non-face-to-face time during this encounter.  It was my pleasure to participate in [facility]'s care today. Please feel free to contact me with any questions or concerns.   Sincerely,  Althea Charon, FNP

## 2020-04-03 ENCOUNTER — Telehealth: Payer: Self-pay

## 2020-04-03 NOTE — Telephone Encounter (Signed)
Called patient and left message inquiring if she has went to have xray done and  to return the call

## 2020-04-03 NOTE — Telephone Encounter (Signed)
-----   Message from Althea Charon, [place] sent at 04/03/2020  8:24 AM EST ----- Regarding: chest x-ray Please call and see if she went to get her chest x-ray completed yesterday. Thank you! Althea Charon, FNP

## 2020-04-04 ENCOUNTER — Encounter: Payer: Medicare HMO | Admitting: Physician Assistant

## 2020-04-04 ENCOUNTER — Encounter: Payer: Medicare HMO | Admitting: Neurology

## 2020-04-04 NOTE — Progress Notes (Signed)
Established Patient Office Visit  Subjective:  Patient ID: [name], female    DOB: June 30, 1965  Age: 55 y.o. MRN: [id number]  CC: No chief complaint on file.   HPI [name] presents for A few weeks ago sxs,   Very high blod pressure   Past Medical History:  Diagnosis Date  . Anxiety   . Arthritis    neck, knees, shoulders  . Asthma   . Bipolar disorder ([place])    currently feeling MANIC- 10/02/2016  . Depression   . GERD (gastroesophageal reflux disease)   . Hip fracture ([place]) 06/2019  . History of blood transfusion    as a newborn   . History of lump of left breast   . Hyperlipidemia   . Lumbar pseudoarthrosis   . Motion sickness    cars  . OSA (obstructive sleep apnea) 01/09/2016   can't afford CPAP  . Personality disorder ([place])   . PONV (postoperative nausea and vomiting)   . Post traumatic stress disorder (PTSD)   . Substance abuse ([place])   . Synovial cyst     Past Surgical History:  Procedure Laterality Date  . COLONOSCOPY WITH PROPOFOL N/A 11/04/2017   Procedure: COLONOSCOPY WITH PROPOFOL;  Surgeon: Lin Landsman, MD;  Location: [facility];  Service: Endoscopy;  Laterality: N/A;  . ESOPHAGOGASTRODUODENOSCOPY (EGD) WITH PROPOFOL N/A 11/04/2017   Procedure: ESOPHAGOGASTRODUODENOSCOPY (EGD) WITH PROPOFOL with biopsies;  Surgeon: Lin Landsman, MD;  Location: [facility];  Service: Endoscopy;  Laterality: N/A;  sleep apnea  . KNEE SURGERY Left    x5, post basketball injury  . LUMBAR LAMINECTOMY/DECOMPRESSION MICRODISCECTOMY Left 01/22/2016   Procedure: Laminectomy for facet/synovial cyst - left - Lumbar four - lumbar five;  Surgeon: Earnie Larsson, MD;  Location: [facility];  Service: Neurosurgery;  Laterality: Left;  Laminectomy for facet/synovial cyst - left - Lumbar four - lumbar five  . POLYPECTOMY N/A 11/04/2017   Procedure: POLYPECTOMY INTESTINAL;  Surgeon: Lin Landsman, MD;  Location: [facility];  Service: Endoscopy;   Laterality: N/A;  . SHOULDER SURGERY Left    x2  . SPINE SURGERY N/A    [employer] 08/07/2019  . TOE SURGERY Bilateral    bone spurs    Family History  Problem Relation Age of Onset  . Heart disease Father   . Hyperlipidemia Father   . Alcohol abuse Brother   . Alcohol abuse Paternal Uncle   . Breast cancer Maternal Aunt        70's  . Colon cancer Neg Hx     Social History   Socioeconomic History  . Marital status: Single    Spouse name: Not on file  . Number of children: 0  . Years of education: Not on file  . Highest education level: Bachelor's degree (e.g., BA, AB, BS)  Occupational History  . Not on file  Tobacco Use  . Smoking status: Former Smoker    Packs/day: 1.00    Years: 1.00    Pack years: 1.00    Types: Cigarettes    Quit date: 04/15/1996    Years since quitting: 23.9  . Smokeless tobacco: Never Used  . Tobacco comment: started back again in 2016, smoked for about 6 mo and then quit  Vaping Use  . Vaping Use: Never used  Substance and Sexual Activity  . Alcohol use: No    Alcohol/week: 0.0 standard drinks    Comment: quit 20 years  . Drug use: Yes  Frequency: 14.0 times per week    Types: Marijuana    Comment: 20 yrs. ago- cocaine   . Sexual activity: Not Currently  Other Topics Concern  . Not on file  Social History Narrative  . Not on file   Social Determinants of Health   Financial Resource Strain: Not on file  Food Insecurity: Not on file  Transportation Needs: Not on file  Physical Activity: Not on file  Stress: Not on file  Social Connections: Not on file  Intimate Partner Violence: Not on file    Outpatient Medications Prior to Visit  Medication Sig Dispense Refill  . albuterol (VENTOLIN HFA) 108 (90 Base) MCG/ACT inhaler 2 puffs every 4-6 hours as needed 18 g 1  . aspirin 81 MG tablet Take 81 mg by mouth daily.    Marland Kitchen azelastine (ASTELIN) 0.1 % nasal spray PLACE 2 SPRAYS INTO BOTH NOSTRILS 2 (TWO) TIMES DAILY. 30 mL 5  .  benzonatate (TESSALON PERLES) 100 MG capsule Take 1 capsule (100 mg total) by mouth 3 (three) times daily as needed. 20 capsule 0  . buPROPion (WELLBUTRIN XL) 300 MG 24 hr tablet Take 1 tablet (300 mg total) by mouth daily. 90 tablet 1  . calcium-vitamin D (OSCAL WITH D) 500-200 MG-UNIT tablet Take 1 tablet by mouth 3 (three) times daily. 90 tablet 6  . fluticasone (FLONASE) 50 MCG/ACT nasal spray Place 2 sprays into both nostrils daily. 16 g 6  . Fluticasone-Salmeterol (WIXELA INHUB) 100-50 MCG/DOSE AEPB Inhale 1 puff into the lungs in the morning and at bedtime. MUST SCHEDULE PHYSICAL 60 each 0  . Fluticasone-Salmeterol (WIXELA INHUB) 250-50 MCG/DOSE AEPB Inhale 1 puff twice a day to help prevent cough and wheeze. 60 each 3  . gabapentin (NEURONTIN) 300 MG capsule Take 1 capsule by mouth 3 (three) times daily.    . meloxicam (MOBIC) 7.5 MG tablet Take 1 tablet (7.5 mg total) by mouth 2 (two) times daily as needed for pain. 30 tablet 2  . montelukast (SINGULAIR) 10 MG tablet Take 1 tablet (10 mg total) by mouth at bedtime. 30 tablet 5  . morphine (MS CONTIN) 15 MG 12 hr tablet Take 1 tablet by mouth 2 (two) times daily.     . Olopatadine HCl 0.2 % SOLN PLACE 1 DROP INTO BOTH EYES DAILY AS NEEDED. 7.5 mL 1  . predniSONE (STERAPRED UNI-PAK 21 TAB) 10 MG (21) TBPK tablet Use as directed 21 tablet 0  . simvastatin (ZOCOR) 20 MG tablet Take 1 tablet (20 mg total) by mouth daily at 6 PM. 90 tablet 0  . tiZANidine (ZANAFLEX) 4 MG tablet     . trazodone (DESYREL) 300 MG tablet Take 1 tablet (300 mg total) by mouth at bedtime as needed for sleep. 30 tablet 2  . triamcinolone (NASACORT) 55 MCG/ACT AERO nasal inhaler     . zonisamide (ZONEGRAN) 100 MG capsule Take 1 capsule (100 mg total) by mouth at bedtime. 90 capsule 1   No facility-administered medications prior to visit.    Allergies  Allergen Reactions  . Other   . Effexor [Venlafaxine] Other (See Comments)    UNSPECIFIED REACTION, headaches,  felt funny, withdrawal with missed dose   . Lamictal [Lamotrigine] Rash    ROS Review of Systems    Objective:    Physical Exam  There were no vitals taken for this visit. Wt Readings from Last 3 Encounters:  09/14/19 139 lb (63 kg)  08/10/19 129 lb (58.5 kg)  05/12/19 141  lb (64 kg)     Health Maintenance Due  Topic Date Due  . COVID-19 Vaccine (1) Never done  . INFLUENZA VACCINE  10/02/2019    There are no preventive care reminders to display for this patient.  Lab Results  Component Value Date   TSH 1.30 03/15/2015   Lab Results  Component Value Date   WBC 7.7 05/12/2019   HGB 13.8 05/12/2019   HCT 42.0 05/12/2019   MCV 93 05/12/2019   PLT 417 05/12/2019   Lab Results  Component Value Date   NA 136 05/12/2019   K 4.1 05/12/2019   CO2 23 05/12/2019   GLUCOSE 107 (H) 05/12/2019   BUN 7 05/12/2019   CREATININE 0.89 05/12/2019   BILITOT 0.2 05/12/2019   ALKPHOS 75 05/12/2019   AST 19 05/12/2019   ALT 23 05/12/2019   PROT 6.4 05/12/2019   ALBUMIN 4.2 05/12/2019   CALCIUM 9.3 05/12/2019   ANIONGAP 9 10/28/2016   GFR 77.07 07/07/2018   Lab Results  Component Value Date   CHOL 168 08/10/2019   Lab Results  Component Value Date   HDL 71 08/10/2019   Lab Results  Component Value Date   LDLCALC 79 08/10/2019   Lab Results  Component Value Date   TRIG 103 08/10/2019   Lab Results  Component Value Date   CHOLHDL 2.4 08/10/2019   Lab Results  Component Value Date   HGBA1C 5.6 06/22/2018      Assessment & Plan:   Problem List Items Addressed This Visit   None     No orders of the defined types were placed in this encounter.   Follow-up: No follow-ups on file.    Taia Bramlett S Skyann Ganim, PA-C This encounter was created in error - please disregard.

## 2020-04-04 NOTE — Telephone Encounter (Signed)
Patient called and said that she went Monday but they would not see her because she was sick and she is going to her primary doctor tomorrow to have a rapid test. She said when she get better she will go and get the xray. [phone].

## 2020-04-04 NOTE — Progress Notes (Signed)
Patient verified DOB Patient has eaten today and patient has taken medication today. Patient denies pain at this time. Patient complains of HA scaled at a 6 located above the left eye. Patients taste and smell is present. Patient denies body aches/ chills. Patient denies sore throat. Patient complains of runny nose with thick white mucous. Patient denies fatigue. Patient took home test yesterday and last Thursday. Patient complains of nausea and "does not feel up to going any where" so she has not completed the xray.

## 2020-04-04 NOTE — Telephone Encounter (Signed)
Noted thank you

## 2020-04-05 ENCOUNTER — Encounter: Payer: Self-pay | Admitting: Physician Assistant

## 2020-04-05 ENCOUNTER — Ambulatory Visit (INDEPENDENT_AMBULATORY_CARE_PROVIDER_SITE_OTHER): Payer: Medicare HMO

## 2020-04-05 ENCOUNTER — Ambulatory Visit (INDEPENDENT_AMBULATORY_CARE_PROVIDER_SITE_OTHER): Payer: Medicare HMO | Admitting: Physician Assistant

## 2020-04-05 VITALS — BP 155/105 | HR 89 | Temp 97.2°F | Resp 18 | Ht 61.0 in | Wt 130.0 lb

## 2020-04-05 DIAGNOSIS — J45901 Unspecified asthma with (acute) exacerbation: Secondary | ICD-10-CM

## 2020-04-05 DIAGNOSIS — G44209 Tension-type headache, unspecified, not intractable: Secondary | ICD-10-CM | POA: Diagnosis not present

## 2020-04-05 DIAGNOSIS — R0789 Other chest pain: Secondary | ICD-10-CM

## 2020-04-05 DIAGNOSIS — R079 Chest pain, unspecified: Secondary | ICD-10-CM | POA: Diagnosis not present

## 2020-04-05 DIAGNOSIS — R059 Cough, unspecified: Secondary | ICD-10-CM | POA: Diagnosis not present

## 2020-04-05 DIAGNOSIS — Z23 Encounter for immunization: Secondary | ICD-10-CM

## 2020-04-05 DIAGNOSIS — R030 Elevated blood-pressure reading, without diagnosis of hypertension: Secondary | ICD-10-CM | POA: Diagnosis not present

## 2020-04-05 DIAGNOSIS — R9389 Abnormal findings on diagnostic imaging of other specified body structures: Secondary | ICD-10-CM

## 2020-04-05 LAB — EKG 12-LEAD

## 2020-04-05 LAB — POCT INFLUENZA A/B
Influenza A, POC: NEGATIVE
Influenza B, POC: NEGATIVE

## 2020-04-05 LAB — POC COVID19 BINAXNOW: SARS Coronavirus 2 Ag: NEGATIVE

## 2020-04-05 NOTE — Progress Notes (Signed)
Patient is aware of CT being needed. Patient is aware of PA being completed on tomorrow and appointment being scheduled no later than Monday. Patient advised to report to ER if fevers occur or worsened BP readings.

## 2020-04-05 NOTE — Patient Instructions (Signed)
I encourage you to check your blood pressure on a daily basis, keep a written log, bring that with you to your next office visit in 2 weeks for further review.  I encourage you to increase your water intake to 64 ounces a day.  We will call you with your lab results  Kennieth Rad, PA-C Physician Assistant [place] [email or web address]    How to Take Your Blood Pressure Blood pressure is a measurement of how strongly your blood is pressing against the walls of your arteries. Arteries are blood vessels that carry blood from your heart throughout your body. Your health care provider takes your blood pressure at each office visit. You can also take your own blood pressure at home with a blood pressure monitor. You may need to take your own blood pressure to:  Confirm a diagnosis of high blood pressure (hypertension).  Monitor your blood pressure over time.  Make sure your blood pressure medicine is working. Supplies needed:  Blood pressure monitor.  Dining room chair to sit in.  Table or desk.  Small notebook and pencil or pen. How to prepare To get the most accurate reading, avoid the following for 30 minutes before you check your blood pressure:  Drinking caffeine.  Drinking alcohol.  Eating.  Smoking.  Exercising. Five minutes before you check your blood pressure:  Use the bathroom and urinate so that you have an empty bladder.  Sit quietly in a dining room chair. Do not sit in a soft couch or an armchair. Do not talk. How to take your blood pressure To check your blood pressure, follow the instructions in the manual that came with your blood pressure monitor. If you have a digital blood pressure monitor, the instructions may be as follows: 1. Sit up straight in a chair. 2. Place your feet on the floor. Do not cross your ankles or legs. 3. Rest your left arm at the level of your heart on a table or desk or on  the arm of a chair. 4. Pull up your shirt sleeve. 5. Wrap the blood pressure cuff around the upper part of your left arm, 1 inch (2.5 cm) above your elbow. It is best to wrap the cuff around bare skin. 6. Fit the cuff snugly around your arm. You should be able to place only one finger between the cuff and your arm. 7. Position the cord so that it rests in the bend of your elbow. 8. Press the power button. 9. Sit quietly while the cuff inflates and deflates. 10. Read the digital reading on the monitor screen and write the numbers down (record them) in a notebook. 11. Wait 2-3 minutes, then repeat the steps, starting at step 1.   What does my blood pressure reading mean? A blood pressure reading consists of a higher number over a lower number. Ideally, your blood pressure should be below 120/80. The first ("top") number is called the systolic pressure. It is a measure of the pressure in your arteries as your heart beats. The second ("bottom") number is called the diastolic pressure. It is a measure of the pressure in your arteries as the heart relaxes. Blood pressure is classified into five stages. The following are the stages for adults who do not have a short-term serious illness or a chronic condition. Systolic pressure and diastolic pressure are measured in a unit called mm Hg (millimeters of mercury).  Normal  Systolic pressure: below 409.  Diastolic pressure: below 80. Elevated  Systolic pressure: 268-341.  Diastolic pressure: below 80. Hypertension stage 1  Systolic pressure: 962-229.  Diastolic pressure: 79-89. Hypertension stage 2  Systolic pressure: 211 or above.  Diastolic pressure: 90 or above. You can have elevated blood pressure or hypertension even if only the systolic or only the diastolic number in your reading is higher than normal. Follow these instructions at home:  Check your blood pressure as often as recommended by your health care provider.  Check your blood  pressure at the same time every day.  Take your monitor to the next appointment with your health care provider to make sure that: ? You are using it correctly. ? It provides accurate readings.  Be sure you understand what your goal blood pressure numbers are.  Tell your health care provider if you are having any side effects from blood pressure medicine.  Keep all follow-up visits as told by your health care provider. This is important. General tips  Your health care provider can suggest a reliable monitor that will meet your needs. There are several types of home blood pressure monitors.  Choose a monitor that has an arm cuff. Do not choose a monitor that measures your blood pressure from your wrist or finger.  Choose a cuff that wraps snugly around your upper arm. You should be able to fit only one finger between your arm and the cuff.  You can buy a blood pressure monitor at most drugstores or online. Where to find more information American Heart Association: www.heart.org Contact a health care provider if:  Your blood pressure is consistently high. Get help right away if:  Your systolic blood pressure is higher than 180.  Your diastolic blood pressure is higher than 120. Summary  Blood pressure is a measurement of how strongly your blood is pressing against the walls of your arteries.  A blood pressure reading consists of a higher number over a lower number. Ideally, your blood pressure should be below 120/80.  Check your blood pressure at the same time every day.  Avoid caffeine, alcohol, smoking, and exercise for 30 minutes prior to checking your blood pressure. These agents can affect the accuracy of the blood pressure reading. This information is not intended to replace advice given to you by your health care provider. Make sure you discuss any questions you have with your health care provider. Document Revised: 02/11/2019 Document Reviewed: 02/11/2019 Elsevier  Patient Education  2021 [employer].

## 2020-04-05 NOTE — Progress Notes (Signed)
Patient has taken medication at 7:30 am and has eaten today. Patient complains of neck and back pain scaled at a 5 currently. Patient reports never having HTN concerns previously.

## 2020-04-05 NOTE — Progress Notes (Signed)
Established Patient Office Visit  Subjective:  Patient ID: [name], female    DOB: 1965/10/14  Age: 55 y.o. MRN: [id number]  CC:  Chief Complaint  Patient presents with  . Blood Pressure Check    HPI [name] reports that she has been having elevated blood pressure readings at her last couple office visits, states that she does not check her blood pressure at home, but can tell her blood pressure is elevated because she has been having more frequent headaches.  Reports that she has been having some chest tightness over the last couple of weeks.  Reports that she is currently being treated for moderate persistent asthma, was due to have a chest x-ray due to productive cough.  Reports that she had not had that completed yet due to not feeling well enough to get the x-ray completed.  Reports that she has been compliant to the medication and is taking the steroid treatment as prescribed.  Reports that she continues to have a productive cough   Past Medical History:  Diagnosis Date  . Anxiety   . Arthritis    neck, knees, shoulders  . Asthma   . Bipolar disorder ([place])    currently feeling MANIC- 10/02/2016  . Depression   . GERD (gastroesophageal reflux disease)   . Hip fracture ([place]) 06/2019  . History of blood transfusion    as a newborn   . History of lump of left breast   . Hyperlipidemia   . Lumbar pseudoarthrosis   . Motion sickness    cars  . OSA (obstructive sleep apnea) 01/09/2016   can't afford CPAP  . Personality disorder ([place])   . PONV (postoperative nausea and vomiting)   . Post traumatic stress disorder (PTSD)   . Substance abuse ([place])   . Synovial cyst     Past Surgical History:  Procedure Laterality Date  . COLONOSCOPY WITH PROPOFOL N/A 11/04/2017   Procedure: COLONOSCOPY WITH PROPOFOL;  Surgeon: Lin Landsman, MD;  Location: [facility];  Service: Endoscopy;  Laterality: N/A;  . ESOPHAGOGASTRODUODENOSCOPY (EGD) WITH PROPOFOL N/A  11/04/2017   Procedure: ESOPHAGOGASTRODUODENOSCOPY (EGD) WITH PROPOFOL with biopsies;  Surgeon: Lin Landsman, MD;  Location: [facility];  Service: Endoscopy;  Laterality: N/A;  sleep apnea  . KNEE SURGERY Left    x5, post basketball injury  . LUMBAR LAMINECTOMY/DECOMPRESSION MICRODISCECTOMY Left 01/22/2016   Procedure: Laminectomy for facet/synovial cyst - left - Lumbar four - lumbar five;  Surgeon: Earnie Larsson, MD;  Location: [facility];  Service: Neurosurgery;  Laterality: Left;  Laminectomy for facet/synovial cyst - left - Lumbar four - lumbar five  . POLYPECTOMY N/A 11/04/2017   Procedure: POLYPECTOMY INTESTINAL;  Surgeon: Lin Landsman, MD;  Location: [facility];  Service: Endoscopy;  Laterality: N/A;  . SHOULDER SURGERY Left    x2  . SPINE SURGERY N/A    [employer] 08/07/2019  . TOE SURGERY Bilateral    bone spurs    Family History  Problem Relation Age of Onset  . Heart disease Father   . Hyperlipidemia Father   . Alcohol abuse Brother   . Alcohol abuse Paternal Uncle   . Breast cancer Maternal Aunt        70's  . Colon cancer Neg Hx     Social History   Socioeconomic History  . Marital status: Single    Spouse name: Not on file  . Number of children: 0  . Years of education:  Not on file  . Highest education level: Bachelor's degree (e.g., BA, AB, BS)  Occupational History  . Not on file  Tobacco Use  . Smoking status: Former Smoker    Packs/day: 1.00    Years: 1.00    Pack years: 1.00    Types: Cigarettes    Quit date: 04/15/1996    Years since quitting: 23.9  . Smokeless tobacco: Never Used  . Tobacco comment: started back again in 2016, smoked for about 6 mo and then quit  Vaping Use  . Vaping Use: Never used  Substance and Sexual Activity  . Alcohol use: No    Alcohol/week: 0.0 standard drinks    Comment: quit 20 years  . Drug use: Yes    Frequency: 14.0 times per week    Types: Marijuana    Comment: 20 yrs. ago- cocaine   . Sexual  activity: Not Currently  Other Topics Concern  . Not on file  Social History Narrative  . Not on file   Social Determinants of Health   Financial Resource Strain: Not on file  Food Insecurity: Not on file  Transportation Needs: Not on file  Physical Activity: Not on file  Stress: Not on file  Social Connections: Not on file  Intimate Partner Violence: Not on file    Outpatient Medications Prior to Visit  Medication Sig Dispense Refill  . albuterol (VENTOLIN HFA) 108 (90 Base) MCG/ACT inhaler 2 puffs every 4-6 hours as needed 18 g 1  . aspirin 81 MG tablet Take 81 mg by mouth daily.    Marland Kitchen buPROPion (WELLBUTRIN XL) 300 MG 24 hr tablet Take 1 tablet (300 mg total) by mouth daily. 90 tablet 1  . calcium-vitamin D (OSCAL WITH D) 500-200 MG-UNIT tablet Take 1 tablet by mouth 3 (three) times daily. 90 tablet 6  . fluticasone (FLONASE) 50 MCG/ACT nasal spray Place 2 sprays into both nostrils daily. 16 g 6  . Fluticasone-Salmeterol (WIXELA INHUB) 100-50 MCG/DOSE AEPB Inhale 1 puff into the lungs in the morning and at bedtime. MUST SCHEDULE PHYSICAL 60 each 0  . Fluticasone-Salmeterol (WIXELA INHUB) 250-50 MCG/DOSE AEPB Inhale 1 puff twice a day to help prevent cough and wheeze. 60 each 3  . gabapentin (NEURONTIN) 300 MG capsule Take 1 capsule by mouth 3 (three) times daily.    . meloxicam (MOBIC) 7.5 MG tablet Take 1 tablet (7.5 mg total) by mouth 2 (two) times daily as needed for pain. 30 tablet 2  . montelukast (SINGULAIR) 10 MG tablet Take 1 tablet (10 mg total) by mouth at bedtime. 30 tablet 5  . Olopatadine HCl 0.2 % SOLN PLACE 1 DROP INTO BOTH EYES DAILY AS NEEDED. 7.5 mL 1  . simvastatin (ZOCOR) 20 MG tablet Take 1 tablet (20 mg total) by mouth daily at 6 PM. 90 tablet 0  . tiZANidine (ZANAFLEX) 4 MG tablet     . triamcinolone (NASACORT) 55 MCG/ACT AERO nasal inhaler     . zonisamide (ZONEGRAN) 100 MG capsule Take 1 capsule (100 mg total) by mouth at bedtime. 90 capsule 1  . azelastine  (ASTELIN) 0.1 % nasal spray PLACE 2 SPRAYS INTO BOTH NOSTRILS 2 (TWO) TIMES DAILY. 30 mL 5  . trazodone (DESYREL) 300 MG tablet Take 1 tablet (300 mg total) by mouth at bedtime as needed for sleep. 30 tablet 2  . benzonatate (TESSALON PERLES) 100 MG capsule Take 1 capsule (100 mg total) by mouth 3 (three) times daily as needed. 20 capsule 0  .  morphine (MS CONTIN) 15 MG 12 hr tablet Take 1 tablet by mouth 2 (two) times daily.     . predniSONE (STERAPRED UNI-PAK 21 TAB) 10 MG (21) TBPK tablet Use as directed 21 tablet 0   No facility-administered medications prior to visit.    Allergies  Allergen Reactions  . Other   . Effexor [Venlafaxine] Other (See Comments)    UNSPECIFIED REACTION, headaches, felt funny, withdrawal with missed dose   . Lamictal [Lamotrigine] Rash    ROS Review of Systems  Constitutional: Negative for chills and fever.  HENT: Positive for congestion. Negative for sore throat.   Eyes: Negative.   Respiratory: Positive for cough, chest tightness and shortness of breath.   Cardiovascular: Negative.   Gastrointestinal: Negative for abdominal pain, diarrhea, nausea and vomiting.  Endocrine: Negative.   Genitourinary: Negative.   Musculoskeletal: Negative.   Skin: Negative.   Allergic/Immunologic: Negative.   Neurological: Positive for headaches. Negative for dizziness and weakness.  Hematological: Negative.   Psychiatric/Behavioral: Negative.       Objective:    Physical Exam Vitals and nursing note reviewed.  Constitutional:      General: She is not in acute distress.    Appearance: Normal appearance. She is not ill-appearing.  HENT:     Head: Normocephalic and atraumatic.     Right Ear: External ear normal.     Nose: Nose normal.     Mouth/Throat:     Mouth: Mucous membranes are moist.     Pharynx: Oropharynx is clear.  Eyes:     Extraocular Movements: Extraocular movements intact.     Conjunctiva/sclera: Conjunctivae normal.     Pupils: Pupils are  equal, round, and reactive to light.  Cardiovascular:     Rate and Rhythm: Normal rate and regular rhythm.     Pulses: Normal pulses.     Heart sounds: Normal heart sounds.  Pulmonary:     Effort: Pulmonary effort is normal.     Breath sounds: Normal breath sounds. No wheezing.  Musculoskeletal:        General: Normal range of motion.     Cervical back: Normal range of motion and neck supple.  Skin:    General: Skin is warm and dry.  Neurological:     General: No focal deficit present.     Mental Status: She is alert and oriented to person, place, and time. Mental status is at baseline.  Psychiatric:        Mood and Affect: Mood normal.        Behavior: Behavior normal.        Thought Content: Thought content normal.        Judgment: Judgment normal.     BP (!) 155/105 (BP Location: Right Arm, Patient Position: Sitting, Cuff Size: Normal)   Pulse 89   Temp (!) 97.2 F (36.2 C) (Oral)   Resp 18   Ht 5\' 1"  (1.549 m)   Wt 130 lb (59 kg)   SpO2 100%   BMI 24.56 kg/m  Wt Readings from Last 3 Encounters:  04/05/20 130 lb (59 kg)  09/14/19 139 lb (63 kg)  08/10/19 129 lb (58.5 kg)     Health Maintenance Due  Topic Date Due  . COVID-19 Vaccine (3 - Booster) 03/17/2020    There are no preventive care reminders to display for this patient.  Lab Results  Component Value Date   TSH 1.310 04/05/2020   Lab Results  Component Value Date   WBC  9.2 04/05/2020   HGB 14.9 04/05/2020   HCT 44.9 04/05/2020   MCV 93 04/05/2020   PLT 444 04/05/2020   Lab Results  Component Value Date   NA 137 04/05/2020   K 4.5 04/05/2020   CO2 23 05/12/2019   GLUCOSE 91 04/05/2020   BUN 13 04/05/2020   CREATININE 0.92 04/05/2020   BILITOT 0.2 04/05/2020   ALKPHOS 81 04/05/2020   AST 15 04/05/2020   ALT 23 05/12/2019   PROT 7.0 04/05/2020   ALBUMIN 4.4 04/05/2020   CALCIUM 9.8 04/05/2020   ANIONGAP 9 10/28/2016   GFR 77.07 07/07/2018   Lab Results  Component Value Date   CHOL  168 08/10/2019   Lab Results  Component Value Date   HDL 71 08/10/2019   Lab Results  Component Value Date   LDLCALC 79 08/10/2019   Lab Results  Component Value Date   TRIG 103 08/10/2019   Lab Results  Component Value Date   CHOLHDL 2.4 08/10/2019   Lab Results  Component Value Date   HGBA1C 5.6 06/22/2018      Assessment & Plan:   Problem List Items Addressed This Visit      Respiratory   Asthma exacerbation - Primary   Relevant Orders   POC COVID-19 (Completed)   Influenza A/B (Completed)   DG Chest 2 View (Completed)    Other Visit Diagnoses    Elevated blood pressure reading without diagnosis of hypertension       Other chest pain       Relevant Orders   DG Chest 2 View (Completed)   EKG 12-Lead (Completed)   Acute non intractable tension-type headache       Relevant Orders   CBC with Differential/Platelet (Completed)   Comp. Metabolic Panel (12) (Completed)   TSH (Completed)   Need for immunization against influenza       Relevant Orders   Flu Vaccine QUAD 36+ mos IM (Completed)   Abnormal finding on chest xray       Relevant Orders   CT Chest W Contrast    1. Moderate asthma with exacerbation, unspecified whether persistent Chest x-ray completed today, will forward results to ordering provider.  Rapid Covid negative, flu test negative  Chest x-ray resulted prior to note being closed, CT chest ordered for further evaluation   ADDENDUM: Compression fracture noted on the initial report is present on the study of December 14, 2019 perhaps with slight increased loss of height. Correlate with any pain in this area to suggest worsening.  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 CT of the chest is suggested for further assessment. Findings discussed with the provider as outlined below.  These results were called by  telephone at the time of interpretation on 04/05/2020 at 4:17 pm to provider [facility] , who verbally acknowledged these results.   Electronically Signed   By: Zetta Bills M.D.   On: 04/05/2020 16:17  - POC COVID-19 - Influenza A/B - DG Chest 2 View  2. Elevated blood pressure reading without diagnosis of hypertension Encourage patient to check blood pressure at home, keep a written log, and bring that to follow-up appointment for further evaluation.  Patient encouraged to increase hydration  3. Other chest pain EKG showed sinus rhythm, questionable short PR syndrome, more than likely interference - DG Chest 2 View - EKG 12-Lead  4. Acute non  intractable tension-type headache Encourage patient to increase hydration - CBC with Differential/Platelet - Comp. Metabolic Panel (12) - TSH  5. Need for immunization against influenza  - Flu Vaccine QUAD 36+ mos IM   I have reviewed the patient's medical history (PMH, PSH, Social History, Family History, Medications, and allergies) , and have been updated if relevant. I spent 30 minutes reviewing chart and  face to face time with patient.    No orders of the defined types were placed in this encounter.   Follow-up: Return in about 2 weeks (around 04/19/2020).    Loraine Grip Randeep Biondolillo, PA-C

## 2020-04-06 ENCOUNTER — Encounter: Payer: Self-pay | Admitting: Physician Assistant

## 2020-04-06 LAB — COMP. METABOLIC PANEL (12)
AST: 15 IU/L (ref 0–40)
Albumin/Globulin Ratio: 1.7 (ref 1.2–2.2)
Albumin: 4.4 g/dL (ref 3.8–4.9)
Alkaline Phosphatase: 81 IU/L (ref 44–121)
BUN/Creatinine Ratio: 14 (ref 9–23)
BUN: 13 mg/dL (ref 6–24)
Bilirubin Total: 0.2 mg/dL (ref 0.0–1.2)
Calcium: 9.8 mg/dL (ref 8.7–10.2)
Chloride: 100 mmol/L (ref 96–106)
Creatinine, Ser: 0.92 mg/dL (ref 0.57–1.00)
GFR calc Af Amer: 81 mL/min/{1.73_m2} (ref >59)
GFR calc non Af Amer: 70 mL/min/{1.73_m2} (ref >59)
Globulin, Total: 2.6 g/dL (ref 1.5–4.5)
Glucose: 91 mg/dL (ref 65–99)
Potassium: 4.5 mmol/L (ref 3.5–5.2)
Sodium: 137 mmol/L (ref 134–144)
Total Protein: 7 g/dL (ref 6.0–8.5)

## 2020-04-06 LAB — CBC WITH DIFFERENTIAL/PLATELET
Basophils Absolute: 0.1 10*3/uL (ref 0.0–0.2)
Basos: 1 %
EOS (ABSOLUTE): 0.1 10*3/uL (ref 0.0–0.4)
Eos: 2 %
Hematocrit: 44.9 % (ref 34.0–46.6)
Hemoglobin: 14.9 g/dL (ref 11.1–15.9)
Immature Grans (Abs): 0 10*3/uL (ref 0.0–0.1)
Immature Granulocytes: 0 %
Lymphocytes Absolute: 2.5 10*3/uL (ref 0.7–3.1)
Lymphs: 27 %
MCH: 30.8 pg (ref 26.6–33.0)
MCHC: 33.2 g/dL (ref 31.5–35.7)
MCV: 93 fL (ref 79–97)
Monocytes Absolute: 0.6 10*3/uL (ref 0.1–0.9)
Monocytes: 6 %
Neutrophils Absolute: 6 10*3/uL (ref 1.4–7.0)
Neutrophils: 64 %
Platelets: 444 10*3/uL (ref 150–450)
RBC: 4.83 x10E6/uL (ref 3.77–5.28)
RDW: 13.9 % (ref 11.7–15.4)
WBC: 9.2 10*3/uL (ref 3.4–10.8)

## 2020-04-06 LAB — TSH: TSH: 1.31 u[IU]/mL (ref 0.450–4.500)

## 2020-04-06 NOTE — Telephone Encounter (Signed)
Noted  

## 2020-04-07 DIAGNOSIS — R0789 Other chest pain: Secondary | ICD-10-CM | POA: Insufficient documentation

## 2020-04-07 DIAGNOSIS — G44209 Tension-type headache, unspecified, not intractable: Secondary | ICD-10-CM | POA: Insufficient documentation

## 2020-04-09 ENCOUNTER — Telehealth: Payer: Self-pay | Admitting: *Deleted

## 2020-04-09 NOTE — Telephone Encounter (Signed)
-----   Message from Kennieth Rad, [place] sent at 04/07/2020  2:07 PM EST ----- Please call patient and let her know that her kidney and liver function as well as thyroid function were within normal limits, she does not show any signs of anemia

## 2020-04-09 NOTE — Telephone Encounter (Signed)
Patient verified DOB Patient is aware of labs showing normal results. Patient is aware of CT being scheduled for 2/11 at 1:15pm arrival. Patient aware of only having liquids 4 hours prior to imaging.

## 2020-04-10 DIAGNOSIS — S32010A Wedge compression fracture of first lumbar vertebra, initial encounter for closed fracture: Secondary | ICD-10-CM | POA: Diagnosis not present

## 2020-04-13 ENCOUNTER — Other Ambulatory Visit: Payer: Self-pay

## 2020-04-13 ENCOUNTER — Ambulatory Visit (HOSPITAL_COMMUNITY)
Admission: RE | Admit: 2020-04-13 | Discharge: 2020-04-13 | Disposition: A | Discharge status: Home / Self Care | Payer: Medicare HMO | Source: Ambulatory Visit | Attending: Physician Assistant | Admitting: Physician Assistant

## 2020-04-13 ENCOUNTER — Encounter (HOSPITAL_COMMUNITY): Payer: Self-pay

## 2020-04-13 DIAGNOSIS — N644 Mastodynia: Secondary | ICD-10-CM | POA: Diagnosis not present

## 2020-04-13 DIAGNOSIS — R9389 Abnormal findings on diagnostic imaging of other specified body structures: Secondary | ICD-10-CM | POA: Insufficient documentation

## 2020-04-13 DIAGNOSIS — S22088A Other fracture of T11-T12 vertebra, initial encounter for closed fracture: Secondary | ICD-10-CM | POA: Diagnosis not present

## 2020-04-13 DIAGNOSIS — S22089A Unspecified fracture of T11-T12 vertebra, initial encounter for closed fracture: Secondary | ICD-10-CM | POA: Diagnosis not present

## 2020-04-13 DIAGNOSIS — S2232XA Fracture of one rib, left side, initial encounter for closed fracture: Secondary | ICD-10-CM | POA: Diagnosis not present

## 2020-04-13 MED ORDER — IOHEXOL 300 MG/ML  SOLN
75.0000 mL | Freq: Once | INTRAMUSCULAR | Status: AC | PRN
  Administered 2020-04-13: 75 mL via INTRAVENOUS

## 2020-04-17 ENCOUNTER — Encounter: Payer: Self-pay | Admitting: Internal Medicine

## 2020-04-17 ENCOUNTER — Ambulatory Visit (INDEPENDENT_AMBULATORY_CARE_PROVIDER_SITE_OTHER): Payer: Medicare HMO | Admitting: Internal Medicine

## 2020-04-17 ENCOUNTER — Other Ambulatory Visit: Payer: Self-pay

## 2020-04-17 VITALS — BP 126/84 | HR 74 | Temp 97.3°F | Resp 16 | Ht 61.0 in | Wt 135.0 lb

## 2020-04-17 DIAGNOSIS — Z0000 Encounter for general adult medical examination without abnormal findings: Secondary | ICD-10-CM | POA: Diagnosis not present

## 2020-04-17 NOTE — Progress Notes (Signed)
Subjective:    [name] - 55 y.o. female MRN [id number]  Date of birth: 06-Mar-1965  HPI  [name] is here for annual exam.  Depression screen [facility] 2/9 04/17/2020 06/22/2018 05/21/2017  Decreased Interest 0 0 0  Down, Depressed, Hopeless 0 0 0  PHQ - 2 Score 0 0 0   MMSE - Mini Mental State Exam 04/17/2020  Orientation to time 5  Orientation to Place 5  Registration 3  Attention/ Calculation 5  Recall 3  Language- name 2 objects 2  Language- repeat 1  Language- follow 3 step command 3  Language- read & follow direction 1  Write a sentence 1  Copy design 1  Total score 30   Fall Risk  04/17/2020 08/10/2019 05/21/2017 09/12/2015 09/12/2014  Falls in the past year? 1 0 Yes No Yes  Number falls in past yr: 1 0 1 - 1  Injury with Fall? 1 0 - - No  Risk for fall due to : History of fall(s) Medication side effect Impaired balance/gait - -  Follow up - - - - Education provided    Health Maintenance:  Health Maintenance Due  Topic Date Due  . COVID-19 Vaccine (3 - Booster) 03/17/2020    -  reports that she quit smoking about 24 years ago. Her smoking use included cigarettes. She has a 1.00 pack-year smoking history. She has never used smokeless tobacco. - Review of Systems: Per HPI. - Past Medical History: Patient Active Problem List   Diagnosis Date Noted  . Other chest pain 04/07/2020  . Acute non intractable tension-type headache 04/07/2020  . Medial epicondylitis of left elbow 07/19/2019  . Pain in left hip 07/19/2019  . Asthma exacerbation 11/22/2018  . GERD (gastroesophageal reflux disease) 06/22/2018  . Cannabis use disorder, moderate, dependence ([place]) 04/22/2018  . Insomnia 04/15/2018  . Elevated blood pressure reading without diagnosis of hypertension 06/13/2017  . GAD (generalized anxiety disorder) 05/23/2017  . OSA (obstructive sleep apnea) 01/09/2016  . Asthma 11/21/2015  . Chronic pain syndrome 11/21/2015  . HLD (hyperlipidemia) 09/12/2014  . Bipolar 1  disorder, depressed, partial remission ([place]) 09/12/2014   - Medications: reviewed and updated   Objective:   Physical Exam BP 126/84 (BP Location: Right Arm, Patient Position: Sitting, Cuff Size: Normal)   Pulse 74   Temp (!) 97.3 F (36.3 C) (Temporal)   Resp 16   Ht 5\' 1"  (1.549 m)   Wt 135 lb (61.2 kg)   SpO2 98%   BMI 25.51 kg/m  Physical Exam Constitutional:      Appearance: She is not diaphoretic.  HENT:     Head: Normocephalic and atraumatic.     Mouth/Throat:     Mouth: Oropharynx is clear and moist.      Comments: TMs normal bilaterally Eyes:     Extraocular Movements: EOM normal.     Conjunctiva/sclera: Conjunctivae normal.     Pupils: Pupils are equal, round, and reactive to light.  Neck:     Thyroid: No thyromegaly.  Cardiovascular:     Rate and Rhythm: Normal rate and regular rhythm.     Pulses: Intact distal pulses.     Heart sounds: Normal heart sounds. No murmur heard.   Pulmonary:     Effort: Pulmonary effort is normal. No respiratory distress.     Breath sounds: Normal breath sounds. No wheezing.  Abdominal:     General: Bowel sounds are normal. There is no distension.     Palpations:  Abdomen is soft.     Tenderness: There is no abdominal tenderness. There is no guarding or rebound.  Musculoskeletal:        General: No deformity or edema. Normal range of motion.     Cervical back: Normal range of motion and neck supple.  Lymphadenopathy:     Cervical: No cervical adenopathy.  Skin:    General: Skin is warm and dry.     Findings: No rash.  Neurological:     Mental Status: She is alert and oriented to person, place, and time.     Gait: Gait is intact.  Psychiatric:        Mood and Affect: Mood and affect normal.        Judgment: Judgment normal.            Assessment & Plan:   1. Encounter for annual physical exam Counseled on 150 minutes of exercise per week, healthy eating (including decreased daily intake of saturated fats,  cholesterol, added sugars, sodium), STI prevention, routine healthcare maintenance.   Phill Myron, D.O. 04/17/2020, 10:12 AM Primary Care at [facility]

## 2020-04-18 DIAGNOSIS — S32010A Wedge compression fracture of first lumbar vertebra, initial encounter for closed fracture: Secondary | ICD-10-CM | POA: Diagnosis not present

## 2020-04-19 ENCOUNTER — Telehealth: Payer: Self-pay | Admitting: Allergy

## 2020-04-19 ENCOUNTER — Ambulatory Visit: Payer: Medicare HMO | Admitting: Physician Assistant

## 2020-04-19 VITALS — BP 116/75 | HR 83 | Resp 16

## 2020-04-19 DIAGNOSIS — K449 Diaphragmatic hernia without obstruction or gangrene: Secondary | ICD-10-CM | POA: Diagnosis not present

## 2020-04-19 DIAGNOSIS — K219 Gastro-esophageal reflux disease without esophagitis: Secondary | ICD-10-CM

## 2020-04-19 DIAGNOSIS — R112 Nausea with vomiting, unspecified: Secondary | ICD-10-CM | POA: Diagnosis not present

## 2020-04-19 DIAGNOSIS — R030 Elevated blood-pressure reading, without diagnosis of hypertension: Secondary | ICD-10-CM

## 2020-04-19 MED ORDER — PANTOPRAZOLE SODIUM 40 MG PO TBEC: 40.0000 mg | DELAYED_RELEASE_TABLET | Freq: Every day | ORAL | 3 refills | Status: DC

## 2020-04-19 MED ORDER — ONDANSETRON 8 MG PO TBDP: 8.0000 mg | ORAL_TABLET | Freq: Three times a day (TID) | ORAL | 0 refills | Status: DC | PRN

## 2020-04-19 NOTE — Progress Notes (Signed)
Established Patient Office Visit  Subjective:  Patient ID: [name], female    DOB: [birth year]  Age: 55 y.o. MRN: [id number]  CC:  Chief Complaint  Patient presents with  . Blood Pressure Check    HPI [name] reports that she has been checking her blood pressure at home, states that she has not had any elevated readings.  Reports today that she does suffer from heartburn and acid reflux, states that she will experience nausea and vomiting a couple of times a month.  Reports it generally happens after eating, will vomit food.  Reports that she has had an upper endoscopy in 2019 for same complaint, does not take anything for relief.  Reports that she does not use NSAIDs.    Past Medical History:  Diagnosis Date  . Anxiety   . Arthritis    neck, knees, shoulders  . Asthma   . Bipolar disorder ([place])    currently feeling MANIC- 10/02/2016  . Depression   . GERD (gastroesophageal reflux disease)   . Hip fracture ([place]) 06/2019  . History of blood transfusion    as a newborn   . History of lump of left breast   . Hyperlipidemia   . Lumbar pseudoarthrosis   . Motion sickness    cars  . OSA (obstructive sleep apnea) 01/09/2016   can't afford CPAP  . Personality disorder ([place])   . PONV (postoperative nausea and vomiting)   . Post traumatic stress disorder (PTSD)   . Substance abuse ([place])   . Synovial cyst     Past Surgical History:  Procedure Laterality Date  . COLONOSCOPY WITH PROPOFOL N/A 11/04/2017   Procedure: COLONOSCOPY WITH PROPOFOL;  Surgeon: Lin Landsman, MD;  Location: [facility];  Service: Endoscopy;  Laterality: N/A;  . ESOPHAGOGASTRODUODENOSCOPY (EGD) WITH PROPOFOL N/A 11/04/2017   Procedure: ESOPHAGOGASTRODUODENOSCOPY (EGD) WITH PROPOFOL with biopsies;  Surgeon: Lin Landsman, MD;  Location: [facility];  Service: Endoscopy;  Laterality: N/A;  sleep apnea  . KNEE SURGERY Left    x5, post basketball injury  . LUMBAR  LAMINECTOMY/DECOMPRESSION MICRODISCECTOMY Left 01/22/2016   Procedure: Laminectomy for facet/synovial cyst - left - Lumbar four - lumbar five;  Surgeon: Earnie Larsson, MD;  Location: [facility];  Service: Neurosurgery;  Laterality: Left;  Laminectomy for facet/synovial cyst - left - Lumbar four - lumbar five  . POLYPECTOMY N/A 11/04/2017   Procedure: POLYPECTOMY INTESTINAL;  Surgeon: Lin Landsman, MD;  Location: [facility];  Service: Endoscopy;  Laterality: N/A;  . SHOULDER SURGERY Left    x2  . SPINE SURGERY N/A    Phreesia 08/07/2019  . TOE SURGERY Bilateral    bone spurs    Family History  Problem Relation Age of Onset  . Heart disease Father   . Hyperlipidemia Father   . Alcohol abuse Brother   . Alcohol abuse Paternal Uncle   . Breast cancer Maternal Aunt        70's  . Colon cancer Neg Hx     Social History   Socioeconomic History  . Marital status: Single    Spouse name: Not on file  . Number of children: 0  . Years of education: Not on file  . Highest education level: Bachelor's degree (e.g., BA, AB, BS)  Occupational History  . Not on file  Tobacco Use  . Smoking status: Former Smoker    Packs/day: 1.00    Years: 1.00    Pack years:  1.00    Types: Cigarettes    Quit date: 04/15/1996    Years since quitting: 24.0  . Smokeless tobacco: Never Used  . Tobacco comment: started back again in 2016, smoked for about 6 mo and then quit  Vaping Use  . Vaping Use: Never used  Substance and Sexual Activity  . Alcohol use: No    Alcohol/week: 0.0 standard drinks    Comment: quit 20 years  . Drug use: Yes    Frequency: 14.0 times per week    Types: Marijuana    Comment: 20 yrs. ago- cocaine   . Sexual activity: Not Currently  Other Topics Concern  . Not on file  Social History Narrative  . Not on file   Social Determinants of Health   Financial Resource Strain: Not on file  Food Insecurity: Not on file  Transportation Needs: Not on file  Physical  Activity: Not on file  Stress: Not on file  Social Connections: Not on file  Intimate Partner Violence: Not on file    Outpatient Medications Prior to Visit  Medication Sig Dispense Refill  . albuterol (VENTOLIN HFA) 108 (90 Base) MCG/ACT inhaler 2 puffs every 4-6 hours as needed 18 g 1  . aspirin 81 MG tablet Take 81 mg by mouth daily.    Marland Kitchen azelastine (ASTELIN) 0.1 % nasal spray PLACE 2 SPRAYS INTO BOTH NOSTRILS 2 (TWO) TIMES DAILY. 30 mL 5  . buPROPion (WELLBUTRIN XL) 300 MG 24 hr tablet Take 1 tablet (300 mg total) by mouth daily. 90 tablet 1  . calcium-vitamin D (OSCAL WITH D) 500-200 MG-UNIT tablet Take 1 tablet by mouth 3 (three) times daily. 90 tablet 6  . cyclobenzaprine (FLEXERIL) 10 MG tablet Take 10 mg by mouth 3 (three) times daily as needed for muscle spasms.    . fluticasone (FLONASE) 50 MCG/ACT nasal spray Place 2 sprays into both nostrils daily. 16 g 6  . Fluticasone-Salmeterol (WIXELA INHUB) 100-50 MCG/DOSE AEPB Inhale 1 puff into the lungs in the morning and at bedtime. MUST SCHEDULE PHYSICAL 60 each 0  . Fluticasone-Salmeterol (WIXELA INHUB) 250-50 MCG/DOSE AEPB Inhale 1 puff twice a day to help prevent cough and wheeze. 60 each 3  . gabapentin (NEURONTIN) 300 MG capsule Take 1 capsule by mouth 3 (three) times daily.    . meloxicam (MOBIC) 7.5 MG tablet Take 1 tablet (7.5 mg total) by mouth 2 (two) times daily as needed for pain. 30 tablet 2  . montelukast (SINGULAIR) 10 MG tablet Take 1 tablet (10 mg total) by mouth at bedtime. 30 tablet 5  . Olopatadine HCl 0.2 % SOLN PLACE 1 DROP INTO BOTH EYES DAILY AS NEEDED. 7.5 mL 1  . simvastatin (ZOCOR) 20 MG tablet Take 1 tablet (20 mg total) by mouth daily at 6 PM. 90 tablet 0  . triamcinolone (NASACORT) 55 MCG/ACT AERO nasal inhaler     . zonisamide (ZONEGRAN) 100 MG capsule Take 1 capsule (100 mg total) by mouth at bedtime. 90 capsule 1  . trazodone (DESYREL) 300 MG tablet Take 1 tablet (300 mg total) by mouth at bedtime as  needed for sleep. 30 tablet 2  . tiZANidine (ZANAFLEX) 4 MG tablet  (Patient not taking: Reported on 04/17/2020)     No facility-administered medications prior to visit.    Allergies  Allergen Reactions  . Other   . Effexor [Venlafaxine] Other (See Comments)    UNSPECIFIED REACTION, headaches, felt funny, withdrawal with missed dose   . Lamictal [Lamotrigine]  Rash    ROS Review of Systems  Constitutional: Negative for chills and fever.  HENT: Negative.   Eyes: Negative.   Respiratory: Positive for shortness of breath.   Cardiovascular: Negative.   Gastrointestinal: Positive for nausea and vomiting. Negative for abdominal pain.  Endocrine: Negative.   Genitourinary: Negative.   Musculoskeletal: Positive for back pain and neck pain.  Skin: Negative.   Allergic/Immunologic: Negative.   Neurological: Negative.   Hematological: Negative.       Objective:    Physical Exam Vitals and nursing note reviewed.  Constitutional:      General: She is not in acute distress.    Appearance: Normal appearance. She is not ill-appearing.  HENT:     Head: Normocephalic and atraumatic.     Right Ear: External ear normal.     Left Ear: External ear normal.     Mouth/Throat:     Mouth: Mucous membranes are moist.     Pharynx: Oropharynx is clear.  Eyes:     Conjunctiva/sclera: Conjunctivae normal.     Pupils: Pupils are equal, round, and reactive to light.  Cardiovascular:     Pulses: Normal pulses.     Heart sounds: Normal heart sounds.  Pulmonary:     Effort: Pulmonary effort is normal.     Breath sounds: Normal breath sounds.  Abdominal:     General: Abdomen is flat.     Palpations: Abdomen is soft.     Tenderness: There is no abdominal tenderness.  Musculoskeletal:        General: Normal range of motion.     Cervical back: Normal range of motion and neck supple.  Skin:    General: Skin is warm and dry.  Neurological:     General: No focal deficit present.     Mental  Status: She is alert and oriented to person, place, and time.  Psychiatric:        Mood and Affect: Mood normal.        Behavior: Behavior normal.        Thought Content: Thought content normal.        Judgment: Judgment normal.     BP 116/75 (BP Location: Left Arm, Cuff Size: Normal)   Pulse 83   Resp 16   SpO2 100%  Wt Readings from Last 3 Encounters:  04/17/20 135 lb (61.2 kg)  04/05/20 130 lb (59 kg)  09/14/19 139 lb (63 kg)     Health Maintenance Due  Topic Date Due  . COVID-19 Vaccine (3 - Booster) 03/17/2020    There are no preventive care reminders to display for this patient.  Lab Results  Component Value Date   TSH 1.310 04/05/2020   Lab Results  Component Value Date   WBC 9.2 04/05/2020   HGB 14.9 04/05/2020   HCT 44.9 04/05/2020   MCV 93 04/05/2020   PLT 444 04/05/2020   Lab Results  Component Value Date   NA 137 04/05/2020   K 4.5 04/05/2020   CO2 23 05/12/2019   GLUCOSE 91 04/05/2020   BUN 13 04/05/2020   CREATININE 0.92 04/05/2020   BILITOT 0.2 04/05/2020   ALKPHOS 81 04/05/2020   AST 15 04/05/2020   ALT 23 05/12/2019   PROT 7.0 04/05/2020   ALBUMIN 4.4 04/05/2020   CALCIUM 9.8 04/05/2020   ANIONGAP 9 10/28/2016   GFR 77.07 07/07/2018   Lab Results  Component Value Date   CHOL 168 08/10/2019   Lab Results  Component Value  Date   HDL 71 08/10/2019   Lab Results  Component Value Date   LDLCALC 79 08/10/2019   Lab Results  Component Value Date   TRIG 103 08/10/2019   Lab Results  Component Value Date   CHOLHDL 2.4 08/10/2019   Lab Results  Component Value Date   HGBA1C 5.6 06/22/2018      Assessment & Plan:   Problem List Items Addressed This Visit      Digestive   GERD (gastroesophageal reflux disease) - Primary   Relevant Medications   pantoprazole (PROTONIX) 40 MG tablet   ondansetron (ZOFRAN ODT) 8 MG disintegrating tablet     Other   Elevated blood pressure reading without diagnosis of hypertension     Other Visit Diagnoses    Hiatal hernia       Relevant Medications   pantoprazole (PROTONIX) 40 MG tablet   Non-intractable vomiting with nausea, unspecified vomiting type       Relevant Medications   ondansetron (ZOFRAN ODT) 8 MG disintegrating tablet    1. Gastroesophageal reflux disease without esophagitis Trial Protonix, patient education given on GERD lifestyle modifications - pantoprazole (PROTONIX) 40 MG tablet; Take 1 tablet (40 mg total) by mouth daily.  Dispense: 30 tablet; Refill: 3  2. Hiatal hernia Patient had CT of chest completed due to abnormal findings on chest x-ray that was completed on patient's behalf.  Chest x-ray had previously been ordered by asthma provider.  Hiatal hernia was present on upper endoscopy in September 2019. - pantoprazole (PROTONIX) 40 MG tablet; Take 1 tablet (40 mg total) by mouth daily.  Dispense: 30 tablet; Refill: 3  3. Non-intractable vomiting with nausea, unspecified vomiting type Trial Zofran - ondansetron (ZOFRAN ODT) 8 MG disintegrating tablet; Take 1 tablet (8 mg total) by mouth every 8 (eight) hours as needed for nausea or vomiting.  Dispense: 20 tablet; Refill: 0  4.  Elevated blood pressure without diagnosis of hypertension Encouraged patient to continue checking blood pressure at home, follow-up with primary care provider if blood pressures begin to become elevated.  Patient encouraged to follow-up with asthma specialist.  Patient understands and agrees  Meds ordered this encounter  Medications  . pantoprazole (PROTONIX) 40 MG tablet    Sig: Take 1 tablet (40 mg total) by mouth daily.    Dispense:  30 tablet    Refill:  3    Order Specific Question:   Supervising Provider    Answer:   Joya Gaskins, PATRICK E [[id number]]  . ondansetron (ZOFRAN ODT) 8 MG disintegrating tablet    Sig: Take 1 tablet (8 mg total) by mouth every 8 (eight) hours as needed for nausea or vomiting.    Dispense:  20 tablet    Refill:  0    Order Specific  Question:   Supervising Provider    Answer:   Elsie Stain [[id number]]   I have reviewed the patient's medical history (PMH, PSH, Social History, Family History, Medications, and allergies) , and have been updated if relevant. I spent 30 minutes reviewing chart and  face to face time with patient.     Follow-up: Return if symptoms worsen or fail to improve.    Loraine Grip Angelos Wasco, PA-C

## 2020-04-19 NOTE — Telephone Encounter (Signed)
FYI

## 2020-04-19 NOTE — Telephone Encounter (Signed)
Patient called to let Dr. Nelva Bush know that she went and had x-ray and scan done on 02/11.. [phone].

## 2020-04-19 NOTE — Progress Notes (Signed)
Follow up.

## 2020-04-19 NOTE — Patient Instructions (Signed)
Continue to check your blood pressure at home, if blood pressures begin to be elevated, please feel free to return to the mobile medicine unit or follow-up with your primary care provider.  You will start Protonix once daily in the morning for stomach protection, you will use Zofran as needed for nausea and vomiting.  Kennieth Rad, PA-C Physician Assistant [facility] [email or web address]    Hiatal Hernia  A hiatal hernia occurs when part of the stomach slides above the muscle that separates the abdomen from the chest (diaphragm). A person can be born with a hiatal hernia (congenital), or it may develop over time. In almost all cases of hiatal hernia, only the top part of the stomach pushes through the diaphragm. Many people have a hiatal hernia with no symptoms. The larger the hernia, the more likely it is that you will have symptoms. In some cases, a hiatal hernia allows stomach acid to flow back into the tube that carries food from your mouth to your stomach (esophagus). This may cause heartburn symptoms. Severe heartburn symptoms may mean that you have developed a condition called gastroesophageal reflux disease (GERD). What are the causes? This condition is caused by a weakness in the opening (hiatus) where the esophagus passes through the diaphragm to attach to the upper part of the stomach. A person may be born with a weakness in the hiatus, or a weakness can develop over time. What increases the risk? This condition is more likely to develop in:  Older people. Age is a major risk factor for a hiatal hernia, especially if you are over the age of 1.  Pregnant women.  People who are overweight.  People who have frequent constipation. What are the signs or symptoms? Symptoms of this condition usually develop in the form of GERD symptoms. Symptoms include:  Heartburn.  Belching.  Indigestion.  Trouble  swallowing.  Coughing or wheezing.  Sore throat.  Hoarseness.  Chest pain.  Nausea and vomiting. How is this diagnosed? This condition may be diagnosed during testing for GERD. Tests that may be done include:  X-rays of your stomach or chest.  An upper gastrointestinal (GI) series. This is an X-ray exam of your GI tract that is taken after you swallow a chalky liquid that shows up clearly on the X-ray.  Endoscopy. This is a procedure to look into your stomach using a thin, flexible tube that has a tiny camera and light on the end of it. How is this treated? This condition may be treated by:  Dietary and lifestyle changes to help reduce GERD symptoms.  Medicines. These may include: ? Over-the-counter antacids. ? Medicines that make your stomach empty more quickly. ? Medicines that block the production of stomach acid (H2 blockers). ? Stronger medicines to reduce stomach acid (proton pump inhibitors).  Surgery to repair the hernia, if other treatments are not helping. If you have no symptoms, you may not need treatment. Follow these instructions at home: Lifestyle and activity  Do not use any products that contain nicotine or tobacco, such as cigarettes and e-cigarettes. If you need help quitting, ask your health care provider.  Try to achieve and maintain a healthy body weight.  Avoid putting pressure on your abdomen. Anything that puts pressure on your abdomen increases the amount of acid that may be pushed up into your esophagus. ? Avoid bending over, especially after eating. ? Raise the head of your bed by putting blocks under the legs. This keeps your head  and esophagus higher than your stomach. ? Do not wear tight clothing around your chest or stomach. ? Try not to strain when having a bowel movement, when urinating, or when lifting heavy objects. Eating and drinking  Avoid foods that can worsen GERD symptoms. These may include: ? Fatty foods, like fried  foods. ? Citrus fruits, like oranges or lemon. ? Other foods and drinks that contain acid, like orange juice or tomatoes. ? Spicy food. ? Chocolate.  Eat frequent small meals instead of three large meals a day. This helps prevent your stomach from getting too full. ? Eat slowly. ? Do not lie down right after eating. ? Do not eat 1-2 hours before bed.  Do not drink beverages with caffeine. These include cola, coffee, cocoa, and tea.  Do not drink alcohol. General instructions  Take over-the-counter and prescription medicines only as told by your health care provider.  Keep all follow-up visits as told by your health care provider. This is important. Contact a health care provider if:  Your symptoms are not controlled with medicines or lifestyle changes.  You are having trouble swallowing.  You have coughing or wheezing that will not go away. Get help right away if:  Your pain is getting worse.  Your pain spreads to your arms, neck, jaw, teeth, or back.  You have shortness of breath.  You sweat for no reason.  You feel sick to your stomach (nauseous) or you vomit.  You vomit blood.  You have bright red blood in your stools.  You have black, tarry stools. This information is not intended to replace advice given to you by your health care provider. Make sure you discuss any questions you have with your health care provider. Document Revised: 01/30/2017 Document Reviewed: 09/22/2016 Elsevier Patient Education  [place].

## 2020-04-21 ENCOUNTER — Other Ambulatory Visit: Payer: Self-pay | Admitting: Allergy

## 2020-04-21 DIAGNOSIS — K449 Diaphragmatic hernia without obstruction or gangrene: Secondary | ICD-10-CM | POA: Insufficient documentation

## 2020-04-22 ENCOUNTER — Other Ambulatory Visit: Payer: Self-pay | Admitting: Allergy

## 2020-04-25 ENCOUNTER — Other Ambulatory Visit: Payer: Self-pay

## 2020-04-25 ENCOUNTER — Ambulatory Visit (INDEPENDENT_AMBULATORY_CARE_PROVIDER_SITE_OTHER): Payer: Medicare HMO | Admitting: Neurology

## 2020-04-25 DIAGNOSIS — M5412 Radiculopathy, cervical region: Secondary | ICD-10-CM

## 2020-04-25 DIAGNOSIS — R202 Paresthesia of skin: Secondary | ICD-10-CM | POA: Diagnosis not present

## 2020-04-25 NOTE — Procedures (Signed)
[facility]  [address], [address]  [place], [place] [address] Tel: [phone] Fax:  [phone] Test Date:  04/25/2020  Patient: [name] DOB: [birth year] Physician: Narda Amber, DO  Sex: Female Height: 5\' 1"  Ref Phys: Faythe Casa, MD  ID#: [id number]   Technician:    Patient Complaints: This is a 55 year old female referred for evaluation of bilateral arm pain and paresthesias, worse on the left.  NCV & EMG Findings: Extensive electrodiagnostic testing of the left upper extremity and additional studies of the right shows:  1. Bilateral median, ulnar, and mixed palmar sensory responses are within normal limits. 2. Bilateral median and ulnar motor responses are within normal limits. 3. Chronic motor axonal loss changes are seen affecting the left deltoid and biceps muscles, without accompanying active denervation.  These findings are not present in the right upper extremity.  Impression: 1. Chronic C5-6 radiculopathy affecting the left upper extremity, mild-to-moderate. 2. There is no evidence of carpal tunnel syndrome or ulnar neuropathy affecting either upper extremity.   ___________________________ Narda Amber, DO    Nerve Conduction Studies Anti Sensory Summary Table   Stim Site NR Peak (ms) Norm Peak (ms) P-T Amp (V) Norm P-T Amp  Left Median Anti Sensory (2nd Digit)  34C  Wrist    3.0 <3.6 37.6 >15  Right Median Anti Sensory (2nd Digit)  34C  Wrist    3.1 <3.6 36.8 >15  Left Ulnar Anti Sensory (5th Digit)  34C  Wrist    2.7 <3.1 37.7 >10  Right Ulnar Anti Sensory (5th Digit)  34C  Wrist    2.4 <3.1 32.2 >10   Motor Summary Table   Stim Site NR Onset (ms) Norm Onset (ms) O-P Amp (mV) Norm O-P Amp Site1 Site2 Delta-0 (ms) Dist (cm) Vel (m/s) Norm Vel (m/s)  Left Median Motor (Abd Poll Brev)  34C  Wrist    2.9 <4.0 11.5 >6 Elbow Wrist 4.4 28.0 64 >50  Elbow    7.3  10.7         Right Median Motor (Abd Poll Brev)  34C  Wrist    3.4  <4.0 11.0 >6 Elbow Wrist 4.6 26.0 57 >50  Elbow    8.0  9.6         Left Ulnar Motor (Abd Dig Minimi)  34C  Wrist    2.2 <3.1 16.9 >7 B Elbow Wrist 3.1 20.0 65 >50  B Elbow    5.3  15.3  A Elbow B Elbow 1.6 10.0 62 >50  A Elbow    6.9  14.8         Right Ulnar Motor (Abd Dig Minimi)  34C  Wrist    2.2 <3.1 12.3 >7 B Elbow Wrist 3.1 21.0 68 >50  B Elbow    5.3  11.4  A Elbow B Elbow 1.5 10.0 67 >50  A Elbow    6.8  10.4          Comparison Summary Table   Stim Site NR Peak (ms) Norm Peak (ms) P-T Amp (V) Site1 Site2 Delta-P (ms) Norm Delta (ms)  Left Median/Ulnar Palm Comparison (Wrist - 8cm)  34C  Median Palm    1.8 <2.2 84.9 Median Palm Ulnar Palm 0.3   Ulnar Palm    1.5 <2.2 22.3      Right Median/Ulnar Palm Comparison (Wrist - 8cm)  34C  Median Palm    1.8 <2.2 91.2 Median Palm Ulnar Palm 0.2   Ulnar  Palm    1.6 <2.2 28.1       EMG   Side Muscle Ins Act Fibs Psw Fasc Number Recrt Dur Dur. Amp Amp. Poly Poly. Comment  Left 1stDorInt Nml Nml Nml Nml Nml Nml Nml Nml Nml Nml Nml Nml N/A  Left PronatorTeres Nml Nml Nml Nml Nml Nml Nml Nml Nml Nml Nml Nml N/A  Left Biceps Nml Nml Nml Nml 1- Rapid Some 1+ Some 1+ Some 1+ N/A  Left Triceps Nml Nml Nml Nml Nml Nml Nml Nml Nml Nml Nml Nml N/A  Left Deltoid Nml Nml Nml Nml 1- Rapid Some 1+ Some 1+ Some 1+ N/A  Right 1stDorInt Nml Nml Nml Nml Nml Nml Nml Nml Nml Nml Nml Nml N/A  Right PronatorTeres Nml Nml Nml Nml Nml Nml Nml Nml Nml Nml Nml Nml N/A  Right Biceps Nml Nml Nml Nml Nml Nml Nml Nml Nml Nml Nml Nml N/A  Right Triceps Nml Nml Nml Nml Nml Nml Nml Nml Nml Nml Nml Nml N/A  Right Deltoid Nml Nml Nml Nml Nml Nml Nml Nml Nml Nml Nml Nml N/A      Waveforms:

## 2020-05-02 ENCOUNTER — Encounter: Payer: Medicare HMO | Admitting: Neurology

## 2020-05-06 ENCOUNTER — Other Ambulatory Visit: Payer: Self-pay | Admitting: Internal Medicine

## 2020-05-07 ENCOUNTER — Other Ambulatory Visit: Payer: Self-pay | Admitting: Allergy

## 2020-05-09 ENCOUNTER — Encounter: Payer: Self-pay | Admitting: Internal Medicine

## 2020-05-09 ENCOUNTER — Telehealth: Payer: Self-pay | Admitting: Internal Medicine

## 2020-05-09 NOTE — Telephone Encounter (Signed)
Pt stopped by for list of dates seen in 2021.  Provided letter with dates listed for pt to take to accountant.

## 2020-05-15 ENCOUNTER — Other Ambulatory Visit: Payer: Self-pay | Admitting: Family

## 2020-05-16 DIAGNOSIS — S32010A Wedge compression fracture of first lumbar vertebra, initial encounter for closed fracture: Secondary | ICD-10-CM | POA: Diagnosis not present

## 2020-06-27 DIAGNOSIS — S32010A Wedge compression fracture of first lumbar vertebra, initial encounter for closed fracture: Secondary | ICD-10-CM | POA: Diagnosis not present

## 2020-08-11 ENCOUNTER — Other Ambulatory Visit: Payer: Self-pay | Admitting: Internal Medicine

## 2020-08-15 ENCOUNTER — Other Ambulatory Visit: Payer: Self-pay | Admitting: Physician Assistant

## 2020-08-15 DIAGNOSIS — K219 Gastro-esophageal reflux disease without esophagitis: Secondary | ICD-10-CM

## 2020-08-15 DIAGNOSIS — K449 Diaphragmatic hernia without obstruction or gangrene: Secondary | ICD-10-CM

## 2020-08-29 DIAGNOSIS — S32010A Wedge compression fracture of first lumbar vertebra, initial encounter for closed fracture: Secondary | ICD-10-CM | POA: Diagnosis not present

## 2020-09-14 ENCOUNTER — Other Ambulatory Visit: Payer: Self-pay | Admitting: Neurosurgery

## 2020-09-14 DIAGNOSIS — S32010A Wedge compression fracture of first lumbar vertebra, initial encounter for closed fracture: Secondary | ICD-10-CM

## 2020-09-26 ENCOUNTER — Telehealth: Payer: Medicare HMO | Admitting: Physician Assistant

## 2020-09-27 ENCOUNTER — Telehealth (INDEPENDENT_AMBULATORY_CARE_PROVIDER_SITE_OTHER): Payer: Medicare HMO | Admitting: Physician Assistant

## 2020-09-27 ENCOUNTER — Other Ambulatory Visit: Payer: Self-pay

## 2020-09-27 ENCOUNTER — Encounter: Payer: Self-pay | Admitting: Physician Assistant

## 2020-09-27 DIAGNOSIS — J069 Acute upper respiratory infection, unspecified: Secondary | ICD-10-CM | POA: Diagnosis not present

## 2020-09-27 DIAGNOSIS — R059 Cough, unspecified: Secondary | ICD-10-CM

## 2020-09-27 MED ORDER — AZITHROMYCIN 250 MG PO TABS: ORAL_TABLET | ORAL | 0 refills | Status: DC

## 2020-09-27 MED ORDER — BENZONATATE 100 MG PO CAPS: 100.0000 mg | ORAL_CAPSULE | Freq: Two times a day (BID) | ORAL | 0 refills | Status: DC | PRN

## 2020-09-27 NOTE — Progress Notes (Signed)
Patient reports 49mh of cough or sore throat. Patient has eaten today and patient has taken medication today Patient reports off white color with cough.

## 2020-09-27 NOTE — Progress Notes (Signed)
Established Patient Office Visit  Subjective:  Patient ID: [name], female    DOB: 12-06-1965  Age: 55 y.o. MRN: [id number]  CC:  Chief Complaint  Patient presents with   Cough   Virtual Visit via Telephone Note  I connected with [name] on 09/27/20 at  2:50 PM EDT by telephone and verified that I am speaking with the correct person using two identifiers.  Location: Patient: Home Provider: Primary Care at [facility]   I discussed the limitations, risks, security and privacy concerns of performing an evaluation and management service by telephone and the availability of in person appointments. I also discussed with the patient that there may be a patient responsible charge related to this service. The patient expressed understanding and agreed to proceed.   History of Present Illness:   [name] reports that she has been having a productive cough with white mucus for the past month.  Reports that she does have a sore throat intermittently, believes this to be due to the constant cough.  Reports that she has been using Mucinex with a little relief.  Reports that she is eating and drinking okay.  No sick contacts.  2 previous [place]. Observations/Objective: Medical history and current medications reviewed, no physical exam completed  Past Medical History:  Diagnosis Date   Anxiety    Arthritis    neck, knees, shoulders   Asthma    Bipolar disorder ([place])    currently feeling MANIC- 10/02/2016   Depression    GERD (gastroesophageal reflux disease)    Hip fracture ([place]) 06/2019   History of blood transfusion    as a newborn    History of lump of left breast    Hyperlipidemia    Lumbar pseudoarthrosis    Motion sickness    cars   OSA (obstructive sleep apnea) 01/09/2016   can't afford CPAP   Personality disorder (HCC)    PONV (postoperative nausea and vomiting)    Post traumatic stress disorder (PTSD)    Substance abuse ([place])    Synovial cyst      Past Surgical History:  Procedure Laterality Date   COLONOSCOPY WITH PROPOFOL N/A 11/04/2017   Procedure: COLONOSCOPY WITH PROPOFOL;  Surgeon: Lin Landsman, MD;  Location: [place];  Service: Endoscopy;  Laterality: N/A;   ESOPHAGOGASTRODUODENOSCOPY (EGD) WITH PROPOFOL N/A 11/04/2017   Procedure: ESOPHAGOGASTRODUODENOSCOPY (EGD) WITH PROPOFOL with biopsies;  Surgeon: Lin Landsman, MD;  Location: [place];  Service: Endoscopy;  Laterality: N/A;  sleep apnea   KNEE SURGERY Left    x5, post basketball injury   LUMBAR LAMINECTOMY/DECOMPRESSION MICRODISCECTOMY Left 01/22/2016   Procedure: Laminectomy for facet/synovial cyst - left - Lumbar four - lumbar five;  Surgeon: Earnie Larsson, MD;  Location: [place];  Service: Neurosurgery;  Laterality: Left;  Laminectomy for facet/synovial cyst - left - Lumbar four - lumbar five   POLYPECTOMY N/A 11/04/2017   Procedure: POLYPECTOMY INTESTINAL;  Surgeon: Lin Landsman, MD;  Location: [place];  Service: Endoscopy;  Laterality: N/A;   SHOULDER SURGERY Left    x2   SPINE SURGERY N/A    Phreesia 08/07/2019   TOE SURGERY Bilateral    bone spurs    Family History  Problem Relation Age of Onset   Heart disease Father    Hyperlipidemia Father    Alcohol abuse Brother    Alcohol abuse Paternal Uncle    Breast cancer Maternal Aunt  70's   Colon cancer Neg Hx     Social History   Socioeconomic History   Marital status: Single    Spouse name: Not on file   Number of children: 0   Years of education: Not on file   Highest education level: Bachelor's degree (e.g., BA, AB, BS)  Occupational History   Not on file  Tobacco Use   Smoking status: Former    Packs/day: 1.00    Years: 1.00    Pack years: 1.00    Types: Cigarettes    Quit date: 04/15/1996    Years since quitting: 24.4   Smokeless tobacco: Never   Tobacco comments:    started back again in 2016, smoked for about 6 mo and then quit   Vaping Use   Vaping Use: Never used  Substance and Sexual Activity   Alcohol use: No    Alcohol/week: 0.0 standard drinks    Comment: quit 20 years   Drug use: Yes    Frequency: 14.0 times per week    Types: Marijuana    Comment: 20 yrs. ago- cocaine    Sexual activity: Not Currently  Other Topics Concern   Not on file  Social History Narrative   Not on file   Social Determinants of Health   Financial Resource Strain: Not on file  Food Insecurity: Not on file  Transportation Needs: Not on file  Physical Activity: Not on file  Stress: Not on file  Social Connections: Not on file  Intimate Partner Violence: Not on file    Outpatient Medications Prior to Visit  Medication Sig Dispense Refill   albuterol (VENTOLIN HFA) 108 (90 Base) MCG/ACT inhaler TAKE 2 PUFFS BY MOUTH EVERY 4 TO 6 HOURS AS NEEDED 18 each 1   aspirin 81 MG tablet Take 81 mg by mouth daily.     azelastine (ASTELIN) 0.1 % nasal spray PLACE 2 SPRAYS INTO BOTH NOSTRILS 2 (TWO) TIMES DAILY. 30 mL 5   buPROPion (WELLBUTRIN XL) 300 MG 24 hr tablet Take 1 tablet (300 mg total) by mouth daily. 90 tablet 1   calcium-vitamin D (OSCAL WITH D) 500-200 MG-UNIT tablet Take 1 tablet by mouth 3 (three) times daily. 90 tablet 6   cyclobenzaprine (FLEXERIL) 10 MG tablet Take 10 mg by mouth 3 (three) times daily as needed for muscle spasms.     fluticasone (FLONASE) 50 MCG/ACT nasal spray Place 2 sprays into both nostrils daily. 16 g 6   Fluticasone-Salmeterol (WIXELA INHUB) 250-50 MCG/DOSE AEPB Inhale 1 puff twice a day to help prevent cough and wheeze. 60 each 3   Fluticasone-Salmeterol (WIXELA INHUB) 250-50 MCG/DOSE AEPB Inhale 1 puff into the lungs in the morning and at bedtime. 60 each 2   gabapentin (NEURONTIN) 300 MG capsule Take 1 capsule by mouth 3 (three) times daily.     meloxicam (MOBIC) 7.5 MG tablet Take 1 tablet (7.5 mg total) by mouth 2 (two) times daily as needed for pain. 30 tablet 2   montelukast (SINGULAIR) 10  MG tablet TAKE 1 TABLET BY MOUTH EVERYDAY AT BEDTIME 30 tablet 5   Olopatadine HCl 0.2 % SOLN PLACE 1 DROP INTO BOTH EYES DAILY AS NEEDED. 7.5 mL 1   ondansetron (ZOFRAN ODT) 8 MG disintegrating tablet Take 1 tablet (8 mg total) by mouth every 8 (eight) hours as needed for nausea or vomiting. 20 tablet 0   pantoprazole (PROTONIX) 40 MG tablet TAKE 1 TABLET BY MOUTH EVERY DAY 30 tablet 3  simvastatin (ZOCOR) 20 MG tablet TAKE 1 TABLET BY MOUTH EVERY DAY AT 6PM 90 tablet 0   trazodone (DESYREL) 300 MG tablet Take 1 tablet (300 mg total) by mouth at bedtime as needed for sleep. 30 tablet 2   triamcinolone (NASACORT) 55 MCG/ACT AERO nasal inhaler      zonisamide (ZONEGRAN) 100 MG capsule Take 1 capsule (100 mg total) by mouth at bedtime. 90 capsule 1   No facility-administered medications prior to visit.    Allergies  Allergen Reactions   Other    Effexor [Venlafaxine] Other (See Comments)    UNSPECIFIED REACTION, headaches, felt funny, withdrawal with missed dose    Lamictal [Lamotrigine] Rash    ROS Review of Systems  Constitutional:  Negative for chills and fever.  HENT:  Positive for congestion and sore throat. Negative for ear pain, sinus pressure and trouble swallowing.   Eyes: Negative.   Respiratory:  Positive for cough. Negative for shortness of breath and wheezing.   Cardiovascular:  Negative for chest pain.  Gastrointestinal:  Negative for nausea and vomiting.  Endocrine: Negative.   Genitourinary: Negative.   Musculoskeletal:  Negative for myalgias.  Skin: Negative.   Allergic/Immunologic: Negative.   Neurological: Negative.   Hematological: Negative.   Psychiatric/Behavioral: Negative.       Objective:      There were no vitals taken for this visit. Wt Readings from Last 3 Encounters:  04/17/20 135 lb (61.2 kg)  04/05/20 130 lb (59 kg)  09/14/19 139 lb (63 kg)     Health Maintenance Due  Topic Date Due   Zoster Vaccines- Shingrix (1 of 2) Never done    COVID-19 Vaccine (3 - Booster) 02/15/2020   Pneumococcal Vaccine 12-55 Years old (2 - PCV) 05/29/2020    There are no preventive care reminders to display for this patient.  Lab Results  Component Value Date   TSH 1.310 04/05/2020   Lab Results  Component Value Date   WBC 9.2 04/05/2020   HGB 14.9 04/05/2020   HCT 44.9 04/05/2020   MCV 93 04/05/2020   PLT 444 04/05/2020   Lab Results  Component Value Date   NA 137 04/05/2020   K 4.5 04/05/2020   CO2 23 05/12/2019   GLUCOSE 91 04/05/2020   BUN 13 04/05/2020   CREATININE 0.92 04/05/2020   BILITOT 0.2 04/05/2020   ALKPHOS 81 04/05/2020   AST 15 04/05/2020   ALT 23 05/12/2019   PROT 7.0 04/05/2020   ALBUMIN 4.4 04/05/2020   CALCIUM 9.8 04/05/2020   ANIONGAP 9 10/28/2016   GFR 77.07 07/07/2018   Lab Results  Component Value Date   CHOL 168 08/10/2019   Lab Results  Component Value Date   HDL 71 08/10/2019   Lab Results  Component Value Date   LDLCALC 79 08/10/2019   Lab Results  Component Value Date   TRIG 103 08/10/2019   Lab Results  Component Value Date   CHOLHDL 2.4 08/10/2019   Lab Results  Component Value Date   HGBA1C 5.6 06/22/2018      Assessment & Plan:   Problem List Items Addressed This Visit   None Visit Diagnoses     Acute upper respiratory infection    -  Primary   Relevant Medications   azithromycin (ZITHROMAX) 250 MG tablet   Cough       Relevant Medications   benzonatate (TESSALON) 100 MG capsule       Meds ordered this encounter  Medications  azithromycin (ZITHROMAX) 250 MG tablet    Sig: Take 2 tabs PO day 1, then take 1 tab PO once daily    Dispense:  6 tablet    Refill:  0    Order Specific Question:   Supervising Provider    Answer:   Elsie Stain [[id number]]   benzonatate (TESSALON) 100 MG capsule    Sig: Take 1 capsule (100 mg total) by mouth 2 (two) times daily as needed for cough.    Dispense:  20 capsule    Refill:  0    Order Specific Question:    Supervising Provider    Answer:   Elsie Stain [[id number]]    Assessment and Plan:    1. Acute upper respiratory infection Trial azithromycin.  Patient education given on supportive care.  Red flags given for prompt reevaluation - azithromycin (ZITHROMAX) 250 MG tablet; Take 2 tabs PO day 1, then take 1 tab PO once daily  Dispense: 6 tablet; Refill: 0  2. Cough Trial Tessalon Perles - benzonatate (TESSALON) 100 MG capsule; Take 1 capsule (100 mg total) by mouth 2 (two) times daily as neede Follow Up Instructions:    I discussed the assessment and treatment plan with the patient. The patient was provided an opportunity to ask questions and all were answered. The patient agreed with the plan and demonstrated an understanding of the instructions.   The patient was advised to call back or seek an in-person evaluation if the symptoms worsen or if the condition fails to improve as anticipated.  I provided 21 minutes of non-face-to-face time during this encounter.    Follow-up: Return if symptoms worsen or fail to improve.    Loraine Grip Mayers, PA-C

## 2020-09-28 NOTE — Patient Instructions (Signed)
Upper Respiratory Infection, Adult An upper respiratory infection (URI) is a common viral infection of the nose, throat, and upper air passages that lead to the lungs. The most common type of URI is the common cold. URIs usually get better on their own, without medical treatment. What are the causes? A URI is caused by a virus. You may catch a virus by: Breathing in droplets from an infected person's cough or sneeze. Touching something that has been exposed to the virus (contaminated) and then touching your mouth, nose, or eyes. What increases the risk? You are more likely to get a URI if: You are very young or very old. It is autumn or winter. You have close contact with others, such as at a daycare, school, or health care facility. You smoke. You have long-term (chronic) heart or lung disease. You have a weakened disease-fighting (immune) system. You have nasal allergies or asthma. You are experiencing a lot of stress. You work in an area that has poor air circulation. You have poor nutrition. What are the signs or symptoms? A URI usually involves some of the following symptoms: Runny or stuffy (congested) nose. Sneezing. Cough. Sore throat. Headache. Fatigue. Fever. Loss of appetite. Pain in your forehead, behind your eyes, and over your cheekbones (sinus pain). Muscle aches. Redness or irritation of the eyes. Pressure in the ears or face. How is this diagnosed? This condition may be diagnosed based on your medical history and symptoms, and a physical exam. Your health care provider may use a cotton swab to take a mucus sample from your nose (nasal swab). This sample can be tested to determine what virus is causing the illness. How is this treated? URIs usually get better on their own within 7-10 days. You can take steps at home to relieve your symptoms. Medicines cannot cure URIs, but your health care provider may recommend certain medicines to help relieve symptoms, such  as: Over-the-counter cold medicines. Cough suppressants. Coughing is a type of defense against infection that helps to clear the respiratory system, so take these medicines only as recommended by your health care provider. Fever-reducing medicines. Follow these instructions at home: Activity Rest as needed. If you have a fever, stay home from work or school until your fever is gone or until your health care provider says you are no longer contagious. Your health care provider may have you wear a face mask to prevent your infection from spreading. Relieving symptoms Gargle with a salt-water mixture 3-4 times a day or as needed. To make a salt-water mixture, completely dissolve -1 tsp of salt in 1 cup of warm water. Use a cool-mist humidifier to add moisture to the air. This can help you breathe more easily. Eating and drinking  Drink enough fluid to keep your urine pale yellow. Eat soups and other clear broths. General instructions  Take over-the-counter and prescription medicines only as told by your health care provider. These include cold medicines, fever reducers, and cough suppressants. Do not use any products that contain nicotine or tobacco, such as cigarettes and e-cigarettes. If you need help quitting, ask your health care provider. Stay away from secondhand smoke. Stay up to date on all immunizations, including the yearly (annual) flu vaccine. Keep all follow-up visits as told by your health care provider. This is important. How to prevent the spread of infection to others  URIs can be passed from person to person (are contagious). To prevent the infection from spreading: Wash your hands often with soap and   water. If soap and water are not available, use hand sanitizer. Avoid touching your mouth, face, eyes, or nose. Cough or sneeze into a tissue or your sleeve or elbow instead of into your hand or into the air. Contact a health care provider if: You are getting worse instead  of better. You have a fever or chills. Your mucus is brown or red. You have yellow or brown discharge coming from your nose. You have pain in your face, especially when you bend forward. You have swollen neck glands. You have pain while swallowing. You have white areas in the back of your throat. Get help right away if: You have shortness of breath that gets worse. You have severe or persistent: Headache. Ear pain. Sinus pain. Chest pain. You have chronic lung disease along with any of the following: Wheezing. Prolonged cough. Coughing up blood. A change in your usual mucus. You have a stiff neck. You have changes in your: Vision. Hearing. Thinking. Mood. Summary An upper respiratory infection (URI) is a common infection of the nose, throat, and upper air passages that lead to the lungs. A URI is caused by a virus. URIs usually get better on their own within 7-10 days. Medicines cannot cure URIs, but your health care provider may recommend certain medicines to help relieve symptoms. This information is not intended to replace advice given to you by your health care provider. Make sure you discuss any questions you have with your health care provider. Document Revised: 10/27/2019 Document Reviewed: 10/27/2019 Elsevier Patient Education  2022 Elsevier Inc.  

## 2020-10-06 ENCOUNTER — Other Ambulatory Visit: Payer: Self-pay | Admitting: Family

## 2020-10-06 ENCOUNTER — Ambulatory Visit
Admission: RE | Admit: 2020-10-06 | Discharge: 2020-10-06 | Disposition: A | Discharge status: Home / Self Care | Payer: Medicare HMO | Source: Ambulatory Visit | Attending: Neurosurgery | Admitting: Neurosurgery

## 2020-10-06 ENCOUNTER — Other Ambulatory Visit: Payer: Self-pay

## 2020-10-06 DIAGNOSIS — M4056 Lordosis, unspecified, lumbar region: Secondary | ICD-10-CM | POA: Diagnosis not present

## 2020-10-06 DIAGNOSIS — M5126 Other intervertebral disc displacement, lumbar region: Secondary | ICD-10-CM | POA: Diagnosis not present

## 2020-10-06 DIAGNOSIS — M4319 Spondylolisthesis, multiple sites in spine: Secondary | ICD-10-CM | POA: Diagnosis not present

## 2020-10-06 DIAGNOSIS — S32010A Wedge compression fracture of first lumbar vertebra, initial encounter for closed fracture: Secondary | ICD-10-CM | POA: Diagnosis not present

## 2020-10-07 NOTE — Telephone Encounter (Signed)
Ok to refill. Needs to schedule a follow up appointment

## 2020-10-08 DIAGNOSIS — L820 Inflamed seborrheic keratosis: Secondary | ICD-10-CM | POA: Diagnosis not present

## 2020-10-08 DIAGNOSIS — L308 Other specified dermatitis: Secondary | ICD-10-CM | POA: Diagnosis not present

## 2020-10-09 ENCOUNTER — Other Ambulatory Visit: Payer: Medicare HMO

## 2020-10-10 DIAGNOSIS — S32010A Wedge compression fracture of first lumbar vertebra, initial encounter for closed fracture: Secondary | ICD-10-CM | POA: Diagnosis not present

## 2020-11-07 ENCOUNTER — Other Ambulatory Visit: Payer: Self-pay | Admitting: Family

## 2020-11-19 ENCOUNTER — Other Ambulatory Visit: Payer: Self-pay | Admitting: Family

## 2020-11-19 NOTE — Telephone Encounter (Signed)
Ok to refill Wixela 250-50  1 puff twice a day. Quantity #1 with 2 refills. She needs to schedule a follow up appointment. Thank you.

## 2020-11-20 ENCOUNTER — Telehealth (HOSPITAL_COMMUNITY): Payer: Self-pay | Admitting: *Deleted

## 2020-11-20 ENCOUNTER — Other Ambulatory Visit (HOSPITAL_COMMUNITY): Payer: Self-pay | Admitting: *Deleted

## 2020-11-20 MED ORDER — TRAZODONE HCL 300 MG PO TABS: 300.0000 mg | ORAL_TABLET | Freq: Every evening | ORAL | 0 refills | Status: DC | PRN

## 2020-11-20 MED ORDER — ZONISAMIDE 100 MG PO CAPS: 100.0000 mg | ORAL_CAPSULE | Freq: Every day | ORAL | 0 refills | Status: DC

## 2020-11-20 MED ORDER — BUPROPION HCL ER (XL) 300 MG PO TB24: 300.0000 mg | ORAL_TABLET | Freq: Every day | ORAL | 0 refills | Status: DC

## 2020-11-20 NOTE — Progress Notes (Signed)
Thirty days bridge supply by Covering MD. Patient must established care with new provider. 

## 2020-11-20 NOTE — Telephone Encounter (Signed)
Former pt of Dr. Montel Culver requesting refills of Wellbutrin 300 mg xl qd, Trazodone 300 mg qhs, and Zonegran 100 mg qhs.

## 2020-11-20 NOTE — Telephone Encounter (Signed)
error 

## 2020-11-20 NOTE — Telephone Encounter (Signed)
Please review and advise.

## 2020-12-10 ENCOUNTER — Other Ambulatory Visit: Payer: Self-pay

## 2020-12-11 NOTE — Telephone Encounter (Signed)
Am I looking at this wrong? Do not see where patient was ever seen in our office. Am I missing something?

## 2020-12-14 ENCOUNTER — Other Ambulatory Visit (HOSPITAL_COMMUNITY): Payer: Self-pay | Admitting: Psychiatry

## 2020-12-17 ENCOUNTER — Other Ambulatory Visit: Payer: Self-pay | Admitting: Family

## 2020-12-17 NOTE — Telephone Encounter (Signed)
Ok to refill. She needs to schedule a follow up appointment

## 2020-12-18 ENCOUNTER — Other Ambulatory Visit: Payer: Self-pay | Admitting: Physician Assistant

## 2020-12-18 ENCOUNTER — Other Ambulatory Visit (HOSPITAL_COMMUNITY): Payer: Self-pay | Admitting: Psychiatry

## 2020-12-18 DIAGNOSIS — K449 Diaphragmatic hernia without obstruction or gangrene: Secondary | ICD-10-CM

## 2020-12-18 DIAGNOSIS — K219 Gastro-esophageal reflux disease without esophagitis: Secondary | ICD-10-CM

## 2020-12-19 ENCOUNTER — Other Ambulatory Visit: Payer: Self-pay

## 2020-12-19 ENCOUNTER — Telehealth (HOSPITAL_COMMUNITY): Payer: Self-pay | Admitting: *Deleted

## 2020-12-19 ENCOUNTER — Ambulatory Visit (INDEPENDENT_AMBULATORY_CARE_PROVIDER_SITE_OTHER): Payer: Medicare HMO | Admitting: Family Medicine

## 2020-12-19 ENCOUNTER — Encounter: Payer: Self-pay | Admitting: Family Medicine

## 2020-12-19 ENCOUNTER — Other Ambulatory Visit (HOSPITAL_COMMUNITY): Payer: Self-pay | Admitting: Psychiatry

## 2020-12-19 VITALS — BP 146/88 | HR 89 | Temp 98.2°F | Resp 16 | Wt 145.0 lb

## 2020-12-19 DIAGNOSIS — E785 Hyperlipidemia, unspecified: Secondary | ICD-10-CM | POA: Diagnosis not present

## 2020-12-19 DIAGNOSIS — J4541 Moderate persistent asthma with (acute) exacerbation: Secondary | ICD-10-CM | POA: Diagnosis not present

## 2020-12-19 MED ORDER — ALBUTEROL SULFATE HFA 108 (90 BASE) MCG/ACT IN AERS: INHALATION_SPRAY | RESPIRATORY_TRACT | 1 refills | Status: DC

## 2020-12-19 MED ORDER — SIMVASTATIN 20 MG PO TABS: ORAL_TABLET | ORAL | 1 refills | Status: DC

## 2020-12-19 MED ORDER — ZONISAMIDE 100 MG PO CAPS: 100.0000 mg | ORAL_CAPSULE | Freq: Every day | ORAL | 0 refills | Status: DC

## 2020-12-19 MED ORDER — BUPROPION HCL ER (XL) 300 MG PO TB24: 300.0000 mg | ORAL_TABLET | Freq: Every day | ORAL | 0 refills | Status: DC

## 2020-12-19 MED ORDER — PREDNISONE 50 MG PO TABS
50.0000 mg | ORAL_TABLET | Freq: Every day | ORAL | 0 refills | Status: DC
Start: 2020-12-19 — End: 2021-02-28

## 2020-12-19 MED ORDER — TRAZODONE HCL 300 MG PO TABS: 300.0000 mg | ORAL_TABLET | Freq: Every evening | ORAL | 0 refills | Status: DC | PRN

## 2020-12-19 NOTE — Progress Notes (Signed)
Established  Patient Office Visit  Subjective:  Patient ID: [name], female    DOB: May 15, 1965  Age: 55 y.o. MRN: [id number]  CC:  Chief Complaint  Patient presents with   Medication Refill   Nasal Congestion    HPI [name] presents for follow up of hyperlipidemia. She also reports some increased asthma sx for the past 2-3 weeks. She denies fever/chills.  Past Medical History:  Diagnosis Date   Anxiety    Arthritis    neck, knees, shoulders   Asthma    Bipolar disorder ([place])    currently feeling MANIC- 10/02/2016   Depression    GERD (gastroesophageal reflux disease)    Hip fracture ([place]) 06/2019   History of blood transfusion    as a newborn    History of lump of left breast    Hyperlipidemia    Lumbar pseudoarthrosis    Motion sickness    cars   OSA (obstructive sleep apnea) 01/09/2016   can't afford CPAP   Personality disorder (HCC)    PONV (postoperative nausea and vomiting)    Post traumatic stress disorder (PTSD)    Substance abuse ([place])    Synovial cyst     Past Surgical History:  Procedure Laterality Date   COLONOSCOPY WITH PROPOFOL N/A 11/04/2017   Procedure: COLONOSCOPY WITH PROPOFOL;  Surgeon: Lin Landsman, MD;  Location: [place];  Service: Endoscopy;  Laterality: N/A;   ESOPHAGOGASTRODUODENOSCOPY (EGD) WITH PROPOFOL N/A 11/04/2017   Procedure: ESOPHAGOGASTRODUODENOSCOPY (EGD) WITH PROPOFOL with biopsies;  Surgeon: Lin Landsman, MD;  Location: [place];  Service: Endoscopy;  Laterality: N/A;  sleep apnea   KNEE SURGERY Left    x5, post basketball injury   LUMBAR LAMINECTOMY/DECOMPRESSION MICRODISCECTOMY Left 01/22/2016   Procedure: Laminectomy for facet/synovial cyst - left - Lumbar four - lumbar five;  Surgeon: Earnie Larsson, MD;  Location: [place];  Service: Neurosurgery;  Laterality: Left;  Laminectomy for facet/synovial cyst - left - Lumbar four - lumbar five   POLYPECTOMY N/A 11/04/2017   Procedure:  POLYPECTOMY INTESTINAL;  Surgeon: Lin Landsman, MD;  Location: [place];  Service: Endoscopy;  Laterality: N/A;   SHOULDER SURGERY Left    x2   SPINE SURGERY N/A    Phreesia 08/07/2019   TOE SURGERY Bilateral    bone spurs    Family History  Problem Relation Age of Onset   Heart disease Father    Hyperlipidemia Father    Alcohol abuse Brother    Alcohol abuse Paternal Uncle    Breast cancer Maternal Aunt        70's   Colon cancer Neg Hx     Social History   Socioeconomic History   Marital status: Single    Spouse name: Not on file   Number of children: 0   Years of education: Not on file   Highest education level: Bachelor's degree (e.g., BA, AB, BS)  Occupational History   Not on file  Tobacco Use   Smoking status: Former    Packs/day: 1.00    Years: 1.00    Pack years: 1.00    Types: Cigarettes    Quit date: 04/15/1996    Years since quitting: 24.6   Smokeless tobacco: Never   Tobacco comments:    started back again in 2016, smoked for about 6 mo and then quit  Vaping Use   Vaping Use: Never used  Substance and Sexual Activity   Alcohol use:  No    Alcohol/week: 0.0 standard drinks    Comment: quit 20 years   Drug use: Yes    Frequency: 14.0 times per week    Types: Marijuana    Comment: 20 yrs. ago- cocaine    Sexual activity: Not Currently  Other Topics Concern   Not on file  Social History Narrative   Not on file   Social Determinants of Health   Financial Resource Strain: Not on file  Food Insecurity: Not on file  Transportation Needs: Not on file  Physical Activity: Not on file  Stress: Not on file  Social Connections: Not on file  Intimate Partner Violence: Not on file    ROS Review of Systems  Constitutional:  Negative for fever.  Respiratory:  Positive for cough and wheezing.   All other systems reviewed and are negative.  Objective:   Today's Vitals: BP (!) 146/88   Pulse 89   Temp 98.2 F (36.8 C) (Oral)    Resp 16   Wt 145 lb (65.8 kg)   SpO2 98%   BMI 27.40 kg/m   Physical Exam Vitals and nursing note reviewed.  Constitutional:      General: She is not in acute distress. Cardiovascular:     Rate and Rhythm: Normal rate and regular rhythm.  Pulmonary:     Effort: Pulmonary effort is normal. No respiratory distress.     Breath sounds: Decreased air movement present.  Abdominal:     Palpations: Abdomen is soft.     Tenderness: There is no abdominal tenderness.  Neurological:     General: No focal deficit present.     Mental Status: She is alert and oriented to person, place, and time.    Assessment & Plan:   1. Moderate persistent asthma with exacerbation Prednisone prescribed. Albuterol refilled  2. Hyperlipidemia, unspecified hyperlipidemia type Meds refilled. Continue present management  Outpatient Encounter Medications as of 12/19/2020  Medication Sig   albuterol (VENTOLIN HFA) 108 (90 Base) MCG/ACT inhaler TAKE 2 PUFFS BY MOUTH EVERY 4 TO 6 HOURS AS NEEDED   aspirin 81 MG tablet Take 81 mg by mouth daily.   azelastine (ASTELIN) 0.1 % nasal spray PLACE 2 SPRAYS INTO BOTH NOSTRILS 2 (TWO) TIMES DAILY.   azithromycin (ZITHROMAX) 250 MG tablet Take 2 tabs PO day 1, then take 1 tab PO once daily   benzonatate (TESSALON) 100 MG capsule Take 1 capsule (100 mg total) by mouth 2 (two) times daily as needed for cough.   buPROPion (WELLBUTRIN XL) 300 MG 24 hr tablet Take 1 tablet (300 mg total) by mouth daily.   calcium-vitamin D (OSCAL WITH D) 500-200 MG-UNIT tablet Take 1 tablet by mouth 3 (three) times daily.   cyclobenzaprine (FLEXERIL) 10 MG tablet Take 10 mg by mouth 3 (three) times daily as needed for muscle spasms.   fluticasone (FLONASE) 50 MCG/ACT nasal spray Place 2 sprays into both nostrils daily.   Fluticasone-Salmeterol (WIXELA INHUB) 250-50 MCG/DOSE AEPB Inhale 1 puff into the lungs in the morning and at bedtime.   gabapentin (NEURONTIN) 300 MG capsule Take 1 capsule  by mouth 3 (three) times daily.   meloxicam (MOBIC) 7.5 MG tablet Take 1 tablet (7.5 mg total) by mouth 2 (two) times daily as needed for pain.   montelukast (SINGULAIR) 10 MG tablet TAKE 1 TABLET BY MOUTH EVERYDAY AT BEDTIME   Olopatadine HCl 0.2 % SOLN PLACE 1 DROP INTO BOTH EYES DAILY AS NEEDED.   ondansetron (ZOFRAN ODT) 8  MG disintegrating tablet Take 1 tablet (8 mg total) by mouth every 8 (eight) hours as needed for nausea or vomiting.   pantoprazole (PROTONIX) 40 MG tablet TAKE 1 TABLET BY MOUTH EVERY DAY   simvastatin (ZOCOR) 20 MG tablet TAKE 1 TABLET BY MOUTH EVERY DAY AT 6PM   trazodone (DESYREL) 300 MG tablet Take 1 tablet (300 mg total) by mouth at bedtime as needed for sleep.   triamcinolone (NASACORT) 55 MCG/ACT AERO nasal inhaler    WIXELA INHUB 250-50 MCG/ACT AEPB INHALE 1 PUFF TWICE A DAY TO HELP PREVENT COUGH AND WHEEZE.   zonisamide (ZONEGRAN) 100 MG capsule Take 1 capsule (100 mg total) by mouth at bedtime.   No facility-administered encounter medications on file as of 12/19/2020.    Follow-up: Return in about 6 months (around 06/19/2021) for follow up, chronic med issues.   Becky Sax, MD

## 2020-12-19 NOTE — Telephone Encounter (Signed)
Former pt of Dr. Audria Nine who is scheduled to see Dr. Parke Poisson on 01/01/21 called requesting refills on all psych meds. Medications last refilled by you on 11/20/20. Please review and advise. Thanks.

## 2020-12-19 NOTE — Telephone Encounter (Signed)
Thirty days bridge supply by Covering MD. Patient must keep appointment with new provider.

## 2020-12-19 NOTE — Progress Notes (Signed)
Patient is here for medication refill. Patient is a little congested

## 2020-12-20 ENCOUNTER — Encounter: Payer: Self-pay | Admitting: Family Medicine

## 2021-01-01 ENCOUNTER — Other Ambulatory Visit: Payer: Self-pay

## 2021-01-01 ENCOUNTER — Encounter (HOSPITAL_COMMUNITY): Payer: Self-pay | Admitting: Psychiatry

## 2021-01-01 ENCOUNTER — Ambulatory Visit (HOSPITAL_BASED_OUTPATIENT_CLINIC_OR_DEPARTMENT_OTHER): Payer: Medicare HMO | Admitting: Psychiatry

## 2021-01-01 VITALS — BP 165/101 | HR 76 | Temp 98.5°F | Resp 18 | Ht 61.0 in | Wt 149.0 lb

## 2021-01-01 DIAGNOSIS — F3175 Bipolar disorder, in partial remission, most recent episode depressed: Secondary | ICD-10-CM | POA: Diagnosis not present

## 2021-01-01 MED ORDER — ZONISAMIDE 100 MG PO CAPS: 100.0000 mg | ORAL_CAPSULE | Freq: Every day | ORAL | 1 refills | Status: DC

## 2021-01-01 MED ORDER — BUPROPION HCL ER (XL) 300 MG PO TB24: 300.0000 mg | ORAL_TABLET | Freq: Every day | ORAL | 1 refills | Status: DC

## 2021-01-01 NOTE — Progress Notes (Signed)
BH MD/PA/NP OP Progress Note  01/01/2021 11:38 AM [name]  MRN:  [id number]  Chief Complaint:   medication management HPI: Janice . Brennan is a 55 year old female . She was seeing [name] for outpatient psychiatric management . He has retired ( last seen by [name]. On 03/15/2020 ) .  55 year old single female, lives alone, on disability.   She reports long history of Mood Disorder/ Bipolar Disorder . She states she has generally been stable and doing well over the last " couple of years . She states that her mood has been "OK", and denies significant depression or major neuro-vegetative symptoms at this time. She also denies having any recent hypomanic or manic symptoms ( and none are noted during this session). She reports she is functioning at her baseline and "OK" in her every day activities , which include visiting and helping care for her elderly parents on a daily basis .  She does endorse some residual subjective anhedonia . Otherwise does not endorse significant neuro-vegetative symptoms at this time. Denies SI and presents future oriented . Also establishes her love and sense of responsibility towards her elderly parents as a protective factor for self harm, denies having any passive SI or thoughts of death, denies hallucinations and no psychotic symptoms are noted .  She states she has been taking prescribed psychiatric medications regularly, and that she has been on current regimen for more than a year . States this medication regimen has been well tolerated , without side effects, and she feels it has been more effective than prior medication trials . She reports she has obtained refills since she saw [name] last and has not had significant lapses in medication compliance . As reviewed in chart, medications were last prescribed by [name] x 1 month as a bridge supply until this appointment .   She reports history of Bipolar Disorder, and describes history of depressive episodes  as well as episodes of hypomania . States she has had past psychiatric admissions , but not since 2006 , and generally reports she has been more stable over recent years , which she attributes to her current medication regimen and to  having improved coping skills and ego strengths as she has gotten older .  With regards to medications reports she has been on Wellbutrin XL 300 mgr QDAY for " years ", on Zonegran 100 mgr QDAY for more than 1-2 years . Also takes Trazodone up to 300 mgr QHS for insomnia . She denies any history of seizures and Zonegran has apparently been prescribed as a mood stabilizer . As reviewed in [name] notes, she has been on several prior mood stabilizer trials to include Latuda/Oxcarbamazepine ( developed hyponatremia) Seroquel, Depakote ( gained excessive weight) , Lamotrigine ( reported as allergy-developed rash) . Patient corroborates that [name] was poorly tolerated, and that she has been on several prior medication trials which caused intolerable weight gain.  Past history of alcohol use disorder, reports has been completely abstinent from alcohol for several years . Uses cannabis regularly. Remote history of cocaine use many years ago. Reports she is prescribed opiate analgesic by her orthopedist due to chronic pain. Denies abusing or misusing opiates.  She states she feels her current regimen has worked well / better than other medication trials, and been well tolerated . She is hoping to continue current regimen.   Of note, reports history of chronic lower back pain for which she sees a specialist .  Reports she recently completed a steroid course.   We reviewed side effects, to include potential risk of seizures or antidepressant related risk of " switch " to manic symptoms (on Wellbutrin),  potential for sedation on Trazodone,and potential risk for severe rash , metabolic acidosis, cognitive side effects on Zonegran. We also reviewed current off label use of  Zonegran .      Visit Diagnosis: Bipolar Disorder II by history   Past Psychiatric History: Reports history of Bipolar Disorder diagnosis. Chart notes also indicate history of Borderline Personality Disorder .  Past history of Alcohol Use Disorder , reports has been sober /abstinent for several years .   Past Medical History:  Past Medical History:  Diagnosis Date   Anxiety    Arthritis    neck, knees, shoulders   Asthma    Bipolar disorder ([name])    currently feeling MANIC- 10/02/2016   Depression    GERD (gastroesophageal reflux disease)    Hip fracture ([name]) 06/2019   History of blood transfusion    as a newborn    History of lump of left breast    Hyperlipidemia    Lumbar pseudoarthrosis    Motion sickness    cars   OSA (obstructive sleep apnea) 01/09/2016   can't afford CPAP   Personality disorder ([name])    PONV (postoperative nausea and vomiting)    Post traumatic stress disorder (PTSD)    Substance abuse ([name])    Synovial cyst     Past Surgical History:  Procedure Laterality Date   COLONOSCOPY WITH PROPOFOL N/A 11/04/2017   Procedure: COLONOSCOPY WITH PROPOFOL;  Surgeon: Janice Landsman, MD;  Location: [name];  Service: Endoscopy;  Laterality: N/A;   ESOPHAGOGASTRODUODENOSCOPY (EGD) WITH PROPOFOL N/A 11/04/2017   Procedure: ESOPHAGOGASTRODUODENOSCOPY (EGD) WITH PROPOFOL with biopsies;  Surgeon: Janice Landsman, MD;  Location: [name];  Service: Endoscopy;  Laterality: N/A;  sleep apnea   KNEE SURGERY Left    x5, post basketball injury   LUMBAR LAMINECTOMY/DECOMPRESSION MICRODISCECTOMY Left 01/22/2016   Procedure: Laminectomy for facet/synovial cyst - left - Lumbar four - lumbar five;  Surgeon: Janice Larsson, MD;  Location: [place];  Service: Neurosurgery;  Laterality: Left;  Laminectomy for facet/synovial cyst - left - Lumbar four - lumbar five   POLYPECTOMY N/A 11/04/2017   Procedure: POLYPECTOMY INTESTINAL;  Surgeon: Janice Landsman, MD;   Location: [place];  Service: Endoscopy;  Laterality: N/A;   SHOULDER SURGERY Left    x2   SPINE SURGERY N/A    [employer] 08/07/2019   TOE SURGERY Bilateral    bone spurs    Family Psychiatric History: As below   Family History:  Family History  Problem Relation Age of Onset   Heart disease Father    Hyperlipidemia Father    Alcohol abuse Brother    Alcohol abuse Paternal Uncle    Breast cancer Maternal Aunt        70's   Colon cancer Neg Hx     Social History: Single,no children,  lives alone, on disability, focuses on taking care of her elderly parents  Social History   Socioeconomic History   Marital status: Single    Spouse name: Not on file   Number of children: 0   Years of education: Not on file   Highest education level: Bachelor's degree (e.g., BA, AB, BS)  Occupational History   Not on file  Tobacco Use   Smoking status: Former  Packs/day: 1.00    Years: 1.00    Pack years: 1.00    Types: Cigarettes    Quit date: 04/15/1996    Years since quitting: 24.7   Smokeless tobacco: Never   Tobacco comments:    started back again in 2016, smoked for about 6 mo and then quit  Vaping Use   Vaping Use: Never used  Substance and Sexual Activity   Alcohol use: No    Alcohol/week: 0.0 standard drinks    Comment: quit 20 years   Drug use: Yes    Frequency: 14.0 times per week    Types: Marijuana    Comment: 20 yrs. ago- cocaine    Sexual activity: Not Currently  Other Topics Concern   Not on file  Social History Narrative   Not on file   Social Determinants of Health   Financial Resource Strain: Not on file  Food Insecurity: Not on file  Transportation Needs: Not on file  Physical Activity: Not on file  Stress: Not on file  Social Connections: Not on file    Allergies:  Allergies  Allergen Reactions   Other    Effexor [Venlafaxine] Other (See Comments)    UNSPECIFIED REACTION, headaches, felt funny, withdrawal with missed dose     Lamictal [Lamotrigine] Rash    Metabolic Disorder Labs: Lab Results  Component Value Date   HGBA1C 5.6 06/22/2018   No results found for: PROLACTIN Lab Results  Component Value Date   CHOL 168 08/10/2019   TRIG 103 08/10/2019   HDL 71 08/10/2019   CHOLHDL 2.4 08/10/2019   VLDL 22.2 06/22/2018   LDLCALC 79 08/10/2019   LDLCALC 79 06/22/2018   Lab Results  Component Value Date   TSH 1.310 04/05/2020   TSH 1.30 03/15/2015    Therapeutic Level Labs: No results found for: LITHIUM No results found for: VALPROATE No components found for:  CBMZ  Current Medications: Current Outpatient Medications  Medication Sig Dispense Refill   albuterol (VENTOLIN HFA) 108 (90 Base) MCG/ACT inhaler TAKE 2 PUFFS BY MOUTH EVERY 4 TO 6 HOURS AS NEEDED 18 each 1   aspirin 81 MG tablet Take 81 mg by mouth daily.     azelastine (ASTELIN) 0.1 % nasal spray PLACE 2 SPRAYS INTO BOTH NOSTRILS 2 (TWO) TIMES DAILY. 30 mL 5   azithromycin (ZITHROMAX) 250 MG tablet Take 2 tabs PO day 1, then take 1 tab PO once daily 6 tablet 0   benzonatate (TESSALON) 100 MG capsule Take 1 capsule (100 mg total) by mouth 2 (two) times daily as needed for cough. 20 capsule 0   buPROPion (WELLBUTRIN XL) 300 MG 24 hr tablet Take 1 tablet (300 mg total) by mouth daily. 30 tablet 0   calcium-vitamin D (OSCAL WITH D) 500-200 MG-UNIT tablet Take 1 tablet by mouth 3 (three) times daily. 90 tablet 6   cyclobenzaprine (FLEXERIL) 10 MG tablet Take 10 mg by mouth 3 (three) times daily as needed for muscle spasms.     fluticasone (FLONASE) 50 MCG/ACT nasal spray Place 2 sprays into both nostrils daily. 16 g 6   Fluticasone-Salmeterol (WIXELA INHUB) 250-50 MCG/DOSE AEPB Inhale 1 puff into the lungs in the morning and at bedtime. 60 each 2   gabapentin (NEURONTIN) 300 MG capsule Take 1 capsule by mouth 3 (three) times daily.     meloxicam (MOBIC) 7.5 MG tablet Take 1 tablet (7.5 mg total) by mouth 2 (two) times daily as needed for pain. 30  tablet 2  montelukast (SINGULAIR) 10 MG tablet TAKE 1 TABLET BY MOUTH EVERYDAY AT BEDTIME 30 tablet 0   Olopatadine HCl 0.2 % SOLN PLACE 1 DROP INTO BOTH EYES DAILY AS NEEDED. 7.5 mL 1   ondansetron (ZOFRAN ODT) 8 MG disintegrating tablet Take 1 tablet (8 mg total) by mouth every 8 (eight) hours as needed for nausea or vomiting. 20 tablet 0   pantoprazole (PROTONIX) 40 MG tablet TAKE 1 TABLET BY MOUTH EVERY DAY 30 tablet 0   predniSONE (DELTASONE) 50 MG tablet Take 1 tablet (50 mg total) by mouth daily with breakfast. 5 tablet 0   simvastatin (ZOCOR) 20 MG tablet TAKE 1 TABLET BY MOUTH EVERY DAY AT 6PM 90 tablet 1   trazodone (DESYREL) 300 MG tablet Take 1 tablet (300 mg total) by mouth at bedtime as needed for sleep. 30 tablet 0   triamcinolone (NASACORT) 55 MCG/ACT AERO nasal inhaler      WIXELA INHUB 250-50 MCG/ACT AEPB INHALE 1 PUFF TWICE A DAY TO HELP PREVENT COUGH AND WHEEZE. 60 each 1   zonisamide (ZONEGRAN) 100 MG capsule Take 1 capsule (100 mg total) by mouth at bedtime. 30 capsule 0   No current facility-administered medications for this visit.     Musculoskeletal: Strength & Muscle Tone: within normal limits Gait & Station: normal Patient leans: N/A  Psychiatric Specialty Exam: Review of Systems reports chronic back pain, knee pain.   There were no vitals taken for this visit.There is no height or weight on file to calculate BMI.  General Appearance: Well Groomed  Eye Contact:  Good  Speech:  Normal Rate- not pressured or fast   Volume:  Normal  Mood:   reports her mood as stable, denies feeling depressed and affect presents fully reactive /appropriate  Affect:  Appropriate and Full Range  Thought Process:  Linear  Orientation:  Full (Time, Place, and Person)  Thought Content:  denies hallucinations , not internally preoccupied    Suicidal Thoughts:  No denies self injurious or suicidal ideations  Homicidal Thoughts:  No  Memory:   grossly intact   Judgement:  Other:   present   Insight:  Present  Psychomotor Activity:  Normal  Concentration:  Concentration: Good  Recall:  Good  Fund of Knowledge: Good  Language: Good  Akathisia:  Negative  Handed:  Right  AIMS (if indicated): not done  Assets:  Communication Skills Housing Others:  close to her parents, whom she helps care for  ADL's:  Intact  Cognition: WNL  Sleep:   reports stable    Screenings: [place] Office Visit from 04/17/2020 in Primary Care at [facility]  Total Score (max 30 points ) 30      PHQ2-9    [place] Visit from 12/19/2020 in Primary Care at [facility] Visit from 04/17/2020 in Primary Care at [facility] Visit from 06/22/2018 in [place] at [facility] from 05/21/2017 in [place] at [facility] from 09/12/2015 in [place] at [facility]  PHQ-2 Total Score 3 0 0 0 0  PHQ-9 Total Score 13 -- -- -- --        Assessment and Plan:   55 year old female, history of Bipolar Disorder , Borderline P. D  per chart , Alcohol Use Disorder in sustained remission, Cannabis Use Disorder  Had been following with [name] for medication management , who has retired . Reports has continued to take  medications prescribed by [name] and was given bridging scripts.  She reports she has been stable with regards to mood and presents stable, euthymic, without severe neuro-vegetative symptoms or suicidal ideations . She denies hypomanic or manic symptoms and none are noted . She reports she is functioning well in her daily activities ( she is on disability,regularly spends time helping her elderly parents ).  Overall reports she has generally been stable and describes improvement over recent years. States she feels she has developed better coping skills to deal with adversity, has stopped drinking ( now sober for several years ) and has not required psychiatric admission since  approx 2006 .  She reports history of different mood stabilizer trials which were poorly tolerated due to side effects or weight gain . She reports she has been on Zonegran,Wellbutrin XL, Trazodone PRN for insomnia for more than a year without side effects.  At this time her preference is to continue current management as she states it has been well tolerated and more effective than other trials. We reviewed side effect profiles and off label use of Zonegran for mood disorder .  Note BP today was elevated 165/101. No associated symptoms. She reports she often has artificially elevated BP reads due to feeling anxious when it is taken and in Doctors offices . States normally her BP is WNL, and was recorded as 146/88 on 10/19 .States she will have it checked at her PCPs - has upcoming appointment   Renew Welllbutrin XL 300 mgr QDAY for depression ( 1 m, 1 refill)  Renew Zonisamide 100 mgr QHS for mood disorder  ( 1 m,  1 refill )  She is on Trazodone 300 mgr QHS PRN for insomnia, does not need script , I have recommended taper to 150 mgr QHS  Side effects reviewed  Ordered routine BMP / CBC - states she has appointment with her PCP later this week.  Will see in 4-5 weeks  Patient agrees to contact clinic sooner if any worsening or medication concern prior  Jenne Campus, MD 01/01/2021, 11:38 AM

## 2021-01-02 ENCOUNTER — Ambulatory Visit (INDEPENDENT_AMBULATORY_CARE_PROVIDER_SITE_OTHER): Payer: Medicare HMO | Admitting: Family Medicine

## 2021-01-02 ENCOUNTER — Ambulatory Visit: Payer: Medicare HMO | Admitting: Family Medicine

## 2021-01-02 ENCOUNTER — Encounter: Payer: Self-pay | Admitting: Family Medicine

## 2021-01-02 VITALS — BP 131/85 | HR 84 | Temp 98.2°F | Resp 16 | Wt 149.0 lb

## 2021-01-02 DIAGNOSIS — R052 Subacute cough: Secondary | ICD-10-CM

## 2021-01-02 MED ORDER — AZITHROMYCIN 250 MG PO TABS: ORAL_TABLET | ORAL | 0 refills | Status: DC

## 2021-01-02 NOTE — Progress Notes (Signed)
Patient is here for vaccines but wants ABX for cough, cold congestion. Patient will see provider.

## 2021-01-03 ENCOUNTER — Encounter: Payer: Self-pay | Admitting: Family Medicine

## 2021-01-03 NOTE — Progress Notes (Signed)
Established  Patient Office Visit  Subjective:  Patient ID: [name], female    DOB: 07-03-1965  Age: 55 y.o. MRN: [id number]  CC:  Chief Complaint  Patient presents with   Cough    HPI [name] presents for complaint of cough and congestion for the last day or two. She was recently seen for asthma exacerbation but was not given an abx at that time. She did reports improvements but now with increasing sx. Denies fever/chills.   Past Medical History:  Diagnosis Date   Anxiety    Arthritis    neck, knees, shoulders   Asthma    Bipolar disorder ([place])    currently feeling MANIC- 10/02/2016   Depression    GERD (gastroesophageal reflux disease)    Hip fracture ([place]) 06/2019   History of blood transfusion    as a newborn    History of lump of left breast    Hyperlipidemia    Lumbar pseudoarthrosis    Motion sickness    cars   OSA (obstructive sleep apnea) 01/09/2016   can't afford CPAP   Personality disorder ([facility])    PONV (postoperative nausea and vomiting)    Post traumatic stress disorder (PTSD)    Substance abuse ([place])    Synovial cyst       Social History   Socioeconomic History   Marital status: Single    Spouse name: Not on file   Number of children: 0   Years of education: Not on file   Highest education level: Bachelor's degree (e.g., BA, AB, BS)  Occupational History   Not on file  Tobacco Use   Smoking status: Former    Packs/day: 1.00    Years: 1.00    Pack years: 1.00    Types: Cigarettes    Quit date: 04/15/1996    Years since quitting: 24.7   Smokeless tobacco: Never   Tobacco comments:    started back again in 2016, smoked for about 6 mo and then quit  Vaping Use   Vaping Use: Never used  Substance and Sexual Activity   Alcohol use: No    Alcohol/week: 0.0 standard drinks    Comment: quit 20 years   Drug use: Yes    Frequency: 14.0 times per week    Types: Marijuana    Comment: 20 yrs. ago- cocaine    Sexual activity: Not  Currently  Other Topics Concern   Not on file  Social History Narrative   Not on file   Social Determinants of Health   Financial Resource Strain: Not on file  Food Insecurity: Not on file  Transportation Needs: Not on file  Physical Activity: Not on file  Stress: Not on file  Social Connections: Not on file  Intimate Partner Violence: Not on file    ROS Review of Systems  Constitutional:  Negative for chills and fever.  HENT:  Positive for congestion.   Respiratory:  Positive for cough and wheezing.   All other systems reviewed and are negative.  Objective:   Today's Vitals: BP 131/85   Pulse 84   Temp 98.2 F (36.8 C) (Oral)   Resp 16   Wt 149 lb (67.6 kg)   SpO2 (!) 16%   BMI 28.15 kg/m   Physical Exam Vitals and nursing note reviewed.  Constitutional:      General: She is not in acute distress. Cardiovascular:     Rate and Rhythm: Normal rate and regular rhythm.  Pulmonary:  Effort: Pulmonary effort is normal. No respiratory distress.     Breath sounds: Normal breath sounds. No wheezing.  Abdominal:     Palpations: Abdomen is soft.     Tenderness: There is no abdominal tenderness.  Neurological:     General: No focal deficit present.     Mental Status: She is alert and oriented to person, place, and time.    Assessment & Plan:   1. Subacute cough As patient has a history of asthma with recent steroids  and covid/flu negative will prescribe course of zithromax and monitor. Continue routine asthma management.     Outpatient Encounter Medications as of 01/02/2021  Medication Sig   albuterol (VENTOLIN HFA) 108 (90 Base) MCG/ACT inhaler TAKE 2 PUFFS BY MOUTH EVERY 4 TO 6 HOURS AS NEEDED   aspirin 81 MG tablet Take 81 mg by mouth daily.   azelastine (ASTELIN) 0.1 % nasal spray PLACE 2 SPRAYS INTO BOTH NOSTRILS 2 (TWO) TIMES DAILY.   azithromycin (ZITHROMAX) 250 MG tablet Take 2 tabs PO day 1, then take 1 tab PO once daily   benzonatate (TESSALON) 100  MG capsule Take 1 capsule (100 mg total) by mouth 2 (two) times daily as needed for cough.   buPROPion (WELLBUTRIN XL) 300 MG 24 hr tablet Take 1 tablet (300 mg total) by mouth daily.   calcium-vitamin D (OSCAL WITH D) 500-200 MG-UNIT tablet Take 1 tablet by mouth 3 (three) times daily.   cyclobenzaprine (FLEXERIL) 10 MG tablet Take 10 mg by mouth 3 (three) times daily as needed for muscle spasms.   fluticasone (FLONASE) 50 MCG/ACT nasal spray Place 2 sprays into both nostrils daily.   Fluticasone-Salmeterol (WIXELA INHUB) 250-50 MCG/DOSE AEPB Inhale 1 puff into the lungs in the morning and at bedtime.   gabapentin (NEURONTIN) 300 MG capsule Take 1 capsule by mouth 3 (three) times daily.   meloxicam (MOBIC) 7.5 MG tablet Take 1 tablet (7.5 mg total) by mouth 2 (two) times daily as needed for pain.   montelukast (SINGULAIR) 10 MG tablet TAKE 1 TABLET BY MOUTH EVERYDAY AT BEDTIME   Olopatadine HCl 0.2 % SOLN PLACE 1 DROP INTO BOTH EYES DAILY AS NEEDED.   ondansetron (ZOFRAN ODT) 8 MG disintegrating tablet Take 1 tablet (8 mg total) by mouth every 8 (eight) hours as needed for nausea or vomiting.   pantoprazole (PROTONIX) 40 MG tablet TAKE 1 TABLET BY MOUTH EVERY DAY   predniSONE (DELTASONE) 50 MG tablet Take 1 tablet (50 mg total) by mouth daily with breakfast. (Patient not taking: Reported on 01/01/2021)   simvastatin (ZOCOR) 20 MG tablet TAKE 1 TABLET BY MOUTH EVERY DAY AT 6PM   trazodone (DESYREL) 300 MG tablet Take 1 tablet (300 mg total) by mouth at bedtime as needed for sleep.   triamcinolone (NASACORT) 55 MCG/ACT AERO nasal inhaler    WIXELA INHUB 250-50 MCG/ACT AEPB INHALE 1 PUFF TWICE A DAY TO HELP PREVENT COUGH AND WHEEZE.   zonisamide (ZONEGRAN) 100 MG capsule Take 1 capsule (100 mg total) by mouth at bedtime.   [DISCONTINUED] azithromycin (ZITHROMAX) 250 MG tablet Take 2 tabs PO day 1, then take 1 tab PO once daily   No facility-administered encounter medications on file as of 01/02/2021.     Follow-up: No follow-ups on file.   Becky Sax, MD

## 2021-01-07 ENCOUNTER — Other Ambulatory Visit (HOSPITAL_COMMUNITY): Payer: Self-pay

## 2021-01-07 DIAGNOSIS — Z79899 Other long term (current) drug therapy: Secondary | ICD-10-CM

## 2021-01-10 DIAGNOSIS — Z79899 Other long term (current) drug therapy: Secondary | ICD-10-CM | POA: Diagnosis not present

## 2021-01-10 DIAGNOSIS — S32010A Wedge compression fracture of first lumbar vertebra, initial encounter for closed fracture: Secondary | ICD-10-CM | POA: Diagnosis not present

## 2021-01-11 ENCOUNTER — Encounter: Payer: Self-pay | Admitting: Family Medicine

## 2021-01-11 LAB — CBC WITH DIFFERENTIAL
Basophils Absolute: 0 10*3/uL (ref 0.0–0.2)
Basos: 1 %
EOS (ABSOLUTE): 0.2 10*3/uL (ref 0.0–0.4)
Eos: 2 %
Hematocrit: 38.4 % (ref 34.0–46.6)
Hemoglobin: 13.1 g/dL (ref 11.1–15.9)
Immature Grans (Abs): 0 10*3/uL (ref 0.0–0.1)
Immature Granulocytes: 0 %
Lymphocytes Absolute: 2.1 10*3/uL (ref 0.7–3.1)
Lymphs: 28 %
MCH: 30.6 pg (ref 26.6–33.0)
MCHC: 34.1 g/dL (ref 31.5–35.7)
MCV: 90 fL (ref 79–97)
Monocytes Absolute: 0.4 10*3/uL (ref 0.1–0.9)
Monocytes: 6 %
Neutrophils Absolute: 4.6 10*3/uL (ref 1.4–7.0)
Neutrophils: 63 %
RBC: 4.28 x10E6/uL (ref 3.77–5.28)
RDW: 12.9 % (ref 11.7–15.4)
WBC: 7.3 10*3/uL (ref 3.4–10.8)

## 2021-01-11 LAB — BASIC METABOLIC PANEL
BUN/Creatinine Ratio: 8 — ABNORMAL LOW (ref 9–23)
BUN: 7 mg/dL (ref 6–24)
CO2: 24 mmol/L (ref 20–29)
Calcium: 9.5 mg/dL (ref 8.7–10.2)
Chloride: 104 mmol/L (ref 96–106)
Creatinine, Ser: 0.88 mg/dL (ref 0.57–1.00)
Glucose: 96 mg/dL (ref 70–99)
Potassium: 4.7 mmol/L (ref 3.5–5.2)
Sodium: 139 mmol/L (ref 134–144)
eGFR: 77 mL/min/{1.73_m2} (ref >59)

## 2021-01-12 ENCOUNTER — Other Ambulatory Visit (HOSPITAL_COMMUNITY): Payer: Self-pay | Admitting: Psychiatry

## 2021-01-26 NOTE — Patient Instructions (Addendum)
Allergic rhinitis with conjunctivitis (grass pollen, weed pollen, molds, and mouse) Continue Singulair 10 mg once a day Continue Xyzal 5 mg once a day as needed Continue Pataday 1 drop each eye once a day as needed for itchy watery eyes Stop Astelin nasal spray since this makes you vomit Start ipratropium bromide nasal spray using 2 sprays each nostril twice a day as needed for runny nose/drainage down throat Continue fluticasone nasal spray, but decrease to 2 sprays each nostril once a day as needed for stuffy nose. May use saline nasal rinse as needed for nasal symptoms. Use this prior to any medicated nasal spray.  Moderate persistent asthma  Start Augmentin 875 mg taking 1 tablet twice a day for 7 days Stop Wixela 250-50  Start Breztri 2 puffs twice a day with spacer to help prevent cough and wheeze. (Samples given). spacer given along with demonstration. Consider a biologic in the future as your AEC was 200 from November cbc with diff Continue Singulair 10 mg as above. We will refill this May use albuterol 2 puffs every 4 hours as needed for cough, wheeze, tightness in chest, or shortness of breath. Also, may use albuterol 2 puffs 5-15 minutes prior to exercise  Recurrent sinusitis( received pneumovax on 05/30/2019) It does not look like you got your repeat blood work after you received your Pneumovax on 05/30/19. We will get this blood work today and call you once the results are back.  Reflux Continue pantoprazole as prescribed Continue dietary and lifestyle modifications as below  Please let us know if this treatment plan is not working well for you. Schedule a follow up appointment in 4 weeks with Dr. Nelva Bush or sooner if needed  Lifestyle Changes for Controlling GERD When you have GERD, stomach acid feels as if it's backing up toward your mouth. Whether or not you take medication to control your GERD, your symptoms can often be improved with lifestyle changes.   Raise Your  Head Reflux is more likely to strike when you're lying down flat, because stomach fluid can flow backward more easily. Raising the head of your bed 4-6 inches can help. To do this: Slide blocks or books under the legs at the head of your bed. Or, place a wedge under the mattress. Many foam stores can make a suitable wedge for you. The wedge should run from your waist to the top of your head. Don't just prop your head on several pillows. This increases pressure on your stomach. It can make GERD worse.  Watch Your Eating Habits Certain foods may increase the acid in your stomach or relax the lower esophageal sphincter, making GERD more likely. It's best to avoid the following: Coffee, tea, and carbonated drinks (with and without caffeine) Fatty, fried, or spicy food Mint, chocolate, onions, and tomatoes Any other foods that seem to irritate your stomach or cause you pain  Relieve the Pressure Eat smaller meals, even if you have to eat more often. Don't lie down right after you eat. Wait a few hours for your stomach to empty. Avoid tight belts and tight-fitting clothes. Lose excess weight.  Tobacco and Alcohol Avoid smoking tobacco and drinking alcohol. They can make GERD symptoms worse.

## 2021-01-28 ENCOUNTER — Other Ambulatory Visit: Payer: Self-pay

## 2021-01-28 ENCOUNTER — Ambulatory Visit: Payer: Medicare HMO | Admitting: Family

## 2021-01-28 ENCOUNTER — Encounter: Payer: Self-pay | Admitting: Family

## 2021-01-28 VITALS — BP 118/80 | HR 83 | Temp 96.7°F | Resp 18 | Ht 61.0 in | Wt 153.0 lb

## 2021-01-28 DIAGNOSIS — J3089 Other allergic rhinitis: Secondary | ICD-10-CM | POA: Diagnosis not present

## 2021-01-28 DIAGNOSIS — J329 Chronic sinusitis, unspecified: Secondary | ICD-10-CM | POA: Diagnosis not present

## 2021-01-28 DIAGNOSIS — K219 Gastro-esophageal reflux disease without esophagitis: Secondary | ICD-10-CM

## 2021-01-28 DIAGNOSIS — J4540 Moderate persistent asthma, uncomplicated: Secondary | ICD-10-CM

## 2021-01-28 DIAGNOSIS — J45998 Other asthma: Secondary | ICD-10-CM | POA: Diagnosis not present

## 2021-01-28 DIAGNOSIS — H1013 Acute atopic conjunctivitis, bilateral: Secondary | ICD-10-CM | POA: Diagnosis not present

## 2021-01-28 DIAGNOSIS — J45909 Unspecified asthma, uncomplicated: Secondary | ICD-10-CM | POA: Diagnosis not present

## 2021-01-28 MED ORDER — MONTELUKAST SODIUM 10 MG PO TABS: ORAL_TABLET | ORAL | 5 refills | Status: DC

## 2021-01-28 MED ORDER — OLOPATADINE HCL 0.2 % OP SOLN: OPHTHALMIC | 5 refills | Status: DC

## 2021-01-28 MED ORDER — BREZTRI AEROSPHERE 160-9-4.8 MCG/ACT IN AERO: INHALATION_SPRAY | RESPIRATORY_TRACT | 3 refills | Status: DC

## 2021-01-28 MED ORDER — AMOXICILLIN-POT CLAVULANATE 875-125 MG PO TABS: 1.0000 | ORAL_TABLET | Freq: Two times a day (BID) | ORAL | 0 refills | Status: DC

## 2021-01-28 MED ORDER — IPRATROPIUM BROMIDE 0.03 % NA SOLN: NASAL | 3 refills | Status: DC

## 2021-01-28 NOTE — Progress Notes (Signed)
[place] [address] Dept: [phone]  FOLLOW UP NOTE  Patient ID: [name], female    DOB: 08/19/1965  Age: 55 y.o. MRN: [id number] Date of Office Visit: 01/28/2021  Assessment  Chief Complaint: Follow-up (PT COMPLAINTS OF BAD NASAL CONGESTION started September AND is pretty sure has a sinus infection. Took antibiotics and steroids. Act 11.)  HPI Rachal Dvorsky is a 55 year old female who presents today for an acute visit of chest congestion and nasal congestion.  She was last seen on April 02, 2020 by Althea Charon, FNP for allergic rhinitis with conjunctivitis, moderate persistent asthma, and recurrent sinusitis.  Moderate persistent asthma is reported as not well controlled with Wixela 250/50-1 puff twice a day and albuterol as needed.  She has been out of Singulair 10 mg once a day since last week.  She reports a cough that can be dry and at times productive.  When she is able to get something up it is brownish-grayish-greenish in color.  She reports that it has been this way since September.  She also reports wheezing, shortness of breath at times, and a little bit of nocturnal awakenings due to cough.  She denies tightness in her chest.  She reports that she was placed on a steroid on October 19 for these symptoms and then on November 2 was given azithromycin.  Both of these medications helped some but she feels that it did not get all the way out.  When asked how many rounds of steroids she has been on this year she estimates 2 or 3 different rounds but it could be less.  Since her last office visit she has not made any trips to the emergency room or urgent care due to breathing problems.  She sometimes uses her albuterol inhaler twice a day.  Since her last office visit she had a chest x-ray on April 05, 2020 showing: "IMPRESSION: No active cardiopulmonary disease.   New L1 compression fracture.   New lucency in the central chest appears to extend  above the diaphragmatic contour and may be a loop of bowel herniated into the chest anteriorly. Unlikely to represent free air based on appearance; however, consider CT for further evaluation particularly if symptoms are anterior.   Call is out to the referring provider to further discuss findings in the above case.   Electronically Signed: By: Zetta Bills M.D. On: 04/05/2020 16:03  ADDENDUM: Compression fracture noted on the initial report is present on the study of December 14, 2019 perhaps with slight increased loss of height. Correlate with any pain in this area to suggest worsening.   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 CT of the chest is suggested for further assessment. Findings discussed with the provider as outlined below.   These results were called by telephone at the time of interpretation on 04/05/2020 at 4:17 pm to provider [facility] , who verbally acknowledged these results.     Electronically Signed   By: Zetta Bills M.D.   On: 04/05/2020 16:17  She also had a CT chest with contrast on April 13, 2020 showing: "  IMPRESSION: 1. 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 There is associated herniation of intra-abdominal fat anterior to the right ventricle. No bowel involvement. No upper abdominal bowel loops in the vicinity on the current exam. 2. Fracture  of left twelfth rib at the costovertebral junction with minimal surrounding callus formation, likely subacute or remote. 3. L1 superior endplate compression fracture with approximately 30% loss of height, unchanged from prior radiograph. Nondisplaced T12 spinous process fracture, appears remote.   Aortic Atherosclerosis (ICD10-I70.0).     Electronically Signed   By: Keith Rake M.D.   On: 04/14/2020 20:56   Allergic  rhinitis with conjunctivitis is reported as not well controlled with Xyzal 5 mg once a day, fluticasone nasal spray as needed and saline rinses as needed.  She has been out of Singulair 10 mg once a day since last week.  She reports that Astelin nasal spray makes her throw up.  She reports clear rhinorrhea, nasal congestion, and postnasal drip at times.  She also reports a sore throat at times that she feels like is due to a cough.  She thinks that she may have a sinus infection.  When asked how many sinus infection she has had since we last saw her she reports that she does not have any idea.  She reports that she has seen ENT in the past and was told that he could tell where her nose was broken.  She had sinus imaging on May 25, 2019 that shows: "  IMPRESSION: Minor mucosal thickening of ethmoid air cells. Paranasal sinuses are otherwise clear."  She does not remember if she ever did the lab work to follow-up on the Pneumovax she received on May 30, 2019.  Drug Allergies:  Allergies  Allergen Reactions   Other    Effexor [Venlafaxine] Other (See Comments)    UNSPECIFIED REACTION, headaches, felt funny, withdrawal with missed dose    Lamictal [Lamotrigine] Rash    Review of Systems: Review of Systems  Constitutional:  Negative for chills and fever.  HENT:         Reports clear rhinorrhea at times, nasal congestion, and postnasal drip at times.  Eyes:        Reports itchy watery eyes at times  Respiratory:  Positive for cough, shortness of breath and wheezing.        Reports cough that can be dry and productive at times.  Also reports wheezing, shortness of breath at times, and nocturnal awakenings due to cough.  Denies tightness in chest.  Cardiovascular:  Negative for chest pain and palpitations.  Gastrointestinal:        Reports reflux at times.  Currently taking pantoprazole in the morning  Musculoskeletal:        Denies sick contact and body aches  Skin:  Negative for itching  and rash.  Neurological:  Negative for headaches.  Endo/Heme/Allergies:  Positive for environmental allergies.    Physical Exam: BP 118/80   Pulse 83   Temp (!) 96.7 F (35.9 C) (Temporal)   Resp 18   Ht 5\' 1"  (8.756 m)   Wt 153 lb (69.4 kg)   SpO2 98%   BMI 28.91 kg/m    Physical Exam Constitutional:      Appearance: Normal appearance.  HENT:     Head: Normocephalic and atraumatic.     Comments: Pharynx normal.  Graphic tongue noted, eyes normal, ears normal, nose: Bilateral lower turbinates mildly edematous and slightly erythematous with clear drainage noted.  Septal deviation noted.    Right Ear: Tympanic membrane, ear canal and external ear normal.     Left Ear: Tympanic membrane, ear canal and external ear normal.     Mouth/Throat:     Mouth:  Mucous membranes are moist.     Pharynx: Oropharynx is clear.  Eyes:     Conjunctiva/sclera: Conjunctivae normal.  Cardiovascular:     Rate and Rhythm: Regular rhythm.     Heart sounds: Normal heart sounds.  Pulmonary:     Effort: Pulmonary effort is normal.     Breath sounds: Normal breath sounds.     Comments: Lungs clear to auscultation Musculoskeletal:     Cervical back: Neck supple.  Skin:    General: Skin is warm.  Neurological:     Mental Status: She is alert and oriented to person, place, and time.  Psychiatric:        Mood and Affect: Mood normal.        Behavior: Behavior normal.        Thought Content: Thought content normal.        Judgment: Judgment normal.    Diagnostics: FVC 3.22 L, FEV1 2.63 L.  Predicted FVC 3.05 L, predicted FEV1 2.38 L.  Spirometry indicates normal ventilatory function.  Assessment and Plan: 1. Not well controlled moderate persistent asthma   2. Recurrent sinusitis   3. Non-seasonal allergic rhinitis due to other allergic trigger   4. Allergic conjunctivitis of both eyes   5. Gastroesophageal reflux disease, unspecified whether esophagitis present     Meds ordered this  encounter  Medications   ipratropium (ATROVENT) 0.03 % nasal spray    Sig: Place 2 sprays in each nostril twice a day as needed for runny nose/drainage down throat    Dispense:  30 mL    Refill:  3   amoxicillin-clavulanate (AUGMENTIN) 875-125 MG tablet    Sig: Take 1 tablet by mouth 2 (two) times daily.    Dispense:  14 tablet    Refill:  0   Budeson-Glycopyrrol-Formoterol (BREZTRI AEROSPHERE) 160-9-4.8 MCG/ACT AERO    Sig: Inhale 2 puffs twice a day with spacer to help prevent cough and wheeze    Dispense:  10.7 g    Refill:  3   montelukast (SINGULAIR) 10 MG tablet    Sig: TAKE 1 TABLET BY MOUTH EVERYDAY AT BEDTIME    Dispense:  30 tablet    Refill:  5   Olopatadine HCl 0.2 % SOLN    Sig: Place 1 drop in each eye once a day as needed for itchy watery eyes    Dispense:  7.5 mL    Refill:  5    Patient Instructions  Allergic rhinitis with conjunctivitis (grass pollen, weed pollen, molds, and mouse) Continue Singulair 10 mg once a day Continue Xyzal 5 mg once a day as needed Continue Pataday 1 drop each eye once a day as needed for itchy watery eyes Stop Astelin nasal spray since this makes you vomit Start ipratropium bromide nasal spray using 2 sprays each nostril twice a day as needed for runny nose/drainage down throat Continue fluticasone nasal spray, but decrease to 2 sprays each nostril once a day as needed for stuffy nose. May use saline nasal rinse as needed for nasal symptoms. Use this prior to any medicated nasal spray.  Moderate persistent asthma  Start Augmentin 875 mg taking 1 tablet twice a day for 7 days Stop Wixela 250-50  Start Breztri 2 puffs twice a day with spacer to help prevent cough and wheeze. (Samples given). spacer given along with demonstration. Consider a biologic in the future as your AEC was 200 from November cbc with diff Continue Singulair 10 mg as above. We will refill  this May use albuterol 2 puffs every 4 hours as needed for cough, wheeze,  tightness in chest, or shortness of breath. Also, may use albuterol 2 puffs 5-15 minutes prior to exercise  Recurrent sinusitis( received pneumovax on 05/30/2019) It does not look like you got your repeat blood work after you received your Pneumovax on 05/30/19. We will get this blood work today and call you once the results are back.  Reflux Continue pantoprazole as prescribed Continue dietary and lifestyle modifications as below  Please let us know if this treatment plan is not working well for you. Schedule a follow up appointment in 4 weeks with Dr. Nelva Bush or sooner if needed  Lifestyle Changes for Controlling GERD When you have GERD, stomach acid feels as if it's backing up toward your mouth. Whether or not you take medication to control your GERD, your symptoms can often be improved with lifestyle changes.   Raise Your Head Reflux is more likely to strike when you're lying down flat, because stomach fluid can flow backward more easily. Raising the head of your bed 4-6 inches can help. To do this: Slide blocks or books under the legs at the head of your bed. Or, place a wedge under the mattress. Many foam stores can make a suitable wedge for you. The wedge should run from your waist to the top of your head. Don't just prop your head on several pillows. This increases pressure on your stomach. It can make GERD worse.  Watch Your Eating Habits Certain foods may increase the acid in your stomach or relax the lower esophageal sphincter, making GERD more likely. It's best to avoid the following: Coffee, tea, and carbonated drinks (with and without caffeine) Fatty, fried, or spicy food Mint, chocolate, onions, and tomatoes Any other foods that seem to irritate your stomach or cause you pain  Relieve the Pressure Eat smaller meals, even if you have to eat more often. Don't lie down right after you eat. Wait a few hours for your stomach to empty. Avoid tight belts and tight-fitting  clothes. Lose excess weight.  Tobacco and Alcohol Avoid smoking tobacco and drinking alcohol. They can make GERD symptoms worse.  Return in about 4 weeks (around 02/25/2021), or if symptoms worsen or fail to improve.    Thank you for the opportunity to care for this patient.  Please do not hesitate to contact me with questions.  Althea Charon, FNP [facility] [place] [facility] [place]

## 2021-01-28 NOTE — Progress Notes (Signed)
1 

## 2021-01-29 ENCOUNTER — Other Ambulatory Visit (HOSPITAL_COMMUNITY): Payer: Self-pay | Admitting: Psychiatry

## 2021-01-29 ENCOUNTER — Other Ambulatory Visit: Payer: Self-pay | Admitting: Orthopaedic Surgery

## 2021-01-31 LAB — STREP PNEUMONIAE 23 SEROTYPES IGG
Pneumo Ab Type 1*: 0.5 ug/mL — ABNORMAL LOW (ref >1.3)
Pneumo Ab Type 12 (12F)*: <0.1 ug/mL — ABNORMAL LOW (ref >1.3)
Pneumo Ab Type 14*: 0.7 ug/mL — ABNORMAL LOW (ref >1.3)
Pneumo Ab Type 17 (17F)*: 1.8 ug/mL (ref >1.3)
Pneumo Ab Type 19 (19F)*: 0.9 ug/mL — ABNORMAL LOW (ref >1.3)
Pneumo Ab Type 2*: 2.3 ug/mL (ref >1.3)
Pneumo Ab Type 20*: 4.7 ug/mL (ref >1.3)
Pneumo Ab Type 22 (22F)*: 0.3 ug/mL — ABNORMAL LOW (ref >1.3)
Pneumo Ab Type 23 (23F)*: <0.1 ug/mL — ABNORMAL LOW (ref >1.3)
Pneumo Ab Type 26 (6B)*: 0.7 ug/mL — ABNORMAL LOW (ref >1.3)
Pneumo Ab Type 3*: 0.8 ug/mL — ABNORMAL LOW (ref >1.3)
Pneumo Ab Type 34 (10A)*: 0.8 ug/mL — ABNORMAL LOW (ref >1.3)
Pneumo Ab Type 4*: 0.9 ug/mL — ABNORMAL LOW (ref >1.3)
Pneumo Ab Type 43 (11A)*: 0.1 ug/mL — ABNORMAL LOW (ref >1.3)
Pneumo Ab Type 5*: <0.1 ug/mL — ABNORMAL LOW (ref >1.3)
Pneumo Ab Type 51 (7F)*: 0.5 ug/mL — ABNORMAL LOW (ref >1.3)
Pneumo Ab Type 54 (15B)*: 2.3 ug/mL (ref >1.3)
Pneumo Ab Type 56 (18C)*: 3.2 ug/mL (ref >1.3)
Pneumo Ab Type 57 (19A)*: 1 ug/mL — ABNORMAL LOW (ref >1.3)
Pneumo Ab Type 68 (9V)*: 0.5 ug/mL — ABNORMAL LOW (ref >1.3)
Pneumo Ab Type 70 (33F)*: 0.3 ug/mL — ABNORMAL LOW (ref >1.3)
Pneumo Ab Type 8*: 0.7 ug/mL — ABNORMAL LOW (ref >1.3)
Pneumo Ab Type 9 (9N)*: 0.2 ug/mL — ABNORMAL LOW (ref >1.3)

## 2021-02-01 NOTE — Progress Notes (Signed)
Discussed with Althea Charon. Streptococcal titers largely unchanged. Ordered a Streptococcal avidity assay. Ernst Bowler knows how to draw and send this.   Salvatore Marvel, MD [facility] [place] [facility] [place]

## 2021-02-01 NOTE — Addendum Note (Signed)
Addended by: Valentina Shaggy on: 02/01/2021 08:48 AM   Modules accepted: Orders

## 2021-02-01 NOTE — Progress Notes (Signed)
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 She already received her pneumovax on 05/30/2019. Her previous lab work shows that she was protective to 6 out of 23 types.  I spoke with Dr. Ernst Bowler and he would like for her to come by the [facility] office to get another lab drawn (streptococcal avidity assay). This will tell us how well the titers are functioning.  Her next follow up appointment is with me in 4 weeks. Please change her follow up appointment to Dr. Nelva Bush.

## 2021-02-11 ENCOUNTER — Ambulatory Visit (HOSPITAL_BASED_OUTPATIENT_CLINIC_OR_DEPARTMENT_OTHER): Payer: Medicare HMO | Admitting: Psychiatry

## 2021-02-11 ENCOUNTER — Other Ambulatory Visit: Payer: Self-pay

## 2021-02-11 ENCOUNTER — Encounter (HOSPITAL_COMMUNITY): Payer: Self-pay | Admitting: Psychiatry

## 2021-02-11 ENCOUNTER — Other Ambulatory Visit: Payer: Self-pay | Admitting: Family

## 2021-02-11 DIAGNOSIS — F3175 Bipolar disorder, in partial remission, most recent episode depressed: Secondary | ICD-10-CM | POA: Diagnosis not present

## 2021-02-11 MED ORDER — BUPROPION HCL ER (XL) 300 MG PO TB24
300.0000 mg | ORAL_TABLET | Freq: Every day | ORAL | 1 refills | Status: DC
Start: 2021-02-11 — End: 2021-03-12

## 2021-02-11 MED ORDER — ZONISAMIDE 100 MG PO CAPS: 100.0000 mg | ORAL_CAPSULE | Freq: Every day | ORAL | 1 refills | Status: DC

## 2021-02-11 NOTE — Progress Notes (Signed)
BH MD/PA/NP OP Progress Note  02/11/2021 10:07 AM [name]  MRN:  [id number]  Chief Complaint:   medication management  Face to face in office visit . Duration 25 minutes  HPI: Ms . [name] is a 55 year old female . She was seeing Dr.Pucilowski for outpatient psychiatric management . He has retired ( last seen by Dr [name]. On 03/15/2020 ) .  55 year old single female, lives alone, on disability.   Ms [name] reports she has been doing " all right". She states her mood has been stable and denies significant neuro-vegetative symptoms. Denies hypomanic or manic symptomatology, and her affect presents stable . Denies anhedonia and reports she enjoys spending time with her family and with her pet dog . Denies suicidal ideations and presents future oriented, stating she may travel to [place] to spend time with family for holidays . She reports she is sleeping " all right" on most nights , but states that in general she gets by with little sleep and states " I have never been much of a sleeper". Remote history of alcohol use disorder- reports stopped and has been abstinent since 2006 . She is prescribed Morphine for chronic pain ( has had lower back surgeries /has hardware on lumbar spine ) by her pain specialist . Denies abusing or misusing, and states takes less than prescribed, usually BID. Denies sedation .  We reviewed medications - reports she has been on the same medication combination for more than two years and feels it " works all right for me". Denies side effects. She reports she has done better with Zonisamide ( has been on it for 2 years ) than with other medications and reports she feels it has helped stabilize her mood more effectively than other medications she had tried in the past n. She is aware that it is an off label indication and we have reviewed side effect profile , to include potential for severe rash, metabolic acidosis, and even potential risk of depression. We have also reviewed  potential risk of seizure if stopped abruptly . ( Of note, patient denies any history of seizures ) .  She reports Wellbutrin has been effective and well tolerated , denies side effects. Have reviewed side effects, including potential risk of seizures . With regards to Trazodone , reports she is now taking it only occasionally and at 150 mgr QHS when she does . Denies side effects. We have reviewed potential for sedation and possible drug drug interactions , increased risk of sedation in association with opiate meds .  Labs reviewed - 03/22 EKG QTc 383 11/10 CBC unremarkable 11/10 BUN 7, Creat 0.88, CO224          Visit Diagnosis: Bipolar Disorder II by history   Past Psychiatric History: Reports history of Bipolar Disorder diagnosis. Chart notes also indicate history of Borderline Personality Disorder .  Past history of Alcohol Use Disorder , reports has been sober /abstinent for several years .   Past Medical History:  Past Medical History:  Diagnosis Date   Anxiety    Arthritis    neck, knees, shoulders   Asthma    Bipolar disorder ([place])    currently feeling MANIC- 10/02/2016   Depression    GERD (gastroesophageal reflux disease)    Hip fracture ([place]) 06/2019   History of blood transfusion    as a newborn    History of lump of left breast    Hyperlipidemia    Lumbar pseudoarthrosis  Motion sickness    cars   OSA (obstructive sleep apnea) 01/09/2016   can't afford CPAP   Personality disorder (HCC)    PONV (postoperative nausea and vomiting)    Post traumatic stress disorder (PTSD)    Substance abuse ([place])    Synovial cyst     Past Surgical History:  Procedure Laterality Date   COLONOSCOPY WITH PROPOFOL N/A 11/04/2017   Procedure: COLONOSCOPY WITH PROPOFOL;  Surgeon: Lin Landsman, MD;  Location: [place];  Service: Endoscopy;  Laterality: N/A;   ESOPHAGOGASTRODUODENOSCOPY (EGD) WITH PROPOFOL N/A 11/04/2017   Procedure: ESOPHAGOGASTRODUODENOSCOPY (EGD)  WITH PROPOFOL with biopsies;  Surgeon: Lin Landsman, MD;  Location: [place];  Service: Endoscopy;  Laterality: N/A;  sleep apnea   KNEE SURGERY Left    x5, post basketball injury   LUMBAR LAMINECTOMY/DECOMPRESSION MICRODISCECTOMY Left 01/22/2016   Procedure: Laminectomy for facet/synovial cyst - left - Lumbar four - lumbar five;  Surgeon: Earnie Larsson, MD;  Location: [place];  Service: Neurosurgery;  Laterality: Left;  Laminectomy for facet/synovial cyst - left - Lumbar four - lumbar five   POLYPECTOMY N/A 11/04/2017   Procedure: POLYPECTOMY INTESTINAL;  Surgeon: Lin Landsman, MD;  Location: [place];  Service: Endoscopy;  Laterality: N/A;   SHOULDER SURGERY Left    x2   SPINE SURGERY N/A    Phreesia 08/07/2019   TOE SURGERY Bilateral    bone spurs    Family Psychiatric History: As below   Family History:  Family History  Problem Relation Age of Onset   Heart disease Father    Hyperlipidemia Father    Alcohol abuse Brother    Alcohol abuse Paternal Uncle    Breast cancer Maternal Aunt        70's   Colon cancer Neg Hx     Social History: Single,no children,  lives alone, on disability, focuses on taking care of her elderly parents  Social History   Socioeconomic History   Marital status: Single    Spouse name: Not on file   Number of children: 0   Years of education: Not on file   Highest education level: Bachelor's degree (e.g., BA, AB, BS)  Occupational History   Not on file  Tobacco Use   Smoking status: Former    Packs/day: 1.00    Years: 1.00    Pack years: 1.00    Types: Cigarettes    Quit date: 04/15/1996    Years since quitting: 24.8   Smokeless tobacco: Never   Tobacco comments:    started back again in 2016, smoked for about 6 mo and then quit  Vaping Use   Vaping Use: Never used  Substance and Sexual Activity   Alcohol use: No    Alcohol/week: 0.0 standard drinks    Comment: quit 20 years   Drug use: Yes    Frequency:  14.0 times per week    Types: Marijuana    Comment: 20 yrs. ago- cocaine    Sexual activity: Not Currently  Other Topics Concern   Not on file  Social History Narrative   Not on file   Social Determinants of Health   Financial Resource Strain: Not on file  Food Insecurity: Not on file  Transportation Needs: Not on file  Physical Activity: Not on file  Stress: Not on file  Social Connections: Not on file    Allergies:  Allergies  Allergen Reactions   Other    Effexor [Venlafaxine] Other (See  Comments)    UNSPECIFIED REACTION, headaches, felt funny, withdrawal with missed dose    Lamictal [Lamotrigine] Rash    Metabolic Disorder Labs: Lab Results  Component Value Date   HGBA1C 5.6 06/22/2018   No results found for: PROLACTIN Lab Results  Component Value Date   CHOL 168 08/10/2019   TRIG 103 08/10/2019   HDL 71 08/10/2019   CHOLHDL 2.4 08/10/2019   VLDL 22.2 06/22/2018   LDLCALC 79 08/10/2019   LDLCALC 79 06/22/2018   Lab Results  Component Value Date   TSH 1.310 04/05/2020   TSH 1.30 03/15/2015    Therapeutic Level Labs: No results found for: LITHIUM No results found for: VALPROATE No components found for:  CBMZ  Current Medications: Current Outpatient Medications  Medication Sig Dispense Refill   albuterol (VENTOLIN HFA) 108 (90 Base) MCG/ACT inhaler TAKE 2 PUFFS BY MOUTH EVERY 4 TO 6 HOURS AS NEEDED 18 each 1   amoxicillin-clavulanate (AUGMENTIN) 875-125 MG tablet Take 1 tablet by mouth 2 (two) times daily. 14 tablet 0   aspirin 81 MG tablet Take 81 mg by mouth daily.     azithromycin (ZITHROMAX) 250 MG tablet Take 2 tabs PO day 1, then take 1 tab PO once daily (Patient not taking: Reported on 01/28/2021) 6 tablet 0   benzonatate (TESSALON) 100 MG capsule Take 1 capsule (100 mg total) by mouth 2 (two) times daily as needed for cough. (Patient not taking: Reported on 01/28/2021) 20 capsule 0   Budeson-Glycopyrrol-Formoterol (BREZTRI AEROSPHERE) 160-9-4.8  MCG/ACT AERO Inhale 2 puffs twice a day with spacer to help prevent cough and wheeze 10.7 g 3   buPROPion (WELLBUTRIN XL) 300 MG 24 hr tablet Take 1 tablet (300 mg total) by mouth daily. 30 tablet 1   Calcium Carb-Cholecalciferol (OYSTER SHELL CALCIUM W/D) 500-5 MG-MCG TABS TAKE 1 TABLET BY MOUTH THREE TIMES A DAY 90 tablet 6   calcium-vitamin D (OSCAL WITH D) 500-200 MG-UNIT tablet Take 1 tablet by mouth 3 (three) times daily. 90 tablet 6   cyclobenzaprine (FLEXERIL) 10 MG tablet Take 10 mg by mouth 3 (three) times daily as needed for muscle spasms.     fluticasone (FLONASE) 50 MCG/ACT nasal spray Place 2 sprays into both nostrils daily. 16 g 6   gabapentin (NEURONTIN) 300 MG capsule Take 1 capsule by mouth 3 (three) times daily.     ipratropium (ATROVENT) 0.03 % nasal spray Place 2 sprays in each nostril twice a day as needed for runny nose/drainage down throat 30 mL 3   meloxicam (MOBIC) 7.5 MG tablet Take 1 tablet (7.5 mg total) by mouth 2 (two) times daily as needed for pain. (Patient not taking: Reported on 01/28/2021) 30 tablet 2   montelukast (SINGULAIR) 10 MG tablet TAKE 1 TABLET BY MOUTH EVERYDAY AT BEDTIME 30 tablet 5   morphine (MSIR) 15 MG tablet Take 15 mg by mouth every 4 (four) hours as needed for severe pain.     Olopatadine HCl 0.2 % SOLN Place 1 drop in each eye once a day as needed for itchy watery eyes 7.5 mL 5   ondansetron (ZOFRAN ODT) 8 MG disintegrating tablet Take 1 tablet (8 mg total) by mouth every 8 (eight) hours as needed for nausea or vomiting. 20 tablet 0   pantoprazole (PROTONIX) 40 MG tablet TAKE 1 TABLET BY MOUTH EVERY DAY 30 tablet 0   predniSONE (DELTASONE) 50 MG tablet Take 1 tablet (50 mg total) by mouth daily with breakfast. (Patient not taking: Reported  on 01/01/2021) 5 tablet 0   simvastatin (ZOCOR) 20 MG tablet TAKE 1 TABLET BY MOUTH EVERY DAY AT 6PM (Patient not taking: Reported on 01/28/2021) 90 tablet 1   trazodone (DESYREL) 300 MG tablet Take 1 tablet  (300 mg total) by mouth at bedtime as needed for sleep. 30 tablet 0   triamcinolone (NASACORT) 55 MCG/ACT AERO nasal inhaler      zonisamide (ZONEGRAN) 100 MG capsule Take 1 capsule (100 mg total) by mouth at bedtime. 30 capsule 1   No current facility-administered medications for this visit.     Musculoskeletal: Strength & Muscle Tone: within normal limits Gait & Station: normal Patient leans: N/A  Psychiatric Specialty Exam: Review of Systems reports chronic back pain,  There were no vitals taken for this visit.There is no height or weight on file to calculate BMI.  General Appearance: Well Groomed, calm, pleasant, makes good eye contact, cooperative , no restlessness or agitation  Eye Contact:  Good  Speech:  Normal Rate- not pressured or fast   Volume:  Normal  Mood:  reports mood " all right" and seems euthymic   Affect:  Appropriate and Full Range  Thought Process:  Linear  Orientation:  Full (Time, Place, and Person)  Thought Content:  denies hallucinations , not internally preoccupied    Suicidal Thoughts:  No denies self injurious or suicidal ideations  Homicidal Thoughts:  No  Memory:   grossly intact   Judgement:  Other:  present   Insight:  Present  Psychomotor Activity:  Normal  Concentration:  Concentration: Good  Recall:  Good  Fund of Knowledge: Good  Language: Good  Akathisia:  Negative  Handed:  Right  AIMS (if indicated): not done  Assets:  Communication Skills Housing Others:  close to her parents, whom she helps care for  ADL's:  Intact  Cognition: WNL  Sleep:   reports stable    Screenings: [place] Office Visit from 04/17/2020 in Primary Care at [facility]  Total Score (max 30 points ) 30      PHQ2-9    [place] Visit from 12/19/2020 in Primary Care at [facility] Visit from 04/17/2020 in Primary Care at [facility] Visit from 06/22/2018 in [place] at [place]  from 05/21/2017 in [place] at [place] from 09/12/2015 in [place] at [facility]  PHQ-2 Total Score 3 0 0 0 0  PHQ-9 Total Score 13 -- -- -- --        Assessment and Plan:   56 year old female, history of Bipolar Disorder , Borderline P. D  per chart , Alcohol Use Disorder in sustained remission, Cannabis Use Disorder  Had been following with Dr Montel Culver for medication management , who has retired .  Reports she has been doing " all right". She states her mood has been stable and denies significant neuro-vegetative symptoms.  Denies anhedonia . No SI. Future oriented   Reports she has been on the same medication combination for more than two years and feels it " works all right for me". Denies side effects. She reports has responded well to Zonisamide . We reviewed medication side effects /potential drug drug interactions . Of note reports has cut down Trazodone to 150 mgr QHS and is using only occasionally We have reviewed potential for sedation   Renew Welllbutrin XL 300 mgr QDAY for depression ( 1 m, 1 refill)  Renew Zonisamide  100 mgr QHS for mood disorder  ( 1 m,  1 refill )  Trazodone 150mg r QHS PRN for insomnia,  reports takes occasionally/does not need script  Side effects reviewed  Will see in about 6 weeks , agrees to contact clinic sooner if any worsening or medication concern prior  Jenne Campus, MD 02/11/2021, 10:07 AM   Patient ID: [name], female   DOB: [birth year], 55 y.o.   MRN: [id number]

## 2021-02-11 NOTE — Telephone Encounter (Signed)
She was switched to [facility] at her last office visit. Please make sure she is not using [place] and Breztri.

## 2021-02-13 ENCOUNTER — Ambulatory Visit (INDEPENDENT_AMBULATORY_CARE_PROVIDER_SITE_OTHER): Payer: Medicare HMO | Admitting: Family Medicine

## 2021-02-13 ENCOUNTER — Ambulatory Visit (INDEPENDENT_AMBULATORY_CARE_PROVIDER_SITE_OTHER): Payer: Medicare HMO

## 2021-02-13 ENCOUNTER — Other Ambulatory Visit: Payer: Self-pay

## 2021-02-13 ENCOUNTER — Encounter: Payer: Self-pay | Admitting: Family Medicine

## 2021-02-13 VITALS — BP 125/82 | HR 95 | Temp 98.0°F | Resp 16 | Wt 148.8 lb

## 2021-02-13 DIAGNOSIS — R059 Cough, unspecified: Secondary | ICD-10-CM | POA: Diagnosis not present

## 2021-02-13 DIAGNOSIS — Z23 Encounter for immunization: Secondary | ICD-10-CM

## 2021-02-13 DIAGNOSIS — R053 Chronic cough: Secondary | ICD-10-CM | POA: Diagnosis not present

## 2021-02-13 NOTE — Progress Notes (Signed)
Patient is c/o still having an active cough. Patient said that she has phlegm coming up that is brown. Patient request CXR

## 2021-02-14 ENCOUNTER — Encounter: Payer: Self-pay | Admitting: Family Medicine

## 2021-02-14 NOTE — Progress Notes (Signed)
Established Patient Office Visit  Subjective:  Patient ID: [name], female    DOB: 1965/06/14  Age: 55 y.o. MRN: [id number]  CC:  Chief Complaint  Patient presents with   Cough    HPI [name] presents for complaint of persistent cough for over 3 weeks. Cough is productive of purulent sputum.  She denies fever/chills. She has had  Past Medical History:  Diagnosis Date   Anxiety    Arthritis    neck, knees, shoulders   Asthma    Bipolar disorder ([place])    currently feeling MANIC- 10/02/2016   Depression    GERD (gastroesophageal reflux disease)    Hip fracture ([place]) 06/2019   History of blood transfusion    as a newborn    History of lump of left breast    Hyperlipidemia    Lumbar pseudoarthrosis    Motion sickness    cars   OSA (obstructive sleep apnea) 01/09/2016   can't afford CPAP   Personality disorder (HCC)    PONV (postoperative nausea and vomiting)    Post traumatic stress disorder (PTSD)    Substance abuse ([place])    Synovial cyst     Past Surgical History:  Procedure Laterality Date   COLONOSCOPY WITH PROPOFOL N/A 11/04/2017   Procedure: COLONOSCOPY WITH PROPOFOL;  Surgeon: Lin Landsman, MD;  Location: [place];  Service: Endoscopy;  Laterality: N/A;   ESOPHAGOGASTRODUODENOSCOPY (EGD) WITH PROPOFOL N/A 11/04/2017   Procedure: ESOPHAGOGASTRODUODENOSCOPY (EGD) WITH PROPOFOL with biopsies;  Surgeon: Lin Landsman, MD;  Location: [place];  Service: Endoscopy;  Laterality: N/A;  sleep apnea   KNEE SURGERY Left    x5, post basketball injury   LUMBAR LAMINECTOMY/DECOMPRESSION MICRODISCECTOMY Left 01/22/2016   Procedure: Laminectomy for facet/synovial cyst - left - Lumbar four - lumbar five;  Surgeon: Earnie Larsson, MD;  Location: [place];  Service: Neurosurgery;  Laterality: Left;  Laminectomy for facet/synovial cyst - left - Lumbar four - lumbar five   POLYPECTOMY N/A 11/04/2017   Procedure: POLYPECTOMY INTESTINAL;  Surgeon:  Lin Landsman, MD;  Location: [place];  Service: Endoscopy;  Laterality: N/A;   SHOULDER SURGERY Left    x2   SPINE SURGERY N/A    Phreesia 08/07/2019   TOE SURGERY Bilateral    bone spurs    Family History  Problem Relation Age of Onset   Heart disease Father    Hyperlipidemia Father    Alcohol abuse Brother    Alcohol abuse Paternal Uncle    Breast cancer Maternal Aunt        70's   Colon cancer Neg Hx     Social History   Socioeconomic History   Marital status: Single    Spouse name: Not on file   Number of children: 0   Years of education: Not on file   Highest education level: Bachelor's degree (e.g., BA, AB, BS)  Occupational History   Not on file  Tobacco Use   Smoking status: Former    Packs/day: 1.00    Years: 1.00    Pack years: 1.00    Types: Cigarettes    Quit date: 04/15/1996    Years since quitting: 24.8   Smokeless tobacco: Never   Tobacco comments:    started back again in 2016, smoked for about 6 mo and then quit  Vaping Use   Vaping Use: Never used  Substance and Sexual Activity   Alcohol use: No  Alcohol/week: 0.0 standard drinks    Comment: quit 20 years   Drug use: Yes    Frequency: 14.0 times per week    Types: Marijuana    Comment: 20 yrs. ago- cocaine    Sexual activity: Not Currently  Other Topics Concern   Not on file  Social History Narrative   Not on file   Social Determinants of Health   Financial Resource Strain: Not on file  Food Insecurity: Not on file  Transportation Needs: Not on file  Physical Activity: Not on file  Stress: Not on file  Social Connections: Not on file  Intimate Partner Violence: Not on file    ROS Review of Systems  Constitutional:  Negative for chills and fever.  Respiratory:  Positive for cough. Negative for shortness of breath and wheezing.   All other systems reviewed and are negative.  Objective:   Today's Vitals: BP 125/82    Pulse 95    Temp 98 F (36.7 C) (Oral)     Resp 16    Wt 148 lb 12.8 oz (67.5 kg)    SpO2 94%    BMI 28.12 kg/m   Physical Exam Vitals and nursing note reviewed.  Constitutional:      General: She is not in acute distress. Cardiovascular:     Rate and Rhythm: Normal rate and regular rhythm.  Pulmonary:     Effort: Pulmonary effort is normal. No respiratory distress.     Breath sounds: Rhonchi present. No wheezing.  Neurological:     General: No focal deficit present.     Mental Status: She is alert and oriented to person, place, and time.    Assessment & Plan:   1. Persistent cough for 3 weeks or longer CXR ordered - results pending. - DG Chest 2 View; Future  2. Need for vaccination  - Pneumococcal conjugate vaccine 20-valent - Varicella-zoster vaccine IM - Flu Vaccine QUAD 101mo+IM (Fluarix, Fluzone & Alfiuria Quad PF)    Outpatient Encounter Medications as of 02/13/2021  Medication Sig   albuterol (VENTOLIN HFA) 108 (90 Base) MCG/ACT inhaler TAKE 2 PUFFS BY MOUTH EVERY 4 TO 6 HOURS AS NEEDED   amoxicillin-clavulanate (AUGMENTIN) 875-125 MG tablet Take 1 tablet by mouth 2 (two) times daily.   aspirin 81 MG tablet Take 81 mg by mouth daily.   azithromycin (ZITHROMAX) 250 MG tablet Take 2 tabs PO day 1, then take 1 tab PO once daily   benzonatate (TESSALON) 100 MG capsule Take 1 capsule (100 mg total) by mouth 2 (two) times daily as needed for cough.   Budeson-Glycopyrrol-Formoterol (BREZTRI AEROSPHERE) 160-9-4.8 MCG/ACT AERO Inhale 2 puffs twice a day with spacer to help prevent cough and wheeze   buPROPion (WELLBUTRIN XL) 300 MG 24 hr tablet Take 1 tablet (300 mg total) by mouth daily.   Calcium Carb-Cholecalciferol (OYSTER SHELL CALCIUM W/D) 500-5 MG-MCG TABS TAKE 1 TABLET BY MOUTH THREE TIMES A DAY   calcium-vitamin D (OSCAL WITH D) 500-200 MG-UNIT tablet Take 1 tablet by mouth 3 (three) times daily.   cyclobenzaprine (FLEXERIL) 10 MG tablet Take 10 mg by mouth 3 (three) times daily as needed for muscle spasms.    fluticasone (FLONASE) 50 MCG/ACT nasal spray Place 2 sprays into both nostrils daily.   gabapentin (NEURONTIN) 300 MG capsule Take 1 capsule by mouth 3 (three) times daily.   ipratropium (ATROVENT) 0.03 % nasal spray Place 2 sprays in each nostril twice a day as needed for runny nose/drainage down throat  meloxicam (MOBIC) 7.5 MG tablet Take 1 tablet (7.5 mg total) by mouth 2 (two) times daily as needed for pain.   montelukast (SINGULAIR) 10 MG tablet TAKE 1 TABLET BY MOUTH EVERYDAY AT BEDTIME   morphine (MSIR) 15 MG tablet Take 15 mg by mouth every 4 (four) hours as needed for severe pain.   Olopatadine HCl 0.2 % SOLN Place 1 drop in each eye once a day as needed for itchy watery eyes   ondansetron (ZOFRAN ODT) 8 MG disintegrating tablet Take 1 tablet (8 mg total) by mouth every 8 (eight) hours as needed for nausea or vomiting.   pantoprazole (PROTONIX) 40 MG tablet TAKE 1 TABLET BY MOUTH EVERY DAY   predniSONE (DELTASONE) 50 MG tablet Take 1 tablet (50 mg total) by mouth daily with breakfast.   simvastatin (ZOCOR) 20 MG tablet TAKE 1 TABLET BY MOUTH EVERY DAY AT 6PM   triamcinolone (NASACORT) 55 MCG/ACT AERO nasal inhaler    zonisamide (ZONEGRAN) 100 MG capsule Take 1 capsule (100 mg total) by mouth at bedtime.   No facility-administered encounter medications on file as of 02/13/2021.    Follow-up: No follow-ups on file.   Becky Sax, MD

## 2021-02-15 ENCOUNTER — Other Ambulatory Visit: Payer: Self-pay | Admitting: Family Medicine

## 2021-02-15 DIAGNOSIS — R052 Subacute cough: Secondary | ICD-10-CM

## 2021-02-15 MED ORDER — DOXYCYCLINE HYCLATE 100 MG PO TABS: 100.0000 mg | ORAL_TABLET | Freq: Two times a day (BID) | ORAL | 0 refills | Status: DC

## 2021-02-16 ENCOUNTER — Other Ambulatory Visit: Payer: Self-pay | Admitting: Physician Assistant

## 2021-02-16 DIAGNOSIS — R112 Nausea with vomiting, unspecified: Secondary | ICD-10-CM

## 2021-02-16 DIAGNOSIS — K449 Diaphragmatic hernia without obstruction or gangrene: Secondary | ICD-10-CM

## 2021-02-16 DIAGNOSIS — K219 Gastro-esophageal reflux disease without esophagitis: Secondary | ICD-10-CM

## 2021-02-18 NOTE — Progress Notes (Signed)
Patient was called and LVM for patient to return call for CXR results

## 2021-02-18 NOTE — Progress Notes (Signed)
Patient called and LVM to call office back for CXR results

## 2021-02-22 ENCOUNTER — Other Ambulatory Visit: Payer: Self-pay | Admitting: Physician Assistant

## 2021-02-22 DIAGNOSIS — K219 Gastro-esophageal reflux disease without esophagitis: Secondary | ICD-10-CM

## 2021-02-22 DIAGNOSIS — K449 Diaphragmatic hernia without obstruction or gangrene: Secondary | ICD-10-CM

## 2021-02-22 DIAGNOSIS — R112 Nausea with vomiting, unspecified: Secondary | ICD-10-CM

## 2021-02-28 ENCOUNTER — Ambulatory Visit: Payer: Medicare HMO | Admitting: Physician Assistant

## 2021-02-28 ENCOUNTER — Other Ambulatory Visit: Payer: Self-pay

## 2021-02-28 VITALS — BP 135/86 | HR 88 | Temp 98.2°F | Resp 18 | Ht 61.0 in | Wt 147.0 lb

## 2021-02-28 DIAGNOSIS — J4541 Moderate persistent asthma with (acute) exacerbation: Secondary | ICD-10-CM | POA: Diagnosis not present

## 2021-02-28 MED ORDER — PREDNISONE 20 MG PO TABS: 40.0000 mg | ORAL_TABLET | Freq: Every day | ORAL | 0 refills | Status: AC

## 2021-02-28 NOTE — Progress Notes (Signed)
Established Patient Office Visit  Subjective:  Patient ID: [name], female    DOB: 09-Mar-1965  Age: 55 y.o. MRN: [id number]  CC:  Chief Complaint  Patient presents with   Follow-up    HPI.  [name] states that she was seen by her PCP on 02/13/21 due to having a persistent cough that was productive of purulent sputum for the previous 3 weeks.  States that she had a chest x-ray completed:    IMPRESSION: Mild, diffuse bilateral interstitial pulmonary opacity, similar to prior examination and may reflect edema or infection. No focal airspace opacity.     Electronically Signed   By: Delanna Ahmadi M.D.   On: 02/15/2021 07:04  And then was started on doxycycline by her PCP.  States today that she did complete the course of doxycycline and has had some improvement but continues to have the same cough with purulent sputum.  Reports that she has been having wheezing as well.  States that she has an appointment with her asthma specialist on March 06, 2021.  Past Medical History:  Diagnosis Date   Anxiety    Arthritis    neck, knees, shoulders   Asthma    Bipolar disorder ([place])    currently feeling MANIC- 10/02/2016   Depression    GERD (gastroesophageal reflux disease)    Hip fracture ([place]) 06/2019   History of blood transfusion    as a newborn    History of lump of left breast    Hyperlipidemia    Lumbar pseudoarthrosis    Motion sickness    cars   OSA (obstructive sleep apnea) 01/09/2016   can't afford CPAP   Personality disorder (H[facility])    PONV (postoperative nausea and vomiting)    Post traumatic stress disorder (PTSD)    Substance abuse ([place])    Synovial cyst     Past Surgical History:  Procedure Laterality Date   COLONOSCOPY WITH PROPOFOL N/A 11/04/2017   Procedure: COLONOSCOPY WITH PROPOFOL;  Surgeon: Lin Landsman, MD;  Location: [place];  Service: Endoscopy;  Laterality: N/A;   ESOPHAGOGASTRODUODENOSCOPY (EGD) WITH PROPOFOL N/A  11/04/2017   Procedure: ESOPHAGOGASTRODUODENOSCOPY (EGD) WITH PROPOFOL with biopsies;  Surgeon: Lin Landsman, MD;  Location: [place];  Service: Endoscopy;  Laterality: N/A;  sleep apnea   KNEE SURGERY Left    x5, post basketball injury   LUMBAR LAMINECTOMY/DECOMPRESSION MICRODISCECTOMY Left 01/22/2016   Procedure: Laminectomy for facet/synovial cyst - left - Lumbar four - lumbar five;  Surgeon: Earnie Larsson, MD;  Location: [place];  Service: Neurosurgery;  Laterality: Left;  Laminectomy for facet/synovial cyst - left - Lumbar four - lumbar five   POLYPECTOMY N/A 11/04/2017   Procedure: POLYPECTOMY INTESTINAL;  Surgeon: Lin Landsman, MD;  Location: [place];  Service: Endoscopy;  Laterality: N/A;   SHOULDER SURGERY Left    x2   SPINE SURGERY N/A    [employer] 08/07/2019   TOE SURGERY Bilateral    bone spurs    Family History  Problem Relation Age of Onset   Heart disease Father    Hyperlipidemia Father    Alcohol abuse Brother    Alcohol abuse Paternal Uncle    Breast cancer Maternal Aunt        70's   Colon cancer Neg Hx     Social History   Socioeconomic History   Marital status: Single    Spouse name: Not on file   Number of children:  0   Years of education: Not on file   Highest education level: Bachelor's degree (e.g., BA, AB, BS)  Occupational History   Not on file  Tobacco Use   Smoking status: Former    Packs/day: 1.00    Years: 1.00    Pack years: 1.00    Types: Cigarettes    Quit date: 04/15/1996    Years since quitting: 24.8   Smokeless tobacco: Never   Tobacco comments:    started back again in 2016, smoked for about 6 mo and then quit  Vaping Use   Vaping Use: Never used  Substance and Sexual Activity   Alcohol use: No    Alcohol/week: 0.0 standard drinks    Comment: quit 20 years   Drug use: Yes    Frequency: 14.0 times per week    Types: Marijuana    Comment: 20 yrs. ago- cocaine    Sexual activity: Not Currently   Other Topics Concern   Not on file  Social History Narrative   Not on file   Social Determinants of Health   Financial Resource Strain: Not on file  Food Insecurity: Not on file  Transportation Needs: Not on file  Physical Activity: Not on file  Stress: Not on file  Social Connections: Not on file  Intimate Partner Violence: Not on file    Outpatient Medications Prior to Visit  Medication Sig Dispense Refill   albuterol (VENTOLIN HFA) 108 (90 Base) MCG/ACT inhaler TAKE 2 PUFFS BY MOUTH EVERY 4 TO 6 HOURS AS NEEDED 18 each 1   aspirin 81 MG tablet Take 81 mg by mouth daily.     Budeson-Glycopyrrol-Formoterol (BREZTRI AEROSPHERE) 160-9-4.8 MCG/ACT AERO Inhale 2 puffs twice a day with spacer to help prevent cough and wheeze 10.7 g 3   buPROPion (WELLBUTRIN XL) 300 MG 24 hr tablet Take 1 tablet (300 mg total) by mouth daily. 30 tablet 1   Calcium Carb-Cholecalciferol (OYSTER SHELL CALCIUM W/D) 500-5 MG-MCG TABS TAKE 1 TABLET BY MOUTH THREE TIMES A DAY 90 tablet 6   calcium-vitamin D (OSCAL WITH D) 500-200 MG-UNIT tablet Take 1 tablet by mouth 3 (three) times daily. 90 tablet 6   cyclobenzaprine (FLEXERIL) 10 MG tablet Take 10 mg by mouth 3 (three) times daily as needed for muscle spasms.     fluticasone (FLONASE) 50 MCG/ACT nasal spray Place 2 sprays into both nostrils daily. 16 g 6   gabapentin (NEURONTIN) 300 MG capsule Take 1 capsule by mouth 3 (three) times daily.     ipratropium (ATROVENT) 0.03 % nasal spray Place 2 sprays in each nostril twice a day as needed for runny nose/drainage down throat 30 mL 3   meloxicam (MOBIC) 7.5 MG tablet Take 1 tablet (7.5 mg total) by mouth 2 (two) times daily as needed for pain. 30 tablet 2   montelukast (SINGULAIR) 10 MG tablet TAKE 1 TABLET BY MOUTH EVERYDAY AT BEDTIME 30 tablet 5   morphine (MSIR) 15 MG tablet Take 15 mg by mouth every 4 (four) hours as needed for severe pain.     Olopatadine HCl 0.2 % SOLN Place 1 drop in each eye once a day as  needed for itchy watery eyes 7.5 mL 5   pantoprazole (PROTONIX) 40 MG tablet TAKE 1 TABLET BY MOUTH EVERY DAY 30 tablet 0   simvastatin (ZOCOR) 20 MG tablet TAKE 1 TABLET BY MOUTH EVERY DAY AT 6PM 90 tablet 1   triamcinolone (NASACORT) 55 MCG/ACT AERO nasal inhaler  zonisamide (ZONEGRAN) 100 MG capsule Take 1 capsule (100 mg total) by mouth at bedtime. 30 capsule 1   benzonatate (TESSALON) 100 MG capsule Take 1 capsule (100 mg total) by mouth 2 (two) times daily as needed for cough. 20 capsule 0   doxycycline (VIBRA-TABS) 100 MG tablet Take 1 tablet (100 mg total) by mouth 2 (two) times daily. 14 tablet 0   ondansetron (ZOFRAN ODT) 8 MG disintegrating tablet Take 1 tablet (8 mg total) by mouth every 8 (eight) hours as needed for nausea or vomiting. 20 tablet 0   predniSONE (DELTASONE) 50 MG tablet Take 1 tablet (50 mg total) by mouth daily with breakfast. 5 tablet 0   No facility-administered medications prior to visit.    Allergies  Allergen Reactions   Other    Effexor [Venlafaxine] Other (See Comments)    UNSPECIFIED REACTION, headaches, felt funny, withdrawal with missed dose    Lamictal [Lamotrigine] Rash    ROS Review of Systems  Constitutional:  Negative for chills and fever.  HENT:  Positive for congestion and sore throat. Negative for ear pain, postnasal drip, sinus pressure, sinus pain and trouble swallowing.   Eyes: Negative.   Respiratory:  Positive for cough and wheezing. Negative for shortness of breath.   Cardiovascular:  Negative for chest pain.  Gastrointestinal: Negative.   Endocrine: Negative.   Genitourinary: Negative.   Musculoskeletal: Negative.   Skin: Negative.   Allergic/Immunologic: Negative.   Neurological: Negative.   Hematological: Negative.   Psychiatric/Behavioral: Negative.       Objective:    Physical Exam Vitals and nursing note reviewed.  Constitutional:      General: She is not in acute distress.    Appearance: Normal appearance.  She is not ill-appearing.  HENT:     Head: Normocephalic and atraumatic.     Right Ear: External ear normal.     Left Ear: External ear normal.     Nose: Nose normal.     Mouth/Throat:     Mouth: Mucous membranes are moist.     Pharynx: Oropharynx is clear.  Eyes:     Extraocular Movements: Extraocular movements intact.     Conjunctiva/sclera: Conjunctivae normal.     Pupils: Pupils are equal, round, and reactive to light.  Cardiovascular:     Rate and Rhythm: Normal rate and regular rhythm.     Pulses: Normal pulses.     Heart sounds: Normal heart sounds.  Pulmonary:     Effort: Pulmonary effort is normal.     Breath sounds: Normal breath sounds. No stridor. No wheezing or rhonchi.  Musculoskeletal:        General: Normal range of motion.     Cervical back: Normal range of motion and neck supple.  Skin:    General: Skin is warm and dry.  Neurological:     General: No focal deficit present.     Mental Status: She is alert and oriented to person, place, and time.  Psychiatric:        Mood and Affect: Mood normal.        Behavior: Behavior normal.        Thought Content: Thought content normal.        Judgment: Judgment normal.    BP 135/86 (BP Location: Left Arm, Patient Position: Sitting, Cuff Size: Normal)    Pulse 88    Temp 98.2 F (36.8 C) (Oral)    Resp 18    Ht _0  (1.549 m)  Wt 147 lb (66.7 kg)    SpO2 96%    BMI 27.78 kg/m  Wt Readings from Last 3 Encounters:  02/28/21 147 lb (66.7 kg)  02/13/21 148 lb 12.8 oz (67.5 kg)  01/28/21 153 lb (69.4 kg)     Health Maintenance Due  Topic Date Due   COVID-19 Vaccine (3 - Mixed Product risk series) 10/13/2019    There are no preventive care reminders to display for this patient.  Lab Results  Component Value Date   TSH 1.310 04/05/2020   Lab Results  Component Value Date   WBC 7.3 01/10/2021   HGB 13.1 01/10/2021   HCT 38.4 01/10/2021   MCV 90 01/10/2021   PLT 444 04/05/2020   Lab Results  Component  Value Date   NA 139 01/10/2021   K 4.7 01/10/2021   CO2 24 01/10/2021   GLUCOSE 96 01/10/2021   BUN 7 01/10/2021   CREATININE 0.88 01/10/2021   BILITOT 0.2 04/05/2020   ALKPHOS 81 04/05/2020   AST 15 04/05/2020   ALT 23 05/12/2019   PROT 7.0 04/05/2020   ALBUMIN 4.4 04/05/2020   CALCIUM 9.5 01/10/2021   ANIONGAP 9 10/28/2016   EGFR 77 01/10/2021   GFR 77.07 07/07/2018   Lab Results  Component Value Date   CHOL 168 08/10/2019   Lab Results  Component Value Date   HDL 71 08/10/2019   Lab Results  Component Value Date   LDLCALC 79 08/10/2019   Lab Results  Component Value Date   TRIG 103 08/10/2019   Lab Results  Component Value Date   CHOLHDL 2.4 08/10/2019   Lab Results  Component Value Date   HGBA1C 5.6 06/22/2018      Assessment & Plan:   Problem List Items Addressed This Visit       Respiratory   Asthma exacerbation - Primary   Relevant Medications   predniSONE (DELTASONE) 20 MG tablet    Meds ordered this encounter  Medications   predniSONE (DELTASONE) 20 MG tablet    Sig: Take 2 tablets (40 mg total) by mouth daily with breakfast for 5 days.    Dispense:  10 tablet    Refill:  0    Order Specific Question:   Supervising Provider    Answer:   Joya Gaskins, PATRICK E [[id number]]   1. Moderate persistent asthma with exacerbation Trial prednisone.  Continue follow-up with asthma specialist.  Patient education given on supportive care, red flags for prompt reevaluation. - predniSONE (DELTASONE) 20 MG tablet; Take 2 tablets (40 mg total) by mouth daily with breakfast for 5 days.  Dispense: 10 tablet; Refill: 0   I have reviewed the patient's medical history (PMH, PSH, Social History, Family History, Medications, and allergies) , and have been updated if relevant. I spent 20 minutes reviewing chart and  face to face time with patient.    Follow-up: Return if symptoms worsen or fail to improve.    Loraine Grip Mayers, PA-C

## 2021-02-28 NOTE — Patient Instructions (Signed)
You are going to take prednisone 40 mg for 5 days.  Continue getting plenty of rest and drinking lots of fluids.  I encourage you to keep your follow-up appointment with your asthma specialist.  Please let us know if there is anything else we can do for you.  Kennieth Rad, PA-C Physician Assistant [facility] [email or web address]   Cough, Adult Coughing is a reflex that clears your throat and your airways (respiratory system). Coughing helps to heal and protect your lungs. It is normal to cough occasionally, but a cough that happens with other symptoms or lasts a long time may be a sign of a condition that needs treatment. An acute cough may only last 2-3 weeks, while a chronic cough may last 8 or more weeks. Coughing is commonly caused by: Infection of the respiratory systemby viruses or bacteria. Breathing in substances that irritate your lungs. Allergies. Asthma. Mucus that runs down the back of your throat (postnasal drip). Smoking. Acid backing up from the stomach into the esophagus (gastroesophageal reflux). Certain medicines. Chronic lung problems. Other medical conditions such as heart failure or a blood clot in the lung (pulmonary embolism). Follow these instructions at home: Medicines Take over-the-counter and prescription medicines only as told by your health care provider. Talk with your health care provider before you take a cough suppressant medicine. Lifestyle  Avoid cigarette smoke. Do not use any products that contain nicotine or tobacco, such as cigarettes, e-cigarettes, and chewing tobacco. If you need help quitting, ask your health care provider. Drink enough fluid to keep your urine pale yellow. Avoid caffeine. Do not drink alcohol if your health care provider tells you not to drink. General instructions  Pay close attention to changes in your cough. Tell your health care provider about them. Always  cover your mouth when you cough. Avoid things that make you cough, such as perfume, candles, cleaning products, or campfire or tobacco smoke. If the air is dry, use a cool mist vaporizer or humidifier in your bedroom or your home to help loosen secretions. If your cough is worse at night, try to sleep in a semi-upright position. Rest as needed. Keep all follow-up visits as told by your health care provider. This is important. Contact a health care provider if you: Have new symptoms. Cough up pus. Have a cough that does not get better after 2-3 weeks or gets worse. Cannot control your cough with cough suppressant medicines and you are losing sleep. Have pain that gets worse or pain that is not helped with medicine. Have a fever. Have unexplained weight loss. Have night sweats. Get help right away if: You cough up blood. You have difficulty breathing. Your heartbeat is very fast. These symptoms may represent a serious problem that is an emergency. Do not wait to see if the symptoms will go away. Get medical help right away. Call your local emergency services (911 in the [place] Do not drive yourself to the hospital. Summary Coughing is a reflex that clears your throat and your airways. It is normal to cough occasionally, but a cough that happens with other symptoms or lasts a long time may be a sign of a condition that needs treatment. Take over-the-counter and prescription medicines only as told by your health care provider. Always cover your mouth when you cough. Contact a health care provider if you have new symptoms or a cough that does not get better after 2-3 weeks or gets worse. This information is not intended to  replace advice given to you by your health care provider. Make sure you discuss any questions you have with your health care provider. Document Revised: 03/08/2018 Document Reviewed: 03/08/2018 Elsevier Patient Education  [place].

## 2021-02-28 NOTE — Telephone Encounter (Signed)
Please refill if appropriate

## 2021-02-28 NOTE — Progress Notes (Signed)
Patient has eaten and taken medication today. Patient complains of chronic back pain. Patient request refill on antibiotics, zofran, prilosec.

## 2021-03-05 NOTE — Telephone Encounter (Signed)
Apologies, medication request were sent because you are the covering provider when wilson is out.

## 2021-03-06 ENCOUNTER — Ambulatory Visit: Payer: Medicare HMO | Admitting: Family

## 2021-03-08 LAB — OTHER LAB TEST

## 2021-03-12 ENCOUNTER — Encounter (HOSPITAL_COMMUNITY): Payer: Self-pay | Admitting: Psychiatry

## 2021-03-12 ENCOUNTER — Other Ambulatory Visit: Payer: Self-pay

## 2021-03-12 ENCOUNTER — Telehealth (HOSPITAL_BASED_OUTPATIENT_CLINIC_OR_DEPARTMENT_OTHER): Payer: Medicare HMO | Admitting: Psychiatry

## 2021-03-12 ENCOUNTER — Telehealth (HOSPITAL_COMMUNITY): Payer: Medicare HMO | Admitting: Psychiatry

## 2021-03-12 DIAGNOSIS — F3175 Bipolar disorder, in partial remission, most recent episode depressed: Secondary | ICD-10-CM

## 2021-03-12 DIAGNOSIS — F324 Major depressive disorder, single episode, in partial remission: Secondary | ICD-10-CM

## 2021-03-12 MED ORDER — ZONISAMIDE 100 MG PO CAPS: 100.0000 mg | ORAL_CAPSULE | Freq: Every day | ORAL | 1 refills | Status: DC

## 2021-03-12 MED ORDER — BUPROPION HCL ER (XL) 300 MG PO TB24: 300.0000 mg | ORAL_TABLET | Freq: Every day | ORAL | 1 refills | Status: DC

## 2021-03-12 NOTE — Progress Notes (Signed)
[place] MD/PA/NP OP Progress Note  03/12/2021 1:10 PM [name]  MRN:  [id number]  Chief Complaint:   medication management  This was a phone based visit . Limitations associated with this type of communication have been reviewed and patient's identity has been confirmed with two separate identifiers . Location of parties  [facility]  Duration 20 minutes  HPI: [name] is a 56 year old female . She was seeing Dr.Pucilowski for outpatient psychiatric management . He has retired ( last seen by Dr Mamie Nick. On 03/15/2020 ) .  56 year old single female, lives alone, on disability.    She reports she has been doing well overall . She has had cold/flu like symptoms ( cough, congestion)over recent days, but states she is getting better. She states that " things have been OK" recently and does not identify and recent stressors or new concerns . She has a history of chronic back pain for which she is prescribed opiate analgesic ( Morphine) by her pain specialist . States she continues to have significant lower back but also neck pain and thinks she will need another surgery in the future to address . Denies opiate abuse or misuse , states she normally takes Morphine BID. Denies excessive sedation or other side effects from this medication . She reports that her mood has been stable and denies significant depression or neuro-vegetative symptoms. Denies SI. She also does not endorse having any hypomanic or manic type symptoms. Presents euthymic, and affect appears reactive . She denies suicidal ideations and no psychotic symptoms are noted or endorsed. She has a remote history of alcohol use disorder, now abstinent for many years. Denies alcohol cravings or thoughts . With regards to medications, states she has been taking regularly and denies side effects. As noted, reports she has been taking Zonegran for 2 or more years and states she feels this medication has been more  helpful in keeping her mood stable than other prior psychiatric medication trials and that she has remained stable on it . We have reviewed it being  " off label " for management of mood disorder . We have also reviewed potentially serious side effects to include metabolic acidosis risk and severe rash, SJ Syndrome risk. We have reviewed caution of avoiding abrupt discontinuation ( note patient denies history of seizures ) . She is also on Wellbutrin , which she has tolerated well without side effects. She states she rarely takes Trazodone and has not recently taken this medication.   Labs reviewed - 03/22 EKG QTc 383 11/10 CBC unremarkable 11/10 BUN 7, Creat 0.88, CO224          Visit Diagnosis: Bipolar Disorder II by history   Past Psychiatric History: Reports history of Bipolar Disorder diagnosis. Chart notes also indicate history of Borderline Personality Disorder .  Past history of Alcohol Use Disorder , reports has been sober /abstinent for several years .   Past Medical History:  Past Medical History:  Diagnosis Date   Anxiety    Arthritis    neck, knees, shoulders   Asthma    Bipolar disorder ([place])    currently feeling MANIC- 10/02/2016   Depression    GERD (gastroesophageal reflux disease)    Hip fracture ([place]) 06/2019   History of blood transfusion    as a newborn    History of lump of left breast    Hyperlipidemia    Lumbar pseudoarthrosis    Motion sickness  cars   OSA (obstructive sleep apnea) 01/09/2016   can't afford CPAP   Personality disorder (HCC)    PONV (postoperative nausea and vomiting)    Post traumatic stress disorder (PTSD)    Substance abuse ([place])    Synovial cyst     Past Surgical History:  Procedure Laterality Date   COLONOSCOPY WITH PROPOFOL N/A 11/04/2017   Procedure: COLONOSCOPY WITH PROPOFOL;  Surgeon: Lin Landsman, MD;  Location: [place];  Service: Endoscopy;  Laterality: N/A;   ESOPHAGOGASTRODUODENOSCOPY (EGD) WITH  PROPOFOL N/A 11/04/2017   Procedure: ESOPHAGOGASTRODUODENOSCOPY (EGD) WITH PROPOFOL with biopsies;  Surgeon: Lin Landsman, MD;  Location: [place];  Service: Endoscopy;  Laterality: N/A;  sleep apnea   KNEE SURGERY Left    x5, post basketball injury   LUMBAR LAMINECTOMY/DECOMPRESSION MICRODISCECTOMY Left 01/22/2016   Procedure: Laminectomy for facet/synovial cyst - left - Lumbar four - lumbar five;  Surgeon: Earnie Larsson, MD;  Location: [facility];  Service: Neurosurgery;  Laterality: Left;  Laminectomy for facet/synovial cyst - left - Lumbar four - lumbar five   POLYPECTOMY N/A 11/04/2017   Procedure: POLYPECTOMY INTESTINAL;  Surgeon: Lin Landsman, MD;  Location: [facility];  Service: Endoscopy;  Laterality: N/A;   SHOULDER SURGERY Left    x2   SPINE SURGERY N/A    Phreesia 08/07/2019   TOE SURGERY Bilateral    bone spurs    Family Psychiatric History: As below   Family History:  Family History  Problem Relation Age of Onset   Heart disease Father    Hyperlipidemia Father    Alcohol abuse Brother    Alcohol abuse Paternal Uncle    Breast cancer Maternal Aunt        70's   Colon cancer Neg Hx     Social History: Single,no children,  lives alone, on disability, focuses on taking care of her elderly parents  Social History   Socioeconomic History   Marital status: Single    Spouse name: Not on file   Number of children: 0   Years of education: Not on file   Highest education level: Bachelor's degree (e.g., BA, AB, BS)  Occupational History   Not on file  Tobacco Use   Smoking status: Former    Packs/day: 1.00    Years: 1.00    Pack years: 1.00    Types: Cigarettes    Quit date: 04/15/1996    Years since quitting: 24.9   Smokeless tobacco: Never   Tobacco comments:    started back again in 2016, smoked for about 6 mo and then quit  Vaping Use   Vaping Use: Never used  Substance and Sexual Activity   Alcohol use: No    Alcohol/week: 0.0  standard drinks    Comment: quit 20 years   Drug use: Yes    Frequency: 14.0 times per week    Types: Marijuana    Comment: 20 yrs. ago- cocaine    Sexual activity: Not Currently  Other Topics Concern   Not on file  Social History Narrative   Not on file   Social Determinants of Health   Financial Resource Strain: Not on file  Food Insecurity: Not on file  Transportation Needs: Not on file  Physical Activity: Not on file  Stress: Not on file  Social Connections: Not on file    Allergies:  Allergies  Allergen Reactions   Other    Effexor [Venlafaxine] Other (See Comments)    UNSPECIFIED  REACTION, headaches, felt funny, withdrawal with missed dose    Lamictal [Lamotrigine] Rash    Metabolic Disorder Labs: Lab Results  Component Value Date   HGBA1C 5.6 06/22/2018   No results found for: PROLACTIN Lab Results  Component Value Date   CHOL 168 08/10/2019   TRIG 103 08/10/2019   HDL 71 08/10/2019   CHOLHDL 2.4 08/10/2019   VLDL 22.2 06/22/2018   LDLCALC 79 08/10/2019   LDLCALC 79 06/22/2018   Lab Results  Component Value Date   TSH 1.310 04/05/2020   TSH 1.30 03/15/2015    Therapeutic Level Labs: No results found for: LITHIUM No results found for: VALPROATE No components found for:  CBMZ  Current Medications: Current Outpatient Medications  Medication Sig Dispense Refill   albuterol (VENTOLIN HFA) 108 (90 Base) MCG/ACT inhaler TAKE 2 PUFFS BY MOUTH EVERY 4 TO 6 HOURS AS NEEDED 18 each 1   aspirin 81 MG tablet Take 81 mg by mouth daily.     Budeson-Glycopyrrol-Formoterol (BREZTRI AEROSPHERE) 160-9-4.8 MCG/ACT AERO Inhale 2 puffs twice a day with spacer to help prevent cough and wheeze 10.7 g 3   buPROPion (WELLBUTRIN XL) 300 MG 24 hr tablet Take 1 tablet (300 mg total) by mouth daily. 30 tablet 1   Calcium Carb-Cholecalciferol (OYSTER SHELL CALCIUM W/D) 500-5 MG-MCG TABS TAKE 1 TABLET BY MOUTH THREE TIMES A DAY 90 tablet 6   calcium-vitamin D (OSCAL WITH D)  500-200 MG-UNIT tablet Take 1 tablet by mouth 3 (three) times daily. 90 tablet 6   cyclobenzaprine (FLEXERIL) 10 MG tablet Take 10 mg by mouth 3 (three) times daily as needed for muscle spasms.     fluticasone (FLONASE) 50 MCG/ACT nasal spray Place 2 sprays into both nostrils daily. 16 g 6   gabapentin (NEURONTIN) 300 MG capsule Take 1 capsule by mouth 3 (three) times daily.     ipratropium (ATROVENT) 0.03 % nasal spray Place 2 sprays in each nostril twice a day as needed for runny nose/drainage down throat 30 mL 3   meloxicam (MOBIC) 7.5 MG tablet Take 1 tablet (7.5 mg total) by mouth 2 (two) times daily as needed for pain. 30 tablet 2   montelukast (SINGULAIR) 10 MG tablet TAKE 1 TABLET BY MOUTH EVERYDAY AT BEDTIME 30 tablet 5   morphine (MSIR) 15 MG tablet Take 15 mg by mouth every 4 (four) hours as needed for severe pain.     Olopatadine HCl 0.2 % SOLN Place 1 drop in each eye once a day as needed for itchy watery eyes 7.5 mL 5   pantoprazole (PROTONIX) 40 MG tablet TAKE 1 TABLET BY MOUTH EVERY DAY 30 tablet 0   simvastatin (ZOCOR) 20 MG tablet TAKE 1 TABLET BY MOUTH EVERY DAY AT 6PM 90 tablet 1   triamcinolone (NASACORT) 55 MCG/ACT AERO nasal inhaler      zonisamide (ZONEGRAN) 100 MG capsule Take 1 capsule (100 mg total) by mouth at bedtime. 30 capsule 1   No current facility-administered medications for this visit.     Musculoskeletal: Strength & Muscle Tone: within normal limits Gait & Station: normal Patient leans: N/A  Psychiatric Specialty Exam: note limitations in obtaining a MSE in the context of phone communication Review of Systems reports chronic back pain,  There were no vitals taken for this visit.There is no height or weight on file to calculate BMI.  General Appearance: NA  Eye Contact:  NA  Speech:  Normal Rate  Volume:  Normal  Mood:  she  reports mood as "OK", does not endorse depression  Affect:  presents reactive   Thought Process:  Linear/ intact    Orientation:  Full (Time, Place, and Person)  Thought Content: denies hallucinations , no delusions expressed, denies suicidal or self injurious thoughts  Suicidal Thoughts:  No denies self injurious or suicidal ideations  Homicidal Thoughts:  No  Memory:   grossly intact   Judgement:  Other:  present   Insight:  Present  Psychomotor Activity:  NA  Concentration:  Concentration: Good  Recall:  Good  Fund of Knowledge: Good  Language: Good  Akathisia:  Negative  Handed:  Right  AIMS (if indicated): not done  Assets:  Communication Skills Housing Others:  close to her parents, whom she helps care for  ADL's:  Intact  Cognition: WNL  Sleep:   reports stable    Screenings: [place] Office Visit from 04/17/2020 in Primary Care at [facility]  Total Score (max 30 points ) 30      PHQ2-9    [place] Visit from 02/13/2021 in Primary Care at [facility] Visit from 12/19/2020 in [place] at [facility] Visit from 04/17/2020 in Primary Care at [place] from 06/22/2018 in [place] at [place] from 05/21/2017 in [place] at [facility]  PHQ-2 Total Score 2 3 0 0 0  PHQ-9 Total Score 12 13 -- -- --        Assessment and Plan:  56year old female, history of Bipolar Disorder , Borderline P. D  per chart , Alcohol Use Disorder in sustained remission, Cannabis Use Disorder  Had been following with Dr Montel Culver for medication management , who has retired .  Patient reports that in general has been doing "OK", and reports her mood has been stable . No SI, presents future oriented . She has a remote history of AUD, states she remains abstinent Benard Halsted . She has been prescribed Zonisamide for 2 years or so as per her report and states this medication has been well tolerated and more effective in treating her mood than other psychiatric medications she had tried in the past . Her  preference is to continue current medication regimen. Denies side effects, which we reviewed, including risk of metabolic acidosis and of Remo Lipps Johnson's Syndrome . We reviewed off label use for mood disorder.   Renew Welllbutrin XL 300 mgr QDAY for depression ( 1 m, 1 refill)  Renew Zonisamide 100 mgr QHS for mood disorder  ( 1 m,  1 refill )  D/ C Trazodone , states was taking rarely  Side effects reviewed  Will see in about 6 weeks , agrees to contact clinic sooner if any worsening or medication concern prior Have insured that she has number for front desk staff and also for crisis hotline if needed  Jenne Campus, MD 03/12/2021, 1:10 PM   Patient ID: [name], female   DOB: 26-Oct-1965, 56 y.o.   MRN: [id number]

## 2021-03-15 ENCOUNTER — Encounter: Payer: Self-pay | Admitting: Allergy

## 2021-03-15 ENCOUNTER — Ambulatory Visit: Payer: Medicare HMO | Admitting: Allergy

## 2021-03-15 ENCOUNTER — Other Ambulatory Visit: Payer: Self-pay

## 2021-03-15 ENCOUNTER — Telehealth: Payer: Self-pay

## 2021-03-15 VITALS — BP 124/78 | HR 81 | Temp 97.9°F | Resp 16 | Ht 60.98 in | Wt 154.0 lb

## 2021-03-15 DIAGNOSIS — R760 Raised antibody titer: Secondary | ICD-10-CM | POA: Diagnosis not present

## 2021-03-15 DIAGNOSIS — K219 Gastro-esophageal reflux disease without esophagitis: Secondary | ICD-10-CM | POA: Diagnosis not present

## 2021-03-15 DIAGNOSIS — J4540 Moderate persistent asthma, uncomplicated: Secondary | ICD-10-CM | POA: Diagnosis not present

## 2021-03-15 DIAGNOSIS — J3089 Other allergic rhinitis: Secondary | ICD-10-CM

## 2021-03-15 DIAGNOSIS — H1013 Acute atopic conjunctivitis, bilateral: Secondary | ICD-10-CM | POA: Diagnosis not present

## 2021-03-15 MED ORDER — MONTELUKAST SODIUM 10 MG PO TABS: ORAL_TABLET | ORAL | 5 refills | Status: DC

## 2021-03-15 MED ORDER — TRELEGY ELLIPTA 200-62.5-25 MCG/ACT IN AEPB: 1.0000 | INHALATION_SPRAY | Freq: Every day | RESPIRATORY_TRACT | 5 refills | Status: DC

## 2021-03-15 MED ORDER — IPRATROPIUM BROMIDE 0.03 % NA SOLN: NASAL | 5 refills | Status: DC

## 2021-03-15 MED ORDER — PANTOPRAZOLE SODIUM 40 MG PO TBEC: 40.0000 mg | DELAYED_RELEASE_TABLET | Freq: Every day | ORAL | 5 refills | Status: DC

## 2021-03-15 MED ORDER — LEVOCETIRIZINE DIHYDROCHLORIDE 5 MG PO TABS: 5.0000 mg | ORAL_TABLET | Freq: Every evening | ORAL | 5 refills | Status: DC

## 2021-03-15 MED ORDER — OLOPATADINE HCL 0.2 % OP SOLN
OPHTHALMIC | 5 refills | Status: DC
Start: 2021-03-15 — End: 2021-10-31

## 2021-03-15 MED ORDER — ALBUTEROL SULFATE HFA 108 (90 BASE) MCG/ACT IN AERS: INHALATION_SPRAY | RESPIRATORY_TRACT | 1 refills | Status: DC

## 2021-03-15 NOTE — Telephone Encounter (Signed)
Called patient and left a message that she left her yeti cup at the [place] office. Patient came back to the office and got her cup from the front office.

## 2021-03-15 NOTE — Progress Notes (Signed)
Follow-up Note  RE: [name] MRN: [id number] DOB: 1965/11/24 Date of Office Visit: 03/15/2021   History of present illness: [name] is a 56 y.o. female presenting today for follow-up of asthma not well controlled, allergic rhinitis with conjunctivitis, reflux and recurrent sinusitis.  She was last seen in the office on 01/28/2021 by our nurse practitioner Quita Skye.  She has not had any further antibiotics since last visit.  However she states she is not doing well.  She always feels like she has chest congestion and the need to cough and this leads to sore throat.  This also leads to hoarseness.  She did have prednisone 5 day course 2 weeks ago for the symptoms.  She feels that he just cannot get rid of the symptoms.  She was changed to [place] at the last visit from [place].  She feels like the [place] Ruts was working a little better and this may be attributed to the change from going from a dry powder to a aerosol inhaler.  She is still taking Singulair daily.  She has her albuterol that she uses when she has her symptoms as above. She also feels like her reflux is not controlled.  She currently is not taking any antireflux medications She also has been advised to take Xyzal for her antihistamine.  Her Astelin was stopped last visit as this was causing her to have emesis.  It was changed to ipratropium instead.  Review of systems: Review of Systems  Constitutional: Negative.   HENT:  Positive for congestion, sore throat and voice change.   Eyes: Negative.   Respiratory:  Positive for cough, shortness of breath and wheezing.   Cardiovascular: Negative.   Gastrointestinal: Negative.   Musculoskeletal: Negative.   Skin: Negative.   Allergic/Immunologic: Negative.   Neurological: Negative.     All other systems negative unless noted above in HPI  Past medical/social/surgical/family history have been reviewed and are unchanged unless specifically indicated below.  No  changes  Medication List: Current Outpatient Medications  Medication Sig Dispense Refill   aspirin 81 MG tablet Take 81 mg by mouth daily.     buPROPion (WELLBUTRIN XL) 300 MG 24 hr tablet Take 1 tablet (300 mg total) by mouth daily. 30 tablet 1   Calcium Carb-Cholecalciferol (OYSTER SHELL CALCIUM W/D) 500-5 MG-MCG TABS TAKE 1 TABLET BY MOUTH THREE TIMES A DAY 90 tablet 6   calcium-vitamin D (OSCAL WITH D) 500-200 MG-UNIT tablet Take 1 tablet by mouth 3 (three) times daily. 90 tablet 6   cyclobenzaprine (FLEXERIL) 10 MG tablet Take 10 mg by mouth 3 (three) times daily as needed for muscle spasms.     fluticasone (FLONASE) 50 MCG/ACT nasal spray Place 2 sprays into both nostrils daily. 16 g 6   Fluticasone-Umeclidin-Vilant (TRELEGY ELLIPTA) 200-62.5-25 MCG/ACT AEPB Inhale 1 puff into the lungs daily. 28 each 5   gabapentin (NEURONTIN) 300 MG capsule Take 1 capsule by mouth 3 (three) times daily.     levocetirizine (XYZAL) 5 MG tablet Take 1 tablet (5 mg total) by mouth every evening. 30 tablet 5   meloxicam (MOBIC) 7.5 MG tablet Take 1 tablet (7.5 mg total) by mouth 2 (two) times daily as needed for pain. 30 tablet 2   morphine (MSIR) 15 MG tablet Take 15 mg by mouth every 4 (four) hours as needed for severe pain.     simvastatin (ZOCOR) 20 MG tablet TAKE 1 TABLET BY MOUTH EVERY DAY AT 6PM 90 tablet 1  triamcinolone (NASACORT) 55 MCG/ACT AERO nasal inhaler      zonisamide (ZONEGRAN) 100 MG capsule Take 1 capsule (100 mg total) by mouth at bedtime. 30 capsule 1   albuterol (VENTOLIN HFA) 108 (90 Base) MCG/ACT inhaler TAKE 2 PUFFS BY MOUTH EVERY 4 TO 6 HOURS AS NEEDED 18 each 1   ipratropium (ATROVENT) 0.03 % nasal spray Place 2 sprays in each nostril twice a day as needed for runny nose/drainage down throat 30 mL 5   montelukast (SINGULAIR) 10 MG tablet TAKE 1 TABLET BY MOUTH EVERYDAY AT BEDTIME 30 tablet 5   Olopatadine HCl 0.2 % SOLN Place 1 drop in each eye once a day as needed for itchy  watery eyes 7.5 mL 5   pantoprazole (PROTONIX) 40 MG tablet Take 1 tablet (40 mg total) by mouth daily. 30 tablet 5   No current facility-administered medications for this visit.     Known medication allergies: Allergies  Allergen Reactions   Other    Effexor [Venlafaxine] Other (See Comments)    UNSPECIFIED REACTION, headaches, felt funny, withdrawal with missed dose    Lamictal [Lamotrigine] Rash     Physical examination: Blood pressure 124/78, pulse 81, temperature 97.9 F (36.6 C), resp. rate 16, height 5' 0.98" (1.549 m), weight 154 lb (69.9 kg), SpO2 99 %.  General: Alert, interactive, in no acute distress. HEENT: PERRLA, TMs pearly gray, turbinates minimally edematous without discharge, post-pharynx non erythematous. Neck: Supple without lymphadenopathy. Lungs: Decreased breath sounds bilaterally with rhonchi. {no increased work of breathing. CV: Normal S1, S2 without murmurs. Abdomen: Nondistended, nontender. Skin: Warm and dry, without lesions or rashes. Extremities:  No clubbing, cyanosis or edema. Neuro:   Grossly intact.  Diagnositics/Labs:  Spirometry: FEV1: 2.49L 106%, FVC: 2.92L 99%, ratio consistent with nonobstructive pattern  Assessment and plan: Allergic rhinitis with conjunctivitis (grass pollen, weed pollen, molds, and mouse) Continue Singulair 10 mg once a day Continue Xyzal 5 mg once a day as needed Continue Pataday 1 drop each eye once a day as needed for itchy watery eyes Use ipratropium bromide nasal spray using 2 sprays each nostril twice a day as needed for runny nose/drainage down throat Continue fluticasone nasal spray, but decrease to 2 sprays each nostril once a day as needed for stuffy nose. May use saline nasal rinse as needed for nasal symptoms. Use this prior to any medicated nasal spray.  Moderate persistent asthma  Change Breztri to Trelegy 257mcg 1 puff once a day.   Discussed stepping up therapy with an asthma biologic therapy and  will use Nucala monthly injections.  Benefits/risk and protocol discussed and handout provided.  Will notify Tammy to start approval process and she will call you in regards to this Continue Singulair 10 mg as above May use albuterol 2 puffs every 4 hours as needed for cough, wheeze, tightness in chest, or shortness of breath. Also, may use albuterol 2 puffs 5-15 minutes prior to exercise Imaging reviewed that she has had in the past year with hiatal hernia demonstrated on CT scan.  This is not new however.  She also had a groundglass opacities from the CT scan about a year ago.  Recent chest x-ray from December revealing mild and diffuse bilateral interstitial pulmonary opacity concerning for infection or edema.  Will need to monitor for this moving forward and possibly may need a pulmonary referral.   Recurrent sinusitis( received pneumovax on 05/30/2019) Your last strep pnuemonia titer test was still not protective however you received Prevnar  20 on 02/13/21 after this lab test was drawn.  It is not 4 weeks since Prevnar 20 and thus will get repeat strep titers at this time to determine if you did mount an adequate response.   Will get this lab today.   If you do not have an adequate response then we may need to proceed with immunoglobulin replacement therapy so you can get passive immunity for strep antibodies  Reflux Use pantoprazole daily for reflux control Continue dietary and lifestyle modifications as below  Follow-up in 6-8 weeks or sooner if needed  I appreciate the opportunity to take part in Devera's care. Please do not hesitate to contact me with questions.  Sincerely,   Prudy Feeler, MD Allergy/Immunology [facility] [place]

## 2021-03-15 NOTE — Patient Instructions (Addendum)
Allergic rhinitis with conjunctivitis (grass pollen, weed pollen, molds, and mouse) Continue Singulair 10 mg once a day Continue Xyzal 5 mg once a day as needed Continue Pataday 1 drop each eye once a day as needed for itchy watery eyes Use ipratropium bromide nasal spray using 2 sprays each nostril twice a day as needed for runny nose/drainage down throat Continue fluticasone nasal spray, but decrease to 2 sprays each nostril once a day as needed for stuffy nose. May use saline nasal rinse as needed for nasal symptoms. Use this prior to any medicated nasal spray.  Moderate persistent asthma  Change Breztri to Trelegy 233mcg 1 puff once a day.   Discussed stepping up therapy with an asthma biologic therapy and will use Nucala monthly injections.  Benefits/risk and protocol discussed and handout provided.  Will notify Tammy to start approval process and she will call you in regards to this Continue Singulair 10 mg as above May use albuterol 2 puffs every 4 hours as needed for cough, wheeze, tightness in chest, or shortness of breath. Also, may use albuterol 2 puffs 5-15 minutes prior to exercise  Recurrent sinusitis( received pneumovax on 05/30/2019) Your last strep pnuemonia titer test was still not protective however you received Prevnar 20 on 02/13/21 after this lab test was drawn.  It is not 4 weeks since Prevnar 20 and thus will get repeat strep titers at this time to determine if you did mount an adequate response.   Will get this lab today.   If you do not have an adequate response then we may need to proceed with immunoglobulin replacement therapy so you can get passive immunity for strep antibodies  Reflux Use pantoprazole daily for reflux control Continue dietary and lifestyle modifications as below  Follow-up in 6-8 weeks or sooner if needed  Lifestyle Changes for Controlling GERD When you have GERD, stomach acid feels as if its backing up toward your mouth. Whether or not you  take medication to control your GERD, your symptoms can often be improved with lifestyle changes.   Raise Your Head Reflux is more likely to strike when youre lying down flat, because stomach fluid can flow backward more easily. Raising the head of your bed 4-6 inches can help. To do this: Slide blocks or books under the legs at the head of your bed. Or, place a wedge under the mattress. Many foam stores can make a suitable wedge for you. The wedge should run from your waist to the top of your head. Dont just prop your head on several pillows. This increases pressure on your stomach. It can make GERD worse.  Watch Your Eating Habits Certain foods may increase the acid in your stomach or relax the lower esophageal sphincter, making GERD more likely. Its best to avoid the following: Coffee, tea, and carbonated drinks (with and without caffeine) Fatty, fried, or spicy food Mint, chocolate, onions, and tomatoes Any other foods that seem to irritate your stomach or cause you pain  Relieve the Pressure Eat smaller meals, even if you have to eat more often. Dont lie down right after you eat. Wait a few hours for your stomach to empty. Avoid tight belts and tight-fitting clothes. Lose excess weight.  Tobacco and Alcohol Avoid smoking tobacco and drinking alcohol. They can make GERD symptoms worse.

## 2021-03-22 ENCOUNTER — Telehealth: Payer: Self-pay | Admitting: *Deleted

## 2021-03-22 NOTE — Telephone Encounter (Signed)
L/m for patient to contact me to dicsuss Nucala through patient assistance program

## 2021-03-22 NOTE — Telephone Encounter (Signed)
-----   Message from [place], MD sent at 03/15/2021  5:18 PM EST ----- Discussed starting Nucala for this patient for her asthma.  Can you start approval process.  She had 200 eosinophils from November 2022

## 2021-03-26 NOTE — Telephone Encounter (Signed)
Patient reached out and l/m for me so I called and had to leave message again for patient

## 2021-03-28 ENCOUNTER — Ambulatory Visit: Payer: Medicare HMO | Admitting: Allergy

## 2021-03-28 ENCOUNTER — Encounter: Payer: Self-pay | Admitting: Allergy

## 2021-03-28 ENCOUNTER — Other Ambulatory Visit: Payer: Self-pay

## 2021-03-28 VITALS — BP 128/78 | HR 91 | Temp 98.6°F | Resp 12 | Ht 61.0 in | Wt 154.2 lb

## 2021-03-28 DIAGNOSIS — J3089 Other allergic rhinitis: Secondary | ICD-10-CM

## 2021-03-28 DIAGNOSIS — H1013 Acute atopic conjunctivitis, bilateral: Secondary | ICD-10-CM | POA: Diagnosis not present

## 2021-03-28 DIAGNOSIS — K219 Gastro-esophageal reflux disease without esophagitis: Secondary | ICD-10-CM | POA: Diagnosis not present

## 2021-03-28 DIAGNOSIS — R760 Raised antibody titer: Secondary | ICD-10-CM | POA: Diagnosis not present

## 2021-03-28 DIAGNOSIS — J4540 Moderate persistent asthma, uncomplicated: Secondary | ICD-10-CM

## 2021-03-28 MED ORDER — PREDNISONE 10 MG PO TABS: ORAL_TABLET | ORAL | 0 refills | Status: AC

## 2021-03-28 MED ORDER — HYDROCOD POLI-CHLORPHE POLI ER 10-8 MG/5ML PO SUER: 5.0000 mL | Freq: Every evening | ORAL | 0 refills | Status: DC | PRN

## 2021-03-28 NOTE — Patient Instructions (Signed)
Allergic rhinitis with conjunctivitis (grass pollen, weed pollen, molds, and mouse) Continue Singulair 10 mg once a day Continue Xyzal 5 mg once a day as needed Continue Pataday 1 drop each eye once a day as needed for itchy watery eyes Continue ipratropium bromide nasal spray using 2 sprays each nostril twice a day as needed for runny nose/drainage down throat Continue fluticasone nasal spray, but decrease to 2 sprays each nostril once a day as needed for stuffy nose. May use saline nasal rinse as needed for nasal symptoms. Use this prior to any medicated nasal spray.  Moderate persistent asthma  Continue Trelegy 274mcg 1 puff once a day.   We are in the process of getting Nucala monthly injections approved for you.  This medication is a asthma medication that should help decrease symptoms, flares and need for steroids.  Schedule for a new start appointment next week and we will give you a sample of Nucala to get started.   Continue Singulair 10 mg as above For current symptoms take Prednisone 20mg  twice a day x 3 days, then 10mg  twice a day x 3 days then 10mg  daily x 3 days and stop.   For nighttime cough use Tussionex 91ml at bedtime as needed for cough May use albuterol 2 puffs every 4 hours as needed for cough, wheeze, tightness in chest, or shortness of breath. Also, may use albuterol 2 puffs 5-15 minutes prior to exercise  Recurrent sinusitis( received pneumovax on 05/30/2019) Currently waiting for your strep titers to come back.  We will call you with results.  If you do not have an adequate response then we may need to proceed with immunoglobulin replacement therapy so you can get passive immunity for strep antibodies  Reflux Use pantoprazole daily for reflux control Continue dietary and lifestyle modifications as below  Follow-up in 6-8 weeks or sooner if needed  Lifestyle Changes for Controlling GERD When you have GERD, stomach acid feels as if its backing up toward your  mouth. Whether or not you take medication to control your GERD, your symptoms can often be improved with lifestyle changes.   Raise Your Head Reflux is more likely to strike when youre lying down flat, because stomach fluid can flow backward more easily. Raising the head of your bed 4-6 inches can help. To do this: Slide blocks or books under the legs at the head of your bed. Or, place a wedge under the mattress. Many foam stores can make a suitable wedge for you. The wedge should run from your waist to the top of your head. Dont just prop your head on several pillows. This increases pressure on your stomach. It can make GERD worse.  Watch Your Eating Habits Certain foods may increase the acid in your stomach or relax the lower esophageal sphincter, making GERD more likely. Its best to avoid the following: Coffee, tea, and carbonated drinks (with and without caffeine) Fatty, fried, or spicy food Mint, chocolate, onions, and tomatoes Any other foods that seem to irritate your stomach or cause you pain  Relieve the Pressure Eat smaller meals, even if you have to eat more often. Dont lie down right after you eat. Wait a few hours for your stomach to empty. Avoid tight belts and tight-fitting clothes. Lose excess weight.  Tobacco and Alcohol Avoid smoking tobacco and drinking alcohol. They can make GERD symptoms worse.

## 2021-03-28 NOTE — Progress Notes (Signed)
Follow-up Note  RE: Shylo Zamor MRN: [id number] DOB: 12/03/1965 Date of Office Visit: 03/28/2021   History of present illness: Emmaleigh Longo is a 56 y.o. female presenting today for sick visit.  She was last seen in the office on 03/15/21 by myself. She has history of asthma, allergic rhinitis with conjunctivitis, reflux and recurrent sinusitis.   She presents today as she states her symptoms are worsening and she is coughing more and it is making her throat sore.  She is having hoarseness due to the coughing and sore throat.  She is not sleeping well due to this cough.  She has a family member who is hard of hearing and thus she has to speak louder and this is straining her voice even more.  She does feel the Trelegy is more effective as an asthma controller than Breztri.  She continues her usual medications of singulair and is using albuterol as needed.   She also uses xyzal as needed as well as pataday for her eyes and atrovent for drainage control, flonase for congestion control.   She uses pantoprazole for reflux.  We are awaiting her lab results for her strep titer s/p receiving prevnar20.    Review of systems: Review of Systems  Constitutional: Negative.   HENT:  Positive for postnasal drip, rhinorrhea and voice change.   Eyes: Negative.   Respiratory:  Positive for cough.   Cardiovascular: Negative.   Gastrointestinal: Negative.   Musculoskeletal: Negative.   Skin: Negative.   Allergic/Immunologic: Negative.   Neurological: Negative.     All other systems negative unless noted above in HPI  Past medical/social/surgical/family history have been reviewed and are unchanged unless specifically indicated below.  No changes  Medication List: Current Outpatient Medications  Medication Sig Dispense Refill   albuterol (VENTOLIN HFA) 108 (90 Base) MCG/ACT inhaler TAKE 2 PUFFS BY MOUTH EVERY 4 TO 6 HOURS AS NEEDED 18 each 1   aspirin 81 MG tablet Take 81 mg by mouth daily.      buPROPion (WELLBUTRIN XL) 300 MG 24 hr tablet Take 1 tablet (300 mg total) by mouth daily. 30 tablet 1   Calcium Carb-Cholecalciferol (OYSTER SHELL CALCIUM W/D) 500-5 MG-MCG TABS TAKE 1 TABLET BY MOUTH THREE TIMES A DAY 90 tablet 6   calcium-vitamin D (OSCAL WITH D) 500-200 MG-UNIT tablet Take 1 tablet by mouth 3 (three) times daily. 90 tablet 6   chlorpheniramine-HYDROcodone (TUSSIONEX PENNKINETIC ER) 10-8 MG/5ML Take 5 mLs by mouth at bedtime as needed for cough. 50 mL 0   cyclobenzaprine (FLEXERIL) 10 MG tablet Take 10 mg by mouth 3 (three) times daily as needed for muscle spasms.     fluticasone (FLONASE) 50 MCG/ACT nasal spray Place 2 sprays into both nostrils daily. 16 g 6   Fluticasone-Umeclidin-Vilant (TRELEGY ELLIPTA) 200-62.5-25 MCG/ACT AEPB Inhale 1 puff into the lungs daily. 28 each 5   gabapentin (NEURONTIN) 300 MG capsule Take 1 capsule by mouth 3 (three) times daily.     ipratropium (ATROVENT) 0.03 % nasal spray Place 2 sprays in each nostril twice a day as needed for runny nose/drainage down throat 30 mL 5   levocetirizine (XYZAL) 5 MG tablet Take 1 tablet (5 mg total) by mouth every evening. 30 tablet 5   meloxicam (MOBIC) 7.5 MG tablet Take 1 tablet (7.5 mg total) by mouth 2 (two) times daily as needed for pain. 30 tablet 2   montelukast (SINGULAIR) 10 MG tablet TAKE 1 TABLET BY MOUTH EVERYDAY  AT BEDTIME 30 tablet 5   morphine (MSIR) 15 MG tablet Take 15 mg by mouth every 4 (four) hours as needed for severe pain.     Olopatadine HCl 0.2 % SOLN Place 1 drop in each eye once a day as needed for itchy watery eyes 7.5 mL 5   pantoprazole (PROTONIX) 40 MG tablet Take 1 tablet (40 mg total) by mouth daily. 30 tablet 5   predniSONE (DELTASONE) 10 MG tablet Take 2 tablets (20 mg total) by mouth 2 (two) times daily with a meal for 3 days, THEN 1 tablet (10 mg total) 2 (two) times daily with a meal for 3 days, THEN 1 tablet (10 mg total) daily with breakfast for 3 days. 21 tablet 0    simvastatin (ZOCOR) 20 MG tablet TAKE 1 TABLET BY MOUTH EVERY DAY AT 6PM 90 tablet 1   triamcinolone (NASACORT) 55 MCG/ACT AERO nasal inhaler      zonisamide (ZONEGRAN) 100 MG capsule Take 1 capsule (100 mg total) by mouth at bedtime. 30 capsule 1   No current facility-administered medications for this visit.     Known medication allergies: Allergies  Allergen Reactions   Other    Effexor [Venlafaxine] Other (See Comments)    UNSPECIFIED REACTION, headaches, felt funny, withdrawal with missed dose    Lamictal [Lamotrigine] Rash     Physical examination: Blood pressure 128/78, pulse 91, temperature 98.6 F (37 C), temperature source Temporal, resp. rate 12, height 5\' 1"  (1.549 m), weight 154 lb 3.2 oz (69.9 kg), SpO2 98 %.  General: Alert, interactive, in no acute distress. HEENT: PERRLA, TMs pearly gray, turbinates mildly edematous with clear discharge, post-pharynx mildly erythematous. Neck: Supple without lymphadenopathy. Lungs: Mildly decreased breath sounds bilaterally without wheezing, rhonchi or rales. {no increased work of breathing. CV: Normal S1, S2 without murmurs. Abdomen: Nondistended, nontender. Skin: Warm and dry, without lesions or rashes. Extremities:  No clubbing, cyanosis or edema. Neuro:   Grossly intact.  Diagnositics/Labs: None today  Assessment and plan:   Allergic rhinitis with conjunctivitis (grass pollen, weed pollen, molds, and mouse) Continue Singulair 10 mg once a day Continue Xyzal 5 mg once a day as needed Continue Pataday 1 drop each eye once a day as needed for itchy watery eyes Continue ipratropium bromide nasal spray using 2 sprays each nostril twice a day as needed for runny nose/drainage down throat Continue fluticasone nasal spray, but decrease to 2 sprays each nostril once a day as needed for stuffy nose. May use saline nasal rinse as needed for nasal symptoms. Use this prior to any medicated nasal spray.  Moderate persistent asthma   Continue Trelegy 283mcg 1 puff once a day.   We are in the process of getting Nucala monthly injections approved for you.  This medication is a asthma medication that should help decrease symptoms, flares and need for steroids.  Schedule for a new start appointment next week and we will give you a sample of Nucala to get started.   Continue Singulair 10 mg as above For current symptoms take Prednisone 20mg  twice a day x 3 days, then 10mg  twice a day x 3 days then 10mg  daily x 3 days and stop.   For nighttime cough use Tussionex 79ml at bedtime as needed for cough May use albuterol 2 puffs every 4 hours as needed for cough, wheeze, tightness in chest, or shortness of breath. Also, may use albuterol 2 puffs 5-15 minutes prior to exercise  Recurrent sinusitis (received pneumovax on  05/30/2019) Currently waiting for your strep titers to come back.  We will call you with results.  If you do not have an adequate response then we may need to proceed with immunoglobulin replacement therapy so you can get passive immunity for strep antibodies  Reflux Use pantoprazole daily for reflux control Continue dietary and lifestyle modifications as below  Follow-up in 6-8 weeks or sooner if needed  Lifestyle Changes for Controlling GERD When you have GERD, stomach acid feels as if its backing up toward your mouth. Whether or not you take medication to control your GERD, your symptoms can often be improved with lifestyle changes.   Raise Your Head Reflux is more likely to strike when youre lying down flat, because stomach fluid can flow backward more easily. Raising the head of your bed 4-6 inches can help. To do this: Slide blocks or books under the legs at the head of your bed. Or, place a wedge under the mattress. Many foam stores can make a suitable wedge for you. The wedge should run from your waist to the top of your head. Dont just prop your head on several pillows. This increases pressure on  your stomach. It can make GERD worse.  Watch Your Eating Habits Certain foods may increase the acid in your stomach or relax the lower esophageal sphincter, making GERD more likely. Its best to avoid the following: Coffee, tea, and carbonated drinks (with and without caffeine) Fatty, fried, or spicy food Mint, chocolate, onions, and tomatoes Any other foods that seem to irritate your stomach or cause you pain  Relieve the Pressure Eat smaller meals, even if you have to eat more often. Dont lie down right after you eat. Wait a few hours for your stomach to empty. Avoid tight belts and tight-fitting clothes. Lose excess weight.  Tobacco and Alcohol Avoid smoking tobacco and drinking alcohol. They can make GERD symptoms worse.  No follow-ups on file.  I appreciate the opportunity to take part in Cora's care. Please do not hesitate to contact me with questions.  Sincerely,   Prudy Feeler, MD Allergy/Immunology [facility]

## 2021-04-02 LAB — STREP PNEUMONIAE 23 SEROTYPES IGG
Pneumo Ab Type 1*: 0.3 ug/mL — ABNORMAL LOW (ref >1.3)
Pneumo Ab Type 12 (12F)*: <0.1 ug/mL — ABNORMAL LOW (ref >1.3)
Pneumo Ab Type 14*: 1.1 ug/mL — ABNORMAL LOW (ref >1.3)
Pneumo Ab Type 17 (17F)*: 3.1 ug/mL (ref >1.3)
Pneumo Ab Type 19 (19F)*: 5.1 ug/mL (ref >1.3)
Pneumo Ab Type 2*: 6.1 ug/mL (ref >1.3)
Pneumo Ab Type 20*: 9.3 ug/mL (ref >1.3)
Pneumo Ab Type 22 (22F)*: 0.6 ug/mL — ABNORMAL LOW (ref >1.3)
Pneumo Ab Type 23 (23F)*: 0.2 ug/mL — ABNORMAL LOW (ref >1.3)
Pneumo Ab Type 26 (6B)*: 1.1 ug/mL — ABNORMAL LOW (ref >1.3)
Pneumo Ab Type 3*: 0.2 ug/mL — ABNORMAL LOW (ref >1.3)
Pneumo Ab Type 34 (10A)*: 1.5 ug/mL (ref >1.3)
Pneumo Ab Type 4*: 0.9 ug/mL — ABNORMAL LOW (ref >1.3)
Pneumo Ab Type 43 (11A)*: 0.2 ug/mL — ABNORMAL LOW (ref >1.3)
Pneumo Ab Type 5*: <0.1 ug/mL — ABNORMAL LOW (ref >1.3)
Pneumo Ab Type 51 (7F)*: 0.7 ug/mL — ABNORMAL LOW (ref >1.3)
Pneumo Ab Type 54 (15B)*: 4.7 ug/mL (ref >1.3)
Pneumo Ab Type 56 (18C)*: 2.2 ug/mL (ref >1.3)
Pneumo Ab Type 57 (19A)*: 1.2 ug/mL — ABNORMAL LOW (ref >1.3)
Pneumo Ab Type 68 (9V)*: 0.4 ug/mL — ABNORMAL LOW (ref >1.3)
Pneumo Ab Type 70 (33F)*: 1 ug/mL — ABNORMAL LOW (ref >1.3)
Pneumo Ab Type 8*: 5.5 ug/mL (ref >1.3)
Pneumo Ab Type 9 (9N)*: 0.4 ug/mL — ABNORMAL LOW (ref >1.3)

## 2021-04-03 ENCOUNTER — Ambulatory Visit (INDEPENDENT_AMBULATORY_CARE_PROVIDER_SITE_OTHER): Payer: Medicare HMO | Admitting: *Deleted

## 2021-04-03 ENCOUNTER — Other Ambulatory Visit: Payer: Self-pay

## 2021-04-03 DIAGNOSIS — J4540 Moderate persistent asthma, uncomplicated: Secondary | ICD-10-CM | POA: Diagnosis not present

## 2021-04-03 MED ORDER — MEPOLIZUMAB 100 MG ~~LOC~~ SOLR
100.0000 mg | SUBCUTANEOUS | Status: AC
  Administered 2021-04-03 – 2023-03-31 (×25): 100 mg via SUBCUTANEOUS

## 2021-04-03 NOTE — Progress Notes (Signed)
Immunotherapy   Patient Details  Name: [name] MRN: [id number] Date of Birth: Jul 24, 1965  04/03/2021  [name] started injections for  Nucala  Frequency: Every 4 Weeks  Epi-Pen: Not Required  Consent signed and patient instructions given. Patient started Nucala today and receive 38mL Nucala Sample in the [facility] and waited 30 minutes in office without issue.   [name] 04/03/2021, 11:22 AM

## 2021-04-04 NOTE — Telephone Encounter (Signed)
Have gotten app signed and sent to [facility] and will be in touch regarding PAP

## 2021-04-05 ENCOUNTER — Telehealth: Payer: Self-pay | Admitting: Allergy

## 2021-04-05 NOTE — Telephone Encounter (Signed)
Patient would like Dr. Nelva Bush to send in something for her sore throat. Patient states she still feels horrible and her throat feels raw. Patient states she has finished the cough syrup Dr. Nelva Bush gave her at last visit. Patient is unsure whether she has strep or not.   [facility] - [address], [place] [address]

## 2021-04-05 NOTE — Telephone Encounter (Signed)
Informed of Dr. Jeralyn Ruths advice.

## 2021-04-09 ENCOUNTER — Telehealth (HOSPITAL_BASED_OUTPATIENT_CLINIC_OR_DEPARTMENT_OTHER): Payer: Medicare HMO | Admitting: Psychiatry

## 2021-04-09 ENCOUNTER — Encounter (HOSPITAL_COMMUNITY): Payer: Self-pay | Admitting: Psychiatry

## 2021-04-09 ENCOUNTER — Other Ambulatory Visit: Payer: Self-pay

## 2021-04-09 DIAGNOSIS — F3175 Bipolar disorder, in partial remission, most recent episode depressed: Secondary | ICD-10-CM

## 2021-04-09 MED ORDER — BUPROPION HCL ER (XL) 300 MG PO TB24: 300.0000 mg | ORAL_TABLET | Freq: Every day | ORAL | 1 refills | Status: DC

## 2021-04-09 MED ORDER — ZONISAMIDE 100 MG PO CAPS: 100.0000 mg | ORAL_CAPSULE | Freq: Every day | ORAL | 1 refills | Status: DC

## 2021-04-09 NOTE — Progress Notes (Signed)
BH MD/PA/NP OP Progress Note  04/09/2021 1:14 PM [name]  MRN:  [id number]  Chief Complaint:   medication management  This was a phone based visit . Limitations associated with this type of communication have been reviewed and patient's identity has been confirmed with two separate identifiers . Location of parties  [facility]  Duration 15 minutes  HPI: Ms . [name] is a 56 year old female . She was seeing [name] for outpatient psychiatric management . He has retired ( last seen by [name]. On 03/15/2020 ) .    56 year old single female, lives alone, on disability.   Ms. [name] reports she has generally been doing well. States she is functioning well in daily activities. Describes stable mood and presents euthymic with reactive affect.  No SI.  No psychotic symptoms noted or endorsed. She feels her current medication regimen is well-tolerated and helpful.  Does not endorse side effects. Reports remote history of AUD.  States she has remained abstinent, in sustained remission She does report she has had some medical issues, particularly related to chronic asthma, for which she has been following with her PCP.        Visit Diagnosis: Bipolar Disorder II by history   Past Psychiatric History: Reports history of Bipolar Disorder diagnosis. Chart notes also indicate history of Borderline Personality Disorder .  Past history of Alcohol Use Disorder , reports has been sober /abstinent for several years .   Past Medical History:  Past Medical History:  Diagnosis Date   Anxiety    Arthritis    neck, knees, shoulders   Asthma    Bipolar disorder ([place])    currently feeling MANIC- 10/02/2016   Depression    GERD (gastroesophageal reflux disease)    Hip fracture ([place]) 06/2019   History of blood transfusion    as a newborn    History of lump of left breast    Hyperlipidemia    Lumbar pseudoarthrosis    Motion sickness    cars   OSA  (obstructive sleep apnea) 01/09/2016   can't afford CPAP   Personality disorder (HCC)    PONV (postoperative nausea and vomiting)    Post traumatic stress disorder (PTSD)    Substance abuse ([place])    Synovial cyst     Past Surgical History:  Procedure Laterality Date   COLONOSCOPY WITH PROPOFOL N/A 11/04/2017   Procedure: COLONOSCOPY WITH PROPOFOL;  Surgeon: Lin Landsman, MD;  Location: [place];  Service: Endoscopy;  Laterality: N/A;   ESOPHAGOGASTRODUODENOSCOPY (EGD) WITH PROPOFOL N/A 11/04/2017   Procedure: ESOPHAGOGASTRODUODENOSCOPY (EGD) WITH PROPOFOL with biopsies;  Surgeon: Lin Landsman, MD;  Location: [place];  Service: Endoscopy;  Laterality: N/A;  sleep apnea   KNEE SURGERY Left    x5, post basketball injury   LUMBAR LAMINECTOMY/DECOMPRESSION MICRODISCECTOMY Left 01/22/2016   Procedure: Laminectomy for facet/synovial cyst - left - Lumbar four - lumbar five;  Surgeon: Earnie Larsson, MD;  Location: [place];  Service: Neurosurgery;  Laterality: Left;  Laminectomy for facet/synovial cyst - left - Lumbar four - lumbar five   POLYPECTOMY N/A 11/04/2017   Procedure: POLYPECTOMY INTESTINAL;  Surgeon: Lin Landsman, MD;  Location: [place];  Service: Endoscopy;  Laterality: N/A;   SHOULDER SURGERY Left    x2   SPINE SURGERY N/A    [employer] 08/07/2019   TOE SURGERY Bilateral    bone spurs    Family Psychiatric History:  As below   Family History:  Family History  Problem Relation Age of Onset   Heart disease Father    Hyperlipidemia Father    Alcohol abuse Brother    Alcohol abuse Paternal Uncle    Breast cancer Maternal Aunt        70's   Colon cancer Neg Hx     Social History: Single,no children,  lives alone, on disability, focuses on taking care of her elderly parents  Social History   Socioeconomic History   Marital status: Single    Spouse name: Not on file   Number of children: 0   Years of education: Not on file    Highest education level: Bachelor's degree (e.g., BA, AB, BS)  Occupational History   Not on file  Tobacco Use   Smoking status: Former    Packs/day: 1.00    Years: 1.00    Pack years: 1.00    Types: Cigarettes    Quit date: 04/15/1996    Years since quitting: 25.0   Smokeless tobacco: Never   Tobacco comments:    started back again in 2016, smoked for about 6 mo and then quit  Vaping Use   Vaping Use: Never used  Substance and Sexual Activity   Alcohol use: No    Alcohol/week: 0.0 standard drinks    Comment: quit 20 years   Drug use: Yes    Frequency: 14.0 times per week    Types: Marijuana    Comment: 20 yrs. ago- cocaine    Sexual activity: Not Currently  Other Topics Concern   Not on file  Social History Narrative   Not on file   Social Determinants of Health   Financial Resource Strain: Not on file  Food Insecurity: Not on file  Transportation Needs: Not on file  Physical Activity: Not on file  Stress: Not on file  Social Connections: Not on file    Allergies:  Allergies  Allergen Reactions   Other    Effexor [Venlafaxine] Other (See Comments)    UNSPECIFIED REACTION, headaches, felt funny, withdrawal with missed dose    Lamictal [Lamotrigine] Rash    Metabolic Disorder Labs: Lab Results  Component Value Date   HGBA1C 5.6 06/22/2018   No results found for: PROLACTIN Lab Results  Component Value Date   CHOL 168 08/10/2019   TRIG 103 08/10/2019   HDL 71 08/10/2019   CHOLHDL 2.4 08/10/2019   VLDL 22.2 06/22/2018   LDLCALC 79 08/10/2019   LDLCALC 79 06/22/2018   Lab Results  Component Value Date   TSH 1.310 04/05/2020   TSH 1.30 03/15/2015    Therapeutic Level Labs: No results found for: LITHIUM No results found for: VALPROATE No components found for:  CBMZ  Current Medications: Current Outpatient Medications  Medication Sig Dispense Refill   albuterol (VENTOLIN HFA) 108 (90 Base) MCG/ACT inhaler TAKE 2 PUFFS BY MOUTH EVERY 4 TO 6 HOURS  AS NEEDED 18 each 1   aspirin 81 MG tablet Take 81 mg by mouth daily.     buPROPion (WELLBUTRIN XL) 300 MG 24 hr tablet Take 1 tablet (300 mg total) by mouth daily. 30 tablet 1   Calcium Carb-Cholecalciferol (OYSTER SHELL CALCIUM W/D) 500-5 MG-MCG TABS TAKE 1 TABLET BY MOUTH THREE TIMES A DAY 90 tablet 6   calcium-vitamin D (OSCAL WITH D) 500-200 MG-UNIT tablet Take 1 tablet by mouth 3 (three) times daily. 90 tablet 6   chlorpheniramine-HYDROcodone (TUSSIONEX PENNKINETIC ER) 10-8 MG/5ML Take 5 mLs  by mouth at bedtime as needed for cough. 50 mL 0   cyclobenzaprine (FLEXERIL) 10 MG tablet Take 10 mg by mouth 3 (three) times daily as needed for muscle spasms.     fluticasone (FLONASE) 50 MCG/ACT nasal spray Place 2 sprays into both nostrils daily. 16 g 6   Fluticasone-Umeclidin-Vilant (TRELEGY ELLIPTA) 200-62.5-25 MCG/ACT AEPB Inhale 1 puff into the lungs daily. 28 each 5   gabapentin (NEURONTIN) 300 MG capsule Take 1 capsule by mouth 3 (three) times daily.     ipratropium (ATROVENT) 0.03 % nasal spray Place 2 sprays in each nostril twice a day as needed for runny nose/drainage down throat 30 mL 5   levocetirizine (XYZAL) 5 MG tablet Take 1 tablet (5 mg total) by mouth every evening. 30 tablet 5   meloxicam (MOBIC) 7.5 MG tablet Take 1 tablet (7.5 mg total) by mouth 2 (two) times daily as needed for pain. 30 tablet 2   montelukast (SINGULAIR) 10 MG tablet TAKE 1 TABLET BY MOUTH EVERYDAY AT BEDTIME 30 tablet 5   morphine (MSIR) 15 MG tablet Take 15 mg by mouth every 4 (four) hours as needed for severe pain.     Olopatadine HCl 0.2 % SOLN Place 1 drop in each eye once a day as needed for itchy watery eyes 7.5 mL 5   pantoprazole (PROTONIX) 40 MG tablet Take 1 tablet (40 mg total) by mouth daily. 30 tablet 5   simvastatin (ZOCOR) 20 MG tablet TAKE 1 TABLET BY MOUTH EVERY DAY AT 6PM 90 tablet 1   triamcinolone (NASACORT) 55 MCG/ACT AERO nasal inhaler      zonisamide (ZONEGRAN) 100 MG capsule Take 1  capsule (100 mg total) by mouth at bedtime. 30 capsule 1   Current Facility-Administered Medications  Medication Dose Route Frequency Provider Last Rate Last Admin   mepolizumab (NUCALA) injection 100 mg  100 mg Subcutaneous Q28 days Kennith Gain, MD   100 mg at 04/03/21 1136     Musculoskeletal: Strength & Muscle Tone: within normal limits Gait & Station: normal Patient leans: N/A  Psychiatric Specialty Exam: note limitations in obtaining a MSE in the context of phone communication Review of Systems reports chronic back pain.   There were no vitals taken for this visit.There is no height or weight on file to calculate BMI.  General Appearance: NA  Eye Contact:  NA  Speech:  Normal Rate  Volume:  Normal  Mood:  she reports mood as stable and presents euthymic  Affect:  presents reactive /full range  Thought Process:  Linear/ intact   Orientation:  Full (Time, Place, and Person)  Thought Content: denies hallucinations , no delusions expressed, denies suicidal or self injurious thoughts  Suicidal Thoughts:  No denies self injurious or suicidal ideations, presents future oriented  Homicidal Thoughts:  No  Memory:   grossly intact   Judgement:  Other:  present   Insight:  Present  Psychomotor Activity:  NA  Concentration:  Concentration: Good  Recall:  Good  Fund of Knowledge: Good  Language: Good  Akathisia:  Negative  Handed:  Right  AIMS (if indicated): not done  Assets:  Communication Skills Housing Others:  close to her parents, whom she helps care for  ADL's:  Intact  Cognition: WNL  Sleep:   reports stable    Screenings: Mini-Mental    [place] Office Visit from 04/17/2020 in Primary Care at [facility]  Total Score (max 30 points ) 30  [place] Office Visit from 02/13/2021 in Primary Care at [facility] Visit from 12/19/2020 in Primary Care at [facility] Visit from 04/17/2020 in [place] at  [facility] Visit from 06/22/2018 in [place] at [facility] Visit from 05/21/2017 in [place] at [facility] Total Score 2 3 0 0 0  PHQ-9 Total Score 12 13 -- -- --        Assessment and Plan:  56year old female, history of Bipolar Disorder , Borderline P. D  per chart , Alcohol Use Disorder in sustained remission, Cannabis Use Disorder  Had been following with Janice Montel Culver for medication management , who has retired .   Currently reports her mood has been stable and presents euthymic, with a reactive affect..  No significant or worsening neurovegetative symptoms reported.  States she has been tolerating medications well.  Reports medications have been helpful.  As noted in previous visits, she reports she has been on Zonegran for more than a year and has found this medication to be helpful/well-tolerated.  She is aware that its use for mood disorder is off label.  Side effects have been reviewed. She is currently following with an allergist for management of asthma/seasonal allergies.   Renew Welllbutrin XL 300 mgr QDAY for depression ( 1 m, 1 refill)  Renew Zonisamide 100 mgr QHS for mood disorder  ( 1 m,  1 refill )  Will see in about 6 weeks , agrees to contact clinic sooner if any worsening or medication concern prior She has number for front desk staff and also for crisis hotline if needed  Jenne Campus, MD 04/09/2021, 1:14 PM   Patient ID: [name], female   DOB: Feb 23, 1966, 56 y.o.   MRN: [id number]

## 2021-04-11 DIAGNOSIS — S32010A Wedge compression fracture of first lumbar vertebra, initial encounter for closed fracture: Secondary | ICD-10-CM | POA: Diagnosis not present

## 2021-05-17 ENCOUNTER — Other Ambulatory Visit: Payer: Self-pay

## 2021-05-17 ENCOUNTER — Encounter: Payer: Self-pay | Admitting: Allergy

## 2021-05-17 ENCOUNTER — Ambulatory Visit: Payer: Medicare HMO | Admitting: Allergy

## 2021-05-17 VITALS — BP 124/80 | HR 88 | Temp 97.9°F | Resp 18 | Ht 61.0 in | Wt 154.2 lb

## 2021-05-17 DIAGNOSIS — K219 Gastro-esophageal reflux disease without esophagitis: Secondary | ICD-10-CM

## 2021-05-17 DIAGNOSIS — H1013 Acute atopic conjunctivitis, bilateral: Secondary | ICD-10-CM | POA: Diagnosis not present

## 2021-05-17 DIAGNOSIS — J4540 Moderate persistent asthma, uncomplicated: Secondary | ICD-10-CM

## 2021-05-17 DIAGNOSIS — J3089 Other allergic rhinitis: Secondary | ICD-10-CM | POA: Diagnosis not present

## 2021-05-17 NOTE — Patient Instructions (Addendum)
Allergic rhinitis with conjunctivitis (grass pollen, weed pollen, molds, and mouse) ?Continue Singulair 10 mg once a day ?Continue Xyzal 5 mg once a day as needed ?Continue Pataday 1 drop each eye once a day as needed for itchy watery eyes ?Continue ipratropium bromide nasal spray using 2 sprays each nostril twice a day as needed for runny nose/drainage down throat ?Continue fluticasone nasal spray 2 sprays each nostril once a day for stuffy nose. ?May use saline nasal rinse as needed for nasal symptoms. Use this prior to any medicated nasal spray. ? ?Moderate persistent asthma  ?Continue Trelegy 244mg 1 puff once a day.   ?We are in the process of getting Nucala monthly injections approved through the medication assistance program.  Tammy will notify you when you have been approved to receive monthly injections.  ?Continue Singulair 10 mg as above ?May use albuterol 2 puffs every 4 hours as needed for cough, wheeze, tightness in chest, or shortness of breath. Also, may use albuterol 2 puffs 5-15 minutes prior to exercise ? ?Recurrent sinusitis  ?Strep pneumonia titers are better but she still does not have great protection.  She now has 35% protective rate up from 22% previously.   Let see how you do with infections and if you still struggle with viral or bacterial illnesses then will have to discuss immunoglobulin replacement therapy ? ?Reflux ?Use pantoprazole daily for reflux control ?Continue dietary and lifestyle modifications as below ? ?Follow-up in 2-3 months or sooner if needed ?

## 2021-05-17 NOTE — Progress Notes (Signed)
? ? ?Follow-up Note ? ?RE: [name] MRN: [id number] DOB: 10-12-1965 ?Date of Office Visit: 05/17/2021 ? ? ?History of present illness: ?[name] is a 56 y.o. female presenting today for follow-up of asthma and allergic rhinitis with conjunctivitis.  She was last seen in the office on 03/28/2021 by myself.  At that visit she was not doing great with increase in asthma symptoms as well as allergy symptoms and significant sore throat.  At that visit they provided with a 9-day prednisone burst as well as Tussionex to help with the cough and throat symptoms.  She states her symptoms have improved.  The sore throat is much better and is no longer an issue.  She has at one point when the sore throat was so bad she could not swallow even take her medicines.  She still however reports significant about the nasal congestion and sinus pressure.  The nasal spray she is helpful but does not completely resolve this issue.  She is using nasal ipratropium as well as fluticasone.  She does take Singulair and Xyzal daily as well.  She does feel the xyzal helps.  She is performing nasal rinses about twice a day.  She will use her nasal spray after she flushes.   ?With her asthma symptoms she states that this point the albuterol is used rarely.  She continues Trelegy 1 puff once a day.  We did start her on Nucala and provided with a sample back on 04/03/2021.  She is due for her next dose this month.  We are however awaiting for her to be enrolled in the patient assistance program to get the drug. ?She has not had any further antibiotics or steroids since her last visit.   ? ?Review of systems: ?Review of Systems  ?Constitutional: Negative.   ?HENT:  Positive for congestion.   ?Eyes: Negative.   ?Respiratory: Negative.    ?Cardiovascular: Negative.   ?Gastrointestinal: Negative.   ?Musculoskeletal: Negative.   ?Skin: Negative.   ?Allergic/Immunologic: Negative.   ?Neurological: Negative.    ? ?All other systems negative unless  noted above in HPI ? ?Past medical/social/surgical/family history have been reviewed and are unchanged unless specifically indicated below. ? ?No changes ? ?Medication List: ?Current Outpatient Medications  ?Medication Sig Dispense Refill  ? aspirin 81 MG tablet Take 81 mg by mouth daily.    ? buPROPion (WELLBUTRIN XL) 300 MG 24 hr tablet Take 1 tablet (300 mg total) by mouth daily. 30 tablet 1  ? Calcium Carb-Cholecalciferol (OYSTER SHELL CALCIUM W/D) 500-5 MG-MCG TABS TAKE 1 TABLET BY MOUTH THREE TIMES A DAY 90 tablet 6  ? calcium-vitamin D (OSCAL WITH D) 500-200 MG-UNIT tablet Take 1 tablet by mouth 3 (three) times daily. 90 tablet 6  ? cyclobenzaprine (FLEXERIL) 10 MG tablet Take 10 mg by mouth 3 (three) times daily as needed for muscle spasms.    ? fluticasone (FLONASE) 50 MCG/ACT nasal spray Place 2 sprays into both nostrils daily. 16 g 6  ? Fluticasone-Umeclidin-Vilant (TRELEGY ELLIPTA) 200-62.5-25 MCG/ACT AEPB Inhale 1 puff into the lungs daily. 28 each 5  ? gabapentin (NEURONTIN) 300 MG capsule Take 1 capsule by mouth 3 (three) times daily.    ? levocetirizine (XYZAL) 5 MG tablet Take 1 tablet (5 mg total) by mouth every evening. 30 tablet 5  ? meloxicam (MOBIC) 7.5 MG tablet Take 1 tablet (7.5 mg total) by mouth 2 (two) times daily as needed for pain. 30 tablet 2  ? montelukast (SINGULAIR)  10 MG tablet TAKE 1 TABLET BY MOUTH EVERYDAY AT BEDTIME 30 tablet 5  ? morphine (MSIR) 15 MG tablet Take 15 mg by mouth every 4 (four) hours as needed for severe pain.    ? Olopatadine HCl 0.2 % SOLN Place 1 drop in each eye once a day as needed for itchy watery eyes 7.5 mL 5  ? pantoprazole (PROTONIX) 40 MG tablet Take 1 tablet (40 mg total) by mouth daily. 30 tablet 5  ? simvastatin (ZOCOR) 20 MG tablet TAKE 1 TABLET BY MOUTH EVERY DAY AT 6PM 90 tablet 1  ? triamcinolone (NASACORT) 55 MCG/ACT AERO nasal inhaler     ? zonisamide (ZONEGRAN) 100 MG capsule Take 1 capsule (100 mg total) by mouth at bedtime. 30 capsule 1  ?  albuterol (VENTOLIN HFA) 108 (90 Base) MCG/ACT inhaler TAKE 2 PUFFS BY MOUTH EVERY 4 TO 6 HOURS AS NEEDED (Patient not taking: Reported on 05/17/2021) 18 each 1  ? chlorpheniramine-HYDROcodone (TUSSIONEX PENNKINETIC ER) 10-8 MG/5ML Take 5 mLs by mouth at bedtime as needed for cough. (Patient not taking: Reported on 05/17/2021) 50 mL 0  ? ipratropium (ATROVENT) 0.03 % nasal spray Place 2 sprays in each nostril twice a day as needed for runny nose/drainage down throat (Patient not taking: Reported on 05/17/2021) 30 mL 5  ? ?Current Facility-Administered Medications  ?Medication Dose Route Frequency Provider Last Rate Last Admin  ? mepolizumab (NUCALA) injection 100 mg  100 mg Subcutaneous Q28 days Kennith Gain, MD   100 mg at 04/03/21 1136  ?  ? ?Known medication allergies: ?Allergies  ?Allergen Reactions  ? Other   ? Effexor [Venlafaxine] Other (See Comments)  ?  UNSPECIFIED REACTION, headaches, felt funny, withdrawal with missed dose   ? Lamictal [Lamotrigine] Rash  ? ? ? ?Physical examination: ?Blood pressure 124/80, pulse 88, temperature 97.9 ?F (36.6 ?C), resp. rate 18, height '5\' 1"'$  (1.549 m), weight 154 lb 4 oz (70 kg), SpO2 99 %. ? ?General: Alert, interactive, in no acute distress. ?HEENT: PERRLA, TMs pearly gray, turbinates markedly edematous with clear discharge, post-pharynx non erythematous. ?Neck: Supple without lymphadenopathy. ?Lungs: Clear to auscultation without wheezing, rhonchi or rales. {no increased work of breathing. ?CV: Normal S1, S2 without murmurs. ?Abdomen: Nondistended, nontender. ?Skin: Warm and dry, without lesions or rashes. ?Extremities:  No clubbing, cyanosis or edema. ?Neuro:   Grossly intact. ? ?Diagnositics/Labs: ?Component ?    Latest Ref Rng & Units 03/15/2021  ?Pneumo Ab Type 1* ?    >1.3 ug/mL 0.3 (L)  ?Pneumo Ab Type 3* ?    >1.3 ug/mL 0.2 (L)  ?Pneumo Ab Type 4* ?    >1.3 ug/mL 0.9 (L)  ?Pneumo Ab Type 8* ?    >1.3 ug/mL 5.5  ?Pneumo Ab Type 9 (9N)* ?    >1.3 ug/mL  0.4 (L)  ?Pneumo Ab Type 12 (45F)* ?    >1.3 ug/mL <0.1 (L)  ?Pneumo Ab Type 14* ?    >1.3 ug/mL 1.1 (L)  ?Pneumo Ab Type 17 (29F)* ?    >1.3 ug/mL 3.1  ?Pneumo Ab Type 19 (34F)* ?    >1.3 ug/mL 5.1  ?Pneumo Ab Type 2* ?    >1.3 ug/mL 6.1  ?Pneumo Ab Type 20* ?    >1.3 ug/mL 9.3  ?Pneumo Ab Type 22 (69F)* ?    >1.3 ug/mL 0.6 (L)  ?Pneumo Ab Type 23 (46F)* ?    >1.3 ug/mL 0.2 (L)  ?Pneumo Ab Type 26 (6B)* ?    >  1.3 ug/mL 1.1 (L)  ?Pneumo Ab Type 34 (10A)* ?    >1.3 ug/mL 1.5  ?Pneumo Ab Type 43 (11A)* ?    >1.3 ug/mL 0.2 (L)  ?Pneumo Ab Type 5* ?    >1.3 ug/mL <0.1 (L)  ?Pneumo Ab Type 51 (56F)* ?    >1.3 ug/mL 0.7 (L)  ?Pneumo Ab Type 54 (15B)* ?    >1.3 ug/mL 4.7  ?Pneumo Ab Type 56 (18C)* ?    >1.3 ug/mL 2.2  ?Pneumo Ab Type 57 (19A)* ?    >1.3 ug/mL 1.2 (L)  ?Pneumo Ab Type 68 (9V)* ?    >1.3 ug/mL 0.4 (L)  ?Pneumo Ab Type 70 (19F)* ?    >1.3 ug/mL 1.0 (L)  ? ? ?Assessment and plan: ?Allergic rhinitis with conjunctivitis (grass pollen, weed pollen, molds, and mouse) ?Continue Singulair 10 mg once a day ?Continue Xyzal 5 mg once a day as needed ?Continue Pataday 1 drop each eye once a day as needed for itchy watery eyes ?Continue ipratropium bromide nasal spray using 2 sprays each nostril twice a day as needed for runny nose/drainage down throat ?Continue fluticasone nasal spray 2 sprays each nostril once a day for stuffy nose. ?May use saline nasal rinse as needed for nasal symptoms. Use this prior to any medicated nasal spray. ? ?Moderate persistent asthma  ?Continue Trelegy 26mg 1 puff once a day.   ?We are in the process of getting Nucala monthly injections approved through the medication assistance program.  Tammy will notify you when you have been approved to receive monthly injections.  ?Continue Singulair 10 mg as above ?May use albuterol 2 puffs every 4 hours as needed for cough, wheeze, tightness in chest, or shortness of breath. Also, may use albuterol 2 puffs 5-15 minutes prior to  exercise ? ?Recurrent sinusitis  ?Strep pneumonia titers are better but she still does not have great protection.  She now has 35% protective rate up from 22% previously.   Let see how you do with infections and if you st

## 2021-05-22 ENCOUNTER — Telehealth: Payer: Self-pay | Admitting: *Deleted

## 2021-05-22 NOTE — Telephone Encounter (Signed)
Called patient and advised denial for PAP due to her havins LIS which is extra help with her meds. Will submit to [place] and reach out to patient once delivery set to make appt to start therapy ?

## 2021-05-23 ENCOUNTER — Other Ambulatory Visit (HOSPITAL_COMMUNITY): Payer: Self-pay | Admitting: Psychiatry

## 2021-05-23 ENCOUNTER — Other Ambulatory Visit: Payer: Self-pay | Admitting: Family

## 2021-05-23 ENCOUNTER — Other Ambulatory Visit: Payer: Self-pay | Admitting: Allergy

## 2021-05-23 DIAGNOSIS — J069 Acute upper respiratory infection, unspecified: Secondary | ICD-10-CM

## 2021-05-29 ENCOUNTER — Ambulatory Visit: Payer: Medicare HMO

## 2021-05-31 ENCOUNTER — Ambulatory Visit (INDEPENDENT_AMBULATORY_CARE_PROVIDER_SITE_OTHER): Payer: Medicare HMO

## 2021-05-31 DIAGNOSIS — J4550 Severe persistent asthma, uncomplicated: Secondary | ICD-10-CM | POA: Diagnosis not present

## 2021-06-03 ENCOUNTER — Telehealth (HOSPITAL_BASED_OUTPATIENT_CLINIC_OR_DEPARTMENT_OTHER): Payer: Medicare HMO | Admitting: Psychiatry

## 2021-06-03 ENCOUNTER — Encounter (HOSPITAL_COMMUNITY): Payer: Self-pay | Admitting: Psychiatry

## 2021-06-03 DIAGNOSIS — F3175 Bipolar disorder, in partial remission, most recent episode depressed: Secondary | ICD-10-CM

## 2021-06-03 MED ORDER — BUPROPION HCL ER (XL) 300 MG PO TB24: 300.0000 mg | ORAL_TABLET | Freq: Every day | ORAL | 1 refills | Status: DC

## 2021-06-03 MED ORDER — ZONISAMIDE 100 MG PO CAPS: 100.0000 mg | ORAL_CAPSULE | Freq: Every day | ORAL | 1 refills | Status: DC

## 2021-06-03 NOTE — Progress Notes (Signed)
BH MD/PA/NP OP Progress Note ? ?06/03/2021 11:38 AM ?[name]  ?MRN:  [id number] ? ?Chief Complaint:   medication management ? ?This was a phone based visit . Limitations associated with this type of communication have been reviewed and patient's identity has been confirmed with two separate identifiers . ?Location of parties  ?Patient - home ?Clinician- [facility] outpatient clinic  ?Duration 20 minutes  ?HPI: Ms . Klapper is a 56 year old female . ?She was seeing Dr.Pucilowski for outpatient psychiatric management . He has retired ( last seen by Dr Mamie Nick. On 03/15/2020 ) .  ? ? ?56 year old single female, lives alone, on disability.  ? ?Patient reports she has generally been doing well.  She does state that this time of year is challenging due to allergies.  She reports she has seasonal allergies which worsen in the springtime and contribute to respiratory symptoms/asthma.  She is following up with allergist and reports currently on mepolizumab management with some improvement. ? ?With regards to mood reports she is doing "all right".  Denies suicidal ideations.  Does not endorse anhedonia.  Endorses some decreased and energy level related to above medical issues. ? ?She reports she is tolerating current medication regimen well.  She feels medications are helping and well-tolerated. ? ?(Remote history of AUD.  Reports she has been sober for years and denies any relapses or cravings) ? ? ? ? ? ? ? ?Visit Diagnosis: Bipolar Disorder II by history  ? ?Past Psychiatric History: Reports history of Bipolar Disorder diagnosis. Chart notes also indicate history of Borderline Personality Disorder .  ?Past history of Alcohol Use Disorder , reports has been sober /abstinent for several years .  ? ?Past Medical History:  ?Past Medical History:  ?Diagnosis Date  ? Anxiety   ? Arthritis   ? neck, knees, shoulders  ? Asthma   ? Bipolar disorder ([place])   ? currently feeling MANIC- 10/02/2016  ? Depression   ? GERD (gastroesophageal reflux  disease)   ? Hip fracture ([place]) 06/2019  ? History of blood transfusion   ? as a newborn   ? History of lump of left breast   ? Hyperlipidemia   ? Lumbar pseudoarthrosis   ? Motion sickness   ? cars  ? OSA (obstructive sleep apnea) 01/09/2016  ? can't afford CPAP  ? Personality disorder ([place])   ? PONV (postoperative nausea and vomiting)   ? Post traumatic stress disorder (PTSD)   ? Substance abuse ([place])   ? Synovial cyst   ?  ?Past Surgical History:  ?Procedure Laterality Date  ? COLONOSCOPY WITH PROPOFOL N/A 11/04/2017  ? Procedure: COLONOSCOPY WITH PROPOFOL;  Surgeon: Lin Landsman, MD;  Location: [place];  Service: Endoscopy;  Laterality: N/A;  ? ESOPHAGOGASTRODUODENOSCOPY (EGD) WITH PROPOFOL N/A 11/04/2017  ? Procedure: ESOPHAGOGASTRODUODENOSCOPY (EGD) WITH PROPOFOL with biopsies;  Surgeon: Lin Landsman, MD;  Location: [place];  Service: Endoscopy;  Laterality: N/A;  sleep apnea  ? KNEE SURGERY Left   ? x5, post basketball injury  ? LUMBAR LAMINECTOMY/DECOMPRESSION MICRODISCECTOMY Left 01/22/2016  ? Procedure: Laminectomy for facet/synovial cyst - left - Lumbar four - lumbar five;  Surgeon: Earnie Larsson, MD;  Location: [place];  Service: Neurosurgery;  Laterality: Left;  Laminectomy for facet/synovial cyst - left - Lumbar four - lumbar five  ? POLYPECTOMY N/A 11/04/2017  ? Procedure: POLYPECTOMY INTESTINAL;  Surgeon: Lin Landsman, MD;  Location: [place];  Service: Endoscopy;  Laterality: N/A;  ?  SHOULDER SURGERY Left   ? x2  ? SPINE SURGERY N/A   ? [employer] 08/07/2019  ? TOE SURGERY Bilateral   ? bone spurs  ? ? ?Family Psychiatric History: As below  ? ?Family History:  ?Family History  ?Problem Relation Age of Onset  ? Heart disease Father   ? Hyperlipidemia Father   ? Alcohol abuse Brother   ? Alcohol abuse Paternal Uncle   ? Breast cancer Maternal Aunt   ?     88's  ? Colon cancer Neg Hx   ? ? ?Social History: Single,no children,  lives alone, on disability,  focuses on taking care of her elderly parents  ?Social History  ? ?Socioeconomic History  ? Marital status: Single  ?  Spouse name: Not on file  ? Number of children: 0  ? Years of education: Not on file  ? Highest education level: Bachelor's degree (e.g., BA, AB, BS)  ?Occupational History  ? Not on file  ?Tobacco Use  ? Smoking status: Former  ?  Packs/day: 1.00  ?  Years: 1.00  ?  Pack years: 1.00  ?  Types: Cigarettes  ?  Quit date: 04/15/1996  ?  Years since quitting: 25.1  ? Smokeless tobacco: Never  ? Tobacco comments:  ?  started back again in 2016, smoked for about 6 mo and then quit  ?Vaping Use  ? Vaping Use: Never used  ?Substance and Sexual Activity  ? Alcohol use: No  ?  Alcohol/week: 0.0 standard drinks  ?  Comment: quit 20 years  ? Drug use: Yes  ?  Frequency: 14.0 times per week  ?  Types: Marijuana  ?  Comment: 20 yrs. ago- cocaine   ? Sexual activity: Not Currently  ?Other Topics Concern  ? Not on file  ?Social History Narrative  ? Not on file  ? ?Social Determinants of Health  ? ?Financial Resource Strain: Not on file  ?Food Insecurity: Not on file  ?Transportation Needs: Not on file  ?Physical Activity: Not on file  ?Stress: Not on file  ?Social Connections: Not on file  ? ? ?Allergies:  ?Allergies  ?Allergen Reactions  ? Other   ? Effexor [Venlafaxine] Other (See Comments)  ?  UNSPECIFIED REACTION, headaches, felt funny, withdrawal with missed dose   ? Lamictal [Lamotrigine] Rash  ? ? ?Metabolic Disorder Labs: ?Lab Results  ?Component Value Date  ? HGBA1C 5.6 06/22/2018  ? ?No results found for: PROLACTIN ?Lab Results  ?Component Value Date  ? CHOL 168 08/10/2019  ? TRIG 103 08/10/2019  ? HDL 71 08/10/2019  ? CHOLHDL 2.4 08/10/2019  ? VLDL 22.2 06/22/2018  ? [place] 79 08/10/2019  ? [place] 79 06/22/2018  ? ?Lab Results  ?Component Value Date  ? TSH 1.310 04/05/2020  ? TSH 1.30 03/15/2015  ? ? ?Therapeutic Level Labs: ?No results found for: LITHIUM ?No results found for: VALPROATE ?No components  found for:  CBMZ ? ?Current Medications: ?Current Outpatient Medications  ?Medication Sig Dispense Refill  ? albuterol (VENTOLIN HFA) 108 (90 Base) MCG/ACT inhaler TAKE 2 PUFFS BY MOUTH EVERY 4 TO 6 HOURS AS NEEDED (Patient not taking: Reported on 05/17/2021) 18 each 1  ? aspirin 81 MG tablet Take 81 mg by mouth daily.    ? Azelastine HCl 137 MCG/SPRAY SOLN PLACE 2 SPRAYS INTO BOTH NOSTRILS 2 (TWO) TIMES DAILY 30 mL 3  ? buPROPion (WELLBUTRIN XL) 300 MG 24 hr tablet Take 1 tablet (300 mg total) by mouth daily.  30 tablet 1  ? Calcium Carb-Cholecalciferol (OYSTER SHELL CALCIUM W/D) 500-5 MG-MCG TABS TAKE 1 TABLET BY MOUTH THREE TIMES A DAY 90 tablet 6  ? calcium-vitamin D (OSCAL WITH D) 500-200 MG-UNIT tablet Take 1 tablet by mouth 3 (three) times daily. 90 tablet 6  ? chlorpheniramine-HYDROcodone (TUSSIONEX PENNKINETIC ER) 10-8 MG/5ML Take 5 mLs by mouth at bedtime as needed for cough. (Patient not taking: Reported on 05/17/2021) 50 mL 0  ? fluticasone (FLONASE) 50 MCG/ACT nasal spray Place 2 sprays into both nostrils daily. 16 g 6  ? Fluticasone-Umeclidin-Vilant (TRELEGY ELLIPTA) 200-62.5-25 MCG/ACT AEPB Inhale 1 puff into the lungs daily. 28 each 5  ? gabapentin (NEURONTIN) 300 MG capsule Take 1 capsule by mouth 3 (three) times daily.    ? ipratropium (ATROVENT) 0.03 % nasal spray Place 2 sprays in each nostril twice a day as needed for runny nose/drainage down throat (Patient not taking: Reported on 05/17/2021) 30 mL 5  ? levocetirizine (XYZAL) 5 MG tablet Take 1 tablet (5 mg total) by mouth every evening. 30 tablet 5  ? meloxicam (MOBIC) 7.5 MG tablet Take 1 tablet (7.5 mg total) by mouth 2 (two) times daily as needed for pain. 30 tablet 2  ? montelukast (SINGULAIR) 10 MG tablet TAKE 1 TABLET BY MOUTH EVERYDAY AT BEDTIME 30 tablet 5  ? morphine (MSIR) 15 MG tablet Take 15 mg by mouth every 4 (four) hours as needed for severe pain.    ? Olopatadine HCl 0.2 % SOLN Place 1 drop in each eye once a day as needed for  itchy watery eyes 7.5 mL 5  ? pantoprazole (PROTONIX) 40 MG tablet Take 1 tablet (40 mg total) by mouth daily. 30 tablet 5  ? simvastatin (ZOCOR) 20 MG tablet TAKE 1 TABLET BY MOUTH EVERY DAY AT 6PM 90 tablet 1  ?

## 2021-06-21 ENCOUNTER — Other Ambulatory Visit: Payer: Self-pay | Admitting: Family

## 2021-06-21 DIAGNOSIS — J069 Acute upper respiratory infection, unspecified: Secondary | ICD-10-CM

## 2021-06-22 NOTE — Telephone Encounter (Signed)
Do not refill. At her last office visit with Dr. Nelva Bush on 05/17/21 she was on Trelegy 200 mcg 1 puff once a day.

## 2021-06-28 ENCOUNTER — Ambulatory Visit (INDEPENDENT_AMBULATORY_CARE_PROVIDER_SITE_OTHER): Payer: Medicare HMO | Admitting: *Deleted

## 2021-06-28 DIAGNOSIS — J4550 Severe persistent asthma, uncomplicated: Secondary | ICD-10-CM

## 2021-07-02 ENCOUNTER — Encounter (HOSPITAL_COMMUNITY): Payer: Self-pay | Admitting: Psychiatry

## 2021-07-02 ENCOUNTER — Ambulatory Visit (HOSPITAL_BASED_OUTPATIENT_CLINIC_OR_DEPARTMENT_OTHER): Payer: Medicare HMO | Admitting: Psychiatry

## 2021-07-02 DIAGNOSIS — F3175 Bipolar disorder, in partial remission, most recent episode depressed: Secondary | ICD-10-CM | POA: Diagnosis not present

## 2021-07-02 DIAGNOSIS — F1021 Alcohol dependence, in remission: Secondary | ICD-10-CM | POA: Diagnosis not present

## 2021-07-02 MED ORDER — ZONISAMIDE 100 MG PO CAPS: 100.0000 mg | ORAL_CAPSULE | Freq: Every day | ORAL | 1 refills | Status: DC

## 2021-07-02 MED ORDER — BUPROPION HCL ER (XL) 300 MG PO TB24: 300.0000 mg | ORAL_TABLET | Freq: Every day | ORAL | 1 refills | Status: DC
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
## 2021-07-08 ENCOUNTER — Other Ambulatory Visit: Payer: Self-pay | Admitting: Family Medicine

## 2021-07-16 ENCOUNTER — Other Ambulatory Visit: Payer: Self-pay | Admitting: Family Medicine

## 2021-07-16 ENCOUNTER — Other Ambulatory Visit: Payer: Self-pay | Admitting: Nurse Practitioner

## 2021-07-16 DIAGNOSIS — Z1231 Encounter for screening mammogram for malignant neoplasm of breast: Secondary | ICD-10-CM

## 2021-07-25 ENCOUNTER — Ambulatory Visit
Admission: RE | Admit: 2021-07-25 | Discharge: 2021-07-25 | Disposition: A | Discharge status: Home / Self Care | Payer: Medicare HMO | Source: Ambulatory Visit | Attending: Family Medicine | Admitting: Family Medicine

## 2021-07-25 DIAGNOSIS — Z1231 Encounter for screening mammogram for malignant neoplasm of breast: Secondary | ICD-10-CM

## 2021-07-26 ENCOUNTER — Ambulatory Visit (INDEPENDENT_AMBULATORY_CARE_PROVIDER_SITE_OTHER): Payer: Medicare HMO

## 2021-07-26 DIAGNOSIS — J4550 Severe persistent asthma, uncomplicated: Secondary | ICD-10-CM

## 2021-07-31 DIAGNOSIS — S32010A Wedge compression fracture of first lumbar vertebra, initial encounter for closed fracture: Secondary | ICD-10-CM | POA: Diagnosis not present

## 2021-07-31 DIAGNOSIS — Z6828 Body mass index (BMI) 28.0-28.9, adult: Secondary | ICD-10-CM | POA: Diagnosis not present

## 2021-08-01 ENCOUNTER — Other Ambulatory Visit: Payer: Self-pay | Admitting: Neurosurgery

## 2021-08-01 DIAGNOSIS — S32010A Wedge compression fracture of first lumbar vertebra, initial encounter for closed fracture: Secondary | ICD-10-CM

## 2021-08-01 DIAGNOSIS — R69 Illness, unspecified: Secondary | ICD-10-CM | POA: Diagnosis not present

## 2021-08-08 ENCOUNTER — Other Ambulatory Visit: Payer: Self-pay | Admitting: Allergy

## 2021-08-13 ENCOUNTER — Other Ambulatory Visit: Payer: Self-pay | Admitting: Allergy

## 2021-08-23 ENCOUNTER — Ambulatory Visit (INDEPENDENT_AMBULATORY_CARE_PROVIDER_SITE_OTHER): Payer: Medicare HMO

## 2021-08-23 DIAGNOSIS — J4550 Severe persistent asthma, uncomplicated: Secondary | ICD-10-CM

## 2021-09-03 ENCOUNTER — Other Ambulatory Visit: Payer: Self-pay | Admitting: Allergy

## 2021-09-08 ENCOUNTER — Other Ambulatory Visit: Payer: Self-pay | Admitting: Allergy

## 2021-09-09 ENCOUNTER — Telehealth (HOSPITAL_COMMUNITY): Payer: Self-pay | Admitting: Psychiatry

## 2021-09-09 ENCOUNTER — Ambulatory Visit (HOSPITAL_COMMUNITY): Payer: Medicare HMO | Admitting: Psychiatry

## 2021-09-09 ENCOUNTER — Encounter (HOSPITAL_COMMUNITY): Payer: Self-pay | Admitting: Psychiatry

## 2021-09-09 VITALS — BP 142/92 | HR 74 | Temp 97.8°F | Ht 61.0 in | Wt 142.8 lb

## 2021-09-09 DIAGNOSIS — F3175 Bipolar disorder, in partial remission, most recent episode depressed: Secondary | ICD-10-CM

## 2021-09-09 MED ORDER — ZONISAMIDE 100 MG PO CAPS
100.0000 mg | ORAL_CAPSULE | Freq: Every day | ORAL | 1 refills | Status: DC
Start: 2021-09-09 — End: 2021-10-07

## 2021-09-09 MED ORDER — BUPROPION HCL ER (XL) 300 MG PO TB24: 300.0000 mg | ORAL_TABLET | Freq: Every day | ORAL | 1 refills | Status: DC

## 2021-09-09 NOTE — Telephone Encounter (Signed)
D:  Dr. Parke Poisson referred pt to [facility].  A:  Met with pt after she had her visit with Dr. Parke Poisson.  Oriented pt.  Pt states she would need to discuss with her dad first because of her schedule.  "I help take care of my mother with my Dad and I would hate to start and miss a lot of days b/c I never know when they will need me."  Encouraged pt to contact the case manager after she speaks to her father and whenever she's ready to start [place].  Informed Dr. Parke Poisson.
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
## 2021-09-17 ENCOUNTER — Encounter: Payer: Self-pay | Admitting: Family Medicine

## 2021-09-17 ENCOUNTER — Ambulatory Visit (INDEPENDENT_AMBULATORY_CARE_PROVIDER_SITE_OTHER): Payer: Medicare HMO | Admitting: Family Medicine

## 2021-09-17 VITALS — BP 117/77 | HR 79 | Temp 98.0°F | Resp 16 | Ht 61.0 in | Wt 145.6 lb

## 2021-09-17 DIAGNOSIS — Z13228 Encounter for screening for other metabolic disorders: Secondary | ICD-10-CM | POA: Diagnosis not present

## 2021-09-17 DIAGNOSIS — Z130 Encounter for screening for diseases of the blood and blood-forming organs and certain disorders involving the immune mechanism: Secondary | ICD-10-CM

## 2021-09-17 DIAGNOSIS — Z1211 Encounter for screening for malignant neoplasm of colon: Secondary | ICD-10-CM | POA: Diagnosis not present

## 2021-09-17 DIAGNOSIS — Z23 Encounter for immunization: Secondary | ICD-10-CM

## 2021-09-17 DIAGNOSIS — Z13220 Encounter for screening for lipoid disorders: Secondary | ICD-10-CM | POA: Diagnosis not present

## 2021-09-17 DIAGNOSIS — Z0000 Encounter for general adult medical examination without abnormal findings: Secondary | ICD-10-CM | POA: Diagnosis not present

## 2021-09-17 DIAGNOSIS — Z1329 Encounter for screening for other suspected endocrine disorder: Secondary | ICD-10-CM | POA: Diagnosis not present

## 2021-09-17 NOTE — Progress Notes (Unsigned)
Patient is  here for complete physical examination Patient has no other concerns for provider today.

## 2021-09-18 ENCOUNTER — Encounter: Payer: Self-pay | Admitting: Family Medicine

## 2021-09-18 LAB — CMP14+EGFR
ALT: 17 IU/L (ref 0–32)
AST: 17 IU/L (ref 0–40)
Albumin/Globulin Ratio: 1.9 (ref 1.2–2.2)
Albumin: 4.7 g/dL (ref 3.8–4.9)
Alkaline Phosphatase: 93 IU/L (ref 44–121)
BUN/Creatinine Ratio: 9 (ref 9–23)
BUN: 8 mg/dL (ref 6–24)
Bilirubin Total: <0.2 mg/dL (ref 0.0–1.2)
CO2: 24 mmol/L (ref 20–29)
Calcium: 9.6 mg/dL (ref 8.7–10.2)
Chloride: 100 mmol/L (ref 96–106)
Creatinine, Ser: 0.87 mg/dL (ref 0.57–1.00)
Globulin, Total: 2.5 g/dL (ref 1.5–4.5)
Glucose: 81 mg/dL (ref 70–99)
Potassium: 5.1 mmol/L (ref 3.5–5.2)
Sodium: 139 mmol/L (ref 134–144)
Total Protein: 7.2 g/dL (ref 6.0–8.5)
eGFR: 78 mL/min/{1.73_m2} (ref >59)

## 2021-09-18 LAB — CBC WITH DIFFERENTIAL/PLATELET
Basophils Absolute: 0 10*3/uL (ref 0.0–0.2)
Basos: 0 %
EOS (ABSOLUTE): 0.1 10*3/uL (ref 0.0–0.4)
Eos: 1 %
Hematocrit: 42.1 % (ref 34.0–46.6)
Hemoglobin: 14.3 g/dL (ref 11.1–15.9)
Immature Grans (Abs): 0 10*3/uL (ref 0.0–0.1)
Immature Granulocytes: 0 %
Lymphocytes Absolute: 3 10*3/uL (ref 0.7–3.1)
Lymphs: 42 %
MCH: 30.4 pg (ref 26.6–33.0)
MCHC: 34 g/dL (ref 31.5–35.7)
MCV: 90 fL (ref 79–97)
Monocytes Absolute: 0.4 10*3/uL (ref 0.1–0.9)
Monocytes: 6 %
Neutrophils Absolute: 3.7 10*3/uL (ref 1.4–7.0)
Neutrophils: 51 %
Platelets: 403 10*3/uL (ref 150–450)
RBC: 4.7 x10E6/uL (ref 3.77–5.28)
RDW: 13.5 % (ref 11.7–15.4)
WBC: 7.3 10*3/uL (ref 3.4–10.8)

## 2021-09-18 LAB — HEMOGLOBIN A1C
Est. average glucose Bld gHb Est-mCnc: 114 mg/dL
Hgb A1c MFr Bld: 5.6 % (ref 4.8–5.6)

## 2021-09-18 LAB — LIPID PANEL
Chol/HDL Ratio: 2.7 ratio (ref 0.0–4.4)
Cholesterol, Total: 188 mg/dL (ref 100–199)
HDL: 69 mg/dL (ref >39)
LDL Chol Calc (NIH): 90 mg/dL (ref 0–99)
Triglycerides: 169 mg/dL — ABNORMAL HIGH (ref 0–149)
VLDL Cholesterol Cal: 29 mg/dL (ref 5–40)

## 2021-09-18 LAB — VITAMIN D 25 HYDROXY (VIT D DEFICIENCY, FRACTURES): Vit D, 25-Hydroxy: 35 ng/mL (ref 30.0–100.0)

## 2021-09-18 LAB — TSH: TSH: 1.48 u[IU]/mL (ref 0.450–4.500)

## 2021-09-18 NOTE — Progress Notes (Signed)
Established Patient Office Visit  Subjective    Patient ID: [name], female    DOB: Dec 13, 1965  Age: 56 y.o. MRN: [id number]  CC: No chief complaint on file.   HPI [name] presents for routine annual exam. Patient denies acute complaints or concerns.    Outpatient Encounter Medications as of 09/17/2021  Medication Sig   aspirin 81 MG tablet Take 81 mg by mouth daily.   Azelastine HCl 137 MCG/SPRAY SOLN PLACE 2 SPRAYS INTO BOTH NOSTRILS 2 (TWO) TIMES DAILY   buPROPion (WELLBUTRIN XL) 300 MG 24 hr tablet Take 1 tablet (300 mg total) by mouth daily.   Calcium Carb-Cholecalciferol (OYSTER SHELL CALCIUM W/D) 500-5 MG-MCG TABS TAKE 1 TABLET BY MOUTH THREE TIMES A DAY   calcium-vitamin D (OSCAL WITH D) 500-200 MG-UNIT tablet Take 1 tablet by mouth 3 (three) times daily.   fluticasone (FLONASE) 50 MCG/ACT nasal spray Place 2 sprays into both nostrils daily.   Fluticasone-Umeclidin-Vilant (TRELEGY ELLIPTA) 200-62.5-25 MCG/ACT AEPB Inhale 1 puff into the lungs daily.   gabapentin (NEURONTIN) 300 MG capsule Take 1 capsule by mouth 3 (three) times daily.   levocetirizine (XYZAL) 5 MG tablet TAKE 1 TABLET BY MOUTH EVERY DAY IN THE EVENING   meloxicam (MOBIC) 7.5 MG tablet Take 1 tablet (7.5 mg total) by mouth 2 (two) times daily as needed for pain.   montelukast (SINGULAIR) 10 MG tablet TAKE 1 TABLET BY MOUTH EVERYDAY AT BEDTIME   morphine (MSIR) 15 MG tablet Take 15 mg by mouth every 4 (four) hours as needed for severe pain.   Olopatadine HCl 0.2 % SOLN Place 1 drop in each eye once a day as needed for itchy watery eyes   pantoprazole (PROTONIX) 40 MG tablet TAKE 1 TABLET BY MOUTH EVERY DAY   simvastatin (ZOCOR) 20 MG tablet TAKE 1 TABLET BY MOUTH EVERY DAY AT 6PM   triamcinolone (NASACORT) 55 MCG/ACT AERO nasal inhaler    zonisamide (ZONEGRAN) 100 MG capsule Take 1 capsule (100 mg total) by mouth at bedtime.   albuterol (VENTOLIN HFA) 108 (90 Base) MCG/ACT inhaler TAKE 2 PUFFS BY  MOUTH EVERY 4 TO 6 HOURS AS NEEDED (Patient not taking: Reported on 05/17/2021)   chlorpheniramine-HYDROcodone (TUSSIONEX PENNKINETIC ER) 10-8 MG/5ML Take 5 mLs by mouth at bedtime as needed for cough. (Patient not taking: Reported on 05/17/2021)   ipratropium (ATROVENT) 0.03 % nasal spray Place 2 sprays in each nostril twice a day as needed for runny nose/drainage down throat (Patient not taking: Reported on 05/17/2021)   Facility-Administered Encounter Medications as of 09/17/2021  Medication   mepolizumab (NUCALA) injection 100 mg    Past Medical History:  Diagnosis Date   Anxiety    Arthritis    neck, knees, shoulders   Asthma    Bipolar disorder ([place])    currently feeling MANIC- 10/02/2016   Depression    GERD (gastroesophageal reflux disease)    Hip fracture ([place]) 06/2019   History of blood transfusion    as a newborn    History of lump of left breast    Hyperlipidemia    Lumbar pseudoarthrosis    Motion sickness    cars   OSA (obstructive sleep apnea) 01/09/2016   can't afford CPAP   Personality disorder (HCC)    PONV (postoperative nausea and vomiting)    Post traumatic stress disorder (PTSD)    Substance abuse (HCC)    Synovial cyst     Past Surgical History:  Procedure Laterality  Date   COLONOSCOPY WITH PROPOFOL N/A 11/04/2017   Procedure: COLONOSCOPY WITH PROPOFOL;  Surgeon: Lin Landsman, MD;  Location: [place];  Service: Endoscopy;  Laterality: N/A;   ESOPHAGOGASTRODUODENOSCOPY (EGD) WITH PROPOFOL N/A 11/04/2017   Procedure: ESOPHAGOGASTRODUODENOSCOPY (EGD) WITH PROPOFOL with biopsies;  Surgeon: Lin Landsman, MD;  Location: [place];  Service: Endoscopy;  Laterality: N/A;  sleep apnea   KNEE SURGERY Left    x5, post basketball injury   LUMBAR LAMINECTOMY/DECOMPRESSION MICRODISCECTOMY Left 01/22/2016   Procedure: Laminectomy for facet/synovial cyst - left - Lumbar four - lumbar five;  Surgeon: Earnie Larsson, MD;  Location: [place];   Service: Neurosurgery;  Laterality: Left;  Laminectomy for facet/synovial cyst - left - Lumbar four - lumbar five   POLYPECTOMY N/A 11/04/2017   Procedure: POLYPECTOMY INTESTINAL;  Surgeon: Lin Landsman, MD;  Location: [place];  Service: Endoscopy;  Laterality: N/A;   SHOULDER SURGERY Left    x2   SPINE SURGERY N/A    Phreesia 08/07/2019   TOE SURGERY Bilateral    bone spurs    Family History  Problem Relation Age of Onset   Heart disease Father    Hyperlipidemia Father    Alcohol abuse Brother    Alcohol abuse Paternal Uncle    Breast cancer Maternal Aunt        70's   Colon cancer Neg Hx     Social History   Socioeconomic History   Marital status: Single    Spouse name: Not on file   Number of children: 0   Years of education: Not on file   Highest education level: Bachelor's degree (e.g., BA, AB, BS)  Occupational History   Not on file  Tobacco Use   Smoking status: Former    Packs/day: 1.00    Years: 1.00    Total pack years: 1.00    Types: Cigarettes    Quit date: 04/15/1996    Years since quitting: 25.4   Smokeless tobacco: Never   Tobacco comments:    started back again in 2016, smoked for about 6 mo and then quit  Vaping Use   Vaping Use: Never used  Substance and Sexual Activity   Alcohol use: No    Alcohol/week: 0.0 standard drinks of alcohol    Comment: quit 20 years   Drug use: Yes    Frequency: 14.0 times per week    Types: Marijuana    Comment: 20 yrs. ago- cocaine    Sexual activity: Not Currently  Other Topics Concern   Not on file  Social History Narrative   Not on file   Social Determinants of Health   Financial Resource Strain: Not on file  Food Insecurity: Food Insecurity Present (04/22/2018)   Hunger Vital Sign    Worried About Running Out of Food in the Last Year: Sometimes true    Ran Out of Food in the Last Year: Not on file  Transportation Needs: No Transportation Needs (04/22/2018)   PRAPARE - Armed forces logistics/support/administrative officer (Medical): No    Lack of Transportation (Non-Medical): No  Physical Activity: Inactive (04/22/2018)   Exercise Vital Sign    Days of Exercise per Week: 0 days    Minutes of Exercise per Session: 0 min  Stress: Stress Concern Present (04/22/2018)   [place]    Feeling of Stress : Very much  Social Connections: Unknown (04/22/2018)   Social  Connection and Isolation Panel [NHANES]    Frequency of Communication with Friends and Family: Not on file    Frequency of Social Gatherings with Friends and Family: Not on file    Attends Religious Services: 1 to 4 times per year    Active Member of [employer] or Organizations: Not on file    Attends Archivist Meetings: Not on file    Marital Status: Never married  Intimate Partner Violence: Not on file    Review of Systems  All other systems reviewed and are negative.       Objective    BP 117/77   Pulse 79   Temp 98 F (36.7 C) (Oral)   Resp 16   Ht _0  (1.549 m)   Wt 145 lb 9.6 oz (66 kg)   SpO2 98%   BMI 27.51 kg/m   Physical Exam Vitals and nursing note reviewed.  Constitutional:      General: She is not in acute distress. HENT:     Head: Normocephalic and atraumatic.     Right Ear: Tympanic membrane, ear canal and external ear normal.     Left Ear: Tympanic membrane, ear canal and external ear normal.     Nose: Nose normal.     Mouth/Throat:     Mouth: Mucous membranes are moist.     Pharynx: Oropharynx is clear.  Eyes:     Conjunctiva/sclera: Conjunctivae normal.     Pupils: Pupils are equal, round, and reactive to light.  Neck:     Thyroid: No thyromegaly.  Cardiovascular:     Rate and Rhythm: Normal rate and regular rhythm.     Heart sounds: Normal heart sounds. No murmur heard. Pulmonary:     Effort: Pulmonary effort is normal. No respiratory distress.     Breath sounds: Normal breath sounds.  Abdominal:      General: There is no distension.     Palpations: Abdomen is soft. There is no mass.     Tenderness: There is no abdominal tenderness.  Musculoskeletal:        General: Normal range of motion.     Cervical back: Normal range of motion and neck supple.  Skin:    General: Skin is warm and dry.  Neurological:     General: No focal deficit present.     Mental Status: She is alert and oriented to person, place, and time.  Psychiatric:        Mood and Affect: Mood normal.        Behavior: Behavior normal.         Assessment & Plan:   1. Annual physical exam  - CMP14+EGFR  2. Screening for deficiency anemia  - CBC with Differential  3. Screening for lipid disorders  - Lipid Panel  4. Screening for endocrine/metabolic/immunity disorders  - TSH - Hemoglobin A1c - Vitamin D, 25-hydroxy  5. Screening for colon cancer  - Ambulatory referral to Gastroenterology - Vitamin D, 25-hydroxy  6. Need for shingles vaccine     No follow-ups on file.   Becky Sax, MD

## 2021-09-20 ENCOUNTER — Ambulatory Visit (INDEPENDENT_AMBULATORY_CARE_PROVIDER_SITE_OTHER): Payer: Medicare HMO

## 2021-09-20 DIAGNOSIS — J4550 Severe persistent asthma, uncomplicated: Secondary | ICD-10-CM

## 2021-09-28 ENCOUNTER — Other Ambulatory Visit: Payer: Self-pay | Admitting: Allergy

## 2021-10-01 ENCOUNTER — Other Ambulatory Visit: Payer: Self-pay | Admitting: Allergy

## 2021-10-07 ENCOUNTER — Encounter (HOSPITAL_COMMUNITY): Payer: Self-pay | Admitting: Psychiatry

## 2021-10-07 ENCOUNTER — Telehealth (HOSPITAL_BASED_OUTPATIENT_CLINIC_OR_DEPARTMENT_OTHER): Payer: Medicare HMO | Admitting: Psychiatry

## 2021-10-07 DIAGNOSIS — F324 Major depressive disorder, single episode, in partial remission: Secondary | ICD-10-CM

## 2021-10-07 MED ORDER — BUPROPION HCL ER (XL) 300 MG PO TB24
300.0000 mg | ORAL_TABLET | Freq: Every day | ORAL | 1 refills | Status: DC
Start: 2021-10-07 — End: 2021-12-04

## 2021-10-07 MED ORDER — ZONISAMIDE 100 MG PO CAPS: 100.0000 mg | ORAL_CAPSULE | Freq: Every day | ORAL | 1 refills | Status: DC

## 2021-10-07 NOTE — Progress Notes (Signed)
BH MD/PA/NP OP Progress Note  10/07/2021 8:41 AM Toniesha Zellner  MRN:  [id number]  Chief Complaint:   medication management  This was a phone based appointment . Limitations associated with this type of communication have been reviewed. Patient's identity has been verified by two different identifiers . Location of parties  MD- [facility] outpatient clinic Patient - home  Duration 63mnutes   HPI: Ms . BSweetenis a 56year old female . She was seeing Dr.Pucilowski for outpatient psychiatric management . He has retired ( last seen by Dr PMamie Nick On 03/15/2020 ) .    56year old single female, lives alone, on disability.   Ms. BBurnsidereports she has been doing well.  She denies depression and currently describes her mood as stable and improved.  She denies/does not endorse any hypomanic or manic type symptoms.  She reports she is functioning well in her daily activities: she enjoys spending time with her pet dog.  She lives alone but is geographically close to her elderly parents whom she visits regularly/often.  She reports that her mother's health has been declining and has needed more support from her for daily activities .  She also notes that her mother is her identified payee ( for disability monies ) but due to her failing health would like for payee to be transferred to either herself or to another family member.   She has a history of chronic asthma for which she is following with an allergist.  TSusa Lofflermonthly.  Currently reports asthma as improved/better controlled.  Remote history of AUD . Has been abstinent for many years .    As above, describes her mood as stable.  She reports current medication regimen has been helpful and effective.  She has taken Zonegran 100 mg daily for mood disorder for 2 + years .  She is aware that this is off label medication for mood disorder.  She reports "it has really helped me" and reports has been well-tolerated.  She is also on Wellbutrin XL 300 mg daily.   Denies side effects and reports good response.  Denies any history of seizures.    Meds reviewed- from other providers is on Neurontin and also reports she intermittnetly takes MSIR for pain . Denies any abuse or misuse . We reviewed potential for sedation associated with these medications .  Denies sedation or other side effects .   Medication side effects reviewed.  Labs reviewed (09/17/2021)-unremarkable.               Visit Diagnosis: Bipolar Disorder II by history   Past Psychiatric History: Reports history of Bipolar Disorder diagnosis. Chart notes also indicate history of Borderline Personality Disorder .  Past history of Alcohol Use Disorder , reports has been sober /abstinent for several years .   Past Medical History:  Past Medical History:  Diagnosis Date   Anxiety    Arthritis    neck, knees, shoulders   Asthma    B[place] disorder ([place]    currently feeling MANIC- 10/02/2016   Depression    GERD (gastroesophageal reflux disease)    [place]   History of blood transfusion    as a newborn    History of lump of left breast    Hyperlipidemia    Lumbar pseudoarthrosis    Motion sickness    cars   OSA (obstructive sleep apnea) 01/09/2016   can't afford CPAP   Personality disorder (HCC)    PONV (  postoperative nausea and vomiting)    Post traumatic stress disorder (PTSD)    Substance abuse (HCC)    Synovial cyst     Past Surgical History:  Procedure Laterality Date   COLONOSCOPY WITH PROPOFOL N/A 11/04/2017   Procedure: COLONOSCOPY WITH PROPOFOL;  Surgeon: Lin Landsman, MD;  Location: [place];  Service: Endoscopy;  Laterality: N/A;   ESOPHAGOGASTRODUODENOSCOPY (EGD) WITH PROPOFOL N/A 11/04/2017   Procedure: ESOPHAGOGASTRODUODENOSCOPY (EGD) WITH PROPOFOL with biopsies;  Surgeon: Lin Landsman, MD;  Location: [place];  Service: Endoscopy;  Laterality: N/A;  sleep apnea   KNEE SURGERY Left    x5, post  basketball injury   LUMBAR LAMINECTOMY/DECOMPRESSION MICRODISCECTOMY Left 01/22/2016   Procedure: Laminectomy for facet/synovial cyst - left - Lumbar four - lumbar five;  Surgeon: Earnie Larsson, MD;  Location: [place];  Service: Neurosurgery;  Laterality: Left;  Laminectomy for facet/synovial cyst - left - Lumbar four - lumbar five   POLYPECTOMY N/A 11/04/2017   Procedure: POLYPECTOMY INTESTINAL;  Surgeon: Lin Landsman, MD;  Location: [place];  Service: Endoscopy;  Laterality: N/A;   SHOULDER SURGERY Left    x2   SPINE SURGERY N/A    [employer] 08/07/2019   TOE SURGERY Bilateral    bone spurs    Family Psychiatric History: As below   Family History:  Family History  Problem Relation Age of Onset   Heart disease Father    Hyperlipidemia Father    Alcohol abuse Brother    Alcohol abuse Paternal Uncle    Breast cancer Maternal Aunt        70's   Colon cancer Neg Hx     Social History: Single,no children,  lives alone, on disability, focuses on taking care of her elderly parents  Social History   Socioeconomic History   Marital status: Single    Spouse name: Not on file   Number of children: 0   Years of education: Not on file   Highest education level: Bachelor's degree (e.g., BA, AB, BS)  Occupational History   Not on file  Tobacco Use   Smoking status: Former    Packs/day: 1.00    Years: 1.00    Total pack years: 1.00    Types: Cigarettes    Quit date: 04/15/1996    Years since quitting: 25.4   Smokeless tobacco: Never   Tobacco comments:    started back again in 2016, smoked for about 6 mo and then quit  Vaping Use   Vaping Use: Never used  Substance and Sexual Activity   Alcohol use: No    Alcohol/week: 0.0 standard drinks of alcohol    Comment: quit 20 years   Drug use: Yes    Frequency: 14.0 times per week    Types: Marijuana    Comment: 20 yrs. ago- cocaine    Sexual activity: Not Currently  Other Topics Concern   Not on file  Social History  Narrative   Not on file   Social Determinants of Health   Financial Resource Strain: Not on file  Food Insecurity: Food Insecurity Present (04/22/2018)   Hunger Vital Sign    Worried About Running Out of Food in the Last Year: Sometimes true    Ran Out of Food in the Last Year: Not on file  Transportation Needs: No Transportation Needs (04/22/2018)   PRAPARE - Hydrologist (Medical): No    Lack of Transportation (Non-Medical): No  Physical Activity:  Inactive (04/22/2018)   Exercise Vital Sign    Days of Exercise per Week: 0 days    Minutes of Exercise per Session: 0 min  Stress: Stress Concern Present (04/22/2018)   [place]    Feeling of Stress : Very much  Social Connections: Unknown (04/22/2018)   Social Connection and Isolation Panel [NHANES]    Frequency of Communication with Friends and Family: Not on file    Frequency of Social Gatherings with Friends and Family: Not on file    Attends Religious Services: 1 to 4 times per year    Active Member of [employer] or Organizations: Not on file    Attends Archivist Meetings: Not on file    Marital Status: Never married    Allergies:  Allergies  Allergen Reactions   Other    Effexor [Venlafaxine] Other (See Comments)    UNSPECIFIED REACTION, headaches, felt funny, withdrawal with missed dose    Lamictal [Lamotrigine] Rash    Metabolic Disorder Labs: Lab Results  Component Value Date   HGBA1C 5.6 09/17/2021   No results found for: "PROLACTIN" Lab Results  Component Value Date   CHOL 188 09/17/2021   TRIG 169 (H) 09/17/2021   HDL 69 09/17/2021   CHOLHDL 2.7 09/17/2021   VLDL 22.2 06/22/2018   LDLCALC 90 09/17/2021   LDLCALC 79 08/10/2019   Lab Results  Component Value Date   TSH 1.480 09/17/2021   TSH 1.310 04/05/2020    Therapeutic Level Labs: No results found for: "LITHIUM" No results found for:  "VALPROATE" No results found for: "CBMZ"  Current Medications: Current Outpatient Medications  Medication Sig Dispense Refill   albuterol (VENTOLIN HFA) 108 (90 Base) MCG/ACT inhaler TAKE 2 PUFFS BY MOUTH EVERY 4 TO 6 HOURS AS NEEDED (Patient not taking: Reported on 05/17/2021) 18 each 1   aspirin 81 MG tablet Take 81 mg by mouth daily.     Azelastine HCl 137 MCG/SPRAY SOLN PLACE 2 SPRAYS INTO BOTH NOSTRILS 2 (TWO) TIMES DAILY 30 mL 3   buPROPion (WELLBUTRIN XL) 300 MG 24 hr tablet Take 1 tablet (300 mg total) by mouth daily. 30 tablet 1   Calcium Carb-Cholecalciferol (OYSTER SHELL CALCIUM W/D) 500-5 MG-MCG TABS TAKE 1 TABLET BY MOUTH THREE TIMES A DAY 90 tablet 6   calcium-vitamin D (OSCAL WITH D) 500-200 MG-UNIT tablet Take 1 tablet by mouth 3 (three) times daily. 90 tablet 6   chlorpheniramine-HYDROcodone (TUSSIONEX PENNKINETIC ER) 10-8 MG/5ML Take 5 mLs by mouth at bedtime as needed for cough. (Patient not taking: Reported on 05/17/2021) 50 mL 0   fluticasone (FLONASE) 50 MCG/ACT nasal spray Place 2 sprays into both nostrils daily. 16 g 6   gabapentin (NEURONTIN) 300 MG capsule Take 1 capsule by mouth 3 (three) times daily.     ipratropium (ATROVENT) 0.03 % nasal spray PLACE 2 SPRAYS IN EACH NOSTRIL TWICE A DAY AS NEEDED FOR RUNNY NOSE/DRAINAGE DOWN THROAT 90 mL 1   levocetirizine (XYZAL) 5 MG tablet TAKE 1 TABLET BY MOUTH EVERY DAY IN THE EVENING 30 tablet 5   meloxicam (MOBIC) 7.5 MG tablet Take 1 tablet (7.5 mg total) by mouth 2 (two) times daily as needed for pain. 30 tablet 2   montelukast (SINGULAIR) 10 MG tablet TAKE 1 TABLET BY MOUTH EVERYDAY AT BEDTIME 30 tablet 5   morphine (MSIR) 15 MG tablet Take 15 mg by mouth every 4 (four) hours as needed for severe pain.  Olopatadine HCl 0.2 % SOLN Place 1 drop in each eye once a day as needed for itchy watery eyes 7.5 mL 5   pantoprazole (PROTONIX) 40 MG tablet TAKE 1 TABLET BY MOUTH EVERY DAY 30 tablet 5   simvastatin (ZOCOR) 20 MG tablet  TAKE 1 TABLET BY MOUTH EVERY DAY AT 6PM 90 tablet 1   TRELEGY ELLIPTA 200-62.5-25 MCG/ACT AEPB TAKE 1 PUFF BY MOUTH EVERY DAY 60 each 0   triamcinolone (NASACORT) 55 MCG/ACT AERO nasal inhaler      zonisamide (ZONEGRAN) 100 MG capsule Take 1 capsule (100 mg total) by mouth at bedtime. 30 capsule 1   Current Facility-Administered Medications  Medication Dose Route Frequency Provider Last Rate Last Admin   mepolizumab (NUCALA) injection 100 mg  100 mg Subcutaneous Q28 days Kennith Gain, MD   100 mg at 09/20/21 1015     Musculoskeletal: Strength & Muscle Tone: within normal limits Gait & Station: normal Patient leans: N/A  Psychiatric Specialty Exam: Please note limitations in obtaining full exam in the context of phone communication Review of Systems reports long history of asthma, states currently improved/more stable  There were no vitals taken for this visit.There is no height or weight on file to calculate BMI.  General Appearance: NAD  Eye Contact:  as above  Speech:  Normal Rate  Volume:  Normal  Mood:  r reports mood is improved, states she is feeling better  Affect: Noted to be more reactive/full in range  Thought Process:  Linear/ intact   Orientation:  Full (Time, Place, and Person)  Thought Content: denies hallucinations , no delusions expressed, denies suicidal or self injurious thoughts  Suicidal Thoughts:  No denies self injurious or suicidal ideations, presents future oriented  Homicidal Thoughts:  No denies violent or homicidal ideations  Memory:   grossly intact   Judgement:  Other:  present   Insight:  Present  Psychomotor Activity:  NA  Concentration:  Concentration: Good  Recall:  Good  Fund of Knowledge: Good  Language: Good  Akathisia:  Negative  Handed:  Right  AIMS (if indicated): not done  Assets:  Communication Skills Housing Others:  close to her parents, whom she helps care for  ADL's:  Intact  Cognition: WNL  Sleep:   reports  stable    Screenings: [place] Office Visit from 04/17/2020 in Primary Care at [facility]  Total Score (max 30 points ) 30      PHQ2-9    [place] Visit from 09/17/2021 in Primary Care at [facility] Visit from 02/13/2021 in Primary Care at [facility] Visit from 12/19/2020 in Primary Care at [facility] Visit from 04/17/2020 in Primary Care at [place] from 06/22/2018 in [place] at [facility] Total Score '1 2 3 '$ 0 0  PHQ-9 Total Score '6 12 13 '$ -- --        Assessment and Plan:  56year old female, history of Bipolar Disorder , Borderline P. D  per chart , Alcohol Use Disorder in sustained remission, Cannabis Use Disorder  Had been following with Dr Montel Culver for medication management , who has retired .    Endorses improvement and describes mood as improved.  Currently denies depression or significant neurovegetative symptoms.  No hypomanic or manic symptoms described are noted.  She reports she is functioning well in her daily activities.  Describes her mother's failing physical health as an ongoing  stressor.  She visits her mother regularly/often daily to provide support.  Reports that her mother is her representative payee for disability monies and is hoping to have this switched to another family member or to herself.  On last visit she had reported some lightheadedness which she attributed to not having had breakfast that day.  She does not endorse any further episodes of this type.  She has recent labs (CMP, CBC, TSH) in chart which were reviewed and are unremarkable.  She reports current medication regimen is well-tolerated and effective.  We have reviewed side effects.  She is aware that zonisamide is off label for management of mood disorder but she states that for her it has been "very helpful".  Her preference is to continue current regimen at this time.  I have also informed  patient that there is a psychiatrist starting to work at our clinic in the next month or 2 and who may take over her case once established in the clinic.  She expresses understanding.    Continue Welllbutrin XL 300 mgr QDAY for depression ( 1 m, 1 refill)  Continue Zonisamide 100 mgr QHS for mood disorder  ( 1 m,  1 refill )  Will see in 4 -6 weeks , agrees to contact clinic sooner if any worsening or medication concern prior She has an established PCP and an allergist she follows with for medical issues.    Jenne Campus, MD 10/07/2021, 8:41 AM   Patient ID: [name], female   DOB: Jul 24, 1965, 56 y.o.   MRN: [id number]

## 2021-10-18 ENCOUNTER — Ambulatory Visit (INDEPENDENT_AMBULATORY_CARE_PROVIDER_SITE_OTHER): Payer: Medicare HMO

## 2021-10-18 DIAGNOSIS — J4550 Severe persistent asthma, uncomplicated: Secondary | ICD-10-CM

## 2021-10-31 ENCOUNTER — Encounter: Payer: Self-pay | Admitting: Allergy

## 2021-10-31 ENCOUNTER — Ambulatory Visit: Payer: Medicare HMO | Admitting: Allergy

## 2021-10-31 VITALS — BP 146/80 | HR 73 | Temp 98.0°F | Resp 16 | Wt 148.8 lb

## 2021-10-31 DIAGNOSIS — H1013 Acute atopic conjunctivitis, bilateral: Secondary | ICD-10-CM | POA: Diagnosis not present

## 2021-10-31 DIAGNOSIS — J4550 Severe persistent asthma, uncomplicated: Secondary | ICD-10-CM | POA: Diagnosis not present

## 2021-10-31 DIAGNOSIS — K219 Gastro-esophageal reflux disease without esophagitis: Secondary | ICD-10-CM | POA: Diagnosis not present

## 2021-10-31 DIAGNOSIS — J329 Chronic sinusitis, unspecified: Secondary | ICD-10-CM

## 2021-10-31 DIAGNOSIS — J3089 Other allergic rhinitis: Secondary | ICD-10-CM | POA: Diagnosis not present

## 2021-10-31 MED ORDER — OLOPATADINE HCL 0.2 % OP SOLN: OPHTHALMIC | 5 refills | Status: DC

## 2021-10-31 MED ORDER — MONTELUKAST SODIUM 10 MG PO TABS: ORAL_TABLET | ORAL | 5 refills | Status: DC

## 2021-10-31 MED ORDER — TRELEGY ELLIPTA 200-62.5-25 MCG/ACT IN AEPB: INHALATION_SPRAY | RESPIRATORY_TRACT | 5 refills | Status: DC

## 2021-10-31 MED ORDER — ALBUTEROL SULFATE HFA 108 (90 BASE) MCG/ACT IN AERS: INHALATION_SPRAY | RESPIRATORY_TRACT | 1 refills | Status: DC

## 2021-10-31 NOTE — Progress Notes (Signed)
Follow-up Note  RE: [name] MRN: [id number] DOB: February 18, 1966 Date of Office Visit: 10/31/2021   History of present illness: [name] is a 56 y.o. female presenting today for follow-up of asthma, allergic rhinitis with conjunctivitis, reflux and recurrent sinusitis.  She was last seen in the office on 05/17/2021 by myself.  She has not had any major health changes, surgeries or hospitalizations since this time. She was started on Nucala since the last visit and she is now had 6 doses.  She is tolerating the injections without any large local or systemic reactions.  She does feel that the Nucala has helped with her asthma control.  She is coughing a lot less.  She is having less mucus production in her airway.  She states however the last couple of days she has noticed some rattling in her chest more than usual and believes it could be related to the changes in the weather we have been having.  She uses her albuterol a few times in the past 6 months.  She is on Trelegy 200 mcg using 1 puff once a day.  This also has been a big help with her control. She has not had any infections or need for antibiotic needs since the last visit. Her allergy symptoms are quite stable with the use of Singulair as well as Xyzal as he did.  She also will use ipratropium or Flonase as needed for nasal congestion or drainage. She does continue pantoprazole for reflux control.  Review of systems: Review of Systems  Constitutional: Negative.   HENT: Negative.    Eyes: Negative.   Respiratory:         See HPI  Cardiovascular: Negative.   Gastrointestinal: Negative.   Musculoskeletal: Negative.   Skin: Negative.   Allergic/Immunologic: Negative.   Neurological: Negative.      All other systems negative unless noted above in HPI  Past medical/social/surgical/family history have been reviewed and are unchanged unless specifically indicated below.  No changes  Medication List: Current Outpatient  Medications  Medication Sig Dispense Refill   aspirin 81 MG tablet Take 81 mg by mouth daily.     Azelastine HCl 137 MCG/SPRAY SOLN PLACE 2 SPRAYS INTO BOTH NOSTRILS 2 (TWO) TIMES DAILY 30 mL 3   buPROPion (WELLBUTRIN XL) 300 MG 24 hr tablet Take 1 tablet (300 mg total) by mouth daily. 30 tablet 1   Calcium Carb-Cholecalciferol (OYSTER SHELL CALCIUM W/D) 500-5 MG-MCG TABS TAKE 1 TABLET BY MOUTH THREE TIMES A DAY 90 tablet 6   calcium-vitamin D (OSCAL WITH D) 500-200 MG-UNIT tablet Take 1 tablet by mouth 3 (three) times daily. 90 tablet 6   chlorpheniramine-HYDROcodone (TUSSIONEX PENNKINETIC ER) 10-8 MG/5ML Take 5 mLs by mouth at bedtime as needed for cough. 50 mL 0   fluticasone (FLONASE) 50 MCG/ACT nasal spray Place 2 sprays into both nostrils daily. 16 g 6   gabapentin (NEURONTIN) 300 MG capsule Take 1 capsule by mouth 3 (three) times daily.     ipratropium (ATROVENT) 0.03 % nasal spray PLACE 2 SPRAYS IN EACH NOSTRIL TWICE A DAY AS NEEDED FOR RUNNY NOSE/DRAINAGE DOWN THROAT 90 mL 1   levocetirizine (XYZAL) 5 MG tablet TAKE 1 TABLET BY MOUTH EVERY DAY IN THE EVENING 30 tablet 5   morphine (MSIR) 15 MG tablet Take 15 mg by mouth every 4 (four) hours as needed for severe pain.     pantoprazole (PROTONIX) 40 MG tablet TAKE 1 TABLET BY MOUTH EVERY  DAY 30 tablet 5   simvastatin (ZOCOR) 20 MG tablet TAKE 1 TABLET BY MOUTH EVERY DAY AT 6PM 90 tablet 1   triamcinolone (NASACORT) 55 MCG/ACT AERO nasal inhaler      zonisamide (ZONEGRAN) 100 MG capsule Take 1 capsule (100 mg total) by mouth at bedtime. 30 capsule 1   albuterol (VENTOLIN HFA) 108 (90 Base) MCG/ACT inhaler TAKE 2 PUFFS BY MOUTH EVERY 4 TO 6 HOURS AS NEEDED 18 each 1   Fluticasone-Umeclidin-Vilant (TRELEGY ELLIPTA) 200-62.5-25 MCG/ACT AEPB TAKE 1 PUFF BY MOUTH EVERY DAY 28 each 5   montelukast (SINGULAIR) 10 MG tablet TAKE 1 TABLET BY MOUTH EVERYDAY AT BEDTIME 30 tablet 5   Olopatadine HCl 0.2 % SOLN Place 1 drop in each eye once a day as  needed for itchy watery eyes 7.5 mL 5   Current Facility-Administered Medications  Medication Dose Route Frequency Provider Last Rate Last Admin   mepolizumab (NUCALA) injection 100 mg  100 mg Subcutaneous Q28 days Kennith Gain, MD   100 mg at 10/18/21 1028     Known medication allergies: Allergies  Allergen Reactions   Other    Effexor [Venlafaxine] Other (See Comments)    UNSPECIFIED REACTION, headaches, felt funny, withdrawal with missed dose    Lamictal [Lamotrigine] Rash     Physical examination: Blood pressure (!) 146/80, pulse 73, temperature 98 F (36.7 C), temperature source Temporal, resp. rate 16, weight 148 lb 12.8 oz (67.5 kg), SpO2 98 %.  General: Alert, interactive, in no acute distress. HEENT: PERRLA, TMs pearly gray, turbinates minimally edematous without discharge, post-pharynx non erythematous. Neck: Supple without lymphadenopathy. Lungs: Clear to auscultation without wheezing, rhonchi or rales. {no increased work of breathing. CV: Normal S1, S2 without murmurs. Abdomen: Nondistended, nontender. Skin: Warm and dry, without lesions or rashes. Extremities:  No clubbing, cyanosis or edema. Neuro:   Grossly intact.  Diagnositics/Labs:  Spirometry: FEV1: 2.24L 96%, FVC: 2.72L 93%, ratio consistent with nonobstructive pattern  Assessment and plan:   Allergic rhinitis with conjunctivitis (grass pollen, weed pollen, molds, and mouse) Continue Singulair 10 mg once a day Continue Xyzal 5 mg once a day as needed Continue Pataday 1 drop each eye once a day as needed for itchy watery eyes Continue ipratropium bromide nasal spray using 2 sprays each nostril twice a day as needed for runny nose/drainage down throat Continue fluticasone nasal spray 2 sprays each nostril once a day for stuffy nose. May use saline nasal rinse as needed for nasal symptoms. Use this prior to any medicated nasal spray.  Moderate persistent asthma  Under better control since  starting Nucala.  Continue Trelegy 250mg 1 puff once a day.   Continue monthly Nucala injections.  Continue Singulair 10 mg as above May use albuterol 2 puffs every 4 hours as needed for cough, wheeze, tightness in chest, or shortness of breath. Also, may use albuterol 2 puffs 5-15 minutes prior to exercise  Recurrent sinusitis  Strep pneumonia titers are better but she still does not have great protection.  She now has 35% protective rate up from 22% previously.   Let see how you do with infections and if you still struggle with viral or bacterial illnesses then will have to discuss immunoglobulin replacement therapy  Reflux Use pantoprazole daily for reflux control Continue dietary and lifestyle modifications as below  Follow-up in 4-6 months or sooner if needed  I appreciate the opportunity to take part in Maricsa's care. Please do not hesitate to contact me with  questions.  Sincerely,   Prudy Feeler, MD Allergy/Immunology [facility] [place] [facility] [place]

## 2021-10-31 NOTE — Patient Instructions (Signed)
Allergic rhinitis with conjunctivitis (grass pollen, weed pollen, molds, and mouse) Continue Singulair 10 mg once a day Continue Xyzal 5 mg once a day as needed Continue Pataday 1 drop each eye once a day as needed for itchy watery eyes Continue ipratropium bromide nasal spray using 2 sprays each nostril twice a day as needed for runny nose/drainage down throat Continue fluticasone nasal spray 2 sprays each nostril once a day for stuffy nose. May use saline nasal rinse as needed for nasal symptoms. Use this prior to any medicated nasal spray.  Moderate persistent asthma  Under better control since starting Nucala.  Continue Trelegy 243mg 1 puff once a day.   Continue monthly Nucala injections.  Continue Singulair 10 mg as above May use albuterol 2 puffs every 4 hours as needed for cough, wheeze, tightness in chest, or shortness of breath. Also, may use albuterol 2 puffs 5-15 minutes prior to exercise  Recurrent sinusitis  Strep pneumonia titers are better but she still does not have great protection.  She now has 35% protective rate up from 22% previously.   Let see how you do with infections and if you still struggle with viral or bacterial illnesses then will have to discuss immunoglobulin replacement therapy  Reflux Use pantoprazole daily for reflux control Continue dietary and lifestyle modifications as below  Follow-up in 4-6 months or sooner if needed

## 2021-11-06 ENCOUNTER — Ambulatory Visit (INDEPENDENT_AMBULATORY_CARE_PROVIDER_SITE_OTHER): Payer: Medicare HMO

## 2021-11-06 DIAGNOSIS — Z23 Encounter for immunization: Secondary | ICD-10-CM | POA: Diagnosis not present

## 2021-11-06 NOTE — Progress Notes (Signed)
Patient came in for shrinrix vaccine and influenza vaccine patient tolerated well

## 2021-11-11 ENCOUNTER — Other Ambulatory Visit: Payer: Self-pay | Admitting: Allergy

## 2021-11-15 ENCOUNTER — Ambulatory Visit: Payer: Medicare HMO

## 2021-11-15 ENCOUNTER — Encounter: Payer: Self-pay | Admitting: *Deleted

## 2021-11-20 ENCOUNTER — Ambulatory Visit (INDEPENDENT_AMBULATORY_CARE_PROVIDER_SITE_OTHER): Payer: Medicare HMO | Admitting: *Deleted

## 2021-11-20 DIAGNOSIS — J4550 Severe persistent asthma, uncomplicated: Secondary | ICD-10-CM

## 2021-11-27 ENCOUNTER — Ambulatory Visit
Admission: RE | Admit: 2021-11-27 | Discharge: 2021-11-27 | Disposition: A | Discharge status: Home / Self Care | Payer: Medicare HMO | Source: Ambulatory Visit | Attending: Neurosurgery | Admitting: Neurosurgery

## 2021-11-27 DIAGNOSIS — S32010A Wedge compression fracture of first lumbar vertebra, initial encounter for closed fracture: Secondary | ICD-10-CM

## 2021-11-27 DIAGNOSIS — M4316 Spondylolisthesis, lumbar region: Secondary | ICD-10-CM | POA: Diagnosis not present

## 2021-11-27 DIAGNOSIS — M8588 Other specified disorders of bone density and structure, other site: Secondary | ICD-10-CM | POA: Diagnosis not present

## 2021-12-04 ENCOUNTER — Telehealth (HOSPITAL_BASED_OUTPATIENT_CLINIC_OR_DEPARTMENT_OTHER): Payer: Medicare HMO | Admitting: Psychiatry

## 2021-12-04 ENCOUNTER — Encounter (HOSPITAL_COMMUNITY): Payer: Self-pay | Admitting: Psychiatry

## 2021-12-04 DIAGNOSIS — F3175 Bipolar disorder, in partial remission, most recent episode depressed: Secondary | ICD-10-CM

## 2021-12-04 MED ORDER — ZONISAMIDE 100 MG PO CAPS: 100.0000 mg | ORAL_CAPSULE | Freq: Every day | ORAL | 1 refills | Status: DC

## 2021-12-04 MED ORDER — BUPROPION HCL ER (XL) 300 MG PO TB24: 300.0000 mg | ORAL_TABLET | Freq: Every day | ORAL | 1 refills | Status: DC

## 2021-12-04 NOTE — Progress Notes (Signed)
[facility] MD/PA/NP OP Progress Note  12/04/2021 8:03 AM [name]  MRN:  [id number]  Chief Complaint:   medication management  This was a phone based appointment . Limitations associated with this type of communication have been reviewed. Patient's identity has been verified by two different identifiers . Location of parties  MD- [facility] outpatient clinic Patient - home  Duration 20 minutes   HPI: Janice . Brennan is a 56year old female . She was seeing [name] for outpatient psychiatric management . He has retired ( last seen by [name]. On 03/15/2020 ) .    56 year old single female, lives alone, on disability.   [name] reports she has been doing well .  Reports she is doing well in daily activities, focusing  on her parents , who live nearby , and on her pet dog .  Reports mood has been generally stable . Affect presents as reactive /full in range . Denies having any SI or self injurious ideations and denies hallucinations.  Reports her elderly mother has been her disability monies payee for a long time . Explains that because her mother is getting older and more forgetful she would like a letter requesting for her to be able to manage her money, or to transfer payee status to another family member .  Denies medication side effects. Reports medications have been effective and well tolerated . States she has done well with current treatment regimen.  ( On Wellbutrin  and Zonegran  ). Side effects reviewed, including risk of seizure on Wellbutrin ( denies any history of seizures ) , and rare but serious potential side effects of zonisamide including risk of   severe rash , heat intolerance/heat stroke, nephrolithiasis,  metabolic acidosis .    Have reviewed possibility of adding another mood stabilizer to zonisamide ( I have reviewed that this medication is more often used as augmentation with another mood stabilizer ) . Her preference is not to change or alter her current medication regimen ,  states current med regimen has been helpful , well tolerated and that she feels she has been stable on it .   Continues to follow with Allergist for asthma , which she reports she has had for many years, and also sees an MD for management of lower back pain associated with lumbar compression fracture .   Remote history of AUD . Remains abstinent , has been abstinent for many years .     Visit Diagnosis: Bipolar Disorder II by history   Past Psychiatric History: Reports history of Bipolar Disorder diagnosis. Chart notes also indicate history of Borderline Personality Disorder .  Past history of Alcohol Use Disorder , reports has been sober /abstinent for several years .   Past Medical History:  Past Medical History:  Diagnosis Date   Anxiety    Arthritis    neck, knees, shoulders   Asthma    Bipolar disorder ([place])    currently feeling MANIC- 10/02/2016   Depression    GERD (gastroesophageal reflux disease)    Hip fracture ([place]) 06/2019   History of blood transfusion    as a newborn    History of lump of left breast    Hyperlipidemia    Lumbar pseudoarthrosis    Motion sickness    cars   OSA (obstructive sleep apnea) 01/09/2016   can't afford CPAP   Personality disorder (HCC)    PONV (postoperative nausea and vomiting)    Post traumatic stress disorder (PTSD)  Substance abuse ([place])    Synovial cyst     Past Surgical History:  Procedure Laterality Date   COLONOSCOPY WITH PROPOFOL N/A 11/04/2017   Procedure: COLONOSCOPY WITH PROPOFOL;  Surgeon: Lin Landsman, MD;  Location: [place];  Service: Endoscopy;  Laterality: N/A;   ESOPHAGOGASTRODUODENOSCOPY (EGD) WITH PROPOFOL N/A 11/04/2017   Procedure: ESOPHAGOGASTRODUODENOSCOPY (EGD) WITH PROPOFOL with biopsies;  Surgeon: Lin Landsman, MD;  Location: [place];  Service: Endoscopy;  Laterality: N/A;  sleep apnea   KNEE SURGERY Left    x5, post basketball injury   LUMBAR LAMINECTOMY/DECOMPRESSION  MICRODISCECTOMY Left 01/22/2016   Procedure: Laminectomy for facet/synovial cyst - left - Lumbar four - lumbar five;  Surgeon: Earnie Larsson, MD;  Location: [place];  Service: Neurosurgery;  Laterality: Left;  Laminectomy for facet/synovial cyst - left - Lumbar four - lumbar five   POLYPECTOMY N/A 11/04/2017   Procedure: POLYPECTOMY INTESTINAL;  Surgeon: Lin Landsman, MD;  Location: [place];  Service: Endoscopy;  Laterality: N/A;   SHOULDER SURGERY Left    x2   SPINE SURGERY N/A    Phreesia 08/07/2019   TOE SURGERY Bilateral    bone spurs    Family Psychiatric History: As below   Family History:  Family History  Problem Relation Age of Onset   Heart disease Father    Hyperlipidemia Father    Alcohol abuse Brother    Alcohol abuse Paternal Uncle    Breast cancer Maternal Aunt        70's   Colon cancer Neg Hx     Social History: Single,no children,  lives alone, on disability, focuses on taking care of her elderly parents  Social History   Socioeconomic History   Marital status: Single    Spouse name: Not on file   Number of children: 0   Years of education: Not on file   Highest education level: Bachelor's degree (e.g., BA, AB, BS)  Occupational History   Not on file  Tobacco Use   Smoking status: Former    Packs/day: 1.00    Years: 1.00    Total pack years: 1.00    Types: Cigarettes    Quit date: 04/15/1996    Years since quitting: 25.6   Smokeless tobacco: Never   Tobacco comments:    started back again in 2016, smoked for about 6 mo and then quit  Vaping Use   Vaping Use: Never used  Substance and Sexual Activity   Alcohol use: No    Alcohol/week: 0.0 standard drinks of alcohol    Comment: quit 20 years   Drug use: Yes    Frequency: 14.0 times per week    Types: Marijuana    Comment: 20 yrs. ago- cocaine    Sexual activity: Not Currently  Other Topics Concern   Not on file  Social History Narrative   Not on file   Social Determinants of  Health   Financial Resource Strain: Not on file  Food Insecurity: Food Insecurity Present (04/22/2018)   Hunger Vital Sign    Worried About Running Out of Food in the Last Year: Sometimes true    Ran Out of Food in the Last Year: Not on file  Transportation Needs: No Transportation Needs (04/22/2018)   PRAPARE - Hydrologist (Medical): No    Lack of Transportation (Non-Medical): No  Physical Activity: Inactive (04/22/2018)   Exercise Vital Sign    Days of Exercise per Week:  0 days    Minutes of Exercise per Session: 0 min  Stress: Stress Concern Present (04/22/2018)   [place]    Feeling of Stress : Very much  Social Connections: Unknown (04/22/2018)   Social Connection and Isolation Panel [NHANES]    Frequency of Communication with Friends and Family: Not on file    Frequency of Social Gatherings with Friends and Family: Not on file    Attends Religious Services: 1 to 4 times per year    Active Member of [employer] or Organizations: Not on file    Attends Archivist Meetings: Not on file    Marital Status: Never married    Allergies:  Allergies  Allergen Reactions   Other    Effexor [Venlafaxine] Other (See Comments)    UNSPECIFIED REACTION, headaches, felt funny, withdrawal with missed dose    Lamictal [Lamotrigine] Rash    Metabolic Disorder Labs: Lab Results  Component Value Date   HGBA1C 5.6 09/17/2021   No results found for: "PROLACTIN" Lab Results  Component Value Date   CHOL 188 09/17/2021   TRIG 169 (H) 09/17/2021   HDL 69 09/17/2021   CHOLHDL 2.7 09/17/2021   VLDL 22.2 06/22/2018   LDLCALC 90 09/17/2021   LDLCALC 79 08/10/2019   Lab Results  Component Value Date   TSH 1.480 09/17/2021   TSH 1.310 04/05/2020    Therapeutic Level Labs: No results found for: "LITHIUM" No results found for: "VALPROATE" No results found for: "CBMZ"  Current  Medications: Current Outpatient Medications  Medication Sig Dispense Refill   albuterol (VENTOLIN HFA) 108 (90 Base) MCG/ACT inhaler TAKE 2 PUFFS BY MOUTH EVERY 4 TO 6 HOURS AS NEEDED 18 each 1   aspirin 81 MG tablet Take 81 mg by mouth daily.     Azelastine HCl 137 MCG/SPRAY SOLN *NEED OFFICE VISIT* PLACE 2 SPRAYS INTO BOTH NOSTRILS 2 (TWO) TIMES DAILY 30 mL 3   buPROPion (WELLBUTRIN XL) 300 MG 24 hr tablet Take 1 tablet (300 mg total) by mouth daily. 30 tablet 1   Calcium Carb-Cholecalciferol (OYSTER SHELL CALCIUM W/D) 500-5 MG-MCG TABS TAKE 1 TABLET BY MOUTH THREE TIMES A DAY 90 tablet 6   calcium-vitamin D (OSCAL WITH D) 500-200 MG-UNIT tablet Take 1 tablet by mouth 3 (three) times daily. 90 tablet 6   chlorpheniramine-HYDROcodone (TUSSIONEX PENNKINETIC ER) 10-8 MG/5ML Take 5 mLs by mouth at bedtime as needed for cough. 50 mL 0   fluticasone (FLONASE) 50 MCG/ACT nasal spray Place 2 sprays into both nostrils daily. 16 g 6   Fluticasone-Umeclidin-Vilant (TRELEGY ELLIPTA) 200-62.5-25 MCG/ACT AEPB TAKE 1 PUFF BY MOUTH EVERY DAY 28 each 5   gabapentin (NEURONTIN) 300 MG capsule Take 1 capsule by mouth 3 (three) times daily.     ipratropium (ATROVENT) 0.03 % nasal spray PLACE 2 SPRAYS IN EACH NOSTRIL TWICE A DAY AS NEEDED FOR RUNNY NOSE/DRAINAGE DOWN THROAT 90 mL 1   levocetirizine (XYZAL) 5 MG tablet TAKE 1 TABLET BY MOUTH EVERY DAY IN THE EVENING 30 tablet 5   montelukast (SINGULAIR) 10 MG tablet TAKE 1 TABLET BY MOUTH EVERYDAY AT BEDTIME 30 tablet 5   morphine (MSIR) 15 MG tablet Take 15 mg by mouth every 4 (four) hours as needed for severe pain.     Olopatadine HCl 0.2 % SOLN Place 1 drop in each eye once a day as needed for itchy watery eyes 7.5 mL 5   pantoprazole (PROTONIX) 40 MG tablet  TAKE 1 TABLET BY MOUTH EVERY DAY 30 tablet 5   simvastatin (ZOCOR) 20 MG tablet TAKE 1 TABLET BY MOUTH EVERY DAY AT 6PM 90 tablet 1   triamcinolone (NASACORT) 55 MCG/ACT AERO nasal inhaler      zonisamide  (ZONEGRAN) 100 MG capsule Take 1 capsule (100 mg total) by mouth at bedtime. 30 capsule 1   Current Facility-Administered Medications  Medication Dose Route Frequency Provider Last Rate Last Admin   mepolizumab (NUCALA) injection 100 mg  100 mg Subcutaneous Q28 days Kennith Gain, MD   100 mg at 11/20/21 1138     Musculoskeletal: Strength & Muscle Tone: within normal limits Gait & Station: normal Patient leans: N/A  Psychiatric Specialty Exam: Please note limitations in obtaining full exam in the context of phone communication Review of Systems reports long history of asthma, history of back pain   There were no vitals taken for this visit.There is no height or weight on file to calculate BMI.  General Appearance: NA  Eye Contact:  as above  Speech:  Normal Rate  Volume:  Normal  Mood: reports mood as stable   Affect: reactive, appropriate   Thought Process:  Linear/ intact   Orientation:  Full (Time, Place, and Person)  Thought Content: denies hallucinations , no delusions expressed, denies suicidal or self injurious thoughts  Suicidal Thoughts:  No denies self injurious or suicidal ideations, presents future oriented  Homicidal Thoughts:  No denies violent or homicidal ideations  Memory:   grossly intact   Judgement:  Other:  present   Insight:  Present  Psychomotor Activity:  NA  Concentration:  Concentration: Good  Recall:  Good  Fund of Knowledge: Good  Language: Good  Akathisia:  Negative  Handed:  Right  AIMS (if indicated): not done  Assets:  Communication Skills Housing Others:  close to her parents, whom she helps care for  ADL's:  Intact  Cognition: WNL  Sleep:   reports stable    Screenings: [place] Office Visit from 04/17/2020 in Primary Care at [facility]  Total Score (max 30 points ) 30      PHQ2-9    [place] Visit from 09/17/2021 in Primary Care at [facility] Visit from 02/13/2021 in  Primary Care at [facility] Visit from 12/19/2020 in Primary Care at [place] from 04/17/2020 in Primary Care at [place] from 06/22/2018 in [place] at [facility] Total Score '1 2 3 '$ 0 0  PHQ-9 Total Score '6 12 13 '$ -- --        Assessment and Plan:  56year old female, history of Bipolar Disorder , Borderline P. D  per chart , Alcohol Use Disorder in sustained remission, Cannabis Use Disorder  Had been following with Janice Montel Culver for medication management , who has retired .   Mrs. Kerkhoff describes mood as generally stable.  She reports she is functioning well and daily activities.  Tolerating medications well.  We have reviewed medication regimen, side effects.  Have reviewed option of adding another medication/mood stabilizer to current regimen.  Her preference is to continue current medication regimen,, describes zonisamide as helpful/well-tolerated. Side effects reviewed. Of note, she reports her mother has been her representative payee for years.  She states that mother is becoming older and more forgetful and would like to be able to manage her own monies or have another family member become her payee.  She  is requesting a letter documenting that she would be able to manage her own monies.  We agreed to address this request on next (in person/face-to-face) appointment, to review cognitive status (which is grossly intact) / MMSE .     Continue Welllbutrin XL 300 mgr QDAY for depression ( 1 m, 1 refill)  Continue Zonisamide 100 mgr QHS for mood disorder  ( 1 m,  1 refill )   Will see in 4 -6 weeks , agrees to contact clinic sooner if any worsening or medication concern prior.  Patient requesting letter documenting that she can manage her own monies , does not need payee ( mother is her payee at this time) . She will come to appointment next visit to review request and to assess MMSE . )  She has an established PCP and an  allergist she follows with for medical issues.    Jenne Campus, MD 12/04/2021, 8:03 AM   Patient ID: [name], female   DOB: 12/19/1965, 56 y.o.   MRN: [id number]

## 2021-12-05 DIAGNOSIS — M5412 Radiculopathy, cervical region: Secondary | ICD-10-CM | POA: Diagnosis not present

## 2021-12-05 DIAGNOSIS — M4317 Spondylolisthesis, lumbosacral region: Secondary | ICD-10-CM | POA: Diagnosis not present

## 2021-12-10 DIAGNOSIS — M5412 Radiculopathy, cervical region: Secondary | ICD-10-CM | POA: Diagnosis not present

## 2021-12-10 DIAGNOSIS — M542 Cervicalgia: Secondary | ICD-10-CM | POA: Diagnosis not present

## 2021-12-12 ENCOUNTER — Other Ambulatory Visit (HOSPITAL_COMMUNITY): Payer: Self-pay | Admitting: Psychiatry

## 2021-12-18 ENCOUNTER — Ambulatory Visit (INDEPENDENT_AMBULATORY_CARE_PROVIDER_SITE_OTHER): Payer: Medicare HMO | Admitting: *Deleted

## 2021-12-18 DIAGNOSIS — J4550 Severe persistent asthma, uncomplicated: Secondary | ICD-10-CM | POA: Diagnosis not present

## 2021-12-30 ENCOUNTER — Ambulatory Visit (HOSPITAL_BASED_OUTPATIENT_CLINIC_OR_DEPARTMENT_OTHER): Payer: Medicare HMO | Admitting: Psychiatry

## 2021-12-30 ENCOUNTER — Encounter (HOSPITAL_COMMUNITY): Payer: Self-pay | Admitting: Psychiatry

## 2021-12-30 DIAGNOSIS — F319 Bipolar disorder, unspecified: Secondary | ICD-10-CM

## 2021-12-30 MED ORDER — ZONISAMIDE 100 MG PO CAPS: 100.0000 mg | ORAL_CAPSULE | Freq: Every day | ORAL | 1 refills | Status: DC

## 2021-12-30 MED ORDER — BUPROPION HCL ER (XL) 300 MG PO TB24: 300.0000 mg | ORAL_TABLET | Freq: Every day | ORAL | 1 refills | Status: DC

## 2021-12-30 NOTE — Progress Notes (Signed)
BH MD/PA/NP OP Progress Note  12/30/2021 1:52 PM [name]  MRN:  [id number]  Chief Complaint:   medication management  This was an in person/ face to face appointment .  Duration 25 minutes    HPI: [name] is a 56year old female . She was seeing Dr.Pucilowski for outpatient psychiatric management . He has retired ( last seen by Dr Mamie Nick. On 03/15/2020 ) .    [name] reports that in general she has been doing well in her daily activities. She lives close to her parents and visits them daily, more so recently as her mother has unfortunately become demented and needs more support .  She reports her mood has stable and presents with reactive /appropriate affect . She does not endorse anhedonia. We have reviewed history - she reports history of mood instability , with past episodes of depression but also brief episodes of increased energy /decreased need for sleep, which usually last for 1-2 days or so. Does not endorse history of full manic episodes . She has a remote history of cocaine/alcohol use disorder , stopped in the late 1990s and has been abstinent since . Denies medication side effects.  She reports that Zonegran has been effective, and feels her mood has been more stable on this medication,  and it has been better tolerated than past medication trials .  She has a history of asthma for which she sees an allergist regularly. She does not think Zonegran has had any adverse impact on allergies or asthma and states she had asthma for a long time prior to this medication being started .  She is requesting a letter addressing her ability to manage her own monies ( she is on disability). She explains that many years ago her mother became her payee , and because they are so close / live near each other she never saw a reason to contest this . However, mother has developed dementia and patient fears she may be too confused to continue payee role .    Visit Diagnosis: Bipolar Disorder II  by history   Past Psychiatric History: Reports history of Bipolar Disorder diagnosis. Chart notes also indicate history of Borderline Personality Disorder .  Past history of Alcohol Use Disorder , reports has been sober /abstinent for several years .   Past Medical History:  Past Medical History:  Diagnosis Date   Anxiety    Arthritis    neck, knees, shoulders   Asthma    Bipolar disorder ([place])    currently feeling MANIC- 10/02/2016   Depression    GERD (gastroesophageal reflux disease)    Hip fracture ([place]) 06/2019   History of blood transfusion    as a newborn    History of lump of left breast    Hyperlipidemia    Lumbar pseudoarthrosis    Motion sickness    cars   OSA (obstructive sleep apnea) 01/09/2016   can't afford CPAP   Personality disorder ([facility])    PONV (postoperative nausea and vomiting)    Post traumatic stress disorder (PTSD)    Substance abuse ([place])    Synovial cyst     Past Surgical History:  Procedure Laterality Date   COLONOSCOPY WITH PROPOFOL N/A 11/04/2017   Procedure: COLONOSCOPY WITH PROPOFOL;  Surgeon: Lin Landsman, MD;  Location: [facility];  Service: Endoscopy;  Laterality: N/A;   ESOPHAGOGASTRODUODENOSCOPY (EGD) WITH PROPOFOL N/A 11/04/2017   Procedure: ESOPHAGOGASTRODUODENOSCOPY (EGD) WITH PROPOFOL with biopsies;  Surgeon:  Lin Landsman, MD;  Location: [facility];  Service: Endoscopy;  Laterality: N/A;  sleep apnea   KNEE SURGERY Left    x5, post basketball injury   LUMBAR LAMINECTOMY/DECOMPRESSION MICRODISCECTOMY Left 01/22/2016   Procedure: Laminectomy for facet/synovial cyst - left - Lumbar four - lumbar five;  Surgeon: Earnie Larsson, MD;  Location: [facility];  Service: Neurosurgery;  Laterality: Left;  Laminectomy for facet/synovial cyst - left - Lumbar four - lumbar five   POLYPECTOMY N/A 11/04/2017   Procedure: POLYPECTOMY INTESTINAL;  Surgeon: Lin Landsman, MD;  Location: [facility];  Service: Endoscopy;   Laterality: N/A;   SHOULDER SURGERY Left    x2   SPINE SURGERY N/A    [employer] 08/07/2019   TOE SURGERY Bilateral    bone spurs    Family Psychiatric History: As below   Family History:  Family History  Problem Relation Age of Onset   Heart disease Father    Hyperlipidemia Father    Alcohol abuse Brother    Alcohol abuse Paternal Uncle    Breast cancer Maternal Aunt        70's   Colon cancer Neg Hx     Social History: Single,no children,  lives alone, on disability, focuses on taking care of her elderly parents  Social History   Socioeconomic History   Marital status: Single    Spouse name: Not on file   Number of children: 0   Years of education: Not on file   Highest education level: Bachelor's degree (e.g., BA, AB, BS)  Occupational History   Not on file  Tobacco Use   Smoking status: Former    Packs/day: 1.00    Years: 1.00    Total pack years: 1.00    Types: Cigarettes    Quit date: 04/15/1996    Years since quitting: 25.7   Smokeless tobacco: Never   Tobacco comments:    started back again in 2016, smoked for about 6 mo and then quit  Vaping Use   Vaping Use: Never used  Substance and Sexual Activity   Alcohol use: No    Alcohol/week: 0.0 standard drinks of alcohol    Comment: quit 20 years   Drug use: Yes    Frequency: 14.0 times per week    Types: Marijuana    Comment: 20 yrs. ago- cocaine    Sexual activity: Not Currently  Other Topics Concern   Not on file  Social History Narrative   Not on file   Social Determinants of Health   Financial Resource Strain: Not on file  Food Insecurity: Food Insecurity Present (04/22/2018)   Hunger Vital Sign    Worried About Running Out of Food in the Last Year: Sometimes true    Ran Out of Food in the Last Year: Not on file  Transportation Needs: No Transportation Needs (04/22/2018)   PRAPARE - Hydrologist (Medical): No    Lack of Transportation (Non-Medical): No  Physical  Activity: Inactive (04/22/2018)   Exercise Vital Sign    Days of Exercise per Week: 0 days    Minutes of Exercise per Session: 0 min  Stress: Stress Concern Present (04/22/2018)   [place]    Feeling of Stress : Very much  Social Connections: Unknown (04/22/2018)   Social Connection and Isolation Panel [NHANES]    Frequency of Communication with Friends and Family: Not on file    Frequency  of Social Gatherings with Friends and Family: Not on file    Attends Religious Services: 1 to 4 times per year    Active Member of [employer] or Organizations: Not on file    Attends Archivist Meetings: Not on file    Marital Status: Never married    Allergies:  Allergies  Allergen Reactions   Other    Effexor [Venlafaxine] Other (See Comments)    UNSPECIFIED REACTION, headaches, felt funny, withdrawal with missed dose    Lamictal [Lamotrigine] Rash    Metabolic Disorder Labs: Lab Results  Component Value Date   HGBA1C 5.6 09/17/2021   No results found for: "PROLACTIN" Lab Results  Component Value Date   CHOL 188 09/17/2021   TRIG 169 (H) 09/17/2021   HDL 69 09/17/2021   CHOLHDL 2.7 09/17/2021   VLDL 22.2 06/22/2018   LDLCALC 90 09/17/2021   LDLCALC 79 08/10/2019   Lab Results  Component Value Date   TSH 1.480 09/17/2021   TSH 1.310 04/05/2020    Therapeutic Level Labs: No results found for: "LITHIUM" No results found for: "VALPROATE" No results found for: "CBMZ"  Current Medications: Current Outpatient Medications  Medication Sig Dispense Refill   albuterol (VENTOLIN HFA) 108 (90 Base) MCG/ACT inhaler TAKE 2 PUFFS BY MOUTH EVERY 4 TO 6 HOURS AS NEEDED 18 each 1   aspirin 81 MG tablet Take 81 mg by mouth daily.     Azelastine HCl 137 MCG/SPRAY SOLN *NEED OFFICE VISIT* PLACE 2 SPRAYS INTO BOTH NOSTRILS 2 (TWO) TIMES DAILY 30 mL 3   buPROPion (WELLBUTRIN XL) 300 MG 24 hr tablet Take 1 tablet (300 mg  total) by mouth daily. 30 tablet 1   Calcium Carb-Cholecalciferol (OYSTER SHELL CALCIUM W/D) 500-5 MG-MCG TABS TAKE 1 TABLET BY MOUTH THREE TIMES A DAY 90 tablet 6   calcium-vitamin D (OSCAL WITH D) 500-200 MG-UNIT tablet Take 1 tablet by mouth 3 (three) times daily. 90 tablet 6   chlorpheniramine-HYDROcodone (TUSSIONEX PENNKINETIC ER) 10-8 MG/5ML Take 5 mLs by mouth at bedtime as needed for cough. 50 mL 0   fluticasone (FLONASE) 50 MCG/ACT nasal spray Place 2 sprays into both nostrils daily. 16 g 6   Fluticasone-Umeclidin-Vilant (TRELEGY ELLIPTA) 200-62.5-25 MCG/ACT AEPB TAKE 1 PUFF BY MOUTH EVERY DAY 28 each 5   gabapentin (NEURONTIN) 300 MG capsule Take 1 capsule by mouth 3 (three) times daily.     ipratropium (ATROVENT) 0.03 % nasal spray PLACE 2 SPRAYS IN EACH NOSTRIL TWICE A DAY AS NEEDED FOR RUNNY NOSE/DRAINAGE DOWN THROAT 90 mL 1   levocetirizine (XYZAL) 5 MG tablet TAKE 1 TABLET BY MOUTH EVERY DAY IN THE EVENING 30 tablet 5   montelukast (SINGULAIR) 10 MG tablet TAKE 1 TABLET BY MOUTH EVERYDAY AT BEDTIME 30 tablet 5   morphine (MSIR) 15 MG tablet Take 15 mg by mouth every 4 (four) hours as needed for severe pain.     Olopatadine HCl 0.2 % SOLN Place 1 drop in each eye once a day as needed for itchy watery eyes 7.5 mL 5   pantoprazole (PROTONIX) 40 MG tablet TAKE 1 TABLET BY MOUTH EVERY DAY 30 tablet 5   simvastatin (ZOCOR) 20 MG tablet TAKE 1 TABLET BY MOUTH EVERY DAY AT 6PM 90 tablet 1   triamcinolone (NASACORT) 55 MCG/ACT AERO nasal inhaler      zonisamide (ZONEGRAN) 100 MG capsule Take 1 capsule (100 mg total) by mouth at bedtime. 30 capsule 1   Current Facility-Administered Medications  Medication  Dose Route Frequency Provider Last Rate Last Admin   mepolizumab (NUCALA) injection 100 mg  100 mg Subcutaneous Q28 days Kennith Gain, MD   100 mg at 12/18/21 1114     Musculoskeletal: Strength & Muscle Tone: within normal limits Gait & Station: normal Patient leans:  N/A  Psychiatric Specialty Exam:  Review of Systems endorses history of back pain - has had back surgery in the past   There were no vitals taken for this visit.There is no height or weight on file to calculate BMI.  General Appearance: well groomed, calm, no restlessness or agitation, presents pleasant and cooperative   Eye Contact:  good eye contact   Speech:  Normal Rate  Volume:  Normal  Mood: reports mood as stable , does not endorse significant depression at this time   Affect: reactive, appropriate   Thought Process:  Linear/ intact   Orientation:  Full (Time, Place, and Person)  Thought Content: denies hallucinations , no delusions expressed, denies suicidal or self injurious thoughts  Suicidal Thoughts:  No denies self injurious or suicidal ideations, presents future oriented  Homicidal Thoughts:  No denies violent or homicidal ideations  Memory:   grossly intact   Judgement:  Other:  present   Insight:  Present  Psychomotor Activity:  NA  Concentration:  Concentration: Good  Recall:  Good  Fund of Knowledge: Good  Language: Good  Akathisia:  Negative  Handed:  Right  AIMS (if indicated): not done  Assets:  Communication Skills Housing Others:  close to her parents, whom she helps care for  ADL's:  Intact  Cognition: WNL  Sleep:   reports stable    Screenings: [place] Office Visit from 04/17/2020 in Primary Care at [facility]  Total Score (max 30 points ) 30      PHQ2-9    [place] Visit from 09/17/2021 in Primary Care at [facility] Visit from 02/13/2021 in Primary Care at [facility] from 12/19/2020 in [place] [facility] from 04/17/2020 in Primary Care at [facility] from 06/22/2018 in [place] at [facility]  PHQ-2 Total Score '1 2 3 '$ 0 0  PHQ-9 Total Score '6 12 13 '$ -- --        Assessment and Plan:  56year old female, history of Bipolar Disorder  , Borderline P. D  per chart , Cocaine Use Disorder /Alcohol Use Disorder in sustained remission,   Had been following with Dr Montel Culver for medication management , who has retired .   [name]  reports she has been doing well and describes mood as stable . Denies significant depression and does not endorse neuro-vegetative symptoms at this time . No current symptoms of hypomania/mania noted .  Denies medication side effects and reports current medications/doses have been effective . She is requesting a letter to support her being able to manage her own disability monies- her mother has been her designated payee for years but mother now has dementia. Patient presents stable , 0x 3 and cognitively presents grossly intact. Based on her presentation I do not see any clear reason or contraindication for being unable to manage her monies [place] check.   At her request will write letter, addressed to Whom it May Concern, referencing the above     Continue Welllbutrin XL 300 mgr QDAY for depression ( 1 m, 1 refill)  Continue Zonisamide 100 mgr QHS for mood disorder  (  1 m,  1 refill )   Will see in 6 weeks , agrees to contact clinic sooner if any worsening or medication concern prior.  She has clinic phone number and also number for crisis hotline if needed     Jenne Campus, MD 12/30/2021, 1:52 PM   Patient ID: [name], female   DOB: [birth year], 56 y.o.   MRN: [id number]

## 2021-12-31 ENCOUNTER — Telehealth (HOSPITAL_COMMUNITY): Payer: Self-pay | Admitting: *Deleted

## 2021-12-31 NOTE — Telephone Encounter (Signed)
Pt picked up [employer] form [id number] (01-2014) UF (01-2014); Physicians/Medical Officers Statement of Patient's Capability to [employer]. Form completed then reviewed and signed by Dr. Parke Poisson.

## 2022-01-03 IMAGING — CT CT CHEST W/ CM
2 of 4 series · 14 of 36 positions shown, 17 images · IV contrast (omnipaque)
Comparison: Radiograph 04/05/2020.

CLINICAL DATA: Abnormal chest x-ray with possible herniated bowel
in chest. Patient reports pain under left breast. Cough

EXAM:
CT CHEST WITH CONTRAST
TECHNIQUE: Multidetector CT imaging of the chest was performed during
intravenous contrast administration.
CONTRAST:  75mL OMNIPAQUE IOHEXOL 300 MG/ML  SOLN

[Series 2: axial st · axial · 0.69mm/px · z∈[-514,-250]mm · 11 of 156 slices shown, 14 images]
[im 12/156  mediastinal]
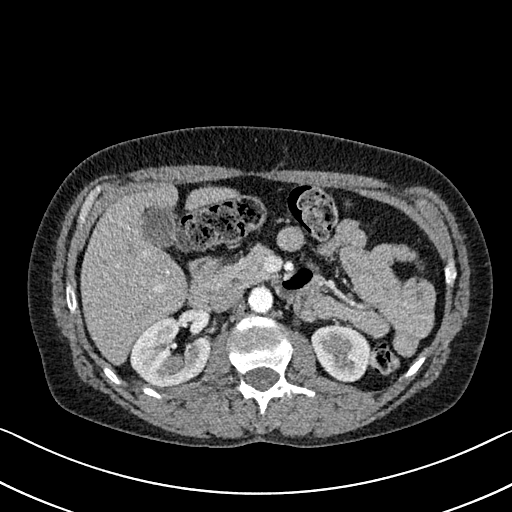
[im 12/156  lung]
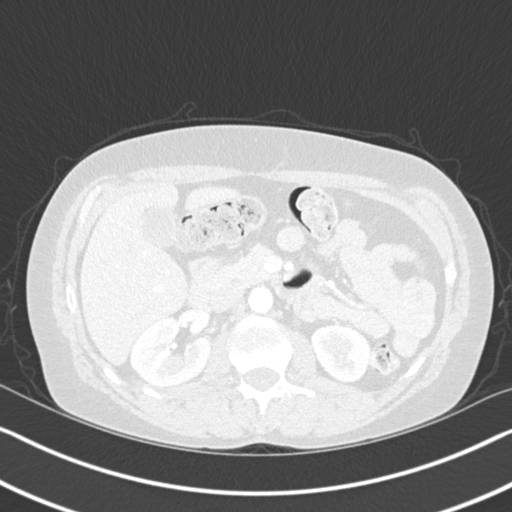
[im 24/156  lung]
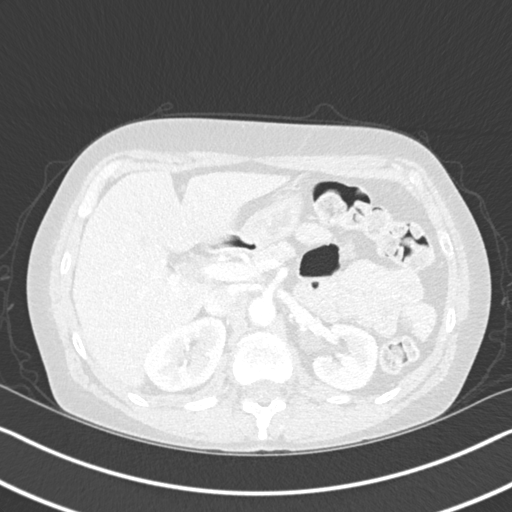
[im 36/156  lung]
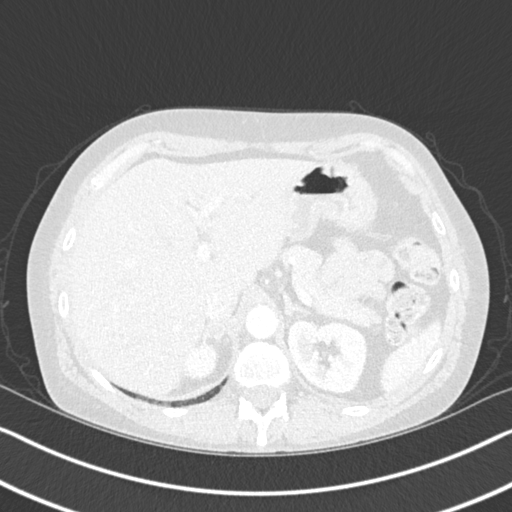
[im 48/156  lung]
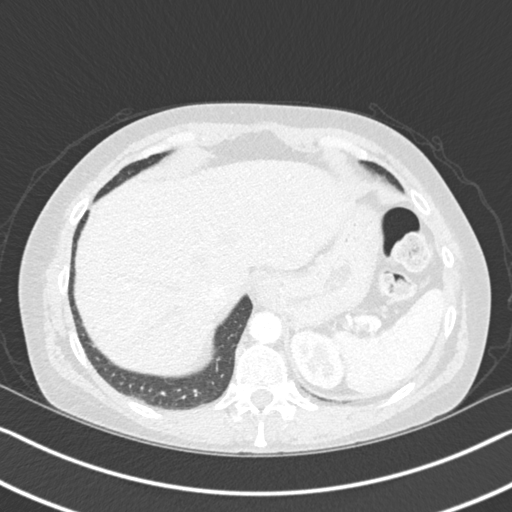
[im 60/156  mediastinal]
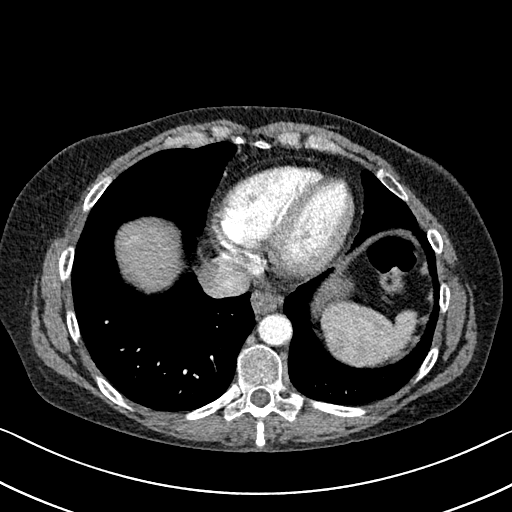
[im 60/156  lung]
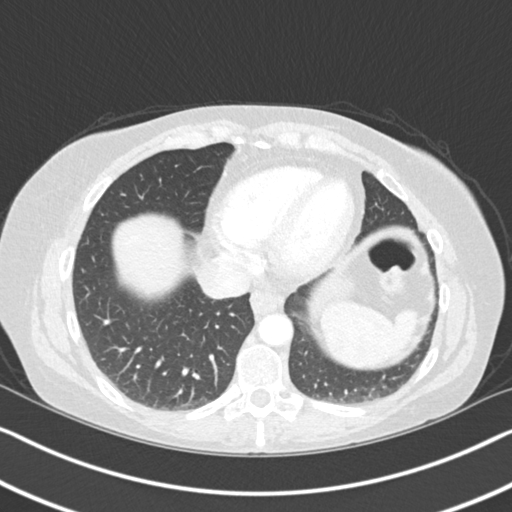
[im 84/156  lung]
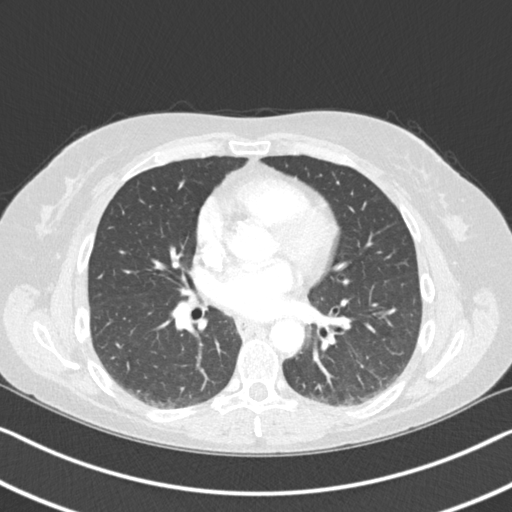
[im 96/156  lung]
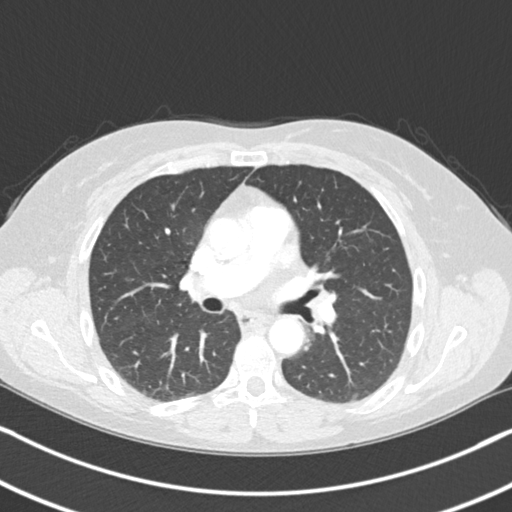
[im 108/156  lung]
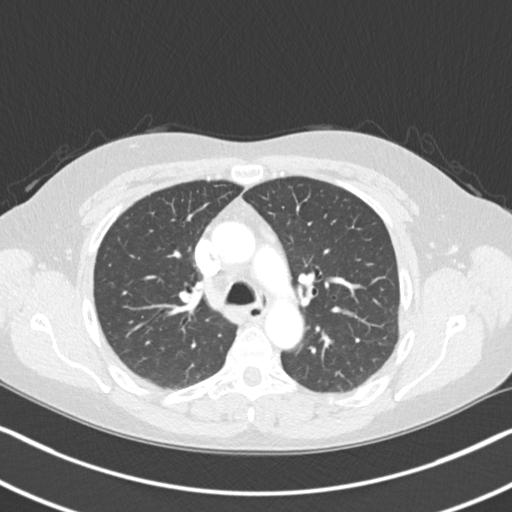
[im 120/156  mediastinal]
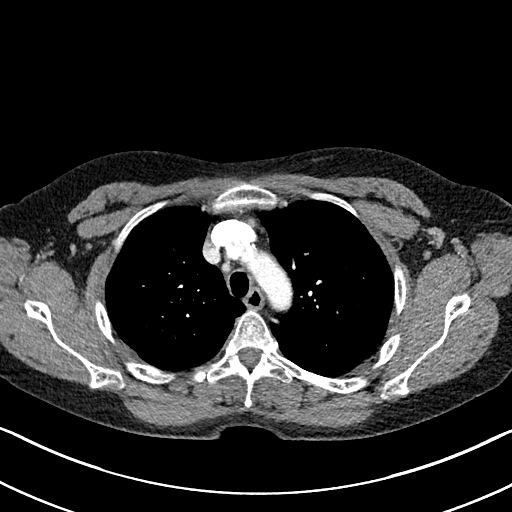
[im 120/156  lung]
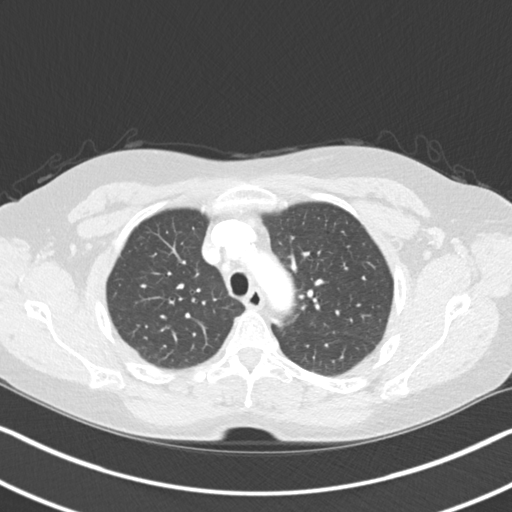
[im 132/156  lung]
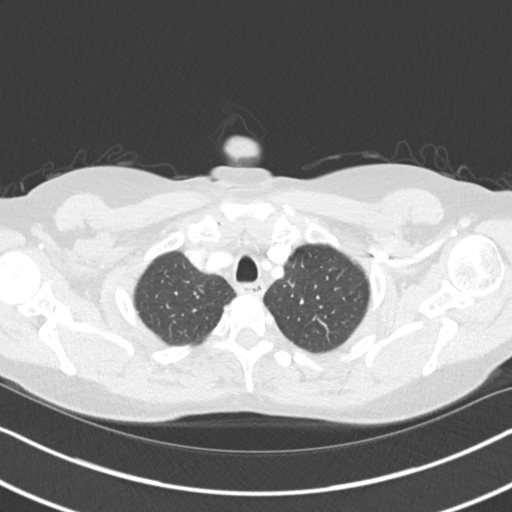
[im 144/156  lung]
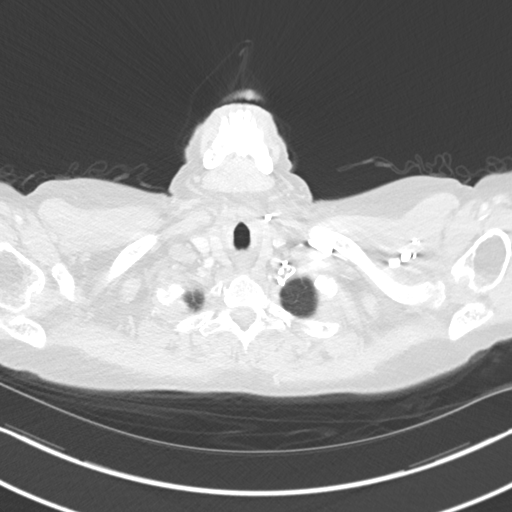

[Series 6: coronal · coronal · 0.63mm/px · 3 of 124 slices shown]
[im 25/124  lung]
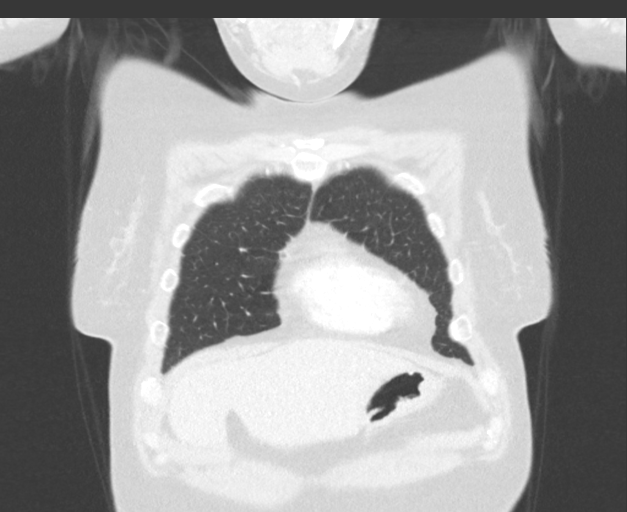
[im 50/124  lung]
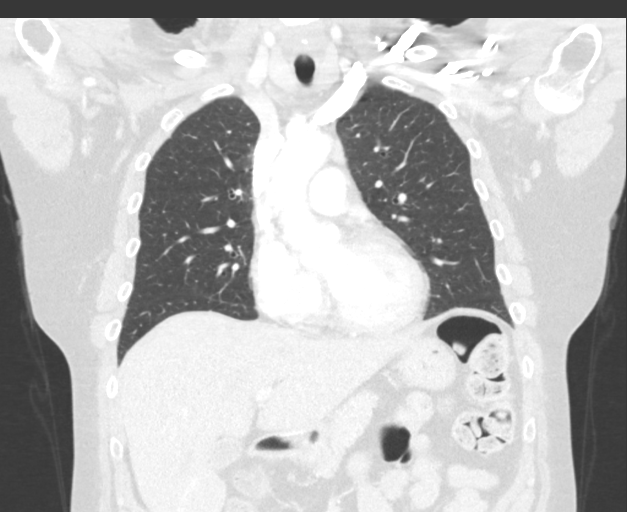
[im 74/124  lung]
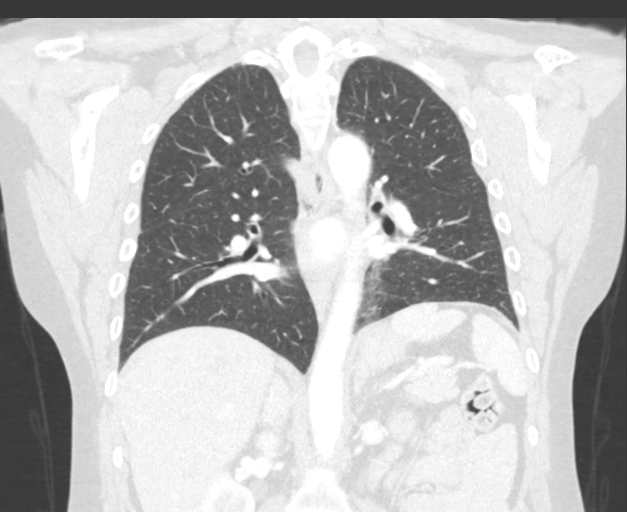

[14 of 36 positions shown; findings below may reference images not displayed]

FINDINGS: Cardiovascular: Thoracic aorta is normal in caliber. No aortic
aneurysm or acute aortic findings. Mild aortic atherosclerosis.
Heart is normal in size. No pericardial effusion. Central most
pulmonary arteries are patent.

Mediastinum/Nodes: No enlarged mediastinal, hilar, or axillary lymph
nodes. There is a tiny hiatal hernia. No thyroid nodule. There is a
right central anterior diaphragmatic defect spanning approximately
2.2 x 1.5 cm, series 6, image 14 and series 7, image 85. There is
herniation of intra-abdominal fat anterior to the right ventricle.
No bowel involvement. There is no bowel in this vicinity in the
upper abdomen.

Lungs/Pleura: Vague ground-glass opacity in the central medial left
lower lobe, series 5, image 83-87, adjacent to the descending aorta.
No confluent consolidation. No pulmonary mass. No findings of
pulmonary edema. No pleural fluid. Trachea and central bronchi are
patent.

Upper Abdomen: 10 mm low-density in the right hepatic dome, likely
cyst. Tiny hiatal hernia. No upper abdominal bowel loops approach
the anterior right diaphragmatic defect.

Musculoskeletal: L1 superior endplate compression fracture with
approximately 30% loss of height, unchanged from prior radiograph.
There is a longitudinally oriented nondisplaced fracture through the
spinous process of T12. Fracture of left twelfth rib at the
costovertebral junction with minimal surrounding callus formation.
No focal bone lesion.
IMPRESSION: 1. Right central anterior diaphragmatic defect spanning
approximately 2.2 x 1.5 cm. There is associated herniation of
intra-abdominal fat anterior to the right ventricle. No bowel
involvement. No upper abdominal bowel loops in the vicinity on the
current exam.
2. Fracture of left twelfth rib at the costovertebral junction with
minimal surrounding callus formation, likely subacute or remote.
3. L1 superior endplate compression fracture with approximately 30%
loss of height, unchanged from prior radiograph. Nondisplaced T12
spinous process fracture, appears remote.

Aortic Atherosclerosis ([id number]-4JE.E).

## 2022-01-08 ENCOUNTER — Other Ambulatory Visit: Payer: Self-pay | Admitting: Allergy

## 2022-01-10 ENCOUNTER — Other Ambulatory Visit: Payer: Self-pay | Admitting: Family Medicine

## 2022-01-10 NOTE — Telephone Encounter (Signed)
Requested Prescriptions  Pending Prescriptions Disp Refills   simvastatin (ZOCOR) 20 MG tablet [Pharmacy Med Name: SIMVASTATIN 20 MG TABLET] 90 tablet 0    Sig: TAKE 1 TABLET BY MOUTH EVERY DAY AT 6PM     Cardiovascular:  Antilipid - Statins Failed - 01/10/2022  2:19 AM      Failed - Lipid Panel in normal range within the last 12 months    Cholesterol, Total  Date Value Ref Range Status  09/17/2021 188 100 - 199 mg/dL Final   LDL Chol Calc (NIH)  Date Value Ref Range Status  09/17/2021 90 0 - 99 mg/dL Final   HDL  Date Value Ref Range Status  09/17/2021 69 >39 mg/dL Final   Triglycerides  Date Value Ref Range Status  09/17/2021 169 (H) 0 - 149 mg/dL Final         Passed - Patient is not pregnant      Passed - Valid encounter within last 12 months    Recent Outpatient Visits           3 months ago Annual physical exam   Primary Care at [facility], [facility], MD   11 months ago Persistent cough for 3 weeks or longer   Primary Care at [facility], MD   1 year ago Subacute cough   Primary Care at [facility], MD   1 year ago Moderate persistent asthma with exacerbation   Primary Care at [facility], MD   1 year ago Acute upper respiratory infection   Primary Care at [facility], [place], [place]

## 2022-01-15 ENCOUNTER — Ambulatory Visit (INDEPENDENT_AMBULATORY_CARE_PROVIDER_SITE_OTHER): Payer: Medicare HMO | Admitting: *Deleted

## 2022-01-15 DIAGNOSIS — J4550 Severe persistent asthma, uncomplicated: Secondary | ICD-10-CM | POA: Diagnosis not present

## 2022-01-20 ENCOUNTER — Other Ambulatory Visit: Payer: Self-pay | Admitting: Family Medicine

## 2022-01-27 ENCOUNTER — Encounter: Payer: Self-pay | Admitting: Family Medicine

## 2022-01-27 ENCOUNTER — Ambulatory Visit (INDEPENDENT_AMBULATORY_CARE_PROVIDER_SITE_OTHER): Payer: Medicare HMO | Admitting: Family Medicine

## 2022-01-27 VITALS — BP 142/89 | HR 87 | Temp 98.1°F | Resp 16 | Wt 148.0 lb

## 2022-01-27 DIAGNOSIS — Z0000 Encounter for general adult medical examination without abnormal findings: Secondary | ICD-10-CM

## 2022-01-27 DIAGNOSIS — F4321 Adjustment disorder with depressed mood: Secondary | ICD-10-CM | POA: Diagnosis not present

## 2022-01-27 DIAGNOSIS — J4541 Moderate persistent asthma with (acute) exacerbation: Secondary | ICD-10-CM

## 2022-01-27 MED ORDER — AMOXICILLIN-POT CLAVULANATE 875-125 MG PO TABS: 1.0000 | ORAL_TABLET | Freq: Two times a day (BID) | ORAL | 0 refills | Status: DC

## 2022-01-27 MED ORDER — CLONAZEPAM 0.5 MG PO TABS: 0.5000 mg | ORAL_TABLET | Freq: Two times a day (BID) | ORAL | 1 refills | Status: DC | PRN

## 2022-01-27 MED ORDER — PREDNISONE 50 MG PO TABS: 50.0000 mg | ORAL_TABLET | Freq: Every day | ORAL | 0 refills | Status: DC

## 2022-01-27 NOTE — Progress Notes (Unsigned)
Established Patient Office Visit  Subjective    Patient ID: [name], female    DOB: 01/31/1966  Age: 55 y.o. MRN: [id number]  CC:  Chief Complaint  Patient presents with   Cough    HPI [name] presents with complaint of cough productive of sputum and wheezes. Patient also reports that her mother is in the hospital in the end stages of life and she believes that her death is imminent.    Outpatient Encounter Medications as of 01/27/2022  Medication Sig   albuterol (VENTOLIN HFA) 108 (90 Base) MCG/ACT inhaler INHALE 2 PUFFS BY MOUTH EVERY 4 TO 6 HOURS AS NEEDED   amoxicillin-clavulanate (AUGMENTIN) 875-125 MG tablet Take 1 tablet by mouth 2 (two) times daily.   aspirin 81 MG tablet Take 81 mg by mouth daily.   Azelastine HCl 137 MCG/SPRAY SOLN *NEED OFFICE VISIT* PLACE 2 SPRAYS INTO BOTH NOSTRILS 2 (TWO) TIMES DAILY   buPROPion (WELLBUTRIN XL) 300 MG 24 hr tablet Take 1 tablet (300 mg total) by mouth daily.   Calcium Carb-Cholecalciferol (OYSTER SHELL CALCIUM W/D) 500-5 MG-MCG TABS TAKE 1 TABLET BY MOUTH THREE TIMES A DAY   calcium-vitamin D (OSCAL WITH D) 500-200 MG-UNIT tablet Take 1 tablet by mouth 3 (three) times daily.   clonazePAM (KLONOPIN) 0.5 MG tablet Take 1 tablet (0.5 mg total) by mouth 2 (two) times daily as needed for anxiety.   fluticasone (FLONASE) 50 MCG/ACT nasal spray Place 2 sprays into both nostrils daily.   Fluticasone-Umeclidin-Vilant (TRELEGY ELLIPTA) 200-62.5-25 MCG/ACT AEPB TAKE 1 PUFF BY MOUTH EVERY DAY   gabapentin (NEURONTIN) 300 MG capsule Take 1 capsule by mouth 3 (three) times daily.   ipratropium (ATROVENT) 0.03 % nasal spray PLACE 2 SPRAYS IN EACH NOSTRIL TWICE A DAY AS NEEDED FOR RUNNY NOSE/DRAINAGE DOWN THROAT   levocetirizine (XYZAL) 5 MG tablet TAKE 1 TABLET BY MOUTH EVERY DAY IN THE EVENING   montelukast (SINGULAIR) 10 MG tablet TAKE 1 TABLET BY MOUTH EVERYDAY AT BEDTIME   morphine (MSIR) 15 MG tablet Take 15 mg by mouth every 4  (four) hours as needed for severe pain.   Olopatadine HCl 0.2 % SOLN Place 1 drop in each eye once a day as needed for itchy watery eyes   pantoprazole (PROTONIX) 40 MG tablet TAKE 1 TABLET BY MOUTH EVERY DAY   predniSONE (DELTASONE) 50 MG tablet Take 1 tablet (50 mg total) by mouth daily with breakfast.   simvastatin (ZOCOR) 20 MG tablet TAKE 1 TABLET BY MOUTH EVERY DAY AT 6PM   triamcinolone (NASACORT) 55 MCG/ACT AERO nasal inhaler    zonisamide (ZONEGRAN) 100 MG capsule Take 1 capsule (100 mg total) by mouth at bedtime.   Facility-Administered Encounter Medications as of 01/27/2022  Medication   mepolizumab (NUCALA) injection 100 mg    Past Medical History:  Diagnosis Date   Anxiety    Arthritis    neck, knees, shoulders   Asthma    Bipolar disorder ([place])    currently feeling MANIC- 10/02/2016   Depression    GERD (gastroesophageal reflux disease)    Hip fracture ([place]) 06/2019   History of blood transfusion    as a newborn    History of lump of left breast    Hyperlipidemia    Lumbar pseudoarthrosis    Motion sickness    cars   OSA (obstructive sleep apnea) 01/09/2016   can't afford CPAP   Personality disorder (HCC)    PONV (postoperative nausea and vomiting)  Post traumatic stress disorder (PTSD)    Substance abuse ([place])    Synovial cyst     Past Surgical History:  Procedure Laterality Date   COLONOSCOPY WITH PROPOFOL N/A 11/04/2017   Procedure: COLONOSCOPY WITH PROPOFOL;  Surgeon: Lin Landsman, MD;  Location: [facility];  Service: Endoscopy;  Laterality: N/A;   ESOPHAGOGASTRODUODENOSCOPY (EGD) WITH PROPOFOL N/A 11/04/2017   Procedure: ESOPHAGOGASTRODUODENOSCOPY (EGD) WITH PROPOFOL with biopsies;  Surgeon: Lin Landsman, MD;  Location: [facility];  Service: Endoscopy;  Laterality: N/A;  sleep apnea   KNEE SURGERY Left    x5, post basketball injury   LUMBAR LAMINECTOMY/DECOMPRESSION MICRODISCECTOMY Left 01/22/2016   Procedure: Laminectomy  for facet/synovial cyst - left - Lumbar four - lumbar five;  Surgeon: Earnie Larsson, MD;  Location: [facility];  Service: Neurosurgery;  Laterality: Left;  Laminectomy for facet/synovial cyst - left - Lumbar four - lumbar five   POLYPECTOMY N/A 11/04/2017   Procedure: POLYPECTOMY INTESTINAL;  Surgeon: Lin Landsman, MD;  Location: [facility];  Service: Endoscopy;  Laterality: N/A;   SHOULDER SURGERY Left    x2   SPINE SURGERY N/A    Phreesia 08/07/2019   TOE SURGERY Bilateral    bone spurs    Family History  Problem Relation Age of Onset   Heart disease Father    Hyperlipidemia Father    Alcohol abuse Brother    Alcohol abuse Paternal Uncle    Breast cancer Maternal Aunt        70's   Colon cancer Neg Hx     Social History   Socioeconomic History   Marital status: Single    Spouse name: Not on file   Number of children: 0   Years of education: Not on file   Highest education level: Bachelor's degree (e.g., BA, AB, BS)  Occupational History   Not on file  Tobacco Use   Smoking status: Former    Packs/day: 1.00    Years: 1.00    Total pack years: 1.00    Types: Cigarettes    Quit date: 04/15/1996    Years since quitting: 25.8   Smokeless tobacco: Never   Tobacco comments:    started back again in 2016, smoked for about 6 mo and then quit  Vaping Use   Vaping Use: Never used  Substance and Sexual Activity   Alcohol use: No    Alcohol/week: 0.0 standard drinks of alcohol    Comment: quit 20 years   Drug use: Yes    Frequency: 14.0 times per week    Types: Marijuana    Comment: 20 yrs. ago- cocaine    Sexual activity: Not Currently  Other Topics Concern   Not on file  Social History Narrative   Not on file   Social Determinants of Health   Financial Resource Strain: Not on file  Food Insecurity: Food Insecurity Present (04/22/2018)   Hunger Vital Sign    Worried About Running Out of Food in the Last Year: Sometimes true    Ran Out of Food in the Last  Year: Not on file  Transportation Needs: No Transportation Needs (04/22/2018)   PRAPARE - Hydrologist (Medical): No    Lack of Transportation (Non-Medical): No  Physical Activity: Inactive (04/22/2018)   Exercise Vital Sign    Days of Exercise per Week: 0 days    Minutes of Exercise per Session: 0 min  Stress: Stress Concern Present (04/22/2018)  [place] Questionnaire    Feeling of Stress : Very much  Social Connections: Unknown (04/22/2018)   Social Connection and Isolation Panel [NHANES]    Frequency of Communication with Friends and Family: Not on file    Frequency of Social Gatherings with Friends and Family: Not on file    Attends Religious Services: 1 to 4 times per year    Active Member of [employer] or Organizations: Not on file    Attends Archivist Meetings: Not on file    Marital Status: Never married  Intimate Partner Violence: Not on file    Review of Systems  Constitutional:  Negative for chills and fever.  Respiratory:  Positive for cough and wheezing. Negative for shortness of breath.   All other systems reviewed and are negative.       Objective    BP (!) 142/89   Pulse 87   Temp 98.1 F (36.7 C) (Oral)   Resp 16   Wt 148 lb (67.1 kg)   SpO2 97%   BMI 27.96 kg/m   Physical Exam Vitals and nursing note reviewed.  Constitutional:      General: She is not in acute distress. Cardiovascular:     Rate and Rhythm: Normal rate and regular rhythm.  Pulmonary:     Effort: Pulmonary effort is normal. No respiratory distress.     Breath sounds: Wheezing and rhonchi present.  Musculoskeletal:     Cervical back: Normal range of motion and neck supple.  Neurological:     General: No focal deficit present.     Mental Status: She is alert and oriented to person, place, and time.  Psychiatric:        Mood and Affect: Affect normal. Mood is anxious and depressed.          Assessment & Plan:   1. Encounter for Medicare annual wellness exam   2. Moderate persistent asthma with exacerbation Prednisone and augmentin prescribed.   3. Situational depression 2/2 illness and impending death of mom. Clonazepam prescribed for prn usage    Return in 1 year (on 01/28/2023).   Becky Sax, MD

## 2022-01-27 NOTE — Progress Notes (Signed)
Subjective:   [name] is a 56 y.o. female who presents for Medicare Annual (Subsequent) preventive examination.  Review of Systems    Refer to Pcp       Objective:    Today's Vitals   01/27/22 0949  BP: (!) 146/93  Pulse: 87  Resp: 16  Temp: 98.1 F (36.7 C)  TempSrc: Oral  SpO2: 97%  Weight: 148 lb (67.1 kg)   Body mass index is 27.96 kg/m.     04/17/2020    9:57 AM 11/04/2017    7:41 AM 10/07/2016    1:00 PM 10/02/2016    9:41 AM 01/04/2016    8:00 PM 04/16/2015    2:09 PM  Advanced Directives  Does Patient Have a Medical Advance Directive? No No No No Yes No  Type of Theatre manager of Healthcare Power of Attorney in Chart?     Yes   Would patient like information on creating a medical advance directive? Yes (MAU/Ambulatory/Procedural Areas - Information given) No - Patient declined No - Patient declined No - Patient declined      Current Medications (verified) Outpatient Encounter Medications as of 01/27/2022  Medication Sig   albuterol (VENTOLIN HFA) 108 (90 Base) MCG/ACT inhaler INHALE 2 PUFFS BY MOUTH EVERY 4 TO 6 HOURS AS NEEDED   aspirin 81 MG tablet Take 81 mg by mouth daily.   Azelastine HCl 137 MCG/SPRAY SOLN *NEED OFFICE VISIT* PLACE 2 SPRAYS INTO BOTH NOSTRILS 2 (TWO) TIMES DAILY   buPROPion (WELLBUTRIN XL) 300 MG 24 hr tablet Take 1 tablet (300 mg total) by mouth daily.   Calcium Carb-Cholecalciferol (OYSTER SHELL CALCIUM W/D) 500-5 MG-MCG TABS TAKE 1 TABLET BY MOUTH THREE TIMES A DAY   calcium-vitamin D (OSCAL WITH D) 500-200 MG-UNIT tablet Take 1 tablet by mouth 3 (three) times daily.   fluticasone (FLONASE) 50 MCG/ACT nasal spray Place 2 sprays into both nostrils daily.   Fluticasone-Umeclidin-Vilant (TRELEGY ELLIPTA) 200-62.5-25 MCG/ACT AEPB TAKE 1 PUFF BY MOUTH EVERY DAY   gabapentin (NEURONTIN) 300 MG capsule Take 1 capsule by mouth 3 (three) times daily.   ipratropium (ATROVENT) 0.03 % nasal spray  PLACE 2 SPRAYS IN EACH NOSTRIL TWICE A DAY AS NEEDED FOR RUNNY NOSE/DRAINAGE DOWN THROAT   levocetirizine (XYZAL) 5 MG tablet TAKE 1 TABLET BY MOUTH EVERY DAY IN THE EVENING   montelukast (SINGULAIR) 10 MG tablet TAKE 1 TABLET BY MOUTH EVERYDAY AT BEDTIME   morphine (MSIR) 15 MG tablet Take 15 mg by mouth every 4 (four) hours as needed for severe pain.   Olopatadine HCl 0.2 % SOLN Place 1 drop in each eye once a day as needed for itchy watery eyes   pantoprazole (PROTONIX) 40 MG tablet TAKE 1 TABLET BY MOUTH EVERY DAY   simvastatin (ZOCOR) 20 MG tablet TAKE 1 TABLET BY MOUTH EVERY DAY AT 6PM   triamcinolone (NASACORT) 55 MCG/ACT AERO nasal inhaler    zonisamide (ZONEGRAN) 100 MG capsule Take 1 capsule (100 mg total) by mouth at bedtime.   Facility-Administered Encounter Medications as of 01/27/2022  Medication   mepolizumab (NUCALA) injection 100 mg    Allergies (verified) Other, Effexor [venlafaxine], and Lamictal [lamotrigine]   History: Past Medical History:  Diagnosis Date   Anxiety    Arthritis    neck, knees, shoulders   Asthma    Bipolar disorder (HCC)    currently feeling MANIC- 10/02/2016   Depression  GERD (gastroesophageal reflux disease)    Hip fracture ([facility]) 06/2019   History of blood transfusion    as a newborn    History of lump of left breast    Hyperlipidemia    Lumbar pseudoarthrosis    Motion sickness    cars   OSA (obstructive sleep apnea) 01/09/2016   can't afford CPAP   Personality disorder (HCC)    PONV (postoperative nausea and vomiting)    Post traumatic stress disorder (PTSD)    Substance abuse (HCC)    Synovial cyst    Past Surgical History:  Procedure Laterality Date   COLONOSCOPY WITH PROPOFOL N/A 11/04/2017   Procedure: COLONOSCOPY WITH PROPOFOL;  Surgeon: Toney Reil, MD;  Location[facility];  Service: Endoscopy;  Laterality: N/A;   ESOPHAGOGASTRODUODENOSCOPY (EGD) WITH PROPOFOL N/A 11/04/2017   Procedure:  ESOPHAGOGASTRODUODENOSCOPY (EGD) WITH PROPOFOL with biopsies;  Surgeon: Toney Reil, MD;  Location: [facility];  Service: Endoscopy;  Laterality: N/A;  sleep apnea   KNEE SURGERY Left    x5, post basketball injury   LUMBAR LAMINECTOMY/DECOMPRESSION MICRODISCECTOMY Left 01/22/2016   Procedure: Laminectomy for facet/synovial cyst - left - Lumbar four - lumbar five;  Surgeon: Julio Sicks, MD;  Location[facility];  Service: Neurosurgery;  Laterality: Left;  Laminectomy for facet/synovial cyst - left - Lumbar four - lumbar five   POLYPECTOMY N/A 11/04/2017   Procedure: POLYPECTOMY INTESTINAL;  Surgeon: Toney Reil, MD;  Location: [facility];  Service: Endoscopy;  Laterality: N/A;   SHOULDER SURGERY Left    x2   SPINE SURGERY N/A    Phreesia 08/07/2019   TOE SURGERY Bilateral    bone spurs   Family History  Problem Relation Age of Onset   Heart disease Father    Hyperlipidemia Father    Alcohol abuse Brother    Alcohol abuse Paternal Uncle    Breast cancer Maternal Aunt        70's   Colon cancer Neg Hx    Social History   Socioeconomic History   Marital status: Single    Spouse name: Not on file   Number of children: 0   Years of education: Not on file   Highest education level: Bachelor's degree (e.g., BA, AB, BS)  Occupational History   Not on file  Tobacco Use   Smoking status: Former    Packs/day: 1.00    Years: 1.00    Total pack years: 1.00    Types: Cigarettes    Quit date: 04/15/1996    Years since quitting: 25.8   Smokeless tobacco: Never   Tobacco comments:    started back again in 2016, smoked for about 6 mo and then quit  Vaping Use   Vaping Use: Never used  Substance and Sexual Activity   Alcohol use: No    Alcohol/week: 0.0 standard drinks of alcohol    Comment: quit 20 years   Drug use: Yes    Frequency: 14.0 times per week    Types: Marijuana    Comment: 20 yrs. ago- cocaine    Sexual activity: Not Currently  Other Topics  Concern   Not on file  Social History Narrative   Not on file   Social Determinants of Health   Financial Resource Strain: Not on file  Food Insecurity: Food Insecurity Present (04/22/2018)   Hunger Vital Sign    Worried About Running Out of Food in the Last Year: Sometimes true    Ran Out  of Food in the Last Year: Not on file  Transportation Needs: No Transportation Needs (04/22/2018)   PRAPARE - Administrator, Civil Service (Medical): No    Lack of Transportation (Non-Medical): No  Physical Activity: Inactive (04/22/2018)   Exercise Vital Sign    Days of Exercise per Week: 0 days    Minutes of Exercise per Session: 0 min  Stress: Stress Concern Present (04/22/2018)   [employer] of Occupational Health - Occupational Stress Questionnaire    Feeling of Stress : Very much  Social Connections: Unknown (04/22/2018)   Social Connection and Isolation Panel [NHANES]    Frequency of Communication with Friends and Family: Not on file    Frequency of Social Gatherings with Friends and Family: Not on file    Attends Religious Services: 1 to 4 times per year    Active Member of [employer] or Organizations: Not on file    Attends Banker Meetings: Not on file    Marital Status: Never married    Tobacco Counseling Counseling given: Not Answered Tobacco comments: started back again in 2016, smoked for about 6 mo and then quit   Clinical Intake:  Pre-visit preparation completed: No        Diabetes: No     Diabetic?n/a  Interpreter Needed?: No      Activities of Daily Living     No data to display           Patient Care Team: Georganna Skeans, MD as PCP - General (Family Medicine)  Indicate any recent Medical Services you may have received from other than [facility] providers in the past year (date may be approximate).     Assessment:   This is a routine wellness examination for [place].  Hearing/Vision screen No results found.  Dietary issues  and exercise activities discussed:     Goals Addressed   None   Depression Screen    01/27/2022    9:49 AM 01/27/2022    9:48 AM 09/17/2021    3:05 PM 02/13/2021    2:40 PM 12/19/2020   11:17 AM 04/17/2020    9:58 AM 06/22/2018    9:48 AM  PHQ 2/9 Scores  PHQ - 2 Score 0 0 1 2 3  0 0  PHQ- 9 Score 0 0 6 12 13       Fall Risk    04/17/2020    9:58 AM 08/10/2019    8:34 AM 05/21/2017    2:08 PM 09/12/2015    2:30 PM 09/12/2014   11:21 AM  Fall Risk   Falls in the past year? 1 0 Yes No Yes  Number falls in past yr: 1 0 1  1  Injury with Fall? 1 0   No  Risk for fall due to : History of fall(s) Medication side effect Impaired balance/gait    Follow up     Education provided    FALL RISK PREVENTION PERTAINING TO THE HOME:  Any stairs in or around the home? No  If so, are there any without handrails? No  Home free of loose throw rugs in walkways, pet beds, electrical cords, etc? No  Adequate lighting in your home to reduce risk of falls? Yes   ASSISTIVE DEVICES UTILIZED TO PREVENT FALLS:  Life alert? No  Use of a cane, walker or w/c? No  Grab bars in the bathroom? No  Shower chair or bench in shower? No  Elevated toilet seat or a handicapped toilet? No  TIMED UP AND GO:  Was the test performed? No .  Length of time to ambulate 10 feet:  sec.   Gait steady and fast with assistive device  Cognitive Function:    04/17/2020   10:00 AM  MMSE - Mini Mental State Exam  Orientation to time 5  Orientation to Place 5  Registration 3  Attention/ Calculation 5  Recall 3  Language- name 2 objects 2  Language- repeat 1  Language- follow 3 step command 3  Language- read & follow direction 1  Write a sentence 1  Copy design 1  Total score 30        Immunizations Immunization History  Administered Date(s) Administered   Influenza Split 01/13/2018   Influenza,inj,Quad PF,6+ Mos 12/18/2015, 02/09/2018, 12/10/2018, 04/05/2020, 02/13/2021, 11/06/2021   PFIZER(Purple  Top)SARS-COV-2 Vaccination 09/01/2019, 09/15/2019   PNEUMOCOCCAL CONJUGATE-20 02/13/2021   Pneumococcal Polysaccharide-23 05/30/2019   Tdap 12/10/2018   Zoster Recombinat (Shingrix) 02/13/2021, 11/06/2021    TDAP status: Up to date  Flu Vaccine status: Up to date  Pneumococcal vaccine status: Up to date  Covid-19 vaccine status: Information provided on how to obtain vaccines.   Qualifies for Shingles Vaccine? Yes   Zostavax completed Yes   Shingrix Completed?: Yes  Screening Tests Health Maintenance  Topic Date Due   COVID-19 Vaccine (3 - Pfizer risk series) 10/13/2019   Medicare Annual Wellness (AWV)  04/17/2021   MAMMOGRAM  07/26/2023   PAP SMEAR-Modifier  08/09/2024   COLONOSCOPY (Pts 45-71yrs Insurance coverage will need to be confirmed)  11/05/2027   INFLUENZA VACCINE  Completed   Hepatitis C Screening  Completed   HIV Screening  Completed   Zoster Vaccines- Shingrix  Completed   HPV VACCINES  Aged Out    Health Maintenance  Health Maintenance Due  Topic Date Due   COVID-19 Vaccine (3 - Pfizer risk series) 10/13/2019   Medicare Annual Wellness (AWV)  04/17/2021    Colorectal cancer screening: Type of screening: Colonoscopy. Completed 11/04/2017. Repeat every 5 years  Mammogram status: Completed  . Repeat every year 2    Lung Cancer Screening: (Low Dose CT Chest recommended if Age 53-80 years, 30 pack-year currently smoking OR have quit w/in 15years.) does qualify.   Lung Cancer Screening Referral: n/a  Additional Screening:  Hepatitis C Screening: does qualify; Completed 08/10/2019  Vision Screening: Recommended annual ophthalmology exams for early detection of glaucoma and other disorders of the eye. Is the patient up to date with their annual eye exam?  Yes  Who is the provider or what is the name of the office in which the patient attends annual eye exams? N/a If pt is not established with a provider, would they like to be referred to a provider to  establish care? Yes .   Dental Screening: Recommended annual dental exams for proper oral hygiene  Community Resource Referral / Chronic Care Management: CRR required this visit?  No   CCM required this visit?  No      Plan:     I have personally reviewed and noted the following in the patient's chart:   Medical and social history Use of alcohol, tobacco or illicit drugs  Current medications and supplements including opioid prescriptions. Patient is not currently taking opioid prescriptions. Functional ability and status Nutritional status Physical activity Advanced directives List of other physicians Hospitalizations, surgeries, and ER visits in previous 12 months Vitals Screenings to include cognitive, depression, and falls Referrals and appointments  In addition, I have reviewed  and discussed with patient certain preventive protocols, quality metrics, and best practice recommendations. A written personalized care plan for preventive services as well as general preventive health recommendations were provided to patient.     Kieth Brightly, RMA   01/27/2022   Nurse Notes:

## 2022-01-30 DIAGNOSIS — M5412 Radiculopathy, cervical region: Secondary | ICD-10-CM | POA: Diagnosis not present

## 2022-02-03 ENCOUNTER — Ambulatory Visit: Payer: Self-pay

## 2022-02-03 ENCOUNTER — Other Ambulatory Visit: Payer: Self-pay | Admitting: Family Medicine

## 2022-02-03 MED ORDER — PROMETHAZINE HCL 25 MG PO TABS: 25.0000 mg | ORAL_TABLET | Freq: Three times a day (TID) | ORAL | 0 refills | Status: AC | PRN

## 2022-02-03 NOTE — Telephone Encounter (Signed)
  Chief Complaint: Nausea/Vomiting Symptoms: emotional distress Frequency: Since Thursday Pertinent Negatives: Patient denies illness Disposition: '[]'$ ED /'[]'$ Urgent Care (no appt availability in office) / '[]'$ Appointment(In office/virtual)/ '[]'$  [place] [facility]/ '[]'$ Home Care/ '[]'$ Refused Recommended Disposition /'[]'$ [facility]/ '[x]'$  Follow-up with PCP Additional Notes: PT lost her mother Thursday. PT was her caregiver. Pt is feeling lost right now, and is very upset. PT was given medication by Dr. Redmond Pulling last week, pt has only taken 1 dose. She states it makes her feel funny and will not take anymore. Pt states that she has been unable to keep any food down since losing her mother. Pt has kept some liquids down.  PT would like something for nausea called in to the pharmacy.  Offered pt [facility] number. Pt already has this number and has an appt with psychiatrist on Friday.

## 2022-02-03 NOTE — Telephone Encounter (Signed)
Answer Assessment - Initial Assessment Questions 1. VOMITING SEVERITY: "How many times have you vomited in the past 24 hours?"     - MILD:  1 - 2 times/day    - MODERATE: 3 - 5 times/day, decreased oral intake without significant weight loss or symptoms of dehydration    - SEVERE: 6 or more times/day, vomits everything or nearly everything, with significant weight loss, symptoms of dehydration      severe 2. ONSET: "When did the vomiting begin?"      Thursday - Friday 3. FLUIDS: "What fluids or food have you vomited up today?" "Have you been able to keep any fluids down?"     yes 4. ABDOMEN PAIN: "Are your having any abdomen pain?" If Yes : "How bad is it and what does it feel like?" (e.g., crampy, dull, intermittent, constant)      no 5. DIARRHEA: "Is there any diarrhea?" If Yes, ask: "How many times today?"      Yes - while Mom was hospital 6. CONTACTS: "Is there anyone else in the family with the same symptoms?"      no 7. CAUSE: "What do you think is causing your vomiting?"     Losing mom 8. HYDRATION STATUS: "Any signs of dehydration?" (e.g., dry mouth [not only dry lips], too weak to stand) "When did you last urinate?"     This morning 9. OTHER SYMPTOMS: "Do you have any other symptoms?" (e.g., fever, headache, vertigo, vomiting blood or coffee grounds, recent head injury)     NO 10. PREGNANCY: "Is there any chance you are pregnant?" "When was your last menstrual period?"  Protocols used: Vomiting-A-AH

## 2022-02-06 ENCOUNTER — Other Ambulatory Visit: Payer: Self-pay | Admitting: Physician Assistant

## 2022-02-11 ENCOUNTER — Encounter (HOSPITAL_COMMUNITY): Payer: Self-pay | Admitting: Psychiatry

## 2022-02-11 ENCOUNTER — Ambulatory Visit (HOSPITAL_BASED_OUTPATIENT_CLINIC_OR_DEPARTMENT_OTHER): Payer: Medicare HMO | Admitting: Psychiatry

## 2022-02-11 DIAGNOSIS — F319 Bipolar disorder, unspecified: Secondary | ICD-10-CM | POA: Diagnosis not present

## 2022-02-11 DIAGNOSIS — M4802 Spinal stenosis, cervical region: Secondary | ICD-10-CM | POA: Diagnosis not present

## 2022-02-11 DIAGNOSIS — M5412 Radiculopathy, cervical region: Secondary | ICD-10-CM | POA: Diagnosis not present

## 2022-02-11 MED ORDER — BUPROPION HCL ER (XL) 300 MG PO TB24: 300.0000 mg | ORAL_TABLET | Freq: Every day | ORAL | 1 refills | Status: DC

## 2022-02-11 MED ORDER — ZONISAMIDE 100 MG PO CAPS: 100.0000 mg | ORAL_CAPSULE | Freq: Every day | ORAL | 1 refills | Status: DC

## 2022-02-11 NOTE — Progress Notes (Signed)
BH MD/PA/NP OP Progress Note  02/11/2022 12:32 PM [name]  MRN:  [id number]  Chief Complaint:   medication management  This was an in person/ face to face appointment .  Duration 25 minutes    HPI: [name] is a 56year old female . She was seeing Dr.Pucilowski for outpatient psychiatric management . He has retired ( last seen by Dr Mamie Nick. On 03/15/2020 ) .     [name] reports that her mother passed away recently . Wake/funeral were last week. She is saddened by this loss but also states her mother was suffering from worsening dementia and failing physical health and would have most likely continued to deteriorate and suffer if she had lived longer . She is thankful that mother did not need to go to a Nursing Home, which she had been worried about . She states that fortunately mother was able to stay at home with her husband and with her  until the last few days before she passed and to be with them on her final hours .  Following mother's death she has moved in with her father for the time being in order to provide assistance and support .  She denies having any suicidal ideations and presents future oriented . No significant neuro-vegetative symptoms reported or noted .  She has a remote history of substance use disorder , now abstinent/sober for many years.  She denies any increased cravings and remains highly motivated in abstinence.  With regards to medications she reports Zonegran, which she has taken for the last 2 + years , has been well tolerated, without side effects and with good response . Her preference is to continue this medication , which she states has worked " really well for me".  We reviewed termination issues . Writer will be leaving clinic in March 2024 after which patient may continue to be followed by another [facility] clinician . She expresses understanding .   Visit Diagnosis: Bipolar Disorder II by history   Past Psychiatric History: Reports history of Bipolar  Disorder diagnosis. Chart notes also indicate history of Borderline Personality Disorder .  Past history of Alcohol Use Disorder , reports has been sober /abstinent for several years .   Past Medical History:  Past Medical History:  Diagnosis Date   Anxiety    Arthritis    neck, knees, shoulders   Asthma    Bipolar disorder ([place])    currently feeling MANIC- 10/02/2016   Depression    GERD (gastroesophageal reflux disease)    Hip fracture ([place]) 06/2019   History of blood transfusion    as a newborn    History of lump of left breast    Hyperlipidemia    Lumbar pseudoarthrosis    Motion sickness    cars   OSA (obstructive sleep apnea) 01/09/2016   can't afford CPAP   Personality disorder (HCC)    PONV (postoperative nausea and vomiting)    Post traumatic stress disorder (PTSD)    Substance abuse ([place])    Synovial cyst     Past Surgical History:  Procedure Laterality Date   COLONOSCOPY WITH PROPOFOL N/A 11/04/2017   Procedure: COLONOSCOPY WITH PROPOFOL;  Surgeon: Lin Landsman, MD;  Location: [place];  Service: Endoscopy;  Laterality: N/A;   ESOPHAGOGASTRODUODENOSCOPY (EGD) WITH PROPOFOL N/A 11/04/2017   Procedure: ESOPHAGOGASTRODUODENOSCOPY (EGD) WITH PROPOFOL with biopsies;  Surgeon: Lin Landsman, MD;  Location: [place];  Service: Endoscopy;  Laterality: N/A;  sleep apnea   KNEE SURGERY Left    x5, post basketball injury   LUMBAR LAMINECTOMY/DECOMPRESSION MICRODISCECTOMY Left 01/22/2016   Procedure: Laminectomy for facet/synovial cyst - left - Lumbar four - lumbar five;  Surgeon: Earnie Larsson, MD;  Location: [place];  Service: Neurosurgery;  Laterality: Left;  Laminectomy for facet/synovial cyst - left - Lumbar four - lumbar five   POLYPECTOMY N/A 11/04/2017   Procedure: POLYPECTOMY INTESTINAL;  Surgeon: Lin Landsman, MD;  Location: [place];  Service: Endoscopy;  Laterality: N/A;   SHOULDER SURGERY Left    x2   SPINE SURGERY N/A     [employer] 08/07/2019   TOE SURGERY Bilateral    bone spurs    Family Psychiatric History: As below   Family History:  Family History  Problem Relation Age of Onset   Heart disease Father    Hyperlipidemia Father    Alcohol abuse Brother    Alcohol abuse Paternal Uncle    Breast cancer Maternal Aunt        70's   Colon cancer Neg Hx     Social History: Single,no children,  lives alone, on disability, focuses on taking care of her elderly parents  Social History   Socioeconomic History   Marital status: Single    Spouse name: Not on file   Number of children: 0   Years of education: Not on file   Highest education level: Bachelor's degree (e.g., BA, AB, BS)  Occupational History   Not on file  Tobacco Use   Smoking status: Former    Packs/day: 1.00    Years: 1.00    Total pack years: 1.00    Types: Cigarettes    Quit date: 04/15/1996    Years since quitting: 25.8   Smokeless tobacco: Never   Tobacco comments:    started back again in 2016, smoked for about 6 mo and then quit  Vaping Use   Vaping Use: Never used  Substance and Sexual Activity   Alcohol use: No    Alcohol/week: 0.0 standard drinks of alcohol    Comment: quit 20 years   Drug use: Yes    Frequency: 14.0 times per week    Types: Marijuana    Comment: 20 yrs. ago- cocaine    Sexual activity: Not Currently  Other Topics Concern   Not on file  Social History Narrative   Not on file   Social Determinants of Health   Financial Resource Strain: Not on file  Food Insecurity: Food Insecurity Present (04/22/2018)   Hunger Vital Sign    Worried About Running Out of Food in the Last Year: Sometimes true    Ran Out of Food in the Last Year: Not on file  Transportation Needs: No Transportation Needs (04/22/2018)   PRAPARE - Hydrologist (Medical): No    Lack of Transportation (Non-Medical): No  Physical Activity: Inactive (04/22/2018)   Exercise Vital Sign    Days of Exercise  per Week: 0 days    Minutes of Exercise per Session: 0 min  Stress: Stress Concern Present (04/22/2018)   [place]    Feeling of Stress : Very much  Social Connections: Unknown (04/22/2018)   Social Connection and Isolation Panel [NHANES]    Frequency of Communication with Friends and Family: Not on file    Frequency of Social Gatherings with Friends and Family: Not on file    Attends Religious Services:  1 to 4 times per year    Active Member of Clubs or Organizations: Not on file    Attends Archivist Meetings: Not on file    Marital Status: Never married    Allergies:  Allergies  Allergen Reactions   Other    Effexor [Venlafaxine] Other (See Comments)    UNSPECIFIED REACTION, headaches, felt funny, withdrawal with missed dose    Lamictal [Lamotrigine] Rash    Metabolic Disorder Labs: Lab Results  Component Value Date   HGBA1C 5.6 09/17/2021   No results found for: "PROLACTIN" Lab Results  Component Value Date   CHOL 188 09/17/2021   TRIG 169 (H) 09/17/2021   HDL 69 09/17/2021   CHOLHDL 2.7 09/17/2021   VLDL 22.2 06/22/2018   LDLCALC 90 09/17/2021   LDLCALC 79 08/10/2019   Lab Results  Component Value Date   TSH 1.480 09/17/2021   TSH 1.310 04/05/2020    Therapeutic Level Labs: No results found for: "LITHIUM" No results found for: "VALPROATE" No results found for: "CBMZ"  Current Medications: Current Outpatient Medications  Medication Sig Dispense Refill   promethazine (PHENERGAN) 25 MG tablet Take 1 tablet (25 mg total) by mouth every 8 (eight) hours as needed for nausea or vomiting. 20 tablet 0   albuterol (VENTOLIN HFA) 108 (90 Base) MCG/ACT inhaler INHALE 2 PUFFS BY MOUTH EVERY 4 TO 6 HOURS AS NEEDED 18 each 1   amoxicillin-clavulanate (AUGMENTIN) 875-125 MG tablet Take 1 tablet by mouth 2 (two) times daily. 20 tablet 0   aspirin 81 MG tablet Take 81 mg by mouth daily.      Azelastine HCl 137 MCG/SPRAY SOLN *NEED OFFICE VISIT* PLACE 2 SPRAYS INTO BOTH NOSTRILS 2 (TWO) TIMES DAILY 30 mL 3   buPROPion (WELLBUTRIN XL) 300 MG 24 hr tablet Take 1 tablet (300 mg total) by mouth daily. 30 tablet 1   Calcium Carb-Cholecalciferol (OYSTER SHELL CALCIUM W/D) 500-5 MG-MCG TABS TAKE 1 TABLET BY MOUTH THREE TIMES A DAY 90 tablet 6   calcium-vitamin D (OSCAL WITH D) 500-200 MG-UNIT tablet Take 1 tablet by mouth 3 (three) times daily. 90 tablet 6   clonazePAM (KLONOPIN) 0.5 MG tablet Take 1 tablet (0.5 mg total) by mouth 2 (two) times daily as needed for anxiety. 20 tablet 1   fluticasone (FLONASE) 50 MCG/ACT nasal spray Place 2 sprays into both nostrils daily. 16 g 6   Fluticasone-Umeclidin-Vilant (TRELEGY ELLIPTA) 200-62.5-25 MCG/ACT AEPB TAKE 1 PUFF BY MOUTH EVERY DAY 28 each 5   gabapentin (NEURONTIN) 300 MG capsule Take 1 capsule by mouth 3 (three) times daily.     ipratropium (ATROVENT) 0.03 % nasal spray PLACE 2 SPRAYS IN EACH NOSTRIL TWICE A DAY AS NEEDED FOR RUNNY NOSE/DRAINAGE DOWN THROAT 90 mL 1   levocetirizine (XYZAL) 5 MG tablet TAKE 1 TABLET BY MOUTH EVERY DAY IN THE EVENING 30 tablet 5   montelukast (SINGULAIR) 10 MG tablet TAKE 1 TABLET BY MOUTH EVERYDAY AT BEDTIME 30 tablet 5   morphine (MSIR) 15 MG tablet Take 15 mg by mouth every 4 (four) hours as needed for severe pain.     Olopatadine HCl 0.2 % SOLN Place 1 drop in each eye once a day as needed for itchy watery eyes 7.5 mL 5   pantoprazole (PROTONIX) 40 MG tablet TAKE 1 TABLET BY MOUTH EVERY DAY 30 tablet 5   predniSONE (DELTASONE) 50 MG tablet Take 1 tablet (50 mg total) by mouth daily with breakfast. 5 tablet 0  simvastatin (ZOCOR) 20 MG tablet TAKE 1 TABLET BY MOUTH EVERY DAY AT 6PM 90 tablet 0   triamcinolone (NASACORT) 55 MCG/ACT AERO nasal inhaler      zonisamide (ZONEGRAN) 100 MG capsule Take 1 capsule (100 mg total) by mouth at bedtime. 30 capsule 1   Current Facility-Administered Medications   Medication Dose Route Frequency Provider Last Rate Last Admin   mepolizumab (NUCALA) injection 100 mg  100 mg Subcutaneous Q28 days Kennith Gain, MD   100 mg at 01/15/22 1101     Musculoskeletal: Strength & Muscle Tone: within normal limits Gait & Station: normal Patient leans: N/A  Psychiatric Specialty Exam:  Review of Systems endorses history of chronic back pain - has had back surgery in the past   There were no vitals taken for this visit.There is no height or weight on file to calculate BMI.  General Appearance: well groomed, calm, no restlessness or agitation,  pleasant / cooperative   Eye Contact:  good eye contact   Speech:  Normal Rate  Volume:  Normal  Mood: reports being saddened by recent death of her mother, but denies worsening depression and presents with reactive affect  Affect: reactive, appropriate / reactive   Thought Process:  Linear/ intact   Orientation:  Full (Time, Place, and Person)  Thought Content: denies hallucinations , no delusions expressed, denies suicidal or self injurious thoughts  Suicidal Thoughts:  No denies and presents future oriented, currently focused on providing support for her father   Homicidal Thoughts:  No no homicidal ideations  Memory:   grossly intact   Judgement:  Other:  present   Insight:  Present  Psychomotor Activity:  NA  Concentration:  Concentration: Good  Recall:  Good  Fund of Knowledge: Good  Language: Good  Akathisia:  Negative  Handed:  Right  AIMS (if indicated): not done  Assets:  Communication Skills Housing Others:  close to her parents, whom she helps care for  ADL's:  Intact  Cognition: WNL  Sleep:   reports stable    Screenings: [place] Office Visit from 04/17/2020 in Primary Care at [facility]  Total Score (max 30 points ) 30      PHQ2-9    [place] Office Visit from 01/27/2022 in Primary Care at [facility] Visit from 09/17/2021 in Primary  Care at [facility] Visit from 02/13/2021 in Primary Care at [facility] Visit from 12/19/2020 in [place] at [facility] Visit from 04/17/2020 in [place] at [facility] Total Score 0 '1 2 3 '$ 0  PHQ-9 Total Score 0 '6 12 13 '$ --        Assessment and Plan:  56year old female, history of Bipolar Disorder , Borderline P. D  per chart , Cocaine Use Disorder /Alcohol Use Disorder in sustained remission,   Had been following with Dr Montel Culver for medication management , who has retired .   Janice Borbon reports her mother ( who had developed dementia and failing physical health) passed away recently . She describes being saddened by loss, but also states she is grateful mother was able to remain at home with her husband and family until the end of her life and did not have to go to a [place] , which she had feared might happen . Currently does not endorse worsening depression and remains abstinent ( remote history of SUD currently in sustained remission x many years ) .  She reports current medication regimen ( Wellbutrin/Zonegran) as effective and well tolerated .   Have reviewed termination issues, writer will be leaving clinic in March. 2024  Patient expresses understanding . After March patient may continue to follow with another [facility] clinician .     Continue Welllbutrin XL 300 mgr QDAY for depression ( 1 m, 1 refill)  Continue Zonisamide 100 mgr QHS for mood disorder  ( 1 m,  1 refill )   Will see in 4- 6 weeks , agrees to contact clinic sooner if any worsening or medication concern prior.  She has clinic phone number and also number for crisis hotline if needed     Janice Campus, MD 02/11/2022, 12:32 PM   Patient ID: [name], female   DOB: 04-30-1965, 56 y.o.   MRN: [id number]

## 2022-02-12 ENCOUNTER — Telehealth: Payer: Self-pay | Admitting: Allergy

## 2022-02-12 ENCOUNTER — Ambulatory Visit (INDEPENDENT_AMBULATORY_CARE_PROVIDER_SITE_OTHER): Payer: Medicare HMO

## 2022-02-12 DIAGNOSIS — J4550 Severe persistent asthma, uncomplicated: Secondary | ICD-10-CM

## 2022-02-12 MED ORDER — PANTOPRAZOLE SODIUM 40 MG PO TBEC: 40.0000 mg | DELAYED_RELEASE_TABLET | Freq: Every day | ORAL | 5 refills | Status: DC

## 2022-02-12 NOTE — Telephone Encounter (Signed)
Patient is requesting refill for pantoprazole.   [facility] [address] [place] [address]

## 2022-02-12 NOTE — Telephone Encounter (Signed)
Rx sent 

## 2022-02-14 ENCOUNTER — Ambulatory Visit (INDEPENDENT_AMBULATORY_CARE_PROVIDER_SITE_OTHER): Payer: Medicare HMO | Admitting: Allergy

## 2022-02-14 ENCOUNTER — Encounter: Payer: Self-pay | Admitting: Allergy

## 2022-02-14 ENCOUNTER — Other Ambulatory Visit: Payer: Self-pay

## 2022-02-14 VITALS — BP 124/78 | HR 89 | Temp 98.4°F | Resp 16 | Ht 61.0 in | Wt 137.6 lb

## 2022-02-14 DIAGNOSIS — H1013 Acute atopic conjunctivitis, bilateral: Secondary | ICD-10-CM | POA: Diagnosis not present

## 2022-02-14 DIAGNOSIS — J4540 Moderate persistent asthma, uncomplicated: Secondary | ICD-10-CM

## 2022-02-14 DIAGNOSIS — J3089 Other allergic rhinitis: Secondary | ICD-10-CM | POA: Diagnosis not present

## 2022-02-14 DIAGNOSIS — J329 Chronic sinusitis, unspecified: Secondary | ICD-10-CM

## 2022-02-14 DIAGNOSIS — K219 Gastro-esophageal reflux disease without esophagitis: Secondary | ICD-10-CM | POA: Diagnosis not present

## 2022-02-14 MED ORDER — OLOPATADINE HCL 0.2 % OP SOLN: OPHTHALMIC | 5 refills | Status: DC

## 2022-02-14 MED ORDER — BUDESONIDE 0.5 MG/2ML IN SUSP: 0.5000 mg | Freq: Two times a day (BID) | RESPIRATORY_TRACT | 5 refills | Status: DC

## 2022-02-14 MED ORDER — MONTELUKAST SODIUM 10 MG PO TABS: ORAL_TABLET | ORAL | 5 refills | Status: DC

## 2022-02-14 MED ORDER — FORMOTEROL FUMARATE 20 MCG/2ML IN NEBU: 20.0000 ug | INHALATION_SOLUTION | Freq: Two times a day (BID) | RESPIRATORY_TRACT | 5 refills | Status: DC

## 2022-02-14 MED ORDER — LEVOCETIRIZINE DIHYDROCHLORIDE 5 MG PO TABS: ORAL_TABLET | ORAL | 5 refills | Status: DC

## 2022-02-14 MED ORDER — IPRATROPIUM BROMIDE 0.03 % NA SOLN: NASAL | 5 refills | Status: DC

## 2022-02-14 MED ORDER — FLUTICASONE PROPIONATE 50 MCG/ACT NA SUSP: 2.0000 | Freq: Every day | NASAL | 5 refills | Status: DC

## 2022-02-14 NOTE — Progress Notes (Signed)
Follow-up Note  RE: [name] MRN: [id number] DOB: 28-Mar-1965 Date of Office Visit: 02/14/2022   History of present illness: [name] is a 56 y.o. female presenting today for follow-up of asthma, allergic rhinitis with conjunctivitis, recurrent sinusitis and reflux.  She was last seen in the office on 10/31/2021 by myself.  She states she has not been doing that great.  She states the week of Thanksgiving she had a sinus infection where she had nosebleeds as part of it which she states is unusual for her.  She went to use PCP and she did receive a "strong steroid" and antibiotic.  She states she is noticing with the Nucala now that the mucus in her chest seems to "move from one lung to the other" after the dosing.   She recalls in that when she was younger she use to use a nebulizer and it was more effective than inhaler medications.  She doesn't have a nebulizer at this time but would like to have access.  She is using trelegy 1 puff daily but she is feeling like it is not working like it use to.  She does continue monthly Nucala dosing at this time.  She takes singulair daily.  She will use albuterol as needed but does not report needing frequent use at this time.  She states her allergy symptoms are ok at this time.  She does use xyzal as needed as well as nasal atrovent for drainage and flonase for congestion.  For reflux control continues with pantoprazole.    Review of systems: Review of Systems  Constitutional: Negative.   HENT:  Positive for congestion, nosebleeds and sinus pain.        See HPI   Eyes: Negative.   Respiratory:  Positive for cough and shortness of breath.   Cardiovascular: Negative.   Gastrointestinal: Negative.   Musculoskeletal: Negative.   Skin: Negative.   Allergic/Immunologic: Negative.   Neurological: Negative.      All other systems negative unless noted above in HPI  Past medical/social/surgical/family history have been reviewed and are  unchanged unless specifically indicated below.  No changes  Medication List: Current Outpatient Medications  Medication Sig Dispense Refill   albuterol (VENTOLIN HFA) 108 (90 Base) MCG/ACT inhaler INHALE 2 PUFFS BY MOUTH EVERY 4 TO 6 HOURS AS NEEDED 18 each 1   aspirin 81 MG tablet Take 81 mg by mouth daily.     Azelastine HCl 137 MCG/SPRAY SOLN *NEED OFFICE VISIT* PLACE 2 SPRAYS INTO BOTH NOSTRILS 2 (TWO) TIMES DAILY 30 mL 3   budesonide (PULMICORT) 0.5 MG/2ML nebulizer solution Take 2 mLs (0.5 mg total) by nebulization in the morning and at bedtime. 120 mL 5   buPROPion (WELLBUTRIN XL) 300 MG 24 hr tablet Take 1 tablet (300 mg total) by mouth daily. 30 tablet 1   Calcium Carb-Cholecalciferol (OYSTER SHELL CALCIUM W/D) 500-5 MG-MCG TABS TAKE 1 TABLET BY MOUTH THREE TIMES A DAY 90 tablet 6   calcium-vitamin D (OSCAL WITH D) 500-200 MG-UNIT tablet Take 1 tablet by mouth 3 (three) times daily. 90 tablet 6   clonazePAM (KLONOPIN) 0.5 MG tablet Take 1 tablet (0.5 mg total) by mouth 2 (two) times daily as needed for anxiety. 20 tablet 1   cyclobenzaprine (FLEXERIL) 10 MG tablet Take 10 mg by mouth 2 (two) times daily as needed.     formoterol (PERFOROMIST) 20 MCG/2ML nebulizer solution Take 2 mLs (20 mcg total) by nebulization 2 (two) times daily.  120 mL 5   gabapentin (NEURONTIN) 300 MG capsule Take 1 capsule by mouth 3 (three) times daily.     morphine (MS CONTIN) 15 MG 12 hr tablet Take 15 mg by mouth every 12 (twelve) hours.     morphine (MSIR) 15 MG tablet Take 15 mg by mouth every 4 (four) hours as needed for severe pain.     pantoprazole (PROTONIX) 40 MG tablet Take 1 tablet (40 mg total) by mouth daily. 30 tablet 5   promethazine (PHENERGAN) 25 MG tablet Take 1 tablet (25 mg total) by mouth every 8 (eight) hours as needed for nausea or vomiting. 20 tablet 0   simvastatin (ZOCOR) 20 MG tablet TAKE 1 TABLET BY MOUTH EVERY DAY AT 6PM 90 tablet 0   triamcinolone (NASACORT) 55 MCG/ACT AERO nasal  inhaler      zonisamide (ZONEGRAN) 100 MG capsule Take 1 capsule (100 mg total) by mouth at bedtime. 30 capsule 1   amoxicillin-clavulanate (AUGMENTIN) 875-125 MG tablet Take 1 tablet by mouth 2 (two) times daily. (Patient not taking: Reported on 02/14/2022) 20 tablet 0   fluticasone (FLONASE) 50 MCG/ACT nasal spray Place 2 sprays into both nostrils daily. 16 g 5   ipratropium (ATROVENT) 0.03 % nasal spray 2 sprays each nostril twice a day as needed for runny nose/drainage down throat. 30 mL 5   levocetirizine (XYZAL) 5 MG tablet Take 1 tablet daily as needed. 30 tablet 5   montelukast (SINGULAIR) 10 MG tablet TAKE 1 TABLET BY MOUTH EVERYDAY AT BEDTIME 30 tablet 5   Olopatadine HCl 0.2 % SOLN Place 1 drop in each eye once a day as needed for itchy watery eyes 2.5 mL 5   predniSONE (DELTASONE) 50 MG tablet Take 1 tablet (50 mg total) by mouth daily with breakfast. (Patient not taking: Reported on 02/14/2022) 5 tablet 0   Current Facility-Administered Medications  Medication Dose Route Frequency Provider Last Rate Last Admin   mepolizumab (NUCALA) injection 100 mg  100 mg Subcutaneous Q28 days Kennith Gain, MD   100 mg at 02/12/22 1107     Known medication allergies: Allergies  Allergen Reactions   Other    Effexor [Venlafaxine] Other (See Comments)    UNSPECIFIED REACTION, headaches, felt funny, withdrawal with missed dose    Lamictal [Lamotrigine] Rash     Physical examination: Blood pressure 124/78, pulse 89, temperature 98.4 F (36.9 C), temperature source Temporal, resp. rate 16, height '5\' 1"'$  (1.549 m), weight 137 lb 9.6 oz (62.4 kg), SpO2 98 %.  General: Alert, interactive, in no acute distress. HEENT: PERRLA, TMs pearly gray, turbinates minimally edematous without discharge, post-pharynx non erythematous. Neck: Supple without lymphadenopathy. Lungs: Clear to auscultation without wheezing, rhonchi or rales. {no increased work of breathing. CV: Normal S1, S2 without  murmurs. Abdomen: Nondistended, nontender. Skin: Warm and dry, without lesions or rashes. Extremities:  No clubbing, cyanosis or edema. Neuro:   Grossly intact.  Diagnositics/Labs: Spirometry: FEV1: 2.97L 126%, FVC: 3.5L 116%, ratio consistent with nonobstructive pattern  Assessment and plan: Allergic rhinitis with conjunctivitis (grass pollen, weed pollen, molds, and mouse) Continue Singulair 10 mg once a day Continue Xyzal 5 mg once a day as needed Continue Pataday 1 drop each eye once a day as needed for itchy watery eyes Continue ipratropium bromide nasal spray using 2 sprays each nostril twice a day as needed for runny nose/drainage down throat Continue fluticasone nasal spray 2 sprays each nostril once a day for stuffy nose. May use saline  nasal rinse as needed for nasal symptoms. Use this prior to any medicated nasal spray.  Moderate persistent asthma  Hold Trelegy for now.   Will replace with nebulizer regimen as below Start Budesonide 0.'5mg'$  vials twice a day nebulized WITH Perforomist 1 vial nebulized twice a day.   Recommend performing flutter valve several times a day to help move mucus from airway.  You can get a flutter valve off [place].   Continue monthly Nucala injections.  Continue Singulair 10 mg as above May use albuterol 2 puffs every 4 hours as needed for cough, wheeze, tightness in chest, or shortness of breath. Also, may use albuterol 2 puffs 5-15 minutes prior to exercise  Recurrent sinusitis  Strep pneumonia titers are better but she still does not have great protection.  She now has 35% protective rate up from 22% previously.   Let see how you do with infections and if you still struggle with viral or bacterial illnesses then will have to discuss immunoglobulin replacement therapy  Reflux Use pantoprazole daily for reflux control Continue dietary and lifestyle modifications as below  Follow-up in 4-6 months or sooner if needed  I appreciate the opportunity  to take part in [name]'s care. Please do not hesitate to contact me with questions.  Sincerely,   Prudy Feeler, MD Allergy/Immunology [facility]

## 2022-02-14 NOTE — Patient Instructions (Addendum)
Allergic rhinitis with conjunctivitis (grass pollen, weed pollen, molds, and mouse) Continue Singulair 10 mg once a day Continue Xyzal 5 mg once a day as needed Continue Pataday 1 drop each eye once a day as needed for itchy watery eyes Continue ipratropium bromide nasal spray using 2 sprays each nostril twice a day as needed for runny nose/drainage down throat Continue fluticasone nasal spray 2 sprays each nostril once a day for stuffy nose. May use saline nasal rinse as needed for nasal symptoms. Use this prior to any medicated nasal spray.  Asthma  Hold Trelegy for now.   Will replace with nebulizer regimen as below Start Budesonide 0.'5mg'$  vials twice a day nebulized WITH Perforomist 1 vial nebulized twice a day.   Recommend performing flutter valve several times a day to help move mucus from airway.  You can get a flutter valve off [place].   Continue monthly Nucala injections.  Continue Singulair 10 mg as above May use albuterol 2 puffs every 4 hours as needed for cough, wheeze, tightness in chest, or shortness of breath. Also, may use albuterol 2 puffs 5-15 minutes prior to exercise  Recurrent sinusitis  Strep pneumonia titers are better but she still does not have great protection.  She now has 35% protective rate up from 22% previously.   Let see how you do with infections and if you still struggle with viral or bacterial illnesses then will have to discuss immunoglobulin replacement therapy  Reflux Use pantoprazole daily for reflux control Continue dietary and lifestyle modifications as below  Follow-up in 4-6 months or sooner if needed

## 2022-02-17 ENCOUNTER — Other Ambulatory Visit (HOSPITAL_COMMUNITY): Payer: Self-pay

## 2022-02-17 ENCOUNTER — Telehealth: Payer: Self-pay

## 2022-02-17 NOTE — Telephone Encounter (Signed)
PA request received via CMM through [facility] for Formoterol Fumarate 20MCG/2ML nebulizer solution  PA has been submitted and is awaiting determination.   Key: [id number] - PA Case ID: [id number]

## 2022-02-18 NOTE — Telephone Encounter (Signed)
PA for Formoterol Neb Sol has been DENIED:   This request was denied under your Medicare Part D benefit; however, coverage for the requested drug(s) has been approved under Medicare Part B. [employer] follows Medicare rules. The Medicare rule in Chapter 6 of the Prescription Drug Manual says that drugs covered under the Part B benefit cannot be covered under Part D.

## 2022-02-26 ENCOUNTER — Ambulatory Visit (INDEPENDENT_AMBULATORY_CARE_PROVIDER_SITE_OTHER): Payer: Medicare HMO | Admitting: Family

## 2022-02-26 ENCOUNTER — Encounter: Payer: Self-pay | Admitting: Family

## 2022-02-26 VITALS — BP 148/96 | HR 87 | Temp 98.3°F | Resp 16 | Ht 60.98 in | Wt 135.0 lb

## 2022-02-26 DIAGNOSIS — J329 Chronic sinusitis, unspecified: Secondary | ICD-10-CM

## 2022-02-26 DIAGNOSIS — R059 Cough, unspecified: Secondary | ICD-10-CM | POA: Diagnosis not present

## 2022-02-26 DIAGNOSIS — R053 Chronic cough: Secondary | ICD-10-CM | POA: Diagnosis not present

## 2022-02-26 MED ORDER — AMOXICILLIN-POT CLAVULANATE 875-125 MG PO TABS: 1.0000 | ORAL_TABLET | Freq: Two times a day (BID) | ORAL | 0 refills | Status: DC

## 2022-02-26 MED ORDER — PREDNISONE 50 MG PO TABS: 50.0000 mg | ORAL_TABLET | Freq: Every day | ORAL | 0 refills | Status: DC

## 2022-02-26 NOTE — Progress Notes (Signed)
Pt presents for sinus problem -states been going on for week

## 2022-02-26 NOTE — Progress Notes (Signed)
Patient ID: [name], female    DOB: [birth year]  MRN: [id number]  CC: Sinus Problem   Subjective: [name] is a 56 y.o. female who presents for sinus problem.  Her concerns today include:  Sinus issues x 1 week. Endorses cough. Denies red flag symptoms. Reports she was recently seen at the [facility] on 02/14/2022 but did not have symptoms at that time. Reports at her last appointment with her PCP [name], MD on 01/27/2022 she had similar sinus-related symptoms and she was prescribed a steroid and antibiotic which helped and would like to try the same again.  Patient Active Problem List   Diagnosis Date Noted   Hiatal hernia 04/21/2020   Other chest pain 04/07/2020   Acute non intractable tension-type headache 04/07/2020   Medial epicondylitis of left elbow 07/19/2019   Pain in left hip 07/19/2019   Asthma exacerbation 11/22/2018   GERD (gastroesophageal reflux disease) 06/22/2018   Cannabis use disorder, moderate, dependence ([place]) 04/22/2018   Insomnia 04/15/2018   Non-intractable vomiting    Elevated blood pressure reading without diagnosis of hypertension 06/13/2017   GAD (generalized anxiety disorder) 05/23/2017   OSA (obstructive sleep apnea) 01/09/2016   Asthma 11/21/2015   Chronic pain syndrome 11/21/2015   HLD (hyperlipidemia) 09/12/2014   Bipolar 1 disorder, depressed, partial remission ([place]) 09/12/2014     Current Outpatient Medications on File Prior to Visit  Medication Sig Dispense Refill   albuterol (VENTOLIN HFA) 108 (90 Base) MCG/ACT inhaler INHALE 2 PUFFS BY MOUTH EVERY 4 TO 6 HOURS AS NEEDED 18 each 1   aspirin 81 MG tablet Take 81 mg by mouth daily.     Azelastine HCl 137 MCG/SPRAY SOLN *NEED OFFICE VISIT* PLACE 2 SPRAYS INTO BOTH NOSTRILS 2 (TWO) TIMES DAILY 30 mL 3   budesonide (PULMICORT) 0.5 MG/2ML nebulizer solution Take 2 mLs (0.5 mg total) by nebulization in the morning and at bedtime. 120 mL 5   buPROPion (WELLBUTRIN XL)  300 MG 24 hr tablet Take 1 tablet (300 mg total) by mouth daily. 30 tablet 1   Calcium Carb-Cholecalciferol (OYSTER SHELL CALCIUM W/D) 500-5 MG-MCG TABS TAKE 1 TABLET BY MOUTH THREE TIMES A DAY 90 tablet 6   calcium-vitamin D (OSCAL WITH D) 500-200 MG-UNIT tablet Take 1 tablet by mouth 3 (three) times daily. 90 tablet 6   clonazePAM (KLONOPIN) 0.5 MG tablet Take 1 tablet (0.5 mg total) by mouth 2 (two) times daily as needed for anxiety. 20 tablet 1   cyclobenzaprine (FLEXERIL) 10 MG tablet Take 10 mg by mouth 2 (two) times daily as needed.     fluticasone (FLONASE) 50 MCG/ACT nasal spray Place 2 sprays into both nostrils daily. 16 g 5   formoterol (PERFOROMIST) 20 MCG/2ML nebulizer solution Take 2 mLs (20 mcg total) by nebulization 2 (two) times daily. 120 mL 5   gabapentin (NEURONTIN) 300 MG capsule Take 1 capsule by mouth 3 (three) times daily.     ipratropium (ATROVENT) 0.03 % nasal spray 2 sprays each nostril twice a day as needed for runny nose/drainage down throat. 30 mL 5   levocetirizine (XYZAL) 5 MG tablet Take 1 tablet daily as needed. 30 tablet 5   montelukast (SINGULAIR) 10 MG tablet TAKE 1 TABLET BY MOUTH EVERYDAY AT BEDTIME 30 tablet 5   morphine (MS CONTIN) 15 MG 12 hr tablet Take 15 mg by mouth every 12 (twelve) hours.     morphine (MSIR) 15 MG tablet Take 15 mg  by mouth every 4 (four) hours as needed for severe pain.     Olopatadine HCl 0.2 % SOLN Place 1 drop in each eye once a day as needed for itchy watery eyes 2.5 mL 5   pantoprazole (PROTONIX) 40 MG tablet Take 1 tablet (40 mg total) by mouth daily. 30 tablet 5   promethazine (PHENERGAN) 25 MG tablet Take 1 tablet (25 mg total) by mouth every 8 (eight) hours as needed for nausea or vomiting. 20 tablet 0   simvastatin (ZOCOR) 20 MG tablet TAKE 1 TABLET BY MOUTH EVERY DAY AT 6PM 90 tablet 0   triamcinolone (NASACORT) 55 MCG/ACT AERO nasal inhaler      zonisamide (ZONEGRAN) 100 MG capsule Take 1 capsule (100 mg total) by mouth at  bedtime. 30 capsule 1   Current Facility-Administered Medications on File Prior to Visit  Medication Dose Route Frequency Provider Last Rate Last Admin   mepolizumab (NUCALA) injection 100 mg  100 mg Subcutaneous Q28 days Kennith Gain, MD   100 mg at 02/12/22 1107    Allergies  Allergen Reactions   Other    Effexor [Venlafaxine] Other (See Comments)    UNSPECIFIED REACTION, headaches, felt funny, withdrawal with missed dose    Lamotrigine Rash and Other (See Comments)    Social History   Socioeconomic History   Marital status: Single    Spouse name: Not on file   Number of children: 0   Years of education: Not on file   Highest education level: Bachelor's degree (e.g., BA, AB, BS)  Occupational History   Not on file  Tobacco Use   Smoking status: Former    Packs/day: 1.00    Years: 1.00    Total pack years: 1.00    Types: Cigarettes    Quit date: 04/15/1996    Years since quitting: 25.8    Passive exposure: Past   Smokeless tobacco: Never   Tobacco comments:    started back again in 2016, smoked for about 6 mo and then quit  Vaping Use   Vaping Use: Never used  Substance and Sexual Activity   Alcohol use: No    Alcohol/week: 0.0 standard drinks of alcohol    Comment: quit 20 years   Drug use: Yes    Frequency: 14.0 times per week    Types: Marijuana    Comment: 20 yrs. ago- cocaine    Sexual activity: Not Currently  Other Topics Concern   Not on file  Social History Narrative   Not on file   Social Determinants of Health   Financial Resource Strain: Not on file  Food Insecurity: Food Insecurity Present (04/22/2018)   Hunger Vital Sign    Worried About Running Out of Food in the Last Year: Sometimes true    Ran Out of Food in the Last Year: Not on file  Transportation Needs: No Transportation Needs (04/22/2018)   PRAPARE - Hydrologist (Medical): No    Lack of Transportation (Non-Medical): No  Physical Activity:  Inactive (04/22/2018)   Exercise Vital Sign    Days of Exercise per Week: 0 days    Minutes of Exercise per Session: 0 min  Stress: Stress Concern Present (04/22/2018)   [place]    Feeling of Stress : Very much  Social Connections: Unknown (04/22/2018)   Social Connection and Isolation Panel [NHANES]    Frequency of Communication with Friends and Family: Not on  file    Frequency of Social Gatherings with Friends and Family: Not on file    Attends Religious Services: 1 to 4 times per year    Active Member of Clubs or Organizations: Not on file    Attends Archivist Meetings: Not on file    Marital Status: Never married  Intimate Partner Violence: Not on file    Family History  Problem Relation Age of Onset   Heart disease Father    Hyperlipidemia Father    Alcohol abuse Brother    Alcohol abuse Paternal Uncle    Breast cancer Maternal Aunt        70's   Colon cancer Neg Hx     Past Surgical History:  Procedure Laterality Date   COLONOSCOPY WITH PROPOFOL N/A 11/04/2017   Procedure: COLONOSCOPY WITH PROPOFOL;  Surgeon: Lin Landsman, MD;  Location: [facility];  Service: Endoscopy;  Laterality: N/A;   ESOPHAGOGASTRODUODENOSCOPY (EGD) WITH PROPOFOL N/A 11/04/2017   Procedure: ESOPHAGOGASTRODUODENOSCOPY (EGD) WITH PROPOFOL with biopsies;  Surgeon: Lin Landsman, MD;  Location: [facility];  Service: Endoscopy;  Laterality: N/A;  sleep apnea   KNEE SURGERY Left    x5, post basketball injury   LUMBAR LAMINECTOMY/DECOMPRESSION MICRODISCECTOMY Left 01/22/2016   Procedure: Laminectomy for facet/synovial cyst - left - Lumbar four - lumbar five;  Surgeon: Earnie Larsson, MD;  Location: [facility];  Service: Neurosurgery;  Laterality: Left;  Laminectomy for facet/synovial cyst - left - Lumbar four - lumbar five   POLYPECTOMY N/A 11/04/2017   Procedure: POLYPECTOMY INTESTINAL;  Surgeon: Lin Landsman, MD;  Location: [facility];  Service: Endoscopy;  Laterality: N/A;   SHOULDER SURGERY Left    x2   SPINE SURGERY N/A    [employer] 08/07/2019   TOE SURGERY Bilateral    bone spurs    ROS: Review of Systems Negative except as stated above  PHYSICAL EXAM: BP (!) 148/96   Pulse 87   Temp 98.3 F (36.8 C)   Resp 16   Ht 5' 0.98" (1.549 m)   Wt 135 lb (61.2 kg)   SpO2 98%   BMI 25.52 kg/m   Physical Exam HENT:     Head: Normocephalic and atraumatic.     Right Ear: Tympanic membrane, ear canal and external ear normal.     Left Ear: Tympanic membrane, ear canal and external ear normal.     Nose: Nose normal.     Mouth/Throat:     Mouth: Mucous membranes are moist.     Pharynx: Oropharynx is clear.  Eyes:     Extraocular Movements: Extraocular movements intact.     Conjunctiva/sclera: Conjunctivae normal.     Pupils: Pupils are equal, round, and reactive to light.  Cardiovascular:     Rate and Rhythm: Normal rate and regular rhythm.     Pulses: Normal pulses.     Heart sounds: Normal heart sounds.  Pulmonary:     Effort: Pulmonary effort is normal.     Breath sounds: Normal breath sounds.  Musculoskeletal:     Cervical back: Normal range of motion and neck supple.  Neurological:     General: No focal deficit present.     Mental Status: She is alert and oriented to person, place, and time.  Psychiatric:        Mood and Affect: Mood normal.        Behavior: Behavior normal.     ASSESSMENT AND PLAN: 1. Sinusitis, unspecified chronicity,  unspecified location 2. Cough, unspecified type - Patient today in office with no cardiopulmonary distress.  - Prednisone and Amoxicillin-Clavulanate as prescribed. Counseled on medication adherence.  - Respiratory panel result pending.  - Follow-up with primary provider as scheduled.  - COVID-19, Flu A+B and RSV - predniSONE (DELTASONE) 50 MG tablet; Take 1 tablet (50 mg total) by mouth daily with breakfast for 5  days.  Dispense: 5 tablet; Refill: 0 - amoxicillin-clavulanate (AUGMENTIN) 875-125 MG tablet; Take 1 tablet by mouth 2 (two) times daily.  Dispense: 20 tablet; Refill: 0    Patient was given the opportunity to ask questions.  Patient verbalized understanding of the plan and was able to repeat key elements of the plan. Patient was given clear instructions to go to Emergency Department or return to medical center if symptoms don't improve, worsen, or new problems develop.The patient verbalized understanding.   Orders Placed This Encounter  Procedures   COVID-19, Flu A+B and RSV     Requested Prescriptions   Signed Prescriptions Disp Refills   predniSONE (DELTASONE) 50 MG tablet 5 tablet 0    Sig: Take 1 tablet (50 mg total) by mouth daily with breakfast for 5 days.   amoxicillin-clavulanate (AUGMENTIN) 875-125 MG tablet 20 tablet 0    Sig: Take 1 tablet by mouth 2 (two) times daily.    Follow-up with primary provider as scheduled.   Camillia Herter, NP

## 2022-02-27 ENCOUNTER — Other Ambulatory Visit: Payer: Self-pay | Admitting: Allergy

## 2022-02-28 LAB — COVID-19, FLU A+B AND RSV
Influenza A, NAA: NOT DETECTED
Influenza B, NAA: NOT DETECTED
RSV, NAA: DETECTED — AB
SARS-CoV-2, NAA: NOT DETECTED

## 2022-03-02 ENCOUNTER — Telehealth: Payer: Medicare HMO | Admitting: Physician Assistant

## 2022-03-02 DIAGNOSIS — J210 Acute bronchiolitis due to respiratory syncytial virus: Secondary | ICD-10-CM | POA: Diagnosis not present

## 2022-03-02 MED ORDER — PREDNISONE 50 MG PO TABS: 50.0000 mg | ORAL_TABLET | Freq: Every day | ORAL | 0 refills | Status: DC

## 2022-03-02 NOTE — Patient Instructions (Signed)
[name], thank you for joining Mar Daring, PA-C for today's virtual visit.  While this provider is not your primary care provider (PCP), if your PCP is located in our provider database this encounter information will be shared with them immediately following your visit.   [facility] account gives you access to today's visit and all your visits, tests, and labs performed at [facility] " click here if you don't have a [facility] account or go to [email or web address]  Consent: (Patient) [name] provided verbal consent for this virtual visit at the beginning of the encounter.  Current Medications:  Current Outpatient Medications:    predniSONE (DELTASONE) 50 MG tablet, Take 1 tablet (50 mg total) by mouth daily with breakfast., Disp: 5 tablet, Rfl: 0   albuterol (VENTOLIN HFA) 108 (90 Base) MCG/ACT inhaler, INHALE 2 PUFFS BY MOUTH EVERY 4 TO 6 HOURS AS NEEDED, Disp: 18 each, Rfl: 1   amoxicillin-clavulanate (AUGMENTIN) 875-125 MG tablet, Take 1 tablet by mouth 2 (two) times daily., Disp: 20 tablet, Rfl: 0   aspirin 81 MG tablet, Take 81 mg by mouth daily., Disp: , Rfl:    Azelastine HCl 137 MCG/SPRAY SOLN, *NEED OFFICE VISIT* PLACE 2 SPRAYS INTO BOTH NOSTRILS 2 (TWO) TIMES DAILY, Disp: 30 mL, Rfl: 3   budesonide (PULMICORT) 0.5 MG/2ML nebulizer solution, Take 2 mLs (0.5 mg total) by nebulization in the morning and at bedtime., Disp: 120 mL, Rfl: 5   buPROPion (WELLBUTRIN XL) 300 MG 24 hr tablet, Take 1 tablet (300 mg total) by mouth daily., Disp: 30 tablet, Rfl: 1   Calcium Carb-Cholecalciferol (OYSTER SHELL CALCIUM W/D) 500-5 MG-MCG TABS, TAKE 1 TABLET BY MOUTH THREE TIMES A DAY, Disp: 90 tablet, Rfl: 6   calcium-vitamin D (OSCAL WITH D) 500-200 MG-UNIT tablet, Take 1 tablet by mouth 3 (three) times daily., Disp: 90 tablet, Rfl: 6   clonazePAM (KLONOPIN) 0.5 MG tablet, Take 1 tablet (0.5 mg total) by mouth 2 (two) times daily as needed  for anxiety., Disp: 20 tablet, Rfl: 1   cyclobenzaprine (FLEXERIL) 10 MG tablet, Take 10 mg by mouth 2 (two) times daily as needed., Disp: , Rfl:    fluticasone (FLONASE) 50 MCG/ACT nasal spray, Place 2 sprays into both nostrils daily., Disp: 16 g, Rfl: 5   formoterol (PERFOROMIST) 20 MCG/2ML nebulizer solution, Take 2 mLs (20 mcg total) by nebulization 2 (two) times daily., Disp: 120 mL, Rfl: 5   gabapentin (NEURONTIN) 300 MG capsule, Take 1 capsule by mouth 3 (three) times daily., Disp: , Rfl:    ipratropium (ATROVENT) 0.03 % nasal spray, 2 sprays each nostril twice a day as needed for runny nose/drainage down throat., Disp: 30 mL, Rfl: 5   levocetirizine (XYZAL) 5 MG tablet, Take 1 tablet daily as needed., Disp: 30 tablet, Rfl: 5   montelukast (SINGULAIR) 10 MG tablet, TAKE 1 TABLET BY MOUTH EVERYDAY AT BEDTIME, Disp: 30 tablet, Rfl: 5   morphine (MS CONTIN) 15 MG 12 hr tablet, Take 15 mg by mouth every 12 (twelve) hours., Disp: , Rfl:    morphine (MSIR) 15 MG tablet, Take 15 mg by mouth every 4 (four) hours as needed for severe pain., Disp: , Rfl:    Olopatadine HCl 0.2 % SOLN, Place 1 drop in each eye once a day as needed for itchy watery eyes, Disp: 2.5 mL, Rfl: 5   pantoprazole (PROTONIX) 40 MG tablet, Take 1 tablet (40 mg total) by mouth daily., Disp: 30  tablet, Rfl: 5   promethazine (PHENERGAN) 25 MG tablet, Take 1 tablet (25 mg total) by mouth every 8 (eight) hours as needed for nausea or vomiting., Disp: 20 tablet, Rfl: 0   simvastatin (ZOCOR) 20 MG tablet, TAKE 1 TABLET BY MOUTH EVERY DAY AT 6PM, Disp: 90 tablet, Rfl: 0   triamcinolone (NASACORT) 55 MCG/ACT AERO nasal inhaler, , Disp: , Rfl:    zonisamide (ZONEGRAN) 100 MG capsule, Take 1 capsule (100 mg total) by mouth at bedtime., Disp: 30 capsule, Rfl: 1  Current Facility-Administered Medications:    mepolizumab (NUCALA) injection 100 mg, 100 mg, Subcutaneous, Q28 days, Kennith Gain, MD, 100 mg at 02/12/22 1107    Medications ordered in this encounter:  Meds ordered this encounter  Medications   predniSONE (DELTASONE) 50 MG tablet    Sig: Take 1 tablet (50 mg total) by mouth daily with breakfast.    Dispense:  5 tablet    Refill:  0    Order Specific Question:   Supervising Provider    Answer:   Chase Picket [id number]     *If you need refills on other medications prior to your next appointment, please contact your pharmacy*  Follow-Up: Call back or seek an in-person evaluation if the symptoms worsen or if the condition fails to improve as anticipated.  [name] [phone]  Other Instructions  Respiratory Syncytial Virus Infection, Adult Respiratory syncytial virus (RSV) infection is an infection caused by RSV, a common virus. This virus is similar to viruses that cause the common cold and the flu. RSV infection can affect the nose, throat, windpipe, and lungs (respiratory system). When the infection is severe, it can cause: Bronchiolitis. This condition causes inflammation of the air passages in the lungs (bronchioles). Pneumonia. This condition causes inflammation of the air sacs in the lungs. RSV infection spreads from person to person (is contagious) through droplets from coughs and sneezes (respiratory secretions). This condition is rarely serious when it occurs in adults. What are the causes? This condition is caused by contact with RSV. This can happen by: Breathing respiratory secretions from someone who has the infection. Touching something that has been exposed to the virus (is contaminated) and then touching your mouth, nose, or eyes. Coming in close contact with someone who has this infection. This may happen if you: Hug or kiss. Shake or hold hands. Eat or drink using the same dishes or utensils. What increases the risk? The following factors may make you more likely to develop this condition: Being 69 years of age or older. Having certain health  conditions, including: A long-term (chronic) lung condition, such as chronic obstructive pulmonary disease (COPD). An immune system that is weak. This is your body's defense system. Down syndrome. Heart disease. Working in a hospital or other health care facility. Living in a long-term health care facility. RSV infections are most common from the months of November to April, but they can happen any time of year. What are the signs or symptoms? Symptoms of this condition include: Having a runny nose. Coughing. You may have a cough that brings up mucus (productive cough). Sneezing. Having a fever. Wanting to eat less than usual. Breathing loudly (wheezing). Having shortness of breath. Having fluid build up in the lungs (respiratory distress). How is this diagnosed? This condition may be diagnosed based on: Your symptoms. Your medical history. A physical exam. A chest X-ray to rule out pneumonia. Blood tests or tests of mucus from your lungs (  sputum). These tests may be done for older adults. A test of a sample of your respiratory secretions. How is this treated? In most cases, the RSV infection will go away after 1-2 weeks of caring for yourself at home.  Sometimes, RSV infection is severe and can cause bronchiolitis or pneumonia. If you develop one or both of these conditions, you may need to be treated in the hospital. You may be given: Oxygen therapy. Antiviral medicine. Medicines to open your bronchioles (bronchodilators). Follow these instructions at home: Medicines Take over-the-counter and prescription medicines only as told by your health care provider. If you were prescribed an antiviral medicine, take it as told by your health care provider. Do not stop using the antiviral even if you start to feel better. Lifestyle  Eat a healthy diet. Do not drink alcohol. Do not use any products that contain nicotine or tobacco, such as cigarettes, e-cigarettes, and chewing tobacco.  If you need help quitting, ask your health care provider. Rest at home until your symptoms go away. Return to your normal activities as told by your health care provider. Ask your health care provider what activities are safe for you. General instructions  Drink enough fluid to keep your urine pale yellow. Gargle with a salt-water mixture 3-4 times a day or as needed. To make a salt-water mixture, completely dissolve -1 tsp (3-6 g) of salt in 1 cup (237 mL) of warm water. Keep all follow-up visits as told by your health care provider. This is important. How is this prevented? To prevent catching and spreading RSV: Wash your hands often with soap and water for at least 20 seconds. If soap and water are not available, use hand sanitizer. Do not touch your face without first cleaning your hands. Stay home if you have symptoms of the common cold or the flu. Cover your nose and mouth when you cough or sneeze. Avoid large groups of people. Keep a safe distance of about 6 feet (1.8 m) from people who are coughing or sneezing. Where to find more information Centers for Disease Control and Prevention: http://www.wolf.info/ Contact a health care provider if: Your symptoms get worse or have not changed after 2 weeks. You have: A fever. Hot flashes, sweating, or chills that keep happening. A cough that brings up much more mucus than usual. A cough that brings up blood. You feel: Very tired (lethargic). Confused. Get help right away if: You have increased or severe trouble breathing. You lose consciousness. These symptoms may represent a serious problem that is an emergency. Do not wait to see if the symptoms will go away. Get medical help right away. Call your local emergency services (911 in the [place] Do not drive yourself to the hospital. Summary Respiratory syncytial virus (RSV) infection is an infection caused by RSV, a common virus. RSV infection can affect the nose, throat, windpipe, and lungs  (respiratory system). When the infection is severe, it can cause bronchiolitis or pneumonia. Take over-the-counter and prescription medicines only as told by your health care provider. Contact a health care provider if your symptoms get worse or have not changed after 2 weeks. This information is not intended to replace advice given to you by your health care provider. Make sure you discuss any questions you have with your health care provider. Document Revised: 12/01/2018 Document Reviewed: 12/08/2018 Elsevier Patient Education  2022 [employer].    If you have been instructed to have an in-person evaluation today at a local Urgent Care facility,  please use the link below. It will take you to a list of all of our available [facility], including address, phone number and hours of operation. Please do not delay care.  [facility]  If you or a family member do not have a primary care provider, use the link below to schedule a visit and establish care. When you choose a [facility] primary care physician or advanced practice provider, you gain a long-term partner in health. Find a Primary Care Provider  Learn more about [facility]'s in-office and virtual care options: [facility]

## 2022-03-02 NOTE — Progress Notes (Signed)
Virtual Visit Consent   [name], you are scheduled for a virtual visit with a [facility] provider today. Just as with appointments in the office, your consent must be obtained to participate. Your consent will be active for this visit and any virtual visit you may have with one of our providers in the next 365 days. If you have a MyChart account, a copy of this consent can be sent to you electronically.  As this is a virtual visit, video technology does not allow for your provider to perform a traditional examination. This may limit your provider's ability to fully assess your condition. If your provider identifies any concerns that need to be evaluated in person or the need to arrange testing (such as labs, EKG, etc.), we will make arrangements to do so. Although advances in technology are sophisticated, we cannot ensure that it will always work on either your end or our end. If the connection with a video visit is poor, the visit may have to be switched to a telephone visit. With either a video or telephone visit, we are not always able to ensure that we have a secure connection.  By engaging in this virtual visit, you consent to the provision of healthcare and authorize for your insurance to be billed (if applicable) for the services provided during this visit. Depending on your insurance coverage, you may receive a charge related to this service.  I need to obtain your verbal consent now. Are you willing to proceed with your visit today? [name] has provided verbal consent on 03/02/2022 for a virtual visit (video or telephone). Mar Daring, PA-C  Date: 03/02/2022 9:32 AM  Virtual Visit via Video Note   I, Mar Daring, connected with  [name]  ([id number], [birth year]) on 03/02/22 at  9:15 AM EST by a video-enabled telemedicine application and verified that I am speaking with the correct person using two identifiers.  Location: Patient: Virtual Visit Location  Patient: Home Provider: Virtual Visit Location Provider: Home Office   I discussed the limitations of evaluation and management by telemedicine and the availability of in person appointments. The patient expressed understanding and agreed to proceed.    History of Present Illness: [name] is a 56 y.o. who identifies as a female who was assigned female at birth, and is being seen today for RSV.    Problems:  Patient Active Problem List   Diagnosis Date Noted   Hiatal hernia 04/21/2020   Other chest pain 04/07/2020   Acute non intractable tension-type headache 04/07/2020   Medial epicondylitis of left elbow 07/19/2019   Pain in left hip 07/19/2019   Asthma exacerbation 11/22/2018   GERD (gastroesophageal reflux disease) 06/22/2018   Cannabis use disorder, moderate, dependence ([place]) 04/22/2018   Insomnia 04/15/2018   Non-intractable vomiting    Elevated blood pressure reading without diagnosis of hypertension 06/13/2017   GAD (generalized anxiety disorder) 05/23/2017   OSA (obstructive sleep apnea) 01/09/2016   Asthma 11/21/2015   Chronic pain syndrome 11/21/2015   HLD (hyperlipidemia) 09/12/2014   Bipolar 1 disorder, depressed, partial remission ([place]) 09/12/2014    Allergies:  Allergies  Allergen Reactions   Other    Effexor [Venlafaxine] Other (See Comments)    UNSPECIFIED REACTION, headaches, felt funny, withdrawal with missed dose    Lamotrigine Rash and Other (See Comments)   Medications:  Current Outpatient Medications:    predniSONE (DELTASONE) 50 MG tablet, Take 1 tablet (50 mg total) by  mouth daily with breakfast., Disp: 5 tablet, Rfl: 0   albuterol (VENTOLIN HFA) 108 (90 Base) MCG/ACT inhaler, INHALE 2 PUFFS BY MOUTH EVERY 4 TO 6 HOURS AS NEEDED, Disp: 18 each, Rfl: 1   amoxicillin-clavulanate (AUGMENTIN) 875-125 MG tablet, Take 1 tablet by mouth 2 (two) times daily., Disp: 20 tablet, Rfl: 0   aspirin 81 MG tablet, Take 81 mg by mouth daily., Disp: , Rfl:     Azelastine HCl 137 MCG/SPRAY SOLN, *NEED OFFICE VISIT* PLACE 2 SPRAYS INTO BOTH NOSTRILS 2 (TWO) TIMES DAILY, Disp: 30 mL, Rfl: 3   budesonide (PULMICORT) 0.5 MG/2ML nebulizer solution, Take 2 mLs (0.5 mg total) by nebulization in the morning and at bedtime., Disp: 120 mL, Rfl: 5   buPROPion (WELLBUTRIN XL) 300 MG 24 hr tablet, Take 1 tablet (300 mg total) by mouth daily., Disp: 30 tablet, Rfl: 1   Calcium Carb-Cholecalciferol (OYSTER SHELL CALCIUM W/D) 500-5 MG-MCG TABS, TAKE 1 TABLET BY MOUTH THREE TIMES A DAY, Disp: 90 tablet, Rfl: 6   calcium-vitamin D (OSCAL WITH D) 500-200 MG-UNIT tablet, Take 1 tablet by mouth 3 (three) times daily., Disp: 90 tablet, Rfl: 6   clonazePAM (KLONOPIN) 0.5 MG tablet, Take 1 tablet (0.5 mg total) by mouth 2 (two) times daily as needed for anxiety., Disp: 20 tablet, Rfl: 1   cyclobenzaprine (FLEXERIL) 10 MG tablet, Take 10 mg by mouth 2 (two) times daily as needed., Disp: , Rfl:    fluticasone (FLONASE) 50 MCG/ACT nasal spray, Place 2 sprays into both nostrils daily., Disp: 16 g, Rfl: 5   formoterol (PERFOROMIST) 20 MCG/2ML nebulizer solution, Take 2 mLs (20 mcg total) by nebulization 2 (two) times daily., Disp: 120 mL, Rfl: 5   gabapentin (NEURONTIN) 300 MG capsule, Take 1 capsule by mouth 3 (three) times daily., Disp: , Rfl:    ipratropium (ATROVENT) 0.03 % nasal spray, 2 sprays each nostril twice a day as needed for runny nose/drainage down throat., Disp: 30 mL, Rfl: 5   levocetirizine (XYZAL) 5 MG tablet, Take 1 tablet daily as needed., Disp: 30 tablet, Rfl: 5   montelukast (SINGULAIR) 10 MG tablet, TAKE 1 TABLET BY MOUTH EVERYDAY AT BEDTIME, Disp: 30 tablet, Rfl: 5   morphine (MS CONTIN) 15 MG 12 hr tablet, Take 15 mg by mouth every 12 (twelve) hours., Disp: , Rfl:    morphine (MSIR) 15 MG tablet, Take 15 mg by mouth every 4 (four) hours as needed for severe pain., Disp: , Rfl:    Olopatadine HCl 0.2 % SOLN, Place 1 drop in each eye once a day as needed for itchy  watery eyes, Disp: 2.5 mL, Rfl: 5   pantoprazole (PROTONIX) 40 MG tablet, Take 1 tablet (40 mg total) by mouth daily., Disp: 30 tablet, Rfl: 5   promethazine (PHENERGAN) 25 MG tablet, Take 1 tablet (25 mg total) by mouth every 8 (eight) hours as needed for nausea or vomiting., Disp: 20 tablet, Rfl: 0   simvastatin (ZOCOR) 20 MG tablet, TAKE 1 TABLET BY MOUTH EVERY DAY AT 6PM, Disp: 90 tablet, Rfl: 0   triamcinolone (NASACORT) 55 MCG/ACT AERO nasal inhaler, , Disp: , Rfl:    zonisamide (ZONEGRAN) 100 MG capsule, Take 1 capsule (100 mg total) by mouth at bedtime., Disp: 30 capsule, Rfl: 1  Current Facility-Administered Medications:    mepolizumab (NUCALA) injection 100 mg, 100 mg, Subcutaneous, Q28 days, Kennith Gain, MD, 100 mg at 02/12/22 1107  Observations/Objective: Patient is well-developed, well-nourished in no acute distress.  Resting comfortably at home.  Head is normocephalic, atraumatic.  No labored breathing.  Speech is clear and coherent with logical content.  Patient is alert and oriented at baseline.    Assessment and Plan: 1. RSV (acute bronchiolitis due to respiratory syncytial virus) - predniSONE (DELTASONE) 50 MG tablet; Take 1 tablet (50 mg total) by mouth daily with breakfast.  Dispense: 5 tablet; Refill: 0  - Continue at home nebulizer medications, Formoterol and Budesonide - Inhalers as prescribed - Prednisone '50mg'$  x 5 days - Seek in person evaluation if symptoms/breathing worsen  Follow Up Instructions: I discussed the assessment and treatment plan with the patient. The patient was provided an opportunity to ask questions and all were answered. The patient agreed with the plan and demonstrated an understanding of the instructions.  A copy of instructions were sent to the patient via MyChart unless otherwise noted below.    The patient was advised to call back or seek an in-person evaluation if the symptoms worsen or if the condition fails to improve  as anticipated.  Time:  I spent 18 minutes with the patient via telehealth technology discussing the above problems/concerns.    Mar Daring, PA-C

## 2022-03-06 DIAGNOSIS — M5412 Radiculopathy, cervical region: Secondary | ICD-10-CM | POA: Diagnosis not present

## 2022-03-08 DIAGNOSIS — M545 Low back pain, unspecified: Secondary | ICD-10-CM | POA: Diagnosis not present

## 2022-03-11 NOTE — Progress Notes (Unsigned)
Patient ID: [name], female    DOB: 06-06-1965  MRN: [id number]  CC: Left Knee Discomfort  Subjective: [name] is a 57 y.o. female who presents for left knee discomfort.   Her concerns today include:  left knee discomfort   Patient Active Problem List   Diagnosis Date Noted   Hiatal hernia 04/21/2020   Other chest pain 04/07/2020   Acute non intractable tension-type headache 04/07/2020   Medial epicondylitis of left elbow 07/19/2019   Pain in left hip 07/19/2019   Asthma exacerbation 11/22/2018   GERD (gastroesophageal reflux disease) 06/22/2018   Cannabis use disorder, moderate, dependence ([place]) 04/22/2018   Insomnia 04/15/2018   Non-intractable vomiting    Elevated blood pressure reading without diagnosis of hypertension 06/13/2017   GAD (generalized anxiety disorder) 05/23/2017   OSA (obstructive sleep apnea) 01/09/2016   Asthma 11/21/2015   Chronic pain syndrome 11/21/2015   HLD (hyperlipidemia) 09/12/2014   Bipolar 1 disorder, depressed, partial remission ([place]) 09/12/2014     Current Outpatient Medications on File Prior to Visit  Medication Sig Dispense Refill   albuterol (VENTOLIN HFA) 108 (90 Base) MCG/ACT inhaler INHALE 2 PUFFS BY MOUTH EVERY 4 TO 6 HOURS AS NEEDED 18 each 1   amoxicillin-clavulanate (AUGMENTIN) 875-125 MG tablet Take 1 tablet by mouth 2 (two) times daily. 20 tablet 0   aspirin 81 MG tablet Take 81 mg by mouth daily.     Azelastine HCl 137 MCG/SPRAY SOLN *NEED OFFICE VISIT* PLACE 2 SPRAYS INTO BOTH NOSTRILS 2 (TWO) TIMES DAILY 30 mL 3   budesonide (PULMICORT) 0.5 MG/2ML nebulizer solution Take 2 mLs (0.5 mg total) by nebulization in the morning and at bedtime. 120 mL 5   buPROPion (WELLBUTRIN XL) 300 MG 24 hr tablet Take 1 tablet (300 mg total) by mouth daily. 30 tablet 1   Calcium Carb-Cholecalciferol (OYSTER SHELL CALCIUM W/D) 500-5 MG-MCG TABS TAKE 1 TABLET BY MOUTH THREE TIMES A DAY 90 tablet 6   calcium-vitamin D (OSCAL WITH D)  500-200 MG-UNIT tablet Take 1 tablet by mouth 3 (three) times daily. 90 tablet 6   clonazePAM (KLONOPIN) 0.5 MG tablet Take 1 tablet (0.5 mg total) by mouth 2 (two) times daily as needed for anxiety. 20 tablet 1   cyclobenzaprine (FLEXERIL) 10 MG tablet Take 10 mg by mouth 2 (two) times daily as needed.     fluticasone (FLONASE) 50 MCG/ACT nasal spray Place 2 sprays into both nostrils daily. 16 g 5   formoterol (PERFOROMIST) 20 MCG/2ML nebulizer solution Take 2 mLs (20 mcg total) by nebulization 2 (two) times daily. 120 mL 5   gabapentin (NEURONTIN) 300 MG capsule Take 1 capsule by mouth 3 (three) times daily.     ipratropium (ATROVENT) 0.03 % nasal spray 2 sprays each nostril twice a day as needed for runny nose/drainage down throat. 30 mL 5   levocetirizine (XYZAL) 5 MG tablet Take 1 tablet daily as needed. 30 tablet 5   montelukast (SINGULAIR) 10 MG tablet TAKE 1 TABLET BY MOUTH EVERYDAY AT BEDTIME 30 tablet 5   morphine (MS CONTIN) 15 MG 12 hr tablet Take 15 mg by mouth every 12 (twelve) hours.     morphine (MSIR) 15 MG tablet Take 15 mg by mouth every 4 (four) hours as needed for severe pain.     Olopatadine HCl 0.2 % SOLN Place 1 drop in each eye once a day as needed for itchy watery eyes 2.5 mL 5   pantoprazole (PROTONIX) 40  MG tablet Take 1 tablet (40 mg total) by mouth daily. 30 tablet 5   predniSONE (DELTASONE) 50 MG tablet Take 1 tablet (50 mg total) by mouth daily with breakfast. 5 tablet 0   promethazine (PHENERGAN) 25 MG tablet Take 1 tablet (25 mg total) by mouth every 8 (eight) hours as needed for nausea or vomiting. 20 tablet 0   simvastatin (ZOCOR) 20 MG tablet TAKE 1 TABLET BY MOUTH EVERY DAY AT 6PM 90 tablet 0   triamcinolone (NASACORT) 55 MCG/ACT AERO nasal inhaler      zonisamide (ZONEGRAN) 100 MG capsule Take 1 capsule (100 mg total) by mouth at bedtime. 30 capsule 1   Current Facility-Administered Medications on File Prior to Visit  Medication Dose Route Frequency Provider  Last Rate Last Admin   mepolizumab (NUCALA) injection 100 mg  100 mg Subcutaneous Q28 days Kennith Gain, MD   100 mg at 02/12/22 1107    Allergies  Allergen Reactions   Other    Effexor [Venlafaxine] Other (See Comments)    UNSPECIFIED REACTION, headaches, felt funny, withdrawal with missed dose    Lamotrigine Rash and Other (See Comments)    Social History   Socioeconomic History   Marital status: Single    Spouse name: Not on file   Number of children: 0   Years of education: Not on file   Highest education level: Bachelor's degree (e.g., BA, AB, BS)  Occupational History   Not on file  Tobacco Use   Smoking status: Former    Packs/day: 1.00    Years: 1.00    Total pack years: 1.00    Types: Cigarettes    Quit date: 04/15/1996    Years since quitting: 25.9    Passive exposure: Past   Smokeless tobacco: Never   Tobacco comments:    started back again in 2016, smoked for about 6 mo and then quit  Vaping Use   Vaping Use: Never used  Substance and Sexual Activity   Alcohol use: No    Alcohol/week: 0.0 standard drinks of alcohol    Comment: quit 20 years   Drug use: Yes    Frequency: 14.0 times per week    Types: Marijuana    Comment: 20 yrs. ago- cocaine    Sexual activity: Not Currently  Other Topics Concern   Not on file  Social History Narrative   Not on file   Social Determinants of Health   Financial Resource Strain: Not on file  Food Insecurity: Food Insecurity Present (04/22/2018)   Hunger Vital Sign    Worried About Running Out of Food in the Last Year: Sometimes true    Ran Out of Food in the Last Year: Not on file  Transportation Needs: No Transportation Needs (04/22/2018)   PRAPARE - Hydrologist (Medical): No    Lack of Transportation (Non-Medical): No  Physical Activity: Inactive (04/22/2018)   Exercise Vital Sign    Days of Exercise per Week: 0 days    Minutes of Exercise per Session: 0 min  Stress:  Stress Concern Present (04/22/2018)   [place]    Feeling of Stress : Very much  Social Connections: Unknown (04/22/2018)   Social Connection and Isolation Panel [NHANES]    Frequency of Communication with Friends and Family: Not on file    Frequency of Social Gatherings with Friends and Family: Not on file    Attends Religious Services: 1  to 4 times per year    Active Member of Clubs or Organizations: Not on file    Attends Archivist Meetings: Not on file    Marital Status: Never married  Intimate Partner Violence: Not on file    Family History  Problem Relation Age of Onset   Heart disease Father    Hyperlipidemia Father    Alcohol abuse Brother    Alcohol abuse Paternal Uncle    Breast cancer Maternal Aunt        70's   Colon cancer Neg Hx     Past Surgical History:  Procedure Laterality Date   COLONOSCOPY WITH PROPOFOL N/A 11/04/2017   Procedure: COLONOSCOPY WITH PROPOFOL;  Surgeon: Lin Landsman, MD;  Location: [facility];  Service: Endoscopy;  Laterality: N/A;   ESOPHAGOGASTRODUODENOSCOPY (EGD) WITH PROPOFOL N/A 11/04/2017   Procedure: ESOPHAGOGASTRODUODENOSCOPY (EGD) WITH PROPOFOL with biopsies;  Surgeon: Lin Landsman, MD;  Location: [facility];  Service: Endoscopy;  Laterality: N/A;  sleep apnea   KNEE SURGERY Left    x5, post basketball injury   LUMBAR LAMINECTOMY/DECOMPRESSION MICRODISCECTOMY Left 01/22/2016   Procedure: Laminectomy for facet/synovial cyst - left - Lumbar four - lumbar five;  Surgeon: Earnie Larsson, MD;  Location: [facility];  Service: Neurosurgery;  Laterality: Left;  Laminectomy for facet/synovial cyst - left - Lumbar four - lumbar five   POLYPECTOMY N/A 11/04/2017   Procedure: POLYPECTOMY INTESTINAL;  Surgeon: Lin Landsman, MD;  Location: [facility];  Service: Endoscopy;  Laterality: N/A;   SHOULDER SURGERY Left    x2   SPINE SURGERY N/A     [employer] 08/07/2019   TOE SURGERY Bilateral    bone spurs    ROS: Review of Systems Negative except as stated above  PHYSICAL EXAM: There were no vitals taken for this visit.  Physical Exam  {female adult master:[id number]} {female adult master:[id number]}     Latest Ref Rng & Units 09/17/2021    3:42 PM 01/10/2021   11:33 AM 04/05/2020   10:53 AM  CMP  Glucose 70 - 99 mg/dL 81  96  91   BUN 6 - 24 mg/dL '8  7  13   '$ Creatinine 0.57 - 1.00 mg/dL 0.87  0.88  0.92   Sodium 134 - 144 mmol/L 139  139  137   Potassium 3.5 - 5.2 mmol/L 5.1  4.7  4.5   Chloride 96 - 106 mmol/L 100  104  100   CO2 20 - 29 mmol/L 24  24    Calcium 8.7 - 10.2 mg/dL 9.6  9.5  9.8   Total Protein 6.0 - 8.5 g/dL 7.2   7.0   Total Bilirubin 0.0 - 1.2 mg/dL <0.2   0.2   Alkaline Phos 44 - 121 IU/L 93   81   AST 0 - 40 IU/L 17   15   ALT 0 - 32 IU/L 17      Lipid Panel     Component Value Date/Time   CHOL 188 09/17/2021 1542   TRIG 169 (H) 09/17/2021 1542   HDL 69 09/17/2021 1542   CHOLHDL 2.7 09/17/2021 1542   CHOLHDL 3 06/22/2018 1437   VLDL 22.2 06/22/2018 1437   LDLCALC 90 09/17/2021 1542    CBC    Component Value Date/Time   WBC 7.3 09/17/2021 1542   WBC 6.2 06/22/2018 1437   RBC 4.70 09/17/2021 1542   RBC 3.96 06/22/2018 1437   HGB 14.3 09/17/2021 1542  HCT 42.1 09/17/2021 1542   PLT 403 09/17/2021 1542   MCV 90 09/17/2021 1542   MCH 30.4 09/17/2021 1542   MCH 30.9 10/28/2016 0659   MCHC 34.0 09/17/2021 1542   MCHC 34.9 06/22/2018 1437   RDW 13.5 09/17/2021 1542   LYMPHSABS 3.0 09/17/2021 1542   MONOABS 0.4 10/02/2016 0945   EOSABS 0.1 09/17/2021 1542   BASOSABS 0.0 09/17/2021 1542    ASSESSMENT AND PLAN:  There are no diagnoses linked to this encounter.   Patient was given the opportunity to ask questions.  Patient verbalized understanding of the plan and was able to repeat key elements of the plan. Patient was given clear instructions to go to Emergency Department or return to  medical center if symptoms don't improve, worsen, or new problems develop.The patient verbalized understanding.   No orders of the defined types were placed in this encounter.    Requested Prescriptions    No prescriptions requested or ordered in this encounter    No follow-ups on file.  Camillia Herter, NP

## 2022-03-12 ENCOUNTER — Ambulatory Visit (INDEPENDENT_AMBULATORY_CARE_PROVIDER_SITE_OTHER): Payer: Medicare HMO

## 2022-03-12 ENCOUNTER — Ambulatory Visit (INDEPENDENT_AMBULATORY_CARE_PROVIDER_SITE_OTHER): Payer: Medicare HMO | Admitting: Family

## 2022-03-12 VITALS — BP 105/101 | HR 85 | Temp 98.3°F | Resp 16 | Ht 60.98 in | Wt 135.0 lb

## 2022-03-12 DIAGNOSIS — Z9889 Other specified postprocedural states: Secondary | ICD-10-CM

## 2022-03-12 DIAGNOSIS — M25562 Pain in left knee: Secondary | ICD-10-CM | POA: Diagnosis not present

## 2022-03-12 DIAGNOSIS — J4550 Severe persistent asthma, uncomplicated: Secondary | ICD-10-CM

## 2022-03-12 MED ORDER — TRIAMCINOLONE ACETONIDE 40 MG/ML IJ SUSP: 40.0000 mg | Freq: Once | INTRAMUSCULAR | Status: DC

## 2022-03-12 MED ORDER — KETOROLAC TROMETHAMINE 30 MG/ML IJ SOLN: 30.0000 mg | Freq: Once | INTRAMUSCULAR | Status: DC

## 2022-03-12 NOTE — Progress Notes (Unsigned)
Pt presents for left knee pain -states was moving stuff around and sprained her back and hurt knee

## 2022-03-13 ENCOUNTER — Ambulatory Visit (INDEPENDENT_AMBULATORY_CARE_PROVIDER_SITE_OTHER): Payer: Medicare HMO

## 2022-03-13 ENCOUNTER — Telehealth: Payer: Self-pay | Admitting: Family Medicine

## 2022-03-13 ENCOUNTER — Ambulatory Visit (HOSPITAL_COMMUNITY)
Admission: EM | Admit: 2022-03-13 | Discharge: 2022-03-13 | Disposition: A | Discharge status: Home / Self Care | Payer: Medicare HMO | Attending: Urgent Care | Admitting: Urgent Care

## 2022-03-13 ENCOUNTER — Encounter (HOSPITAL_COMMUNITY): Payer: Self-pay

## 2022-03-13 DIAGNOSIS — M25562 Pain in left knee: Secondary | ICD-10-CM | POA: Diagnosis not present

## 2022-03-13 DIAGNOSIS — S76211A Strain of adductor muscle, fascia and tendon of right thigh, initial encounter: Secondary | ICD-10-CM

## 2022-03-13 DIAGNOSIS — Z9889 Other specified postprocedural states: Secondary | ICD-10-CM

## 2022-03-13 MED ORDER — DICLOFENAC SODIUM 75 MG PO TBEC: 75.0000 mg | DELAYED_RELEASE_TABLET | Freq: Two times a day (BID) | ORAL | 0 refills | Status: AC

## 2022-03-13 NOTE — Telephone Encounter (Signed)
Advised patient she could come in office today before 4:30pm or tomorrow during office hours.   Verbalized understanding.   She states she is [employer].

## 2022-03-13 NOTE — ED Provider Notes (Signed)
[place] [facility]    CSN: [id number] Arrival date & time: 03/13/22  1314      History   Chief Complaint Chief Complaint  Patient presents with   Groin Pain   Leg Pain    HPI [name] is a 57 y.o. female.   57 year old female who is on morphine for chronic pain due to numerous musculoskeletal issues, presents today due to concern of pain in her right groin.  She states that it started abruptly this morning.  She reports that she is currently packing up her house for the past 20 years and moving in with her father.  She denies a known injury, but states that the packing and moving process has been rough on her body.  She came in primarily to ensure that she does not have a blood clot.  She denies any edema to her extremities.  She denies any pain in her leg other than in her right groin.  She denies a hernia or a bulge.   Groin Pain  Leg Pain   Past Medical History:  Diagnosis Date   Anxiety    Arthritis    neck, knees, shoulders   Asthma    Bipolar disorder ([place])    currently feeling MANIC- 10/02/2016   Depression    GERD (gastroesophageal reflux disease)    Hip fracture ([place]) 06/2019   History of blood transfusion    as a newborn    History of lump of left breast    Hyperlipidemia    Lumbar pseudoarthrosis    Motion sickness    cars   OSA (obstructive sleep apnea) 01/09/2016   can't afford CPAP   Personality disorder (HCC)    PONV (postoperative nausea and vomiting)    Post traumatic stress disorder (PTSD)    Substance abuse ([place])    Synovial cyst     Patient Active Problem List   Diagnosis Date Noted   Hiatal hernia 04/21/2020   Other chest pain 04/07/2020   Acute non intractable tension-type headache 04/07/2020   Medial epicondylitis of left elbow 07/19/2019   Pain in left hip 07/19/2019   Asthma exacerbation 11/22/2018   GERD (gastroesophageal reflux disease) 06/22/2018   Cannabis use disorder, moderate, dependence ([place]) 04/22/2018    Insomnia 04/15/2018   Non-intractable vomiting    Elevated blood pressure reading without diagnosis of hypertension 06/13/2017   GAD (generalized anxiety disorder) 05/23/2017   OSA (obstructive sleep apnea) 01/09/2016   Asthma 11/21/2015   Chronic pain syndrome 11/21/2015   HLD (hyperlipidemia) 09/12/2014   Bipolar 1 disorder, depressed, partial remission ([place]) 09/12/2014    Past Surgical History:  Procedure Laterality Date   COLONOSCOPY WITH PROPOFOL N/A 11/04/2017   Procedure: COLONOSCOPY WITH PROPOFOL;  Surgeon: Lin Landsman, MD;  Location: [facility];  Service: Endoscopy;  Laterality: N/A;   ESOPHAGOGASTRODUODENOSCOPY (EGD) WITH PROPOFOL N/A 11/04/2017   Procedure: ESOPHAGOGASTRODUODENOSCOPY (EGD) WITH PROPOFOL with biopsies;  Surgeon: Lin Landsman, MD;  Location: [facility];  Service: Endoscopy;  Laterality: N/A;  sleep apnea   KNEE SURGERY Left    x5, post basketball injury   LUMBAR LAMINECTOMY/DECOMPRESSION MICRODISCECTOMY Left 01/22/2016   Procedure: Laminectomy for facet/synovial cyst - left - Lumbar four - lumbar five;  Surgeon: Earnie Larsson, MD;  Location: [facility];  Service: Neurosurgery;  Laterality: Left;  Laminectomy for facet/synovial cyst - left - Lumbar four - lumbar five   POLYPECTOMY N/A 11/04/2017   Procedure: POLYPECTOMY INTESTINAL;  Surgeon: Lin Landsman,  MD;  Location: [facility];  Service: Endoscopy;  Laterality: N/A;   SHOULDER SURGERY Left    x2   SPINE SURGERY N/A    Phreesia 08/07/2019   TOE SURGERY Bilateral    bone spurs    OB History   No obstetric history on file.      Home Medications    Prior to Admission medications   Medication Sig Start Date End Date Taking? Authorizing Provider  diclofenac (VOLTAREN) 75 MG EC tablet Take 1 tablet (75 mg total) by mouth 2 (two) times daily with a meal for 7 days. 03/13/22 03/20/22 Yes Henya Aguallo L, PA  albuterol (VENTOLIN HFA) 108 (90 Base) MCG/ACT inhaler INHALE 2  PUFFS BY MOUTH EVERY 4 TO 6 HOURS AS NEEDED 01/08/22   Kennith Gain, MD  amoxicillin-clavulanate (AUGMENTIN) 875-125 MG tablet Take 1 tablet by mouth 2 (two) times daily. 02/26/22   Camillia Herter, NP  aspirin 81 MG tablet Take 81 mg by mouth daily.    [provider]  Azelastine HCl 137 MCG/SPRAY SOLN *NEED OFFICE VISIT* PLACE 2 SPRAYS INTO BOTH NOSTRILS 2 (TWO) TIMES DAILY 02/27/22   Kennith Gain, MD  budesonide (PULMICORT) 0.5 MG/2ML nebulizer solution Take 2 mLs (0.5 mg total) by nebulization in the morning and at bedtime. 02/14/22   Kennith Gain, MD  buPROPion (WELLBUTRIN XL) 300 MG 24 hr tablet Take 1 tablet (300 mg total) by mouth daily. 02/11/22 08/10/22  Cobos, Myer Peer, MD  Calcium Carb-Cholecalciferol (OYSTER SHELL CALCIUM W/D) 500-5 MG-MCG TABS TAKE 1 TABLET BY MOUTH THREE TIMES A DAY 01/29/21   Aundra Dubin, PA-C  calcium-vitamin D (OSCAL WITH D) 500-200 MG-UNIT tablet Take 1 tablet by mouth 3 (three) times daily. 07/19/19   Leandrew Koyanagi, MD  clonazePAM (KLONOPIN) 0.5 MG tablet Take 1 tablet (0.5 mg total) by mouth 2 (two) times daily as needed for anxiety. 01/27/22   [name], MD  cyclobenzaprine (FLEXERIL) 10 MG tablet Take 10 mg by mouth 2 (two) times daily as needed. 02/06/22   [provider]  fluticasone (FLONASE) 50 MCG/ACT nasal spray Place 2 sprays into both nostrils daily. 02/14/22   Kennith Gain, MD  formoterol (PERFOROMIST) 20 MCG/2ML nebulizer solution Take 2 mLs (20 mcg total) by nebulization 2 (two) times daily. 02/14/22   Kennith Gain, MD  gabapentin (NEURONTIN) 300 MG capsule Take 1 capsule by mouth 3 (three) times daily. 02/25/17   [provider]  ipratropium (ATROVENT) 0.03 % nasal spray 2 sprays each nostril twice a day as needed for runny nose/drainage down throat. 02/14/22   Kennith Gain, MD  levocetirizine (XYZAL) 5 MG tablet Take 1 tablet daily as  needed. 02/14/22   Kennith Gain, MD  montelukast (SINGULAIR) 10 MG tablet TAKE 1 TABLET BY MOUTH EVERYDAY AT BEDTIME 02/14/22   Kennith Gain, MD  morphine (MS CONTIN) 15 MG 12 hr tablet Take 15 mg by mouth every 12 (twelve) hours. 01/30/22   [provider]  morphine (MSIR) 15 MG tablet Take 15 mg by mouth every 4 (four) hours as needed for severe pain.    [provider]  Olopatadine HCl 0.2 % SOLN Place 1 drop in each eye once a day as needed for itchy watery eyes 02/14/22   Kennith Gain, MD  pantoprazole (PROTONIX) 40 MG tablet Take 1 tablet (40 mg total) by mouth daily. 02/12/22   Kennith Gain, MD  predniSONE (DELTASONE) 50  MG tablet Take 1 tablet (50 mg total) by mouth daily with breakfast. 03/02/22   Mar Daring, PA-C  promethazine (PHENERGAN) 25 MG tablet Take 1 tablet (25 mg total) by mouth every 8 (eight) hours as needed for nausea or vomiting. 02/03/22   [name], MD  simvastatin (ZOCOR) 20 MG tablet TAKE 1 TABLET BY MOUTH EVERY DAY AT [facility] 01/10/22   [name], MD  triamcinolone (NASACORT) 55 MCG/ACT AERO nasal inhaler  12/01/19   [provider]  zonisamide (ZONEGRAN) 100 MG capsule Take 1 capsule (100 mg total) by mouth at bedtime. 02/11/22 08/10/22  Cobos, Myer Peer, MD    Family History Family History  Problem Relation Age of Onset   Heart disease Father    Hyperlipidemia Father    Alcohol abuse Brother    Alcohol abuse Paternal Uncle    Breast cancer Maternal Aunt        70's   Colon cancer Neg Hx     Social History Social History   Tobacco Use   Smoking status: Former    Packs/day: 1.00    Years: 1.00    Total pack years: 1.00    Types: Cigarettes    Quit date: 04/15/1996    Years since quitting: 25.9    Passive exposure: Past   Smokeless tobacco: Never   Tobacco comments:    started back again in 2016, smoked for about 6 mo and then quit  Vaping Use   Vaping Use:  Never used  Substance Use Topics   Alcohol use: No    Alcohol/week: 0.0 standard drinks of alcohol    Comment: quit 20 years   Drug use: Yes    Frequency: 14.0 times per week    Types: Marijuana    Comment: 20 yrs. ago- cocaine      Allergies   Other, Effexor [venlafaxine], and Lamotrigine   Review of Systems Review of Systems As per HPI  Physical Exam Triage Vital Signs ED Triage Vitals [03/13/22 1449]  Enc Vitals Group     BP (!) 161/99     Pulse Rate 88     Resp 18     Temp 97.8 F (36.6 C)     Temp Source Oral     SpO2 99 %     Weight      Height      Head Circumference      Peak Flow      Pain Score      Pain Loc      Pain Edu?      Excl. in [place]?    No data found.  Updated Vital Signs BP (!) 161/99 (BP Location: Left Arm)   Pulse 88   Temp 97.8 F (36.6 C) (Oral)   Resp 18   SpO2 99%   Visual Acuity Right Eye Distance:   Left Eye Distance:   Bilateral Distance:    Right Eye Near:   Left Eye Near:    Bilateral Near:     Physical Exam Vitals and nursing note reviewed.  Constitutional:      General: She is not in acute distress.    Appearance: Normal appearance. She is not ill-appearing, toxic-appearing or diaphoretic.  HENT:     Head: Normocephalic and atraumatic.  Cardiovascular:     Rate and Rhythm: Normal rate.     Pulses: Normal pulses.  Pulmonary:     Effort: Pulmonary effort is normal. No respiratory distress.  Musculoskeletal:  General: Tenderness (reproducible tenderness to direct palpation on inguinal ligament on R without bulge or hernia) present. No swelling, deformity or signs of injury. Normal range of motion.     Right lower leg: No edema.     Left lower leg: No edema.     Comments: Mild reproducible tenderness to superior gracilis on R No hernia. FROM hip  Skin:    General: Skin is warm and dry.     Capillary Refill: Capillary refill takes less than 2 seconds.     Findings: No bruising, erythema or rash.   Neurological:     General: No focal deficit present.     Mental Status: She is alert and oriented to person, place, and time.     Sensory: No sensory deficit.     Motor: No weakness.      UC Treatments / Results  Labs (all labs ordered are listed, but only abnormal results are displayed) Labs Reviewed - No data to display  EKG   Radiology No results found.  Procedures Procedures (including critical care time)  Medications Ordered in UC Medications - No data to display  Initial Impression / Assessment and Plan / UC Course  I have reviewed the triage vital signs and the nursing notes.  Pertinent labs & imaging results that were available during my care of the patient were reviewed by me and considered in my medical decision making (see chart for details).     Inguinal strain -patient has no signs or symptoms concerning for blood clot.  Will recommend diclofenac twice daily.  Rehab exercises given to patient.  Encouraged several days off from moving and lifting boxes.  Ice the area 3 times daily.  If you develop new or worsening pain, or swelling in the extremity, return to clinic for further evaluation.  Final Clinical Impressions(s) / UC Diagnoses   Final diagnoses:  Inguinal strain, right, initial encounter     Discharge Instructions      You likely have a groin strain Please alternate ice and heat to the affected area. Please take the diclofenac twice daily with food. Do the attached rehab exercises at home. If your symptoms persist, or if you start developing swelling in your legs, please had to the emergency room.     ED Prescriptions     Medication Sig Dispense Auth. Provider   diclofenac (VOLTAREN) 75 MG EC tablet Take 1 tablet (75 mg total) by mouth 2 (two) times daily with a meal for 7 days. 14 tablet Lynkin Saini L, [place]      PDMP not reviewed this encounter.   Chaney Malling, [place] 03/13/22 [phone]

## 2022-03-13 NOTE — ED Triage Notes (Signed)
Pt presents to the office for left sided groin pain for several days. Pt reports she does heavy lifting on a daily basis.

## 2022-03-13 NOTE — Telephone Encounter (Signed)
Copied from [place] [id number]. Topic: General - Inquiry >> Mar 13, 2022  9:33 AM Ludger Nutting wrote: Patient states that she was in the office yesterday (1/10)for knee pain and was not able to get an xray because the machine was down. Patient was told to follow up today(1/11). Checked with office and machine is still down. Patient states that she cannot go to Ortho until the xray is completed. Is there somewhere else we can send her to have xray done. Please advise.

## 2022-03-13 NOTE — Telephone Encounter (Signed)
Pt is calling back. She was advised that the xray machine is working. Pt states she is at the UC.

## 2022-03-13 NOTE — Telephone Encounter (Signed)
Called pt to advise that xray is available today. No ans lvm

## 2022-03-13 NOTE — Discharge Instructions (Addendum)
You likely have a groin strain Please alternate ice and heat to the affected area. Please take the diclofenac twice daily with food. Do the attached rehab exercises at home. If your symptoms persist, or if you start developing swelling in your legs, please had to the emergency room.

## 2022-03-18 DIAGNOSIS — M25562 Pain in left knee: Secondary | ICD-10-CM | POA: Diagnosis not present

## 2022-03-24 ENCOUNTER — Ambulatory Visit (HOSPITAL_BASED_OUTPATIENT_CLINIC_OR_DEPARTMENT_OTHER): Payer: Medicare HMO | Admitting: Psychiatry

## 2022-03-24 ENCOUNTER — Encounter (HOSPITAL_COMMUNITY): Payer: Self-pay | Admitting: Psychiatry

## 2022-03-24 DIAGNOSIS — F324 Major depressive disorder, single episode, in partial remission: Secondary | ICD-10-CM

## 2022-03-24 MED ORDER — ZONISAMIDE 100 MG PO CAPS: 100.0000 mg | ORAL_CAPSULE | Freq: Every day | ORAL | 1 refills | Status: DC

## 2022-03-24 MED ORDER — BUPROPION HCL ER (XL) 300 MG PO TB24: 300.0000 mg | ORAL_TABLET | Freq: Every day | ORAL | 1 refills | Status: DC
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
## 2022-03-25 ENCOUNTER — Other Ambulatory Visit (HOSPITAL_COMMUNITY): Payer: Self-pay | Admitting: *Deleted

## 2022-03-31 DIAGNOSIS — M1712 Unilateral primary osteoarthritis, left knee: Secondary | ICD-10-CM | POA: Diagnosis not present

## 2022-04-01 ENCOUNTER — Other Ambulatory Visit: Payer: Self-pay | Admitting: Family Medicine

## 2022-04-01 ENCOUNTER — Other Ambulatory Visit: Payer: Self-pay | Admitting: Allergy

## 2022-04-02 DIAGNOSIS — M4317 Spondylolisthesis, lumbosacral region: Secondary | ICD-10-CM | POA: Diagnosis not present

## 2022-04-02 DIAGNOSIS — M5412 Radiculopathy, cervical region: Secondary | ICD-10-CM | POA: Diagnosis not present

## 2022-04-07 ENCOUNTER — Telehealth: Payer: Self-pay | Admitting: Family Medicine

## 2022-04-07 NOTE — Telephone Encounter (Signed)
Copied from [place]. Topic: General - Other >> Apr 07, 2022  4:11 PM Santiya F wrote: Reason for CRM: Patient called in to check if Dr. Redmond Pulling received paperwork from Dr. Mardelle Matte the surgeon. Patient is due to have surgery later this month.

## 2022-04-09 ENCOUNTER — Ambulatory Visit (INDEPENDENT_AMBULATORY_CARE_PROVIDER_SITE_OTHER): Payer: Medicare HMO

## 2022-04-09 DIAGNOSIS — J4550 Severe persistent asthma, uncomplicated: Secondary | ICD-10-CM

## 2022-04-09 NOTE — Telephone Encounter (Signed)
I have attempted to contact this patient by phone with the following results: no answer.

## 2022-04-16 ENCOUNTER — Ambulatory Visit (INDEPENDENT_AMBULATORY_CARE_PROVIDER_SITE_OTHER): Payer: Medicare HMO | Admitting: Family Medicine

## 2022-04-16 DIAGNOSIS — Z01818 Encounter for other preprocedural examination: Secondary | ICD-10-CM

## 2022-04-16 NOTE — Progress Notes (Unsigned)
New Patient Office Visit  Subjective    Patient ID: [name], female    DOB: 22-Jul-1965  Age: 57 y.o. MRN: [id number]  CC: No chief complaint on file.   HPI [name] presents for surgical clearance for left knee replacement to be scheduled by Dr. Karin Lieu. Patient denies acute complaints or concerns.    Outpatient Encounter Medications as of 04/16/2022  Medication Sig   albuterol (VENTOLIN HFA) 108 (90 Base) MCG/ACT inhaler TAKE 2 PUFFS BY MOUTH EVERY 4 TO 6 HOURS AS NEEDED   amoxicillin-clavulanate (AUGMENTIN) 875-125 MG tablet Take 1 tablet by mouth 2 (two) times daily.   aspirin 81 MG tablet Take 81 mg by mouth daily.   Azelastine HCl 137 MCG/SPRAY SOLN *NEED OFFICE VISIT* PLACE 2 SPRAYS INTO BOTH NOSTRILS 2 (TWO) TIMES DAILY   budesonide (PULMICORT) 0.5 MG/2ML nebulizer solution Take 2 mLs (0.5 mg total) by nebulization in the morning and at bedtime.   buPROPion (WELLBUTRIN XL) 300 MG 24 hr tablet Take 1 tablet (300 mg total) by mouth daily.   Calcium Carb-Cholecalciferol (OYSTER SHELL CALCIUM W/D) 500-5 MG-MCG TABS TAKE 1 TABLET BY MOUTH THREE TIMES A DAY   calcium-vitamin D (OSCAL WITH D) 500-200 MG-UNIT tablet Take 1 tablet by mouth 3 (three) times daily.   clonazePAM (KLONOPIN) 0.5 MG tablet Take 1 tablet (0.5 mg total) by mouth 2 (two) times daily as needed for anxiety.   cyclobenzaprine (FLEXERIL) 10 MG tablet Take 10 mg by mouth 2 (two) times daily as needed.   fluticasone (FLONASE) 50 MCG/ACT nasal spray Place 2 sprays into both nostrils daily.   formoterol (PERFOROMIST) 20 MCG/2ML nebulizer solution Take 2 mLs (20 mcg total) by nebulization 2 (two) times daily.   gabapentin (NEURONTIN) 300 MG capsule Take 1 capsule by mouth 3 (three) times daily.   ipratropium (ATROVENT) 0.03 % nasal spray 2 sprays each nostril twice a day as needed for runny nose/drainage down throat.   levocetirizine (XYZAL) 5 MG tablet Take 1 tablet daily as needed.   montelukast (SINGULAIR)  10 MG tablet TAKE 1 TABLET BY MOUTH EVERYDAY AT BEDTIME   morphine (MS CONTIN) 15 MG 12 hr tablet Take 15 mg by mouth every 12 (twelve) hours.   morphine (MSIR) 15 MG tablet Take 15 mg by mouth every 4 (four) hours as needed for severe pain.   Olopatadine HCl 0.2 % SOLN Place 1 drop in each eye once a day as needed for itchy watery eyes   pantoprazole (PROTONIX) 40 MG tablet Take 1 tablet (40 mg total) by mouth daily.   predniSONE (DELTASONE) 50 MG tablet Take 1 tablet (50 mg total) by mouth daily with breakfast.   promethazine (PHENERGAN) 25 MG tablet Take 1 tablet (25 mg total) by mouth every 8 (eight) hours as needed for nausea or vomiting.   simvastatin (ZOCOR) 20 MG tablet TAKE 1 TABLET BY MOUTH EVERY DAY AT 6PM   triamcinolone (NASACORT) 55 MCG/ACT AERO nasal inhaler    zonisamide (ZONEGRAN) 100 MG capsule Take 1 capsule (100 mg total) by mouth at bedtime.   Facility-Administered Encounter Medications as of 04/16/2022  Medication   mepolizumab (NUCALA) injection 100 mg    Past Medical History:  Diagnosis Date   Anxiety    Arthritis    neck, knees, shoulders   Asthma    Bipolar disorder ([place])    currently feeling MANIC- 10/02/2016   Depression    GERD (gastroesophageal reflux disease)    Hip fracture ([place])  06/2019   History of blood transfusion    as a newborn    History of lump of left breast    Hyperlipidemia    Lumbar pseudoarthrosis    Motion sickness    cars   OSA (obstructive sleep apnea) 01/09/2016   can't afford CPAP   Personality disorder (HCC)    PONV (postoperative nausea and vomiting)    Post traumatic stress disorder (PTSD)    Substance abuse ([place])    Synovial cyst     Past Surgical History:  Procedure Laterality Date   COLONOSCOPY WITH PROPOFOL N/A 11/04/2017   Procedure: COLONOSCOPY WITH PROPOFOL;  Surgeon: Lin Landsman, MD;  Location: [place];  Service: Endoscopy;  Laterality: N/A;   ESOPHAGOGASTRODUODENOSCOPY (EGD) WITH PROPOFOL N/A  11/04/2017   Procedure: ESOPHAGOGASTRODUODENOSCOPY (EGD) WITH PROPOFOL with biopsies;  Surgeon: Lin Landsman, MD;  Location: [place];  Service: Endoscopy;  Laterality: N/A;  sleep apnea   KNEE SURGERY Left    x5, post basketball injury   LUMBAR LAMINECTOMY/DECOMPRESSION MICRODISCECTOMY Left 01/22/2016   Procedure: Laminectomy for facet/synovial cyst - left - Lumbar four - lumbar five;  Surgeon: Earnie Larsson, MD;  Location: [place];  Service: Neurosurgery;  Laterality: Left;  Laminectomy for facet/synovial cyst - left - Lumbar four - lumbar five   POLYPECTOMY N/A 11/04/2017   Procedure: POLYPECTOMY INTESTINAL;  Surgeon: Lin Landsman, MD;  Location: [place];  Service: Endoscopy;  Laterality: N/A;   SHOULDER SURGERY Left    x2   SPINE SURGERY N/A    Phreesia 08/07/2019   TOE SURGERY Bilateral    bone spurs    Family History  Problem Relation Age of Onset   Heart disease Father    Hyperlipidemia Father    Alcohol abuse Brother    Alcohol abuse Paternal Uncle    Breast cancer Maternal Aunt        70's   Colon cancer Neg Hx     Social History   Socioeconomic History   Marital status: Single    Spouse name: Not on file   Number of children: 0   Years of education: Not on file   Highest education level: Bachelor's degree (e.g., BA, AB, BS)  Occupational History   Not on file  Tobacco Use   Smoking status: Former    Packs/day: 1.00    Years: 1.00    Total pack years: 1.00    Types: Cigarettes    Quit date: 04/15/1996    Years since quitting: 26.0    Passive exposure: Past   Smokeless tobacco: Never   Tobacco comments:    started back again in 2016, smoked for about 6 mo and then quit  Vaping Use   Vaping Use: Never used  Substance and Sexual Activity   Alcohol use: No    Alcohol/week: 0.0 standard drinks of alcohol    Comment: quit 20 years   Drug use: Yes    Frequency: 14.0 times per week    Types: Marijuana    Comment: 20 yrs. ago-  cocaine    Sexual activity: Not Currently  Other Topics Concern   Not on file  Social History Narrative   Not on file   Social Determinants of Health   Financial Resource Strain: Not on file  Food Insecurity: Food Insecurity Present (04/22/2018)   Hunger Vital Sign    Worried About Running Out of Food in the Last Year: Sometimes true    Ran Out of  Food in the Last Year: Not on file  Transportation Needs: No Transportation Needs (04/22/2018)   PRAPARE - Hydrologist (Medical): No    Lack of Transportation (Non-Medical): No  Physical Activity: Inactive (04/22/2018)   Exercise Vital Sign    Days of Exercise per Week: 0 days    Minutes of Exercise per Session: 0 min  Stress: Stress Concern Present (04/22/2018)   [place]    Feeling of Stress : Very much  Social Connections: Unknown (04/22/2018)   Social Connection and Isolation Panel [NHANES]    Frequency of Communication with Friends and Family: Not on file    Frequency of Social Gatherings with Friends and Family: Not on file    Attends Religious Services: 1 to 4 times per year    Active Member of [employer] or Organizations: Not on file    Attends Archivist Meetings: Not on file    Marital Status: Never married  Intimate Partner Violence: Not on file    ROS      Objective    There were no vitals taken for this visit.  Physical Exam  {Labs (Optional):23779}    Assessment & Plan:   Problem List Items Addressed This Visit   None   No follow-ups on file.   Becky Sax, MD

## 2022-04-17 ENCOUNTER — Encounter: Payer: Self-pay | Admitting: Family Medicine

## 2022-04-24 ENCOUNTER — Other Ambulatory Visit: Payer: Self-pay | Admitting: Allergy

## 2022-04-24 DIAGNOSIS — J4550 Severe persistent asthma, uncomplicated: Secondary | ICD-10-CM

## 2022-05-06 ENCOUNTER — Encounter (HOSPITAL_COMMUNITY): Payer: Self-pay | Admitting: Psychiatry

## 2022-05-06 ENCOUNTER — Telehealth (HOSPITAL_BASED_OUTPATIENT_CLINIC_OR_DEPARTMENT_OTHER): Payer: Medicare HMO | Admitting: Psychiatry

## 2022-05-06 DIAGNOSIS — F324 Major depressive disorder, single episode, in partial remission: Secondary | ICD-10-CM | POA: Diagnosis not present

## 2022-05-06 MED ORDER — BUPROPION HCL ER (XL) 300 MG PO TB24: 300.0000 mg | ORAL_TABLET | Freq: Every day | ORAL | 2 refills | Status: DC

## 2022-05-06 MED ORDER — ZONISAMIDE 100 MG PO CAPS: 100.0000 mg | ORAL_CAPSULE | Freq: Every day | ORAL | 2 refills | Status: DC

## 2022-05-06 NOTE — Progress Notes (Signed)
[place] MD/PA/NP OP Progress Note  05/06/2022 8:03 AM [name]  MRN:  [id number]  Chief Complaint:   medication management  This was a phone based.  Limitations with this type of medication have been reviewed.  Patient's identity confirmed with 2 different identifiers. Location of parties [place] outpatient clinic  Duration 25 minutes    HPI: [name] is a 57year old female . She was seeing Dr.Pucilowski for outpatient psychiatric management . He has retired ( last seen by Dr Mamie Nick. On 03/15/2020 ) .    [name] reports that her father passed away in a motor vehicle accident in January. He had been medically ill and in hospice . She has had major losses recently- mother had passed away last year  .  She describes sadness related to her parents' deaths but that overall she is " doing all right". Currently she is focusing on estate issues and is currently living in her parent's home . She states she is eating well, sleep has been fair . No anhedonia endorsed - for example  reports enjoying her pet dog and also inherited her parents' pets . She denies having any suicidal or self injurious ideations at this time and presents future oriented , reporting it is not clear if she will be able to remain in parents house or move to another house as estate issues are sorted out. She does report some increased anxiety and having anxiety attacks following her father's death. No hallucinations or psychotic symptoms are noted or endorsed . Affect presents appropriate, reactive . [name] has a remote history of substance abuse , in sustained remission for years. She denies any relapses .   Provided support , empathy  With regards to medications , describes them as helpful and denies side effects. She was prescribed Klonopin 0.5 mgr BID PRN for anxiety by her PCP.  Denies sedation or drowsiness.   Have reviewed termination issues. She will continue to follow with another [facility] clinic  psychiatrist.   Patient expresses understanding .     Visit Diagnosis: Bipolar Disorder II by history   Past Psychiatric History: Reports history of Bipolar Disorder diagnosis. Chart notes also indicate history of Borderline Personality Disorder .  Past history of Alcohol Use Disorder , reports has been sober /abstinent for several years .   Past Medical History:  Past Medical History:  Diagnosis Date   Anxiety    Arthritis    neck, knees, shoulders   Asthma    Bipolar disorder ([place])    currently feeling MANIC- 10/02/2016   Depression    GERD (gastroesophageal reflux disease)    Hip fracture ([place]) 06/2019   History of blood transfusion    as a newborn    History of lump of left breast    Hyperlipidemia    Lumbar pseudoarthrosis    Motion sickness    cars   OSA (obstructive sleep apnea) 01/09/2016   can't afford CPAP   Personality disorder (HCC)    PONV (postoperative nausea and vomiting)    Post traumatic stress disorder (PTSD)    Substance abuse ([place])    Synovial cyst     Past Surgical History:  Procedure Laterality Date   COLONOSCOPY WITH PROPOFOL N/A 11/04/2017   Procedure: COLONOSCOPY WITH PROPOFOL;  Surgeon: Lin Landsman, MD;  Location: [facility];  Service: Endoscopy;  Laterality: N/A;   ESOPHAGOGASTRODUODENOSCOPY (EGD) WITH PROPOFOL N/A 11/04/2017   Procedure: ESOPHAGOGASTRODUODENOSCOPY (EGD) WITH PROPOFOL with biopsies;  Surgeon: Lin Landsman, MD;  Location: [facility];  Service: Endoscopy;  Laterality: N/A;  sleep apnea   KNEE SURGERY Left    x5, post basketball injury   LUMBAR LAMINECTOMY/DECOMPRESSION MICRODISCECTOMY Left 01/22/2016   Procedure: Laminectomy for facet/synovial cyst - left - Lumbar four - lumbar five;  Surgeon: Earnie Larsson, MD;  Location: [facility];  Service: Neurosurgery;  Laterality: Left;  Laminectomy for facet/synovial cyst - left - Lumbar four - lumbar five   POLYPECTOMY N/A 11/04/2017   Procedure: POLYPECTOMY INTESTINAL;   Surgeon: Lin Landsman, MD;  Location: [facility];  Service: Endoscopy;  Laterality: N/A;   SHOULDER SURGERY Left    x2   SPINE SURGERY N/A    [employer] 08/07/2019   TOE SURGERY Bilateral    bone spurs    Family Psychiatric History: As below   Family History:  Family History  Problem Relation Age of Onset   Heart disease Father    Hyperlipidemia Father    Alcohol abuse Brother    Alcohol abuse Paternal Uncle    Breast cancer Maternal Aunt        70's   Colon cancer Neg Hx     Social History: Single,no children,  lives alone, on disability, focuses on taking care of her elderly parents  Social History   Socioeconomic History   Marital status: Single    Spouse name: Not on file   Number of children: 0   Years of education: Not on file   Highest education level: Bachelor's degree (e.g., BA, AB, BS)  Occupational History   Not on file  Tobacco Use   Smoking status: Former    Packs/day: 1.00    Years: 1.00    Total pack years: 1.00    Types: Cigarettes    Quit date: 04/15/1996    Years since quitting: 26.0    Passive exposure: Past   Smokeless tobacco: Never   Tobacco comments:    started back again in 2016, smoked for about 6 mo and then quit  Vaping Use   Vaping Use: Never used  Substance and Sexual Activity   Alcohol use: No    Alcohol/week: 0.0 standard drinks of alcohol    Comment: quit 20 years   Drug use: Yes    Frequency: 14.0 times per week    Types: Marijuana    Comment: 20 yrs. ago- cocaine    Sexual activity: Not Currently  Other Topics Concern   Not on file  Social History Narrative   Not on file   Social Determinants of Health   Financial Resource Strain: Not on file  Food Insecurity: Food Insecurity Present (04/22/2018)   Hunger Vital Sign    Worried About Running Out of Food in the Last Year: Sometimes true    Ran Out of Food in the Last Year: Not on file  Transportation Needs: No Transportation Needs (04/22/2018)   PRAPARE  - Hydrologist (Medical): No    Lack of Transportation (Non-Medical): No  Physical Activity: Inactive (04/22/2018)   Exercise Vital Sign    Days of Exercise per Week: 0 days    Minutes of Exercise per Session: 0 min  Stress: Stress Concern Present (04/22/2018)   [place]    Feeling of Stress : Very much  Social Connections: Unknown (04/22/2018)   Social Connection and Isolation Panel [NHANES]    Frequency of Communication with Friends and Family:  Not on file    Frequency of Social Gatherings with Friends and Family: Not on file    Attends Religious Services: 1 to 4 times per year    Active Member of Clubs or Organizations: Not on file    Attends Archivist Meetings: Not on file    Marital Status: Never married    Allergies:  Allergies  Allergen Reactions   Other    Effexor [Venlafaxine] Other (See Comments)    UNSPECIFIED REACTION, headaches, felt funny, withdrawal with missed dose    Lamotrigine Rash and Other (See Comments)    Metabolic Disorder Labs: Lab Results  Component Value Date   HGBA1C 5.6 09/17/2021   No results found for: "PROLACTIN" Lab Results  Component Value Date   CHOL 188 09/17/2021   TRIG 169 (H) 09/17/2021   HDL 69 09/17/2021   CHOLHDL 2.7 09/17/2021   VLDL 22.2 06/22/2018   LDLCALC 90 09/17/2021   LDLCALC 79 08/10/2019   Lab Results  Component Value Date   TSH 1.480 09/17/2021   TSH 1.310 04/05/2020    Therapeutic Level Labs: No results found for: "LITHIUM" No results found for: "VALPROATE" No results found for: "CBMZ"  Current Medications: Current Outpatient Medications  Medication Sig Dispense Refill   albuterol (VENTOLIN HFA) 108 (90 Base) MCG/ACT inhaler TAKE 2 PUFFS BY MOUTH EVERY 4 TO 6 HOURS AS NEEDED 18 each 1   amoxicillin-clavulanate (AUGMENTIN) 875-125 MG tablet Take 1 tablet by mouth 2 (two) times daily. 20 tablet 0    aspirin 81 MG tablet Take 81 mg by mouth daily.     Azelastine HCl 137 MCG/SPRAY SOLN *NEED OFFICE VISIT* PLACE 2 SPRAYS INTO BOTH NOSTRILS 2 (TWO) TIMES DAILY 30 mL 3   budesonide (PULMICORT) 0.5 MG/2ML nebulizer solution Take 2 mLs (0.5 mg total) by nebulization in the morning and at bedtime. 120 mL 5   buPROPion (WELLBUTRIN XL) 300 MG 24 hr tablet Take 1 tablet (300 mg total) by mouth daily. 30 tablet 1   Calcium Carb-Cholecalciferol (OYSTER SHELL CALCIUM W/D) 500-5 MG-MCG TABS TAKE 1 TABLET BY MOUTH THREE TIMES A DAY 90 tablet 6   calcium-vitamin D (OSCAL WITH D) 500-200 MG-UNIT tablet Take 1 tablet by mouth 3 (three) times daily. 90 tablet 6   clonazePAM (KLONOPIN) 0.5 MG tablet Take 1 tablet (0.5 mg total) by mouth 2 (two) times daily as needed for anxiety. 20 tablet 1   cyclobenzaprine (FLEXERIL) 10 MG tablet Take 10 mg by mouth 2 (two) times daily as needed.     fluticasone (FLONASE) 50 MCG/ACT nasal spray Place 2 sprays into both nostrils daily. 16 g 5   formoterol (PERFOROMIST) 20 MCG/2ML nebulizer solution Take 2 mLs (20 mcg total) by nebulization 2 (two) times daily. 120 mL 5   gabapentin (NEURONTIN) 300 MG capsule Take 1 capsule by mouth 3 (three) times daily.     ipratropium (ATROVENT) 0.03 % nasal spray 2 sprays each nostril twice a day as needed for runny nose/drainage down throat. 30 mL 5   levocetirizine (XYZAL) 5 MG tablet Take 1 tablet daily as needed. 30 tablet 5   mepolizumab (NUCALA) 100 MG/ML SOSY INJECT '100MG'$  SUBCUTANEOUSLY EVERY 4 WEEKS 1 mL 11   montelukast (SINGULAIR) 10 MG tablet TAKE 1 TABLET BY MOUTH EVERYDAY AT BEDTIME 30 tablet 5   morphine (Janice CONTIN) 15 MG 12 hr tablet Take 15 mg by mouth every 12 (twelve) hours.     morphine (MSIR) 15 MG tablet Take 15  mg by mouth every 4 (four) hours as needed for severe pain.     Olopatadine HCl 0.2 % SOLN Place 1 drop in each eye once a day as needed for itchy watery eyes 2.5 mL 5   pantoprazole (PROTONIX) 40 MG tablet Take 1  tablet (40 mg total) by mouth daily. 30 tablet 5   predniSONE (DELTASONE) 50 MG tablet Take 1 tablet (50 mg total) by mouth daily with breakfast. 5 tablet 0   promethazine (PHENERGAN) 25 MG tablet Take 1 tablet (25 mg total) by mouth every 8 (eight) hours as needed for nausea or vomiting. 20 tablet 0   simvastatin (ZOCOR) 20 MG tablet TAKE 1 TABLET BY MOUTH EVERY DAY AT 6PM 90 tablet 0   triamcinolone (NASACORT) 55 MCG/ACT AERO nasal inhaler      zonisamide (ZONEGRAN) 100 MG capsule Take 1 capsule (100 mg total) by mouth at bedtime. 30 capsule 1   Current Facility-Administered Medications  Medication Dose Route Frequency Provider Last Rate Last Admin   mepolizumab (NUCALA) injection 100 mg  100 mg Subcutaneous Q28 days Kennith Gain, MD   100 mg at 04/09/22 1114     Musculoskeletal: Strength & Muscle Tone: within normal limits Gait & Station: normal Patient leans: N/A  Psychiatric Specialty Exam:  Review of Systems endorses history of chronic back pain -reports left knee pain  There were no vitals taken for this visit.There is no height or weight on file to calculate BMI.  General Appearance: NA   Eye Contact:  NA  Speech:  Normal Rate  Volume:  Normal  Mood: Reports sadness related to recent death of her father, but states mood has been stable   Affect: presents reactive   Thought Process:  Linear/ intact   Orientation:  Full (Time, Place, and Person)  Thought Content: denies hallucinations , no delusions expressed  Suicidal Thoughts:  No denies having suicidal or self injurious ideations  and presents future oriented  Homicidal Thoughts:  No no homicidal ideations   Memory:   grossly intact   Judgement:  Other:  present   Insight:  Present  Psychomotor Activity:  NA  Concentration:  Concentration: Good  Recall:  Good  Fund of Knowledge: Good  Language: Good  Akathisia:  Negative  Handed:  Right  AIMS (if indicated): not done  Assets:  Communication  Skills Housing Others:  close to her parents, whom she helps care for  ADL's:  Intact  Cognition: WNL  Sleep:   reports stable    Screenings: [place] Office Visit from 04/17/2020 in [facility]  Total Score (max 30 points ) 30      PHQ2-9    [place] Office Visit from 02/26/2022 in [place] at [facility] Office Visit from 01/27/2022 in [place] at [facility] Office Visit from 09/17/2021 in [facility] [place] Visit from 02/13/2021 in [place] at [facility] Office Visit from 12/19/2020 in [facility] at [facility]  PHQ-2 Total Score 0 0 '1 2 3  '$ PHQ-9 Total Score 0 0 '6 12 13      '$ Flowsheet Row ED from 03/13/2022 in [facility] No Risk        Assessment and Plan:  57year old female, history of Bipolar Disorder , Borderline P. D  per chart , Cocaine  Use Disorder /Alcohol Use Disorder in sustained remission,   Had been following with Dr Montel Culver for medication management , who has retired .   [name] reports that her father passed away in a motor vehicle accident in January. He had been medically ill and in hospice . (She has had major losses recently- mother had passed away last year ) .  She describes sadness related to her parents' deaths but that overall she is " doing all right" and does not currently endorse worsening depression. Denies SI, presents future oriented .  Currently she is focusing on estate issues and is currently living in her parent's home . She states she is eating well, sleep has been fair . She does report some increased anxiety and having anxiety attacks following her father's death. Affect presents appropriate, reactive . [name] has a remote history of substance abuse , in sustained remission for years. She denies any relapses .  Denies medication  side effects . She was prescribed Klonopin 0.5 mgr BID PRN for anxiety by her PCP.  Denies sedation or drowsiness.   Have reviewed termination issues. She will continue to follow with another [facility] clinic psychiatrist.   Patient expresses understanding .   Continue Welllbutrin XL 300 mgr QDAY for depression ( 1 m, 2 refills)  Continue Zonisamide 100 mgr QHS for mood disorder  ( 1 m,  2 refills )   Next appointment in about 4 weeks  , she agrees to contact clinic sooner if any worsening or medication concern prior.  She has clinic phone number and also number for crisis hotline if needed  She is planning to access grief counseling through [facility] ( her father was in hospice care ) .  We also reviewed referral to IOP .     Jenne Campus, MD 05/06/2022, 8:03 AM   Patient ID: [name], female   DOB: 1965/03/10, 57 y.o.   MRN: [id number]

## 2022-05-07 ENCOUNTER — Ambulatory Visit (INDEPENDENT_AMBULATORY_CARE_PROVIDER_SITE_OTHER): Payer: Medicare HMO

## 2022-05-07 DIAGNOSIS — J4550 Severe persistent asthma, uncomplicated: Secondary | ICD-10-CM | POA: Diagnosis not present

## 2022-05-12 ENCOUNTER — Telehealth: Payer: Self-pay | Admitting: Family Medicine

## 2022-05-12 ENCOUNTER — Telehealth (HOSPITAL_COMMUNITY): Payer: Self-pay | Admitting: Psychiatry

## 2022-05-12 NOTE — Telephone Encounter (Signed)
Pt is calling in because she is having some chest congestion ( no pain ) and wanted to know could Dr. Redmond Pulling prescribe her a steroid for it. Please advise.

## 2022-05-12 NOTE — Telephone Encounter (Signed)
D:  Dr. Parke Poisson referred pt back to [facility] since she didn't attend last summer.  A:  Re-oriented pt.  According to pt, she would prefer grief counseling since her parents died within a month of eachother last year.  Case manager will email pt [facility]'s phone # [phone]) per her request.  Encouraged pt to call the case manager back if she changes her mind about attending [facility] in the future.  Inform clinic nurse Selinda Eon M.)  R:  Pt receptive.

## 2022-05-12 NOTE — Telephone Encounter (Signed)
Please advise patient.  

## 2022-05-14 ENCOUNTER — Ambulatory Visit
Admission: EM | Admit: 2022-05-14 | Discharge: 2022-05-14 | Disposition: A | Discharge status: Home / Self Care | Payer: Medicare HMO | Attending: Internal Medicine | Admitting: Internal Medicine

## 2022-05-14 ENCOUNTER — Other Ambulatory Visit: Payer: Self-pay | Admitting: Family Medicine

## 2022-05-14 ENCOUNTER — Ambulatory Visit (INDEPENDENT_AMBULATORY_CARE_PROVIDER_SITE_OTHER): Payer: Medicare HMO

## 2022-05-14 DIAGNOSIS — R059 Cough, unspecified: Secondary | ICD-10-CM | POA: Diagnosis not present

## 2022-05-14 DIAGNOSIS — R053 Chronic cough: Secondary | ICD-10-CM

## 2022-05-14 MED ORDER — PREDNISONE 10 MG (21) PO TBPK: ORAL_TABLET | Freq: Every day | ORAL | 0 refills | Status: DC

## 2022-05-14 MED ORDER — BENZONATATE 100 MG PO CAPS: 100.0000 mg | ORAL_CAPSULE | Freq: Three times a day (TID) | ORAL | 0 refills | Status: DC | PRN

## 2022-05-14 NOTE — Telephone Encounter (Signed)
Patient called to make an appointment to see provider but know answer per scheduler.

## 2022-05-14 NOTE — ED Provider Notes (Signed)
[facility]    CSN: [id number] Arrival date & time: 05/14/22  1056      History   Chief Complaint Chief Complaint  Patient presents with   Cough    HPI [name] is a 57 y.o. female.   Patient presents with productive cough that has been present for about 1 to 2 weeks.  Patient reports that she has a chronic cough but it is worsened over the past 1 to 2 weeks.  She reports history of asthma which causes a chronic cough.  She states that she used to use her nebulizer treatment pretty consistently but her parents recently passed away which caused her not keep up with it. Has used albuterol inhaler since cough has increased with no improvement.  Denies any associated upper respiratory symptoms or fever.  Denies chest pain or shortness of breath.   Cough   Past Medical History:  Diagnosis Date   Anxiety    Arthritis    neck, knees, shoulders   Asthma    Bipolar disorder ([place])    currently feeling MANIC- 10/02/2016   Depression    GERD (gastroesophageal reflux disease)    Hip fracture ([place]) 06/2019   History of blood transfusion    as a newborn    History of lump of left breast    Hyperlipidemia    Lumbar pseudoarthrosis    Motion sickness    cars   OSA (obstructive sleep apnea) 01/09/2016   can't afford CPAP   Personality disorder (HCC)    PONV (postoperative nausea and vomiting)    Post traumatic stress disorder (PTSD)    Substance abuse ([place])    Synovial cyst     Patient Active Problem List   Diagnosis Date Noted   Hiatal hernia 04/21/2020   Other chest pain 04/07/2020   Acute non intractable tension-type headache 04/07/2020   Medial epicondylitis of left elbow 07/19/2019   Pain in left hip 07/19/2019   Asthma exacerbation 11/22/2018   GERD (gastroesophageal reflux disease) 06/22/2018   Cannabis use disorder, moderate, dependence ([place]) 04/22/2018   Insomnia 04/15/2018   Non-intractable vomiting    Elevated blood pressure reading without  diagnosis of hypertension 06/13/2017   GAD (generalized anxiety disorder) 05/23/2017   OSA (obstructive sleep apnea) 01/09/2016   Asthma 11/21/2015   Chronic pain syndrome 11/21/2015   HLD (hyperlipidemia) 09/12/2014   Bipolar 1 disorder, depressed, partial remission ([place]) 09/12/2014    Past Surgical History:  Procedure Laterality Date   COLONOSCOPY WITH PROPOFOL N/A 11/04/2017   Procedure: COLONOSCOPY WITH PROPOFOL;  Surgeon: Lin Landsman, MD;  Location: [facility];  Service: Endoscopy;  Laterality: N/A;   ESOPHAGOGASTRODUODENOSCOPY (EGD) WITH PROPOFOL N/A 11/04/2017   Procedure: ESOPHAGOGASTRODUODENOSCOPY (EGD) WITH PROPOFOL with biopsies;  Surgeon: Lin Landsman, MD;  Location: [facility];  Service: Endoscopy;  Laterality: N/A;  sleep apnea   KNEE SURGERY Left    x5, post basketball injury   LUMBAR LAMINECTOMY/DECOMPRESSION MICRODISCECTOMY Left 01/22/2016   Procedure: Laminectomy for facet/synovial cyst - left - Lumbar four - lumbar five;  Surgeon: Earnie Larsson, MD;  Location: [facility];  Service: Neurosurgery;  Laterality: Left;  Laminectomy for facet/synovial cyst - left - Lumbar four - lumbar five   POLYPECTOMY N/A 11/04/2017   Procedure: POLYPECTOMY INTESTINAL;  Surgeon: Lin Landsman, MD;  Location: [facility];  Service: Endoscopy;  Laterality: N/A;   SHOULDER SURGERY Left    x2   SPINE SURGERY N/A  [employer] 08/07/2019   TOE SURGERY Bilateral    bone spurs    OB History   No obstetric history on file.      Home Medications    Prior to Admission medications   Medication Sig Start Date End Date Taking? Authorizing Provider  benzonatate (TESSALON) 100 MG capsule Take 1 capsule (100 mg total) by mouth every 8 (eight) hours as needed for cough. 05/14/22  Yes , Hildred Alamin E, FNP  predniSONE (STERAPRED UNI-PAK 21 TAB) 10 MG (21) TBPK tablet Take by mouth daily. Take 6 tabs by mouth daily  for 2 days, then 5 tabs for 2 days, then 4 tabs for 2  days, then 3 tabs for 2 days, 2 tabs for 2 days, then 1 tab by mouth daily for 2 days 05/14/22  Yes , Hildred Alamin E, FNP  albuterol (VENTOLIN HFA) 108 (90 Base) MCG/ACT inhaler TAKE 2 PUFFS BY MOUTH EVERY 4 TO 6 HOURS AS NEEDED 04/01/22   Kennith Gain, MD  amoxicillin-clavulanate (AUGMENTIN) 875-125 MG tablet Take 1 tablet by mouth 2 (two) times daily. 02/26/22   Camillia Herter, NP  aspirin 81 MG tablet Take 81 mg by mouth daily.    [provider]  Azelastine HCl 137 MCG/SPRAY SOLN *NEED OFFICE VISIT* PLACE 2 SPRAYS INTO BOTH NOSTRILS 2 (TWO) TIMES DAILY 02/27/22   Kennith Gain, MD  budesonide (PULMICORT) 0.5 MG/2ML nebulizer solution Take 2 mLs (0.5 mg total) by nebulization in the morning and at bedtime. 02/14/22   Kennith Gain, MD  buPROPion (WELLBUTRIN XL) 300 MG 24 hr tablet Take 1 tablet (300 mg total) by mouth daily. 05/06/22 11/02/22  Cobos, Myer Peer, MD  Calcium Carb-Cholecalciferol (OYSTER SHELL CALCIUM W/D) 500-5 MG-MCG TABS TAKE 1 TABLET BY MOUTH THREE TIMES A DAY 01/29/21   Aundra Dubin, PA-C  calcium-vitamin D (OSCAL WITH D) 500-200 MG-UNIT tablet Take 1 tablet by mouth 3 (three) times daily. 07/19/19   Leandrew Koyanagi, MD  clonazePAM (KLONOPIN) 0.5 MG tablet Take 1 tablet (0.5 mg total) by mouth 2 (two) times daily as needed for anxiety. 01/27/22   [name], MD  cyclobenzaprine (FLEXERIL) 10 MG tablet Take 10 mg by mouth 2 (two) times daily as needed. 02/06/22   [provider]  fluticasone (FLONASE) 50 MCG/ACT nasal spray Place 2 sprays into both nostrils daily. 02/14/22   Kennith Gain, MD  formoterol (PERFOROMIST) 20 MCG/2ML nebulizer solution Take 2 mLs (20 mcg total) by nebulization 2 (two) times daily. 02/14/22   Kennith Gain, MD  gabapentin (NEURONTIN) 300 MG capsule Take 1 capsule by mouth 3 (three) times daily. 02/25/17   [provider]  ipratropium (ATROVENT) 0.03 % nasal spray 2  sprays each nostril twice a day as needed for runny nose/drainage down throat. 02/14/22   Kennith Gain, MD  levocetirizine (XYZAL) 5 MG tablet Take 1 tablet daily as needed. 02/14/22   Kennith Gain, MD  mepolizumab (NUCALA) 100 MG/ML SOSY INJECT '100MG'$  SUBCUTANEOUSLY EVERY 4 WEEKS 04/24/22   Kennith Gain, MD  montelukast (SINGULAIR) 10 MG tablet TAKE 1 TABLET BY MOUTH EVERYDAY AT BEDTIME 02/14/22   Kennith Gain, MD  morphine (MS CONTIN) 15 MG 12 hr tablet Take 15 mg by mouth every 12 (twelve) hours. 01/30/22   [provider]  morphine (MSIR) 15 MG tablet Take 15 mg by mouth every 4 (four) hours as needed for severe pain.    [provider]  Olopatadine HCl  0.2 % SOLN Place 1 drop in each eye once a day as needed for itchy watery eyes 02/14/22   Kennith Gain, MD  pantoprazole (PROTONIX) 40 MG tablet Take 1 tablet (40 mg total) by mouth daily. 02/12/22   Kennith Gain, MD  promethazine (PHENERGAN) 25 MG tablet Take 1 tablet (25 mg total) by mouth every 8 (eight) hours as needed for nausea or vomiting. 02/03/22   [name], MD  simvastatin (ZOCOR) 20 MG tablet TAKE 1 TABLET BY MOUTH EVERY DAY AT [facility] 04/01/22   [name], MD  triamcinolone (NASACORT) 55 MCG/ACT AERO nasal inhaler  12/01/19   [provider]  zonisamide (ZONEGRAN) 100 MG capsule Take 1 capsule (100 mg total) by mouth at bedtime. 05/06/22 11/02/22  Cobos, Myer Peer, MD    Family History Family History  Problem Relation Age of Onset   Heart disease Father    Hyperlipidemia Father    Alcohol abuse Brother    Alcohol abuse Paternal Uncle    Breast cancer Maternal Aunt        70's   Colon cancer Neg Hx     Social History Social History   Tobacco Use   Smoking status: Former    Packs/day: 1.00    Years: 1.00    Total pack years: 1.00    Types: Cigarettes    Quit date: 04/15/1996    Years since quitting: 26.0    Passive  exposure: Past   Smokeless tobacco: Never   Tobacco comments:    started back again in 2016, smoked for about 6 mo and then quit  Vaping Use   Vaping Use: Never used  Substance Use Topics   Alcohol use: No    Alcohol/week: 0.0 standard drinks of alcohol    Comment: quit 20 years   Drug use: Yes    Frequency: 14.0 times per week    Types: Marijuana    Comment: 20 yrs. ago- cocaine      Allergies   Other, Effexor [venlafaxine], and Lamotrigine   Review of Systems Review of Systems Per HPI  Physical Exam Triage Vital Signs ED Triage Vitals [05/14/22 1104]  Enc Vitals Group     BP (!) 147/90     Pulse Rate 93     Resp 16     Temp 98 F (36.7 C)     Temp Source Oral     SpO2 98 %     Weight      Height      Head Circumference      Peak Flow      Pain Score 5     Pain Loc      Pain Edu?      Excl. in [place]?    No data found.  Updated Vital Signs BP (!) 147/90 (BP Location: Right Arm)   Pulse 93   Temp 98 F (36.7 C) (Oral)   Resp 16   SpO2 98%   Visual Acuity Right Eye Distance:   Left Eye Distance:   Bilateral Distance:    Right Eye Near:   Left Eye Near:    Bilateral Near:     Physical Exam Constitutional:      General: She is not in acute distress.    Appearance: Normal appearance. She is not toxic-appearing or diaphoretic.  HENT:     Head: Normocephalic and atraumatic.     Right Ear: Tympanic membrane and ear canal normal.     Left Ear:  Tympanic membrane and ear canal normal.     Nose: No congestion.     Mouth/Throat:     Mouth: Mucous membranes are moist.     Pharynx: No posterior oropharyngeal erythema.  Eyes:     Extraocular Movements: Extraocular movements intact.     Conjunctiva/sclera: Conjunctivae normal.     Pupils: Pupils are equal, round, and reactive to light.  Cardiovascular:     Rate and Rhythm: Normal rate and regular rhythm.     Pulses: Normal pulses.     Heart sounds: Normal heart sounds.  Pulmonary:     Effort: Pulmonary  effort is normal. No respiratory distress.     Breath sounds: No stridor. Rhonchi present. No wheezing or rales.  Abdominal:     General: Abdomen is flat. Bowel sounds are normal.     Palpations: Abdomen is soft.  Musculoskeletal:        General: Normal range of motion.     Cervical back: Normal range of motion.  Skin:    General: Skin is warm and dry.  Neurological:     General: No focal deficit present.     Mental Status: She is alert and oriented to person, place, and time. Mental status is at baseline.  Psychiatric:        Mood and Affect: Mood normal.        Behavior: Behavior normal.      UC Treatments / Results  Labs (all labs ordered are listed, but only abnormal results are displayed) Labs Reviewed - No data to display  EKG   Radiology DG Chest 2 View  Result Date: 05/14/2022 CLINICAL DATA:  Cough. EXAM: CHEST - 2 VIEW COMPARISON:  02/13/2021. FINDINGS: Mild bronchial wall thickening. No consolidation. Normal heart size and mediastinal contours. No pleural effusion or pneumothorax. Visualized bones and upper abdomen are unremarkable. IMPRESSION: Mild bronchial wall thickening without consolidation. Electronically Signed   By: Emmit Alexanders M.D.   On: 05/14/2022 11:26    Procedures Procedures (including critical care time)  Medications Ordered in UC Medications - No data to display  Initial Impression / Assessment and Plan / UC Course  I have reviewed the triage vital signs and the nursing notes.  Pertinent labs & imaging results that were available during my care of the patient were reviewed by me and considered in my medical decision making (see chart for details).     Chest x-ray showing bronchial thickening but no acute consolidations or concern for pneumonia.  Will treat with prednisone steroid taper as patient has taken steroid several times before and tolerated well.  Suspect possible asthma flareup given she has not been consistently using her nebulizer  treatments as prescribed.  Encouraged continued albuterol use as needed.  Cough medication also prescribed.  Advised strict return precautions.  Patient verbalized understanding and was agreeable with plan. Final Clinical Impressions(s) / UC Diagnoses   Final diagnoses:  Persistent cough     Discharge Instructions      I have prescribed prednisone.  Cough medication also prescribed.  Follow-up if any symptoms persist or worsen.     ED Prescriptions     Medication Sig Dispense Auth. Provider   predniSONE (STERAPRED UNI-PAK 21 TAB) 10 MG (21) TBPK tablet Take by mouth daily. Take 6 tabs by mouth daily  for 2 days, then 5 tabs for 2 days, then 4 tabs for 2 days, then 3 tabs for 2 days, 2 tabs for 2 days, then 1 tab by mouth  daily for 2 days 42 tablet [place], [address] [address], [place]   benzonatate (TESSALON) 100 MG capsule Take 1 capsule (100 mg total) by mouth every 8 (eight) hours as needed for cough. 21 capsule [place], [place]hele [place], [place]      PDMP not reviewed this encounter.   Teodora Medici, [place] 05/14/22 1135

## 2022-05-14 NOTE — ED Triage Notes (Signed)
Pt c/o cough,   Onset ~ 1-2 weeks ago

## 2022-05-14 NOTE — Discharge Instructions (Signed)
I have prescribed prednisone.  Cough medication also prescribed.  Follow-up if any symptoms persist or worsen.

## 2022-05-29 ENCOUNTER — Encounter (HOSPITAL_COMMUNITY): Payer: Self-pay | Admitting: Student in an Organized Health Care Education/Training Program

## 2022-05-29 ENCOUNTER — Ambulatory Visit (HOSPITAL_BASED_OUTPATIENT_CLINIC_OR_DEPARTMENT_OTHER): Payer: Medicare HMO | Admitting: Student in an Organized Health Care Education/Training Program

## 2022-05-29 DIAGNOSIS — F3175 Bipolar disorder, in partial remission, most recent episode depressed: Secondary | ICD-10-CM

## 2022-05-29 DIAGNOSIS — Z79899 Other long term (current) drug therapy: Secondary | ICD-10-CM

## 2022-05-29 MED ORDER — ZONISAMIDE 100 MG PO CAPS: 100.0000 mg | ORAL_CAPSULE | Freq: Every day | ORAL | 3 refills | Status: DC

## 2022-05-29 MED ORDER — BUPROPION HCL ER (XL) 300 MG PO TB24: 300.0000 mg | ORAL_TABLET | Freq: Every day | ORAL | 3 refills | Status: DC
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
## 2022-06-04 ENCOUNTER — Ambulatory Visit (INDEPENDENT_AMBULATORY_CARE_PROVIDER_SITE_OTHER): Payer: Medicare HMO

## 2022-06-04 DIAGNOSIS — J4550 Severe persistent asthma, uncomplicated: Secondary | ICD-10-CM | POA: Diagnosis not present

## 2022-06-10 ENCOUNTER — Other Ambulatory Visit: Payer: Self-pay | Admitting: Allergy

## 2022-06-19 ENCOUNTER — Ambulatory Visit: Payer: Medicare HMO | Admitting: Allergy

## 2022-06-19 ENCOUNTER — Other Ambulatory Visit: Payer: Self-pay

## 2022-06-19 ENCOUNTER — Encounter: Payer: Self-pay | Admitting: Allergy

## 2022-06-19 VITALS — BP 132/88 | HR 82 | Temp 98.2°F | Resp 12 | Wt 137.6 lb

## 2022-06-19 DIAGNOSIS — J3089 Other allergic rhinitis: Secondary | ICD-10-CM | POA: Diagnosis not present

## 2022-06-19 DIAGNOSIS — J4550 Severe persistent asthma, uncomplicated: Secondary | ICD-10-CM | POA: Diagnosis not present

## 2022-06-19 DIAGNOSIS — K219 Gastro-esophageal reflux disease without esophagitis: Secondary | ICD-10-CM | POA: Diagnosis not present

## 2022-06-19 DIAGNOSIS — J329 Chronic sinusitis, unspecified: Secondary | ICD-10-CM | POA: Diagnosis not present

## 2022-06-19 DIAGNOSIS — H1013 Acute atopic conjunctivitis, bilateral: Secondary | ICD-10-CM | POA: Diagnosis not present

## 2022-06-19 MED ORDER — MONTELUKAST SODIUM 10 MG PO TABS: ORAL_TABLET | ORAL | 5 refills | Status: DC

## 2022-06-19 MED ORDER — PREDNISONE 20 MG PO TABS: 20.0000 mg | ORAL_TABLET | Freq: Two times a day (BID) | ORAL | 0 refills | Status: AC

## 2022-06-19 MED ORDER — PANTOPRAZOLE SODIUM 40 MG PO TBEC: 40.0000 mg | DELAYED_RELEASE_TABLET | Freq: Every day | ORAL | 5 refills | Status: DC

## 2022-06-19 MED ORDER — TRELEGY ELLIPTA 200-62.5-25 MCG/ACT IN AEPB: 1.0000 | INHALATION_SPRAY | Freq: Every day | RESPIRATORY_TRACT | 5 refills | Status: DC

## 2022-06-19 NOTE — Progress Notes (Signed)
Follow-up Note  RE: [name] MRN: [id number] DOB: [birth year] Date of Office Visit: 06/19/2022   History of present illness: [name] is a 57 y.o. female presenting today for follow-up of allergic rhinitis with conjunctivitis with recurrent sinusitis as well as asthma and reflux.  She was last seen in the office on 02/14/2022 by myself.  She states she feels that she has another sinus infection.  She has been having increased congestion, increased sinus pressure with clear thick mucus.  She is not getting good sleep because of this congestion.  She denies any fever and does not feel she has had any chills or night sweats.  She is using all of her routine medications including her Singulair and Xyzal daily.  She is also using her nasal sprays fluticasone and ipratropium also. She was wondering today if she needed any further pneumonia vaccines.  We have done her vaccine titers after she had received a Prevnar 20 in 2022 when she had about a 35% protective rate.  This was an increase from previous.  We have been monitoring her need for antibiotics.  We have discussed immunoglobulin infusion therapy.  Fortunately she cannot recall the last antibiotic she is needed.  She does not feel that she is required any this year which is good. In regards to her asthma she is having some wheezing and increased cough and congestion.  She states she has been using the flutter valve which she does find helpful.  I did send in budesonide and Perforomist for nebulizer use however it appears 1 of these cost $175 and the other was around $20.  I am thinking that the Perforomist was likely the high price medication.  This is not something she will be able to continue to get.  She does feel like nebulizer medications worked better than the inhaler manage patients but she has not been able to avoid this.  She does not have any more Trelegy at this time.  Trelegy she states was helpful but not quite as helpful as the  nebulizer medicines.  She continues on Nucala injections once a month. She will use pantoprazole for reflux control.  Review of systems: Review of Systems  Constitutional: Negative.   HENT:  Positive for congestion, sinus pressure, sinus pain and sore throat.   Eyes: Negative.   Respiratory:  Positive for cough and wheezing.   Cardiovascular: Negative.   Gastrointestinal: Negative.   Musculoskeletal: Negative.   Skin: Negative.   Allergic/Immunologic: Negative.   Neurological: Negative.      All other systems negative unless noted above in HPI  Past medical/social/surgical/family history have been reviewed and are unchanged unless specifically indicated below.  No changes  Medication List: Current Outpatient Medications  Medication Sig Dispense Refill   albuterol (VENTOLIN HFA) 108 (90 Base) MCG/ACT inhaler INHALE 2 PUFFS BY MOUTH EVERY 4 TO 6 HOURS AS NEEDED 18 each 1   amoxicillin-clavulanate (AUGMENTIN) 875-125 MG tablet Take 1 tablet by mouth 2 (two) times daily. 20 tablet 0   aspirin 81 MG tablet Take 81 mg by mouth daily.     Azelastine HCl 137 MCG/SPRAY SOLN *NEED OFFICE VISIT* PLACE 2 SPRAYS INTO BOTH NOSTRILS 2 (TWO) TIMES DAILY 30 mL 3   benzonatate (TESSALON) 100 MG capsule Take 1 capsule (100 mg total) by mouth every 8 (eight) hours as needed for cough. 21 capsule 0   buPROPion (WELLBUTRIN XL) 300 MG 24 hr tablet Take 1 tablet (300 mg total) by  mouth daily. 30 tablet 3   Calcium Carb-Cholecalciferol (OYSTER SHELL CALCIUM W/D) 500-5 MG-MCG TABS TAKE 1 TABLET BY MOUTH THREE TIMES A DAY 90 tablet 6   calcium-vitamin D (OSCAL WITH D) 500-200 MG-UNIT tablet Take 1 tablet by mouth 3 (three) times daily. 90 tablet 6   clonazePAM (KLONOPIN) 0.5 MG tablet Take 1 tablet (0.5 mg total) by mouth 2 (two) times daily as needed for anxiety. 20 tablet 1   cyclobenzaprine (FLEXERIL) 10 MG tablet Take 10 mg by mouth 2 (two) times daily as needed.     fluticasone (FLONASE) 50 MCG/ACT  nasal spray Place 2 sprays into both nostrils daily. 16 g 5   Fluticasone-Umeclidin-Vilant (TRELEGY ELLIPTA) 200-62.5-25 MCG/ACT AEPB Inhale 1 puff into the lungs daily. 60 each 5   gabapentin (NEURONTIN) 300 MG capsule Take 1 capsule by mouth 3 (three) times daily.     ipratropium (ATROVENT) 0.03 % nasal spray 2 sprays each nostril twice a day as needed for runny nose/drainage down throat. 30 mL 5   levocetirizine (XYZAL) 5 MG tablet Take 1 tablet daily as needed. 30 tablet 5   mepolizumab (NUCALA) 100 MG/ML SOSY INJECT 100MG  SUBCUTANEOUSLY EVERY 4 WEEKS 1 mL 11   morphine (MS CONTIN) 15 MG 12 hr tablet Take 15 mg by mouth every 12 (twelve) hours.     morphine (MSIR) 15 MG tablet Take 15 mg by mouth every 4 (four) hours as needed for severe pain.     Olopatadine HCl 0.2 % SOLN Place 1 drop in each eye once a day as needed for itchy watery eyes 2.5 mL 5   predniSONE (DELTASONE) 20 MG tablet Take 1 tablet (20 mg total) by mouth 2 (two) times daily with a meal for 7 days. 14 tablet 0   promethazine (PHENERGAN) 25 MG tablet Take 1 tablet (25 mg total) by mouth every 8 (eight) hours as needed for nausea or vomiting. 20 tablet 0   simvastatin (ZOCOR) 20 MG tablet TAKE 1 TABLET BY MOUTH EVERY DAY AT 6PM 90 tablet 0   triamcinolone (NASACORT) 55 MCG/ACT AERO nasal inhaler      zonisamide (ZONEGRAN) 100 MG capsule Take 1 capsule (100 mg total) by mouth at bedtime. 30 capsule 3   montelukast (SINGULAIR) 10 MG tablet TAKE 1 TABLET BY MOUTH EVERYDAY AT BEDTIME 30 tablet 5   pantoprazole (PROTONIX) 40 MG tablet Take 1 tablet (40 mg total) by mouth daily. 30 tablet 5   Current Facility-Administered Medications  Medication Dose Route Frequency Provider Last Rate Last Admin   mepolizumab (NUCALA) injection 100 mg  100 mg Subcutaneous Q28 days Marcelyn Bruins, MD   100 mg at 06/04/22 1229     Known medication allergies: Allergies  Allergen Reactions   Other    Effexor [Venlafaxine] Other (See  Comments)    UNSPECIFIED REACTION, headaches, felt funny, withdrawal with missed dose    Lamotrigine Rash and Other (See Comments)     Physical examination: Blood pressure 132/88, pulse 82, temperature 98.2 F (36.8 C), temperature source Temporal, resp. rate 12, weight 137 lb 9.6 oz (62.4 kg), SpO2 98 %.  General: Alert, interactive, in no acute distress. HEENT: PERRLA, TMs pearly gray, turbinates moderately edematous without discharge, post-pharynx non erythematous. Neck: Supple without lymphadenopathy. Lungs: Mildly decreased breath sounds with expiratory wheezing bilaterally. {no increased work of breathing. CV: Normal S1, S2 without murmurs. Abdomen: Nondistended, nontender. Skin: Warm and dry, without lesions or rashes. Extremities:  No clubbing, cyanosis or edema. Neuro:  Grossly intact.  Diagnositics/Labs:  Spirometry: FEV1: 2.67L 119%, FVC: 3.28L 117%, ratio consistent with nonobstructive pattern  Assessment and plan:   Allergic rhinitis with conjunctivitis (grass pollen, weed pollen, molds, and mouse) Continue Singulair 10 mg once a day Continue Xyzal 5 mg once a day as needed Continue Pataday 1 drop each eye once a day as needed for itchy watery eyes Continue ipratropium bromide nasal spray using 2 sprays each nostril twice a day as needed for runny nose/drainage down throat Continue fluticasone nasal spray 2 sprays each nostril once a day for stuffy nose. May use saline nasal rinse as needed for nasal symptoms. Use this prior to any medicated nasal spray.  Asthma  ResumeTrelegy for now 1 puff daily as nebulizer medication is costly  Perform flutter valve several times a day to help move mucus from airway.   Continue monthly Nucala injections.  Continue Singulair 10 mg as above May use albuterol 2 puffs every 4 hours as needed for cough, wheeze, tightness in chest, or shortness of breath. Also, may use albuterol 2 puffs 5-15 minutes prior to exercise For current  symptoms with wheezing take prednisone course as below  Chronic rhinosinusitis with recurrent illness Increased congestion, sinus pressure however not likely bacterial at this time Take Prednisone  1 tab twice a day for next week Strep pneumonia titers are better but still does not have great protection.  Now has 35% protective rate up from 22% previously.   Let see how you do with infections and if you still struggle with viral or bacterial illnesses then will have to discuss immunoglobulin replacement therapy  Reflux Use pantoprazole daily for reflux control Continue dietary and lifestyle modifications as below  Follow-up in 4-6 months or sooner if needed  I appreciate the opportunity to take part in Malyna's care. Please do not hesitate to contact me with questions.  Sincerely,   Margo Aye, MD Allergy/Immunology [facility]

## 2022-06-19 NOTE — Patient Instructions (Addendum)
Allergic rhinitis with conjunctivitis (grass pollen, weed pollen, molds, and mouse) Continue Singulair 10 mg once a day Continue Xyzal 5 mg once a day as needed Continue Pataday 1 drop each eye once a day as needed for itchy watery eyes Continue ipratropium bromide nasal spray using 2 sprays each nostril twice a day as needed for runny nose/drainage down throat Continue fluticasone nasal spray 2 sprays each nostril once a day for stuffy nose. May use saline nasal rinse as needed for nasal symptoms. Use this prior to any medicated nasal spray.  Asthma  ResumeTrelegy for now 1 puff daily as nebulizer medication is costly  Perform flutter valve several times a day to help move mucus from airway.   Continue monthly Nucala injections.  Continue Singulair 10 mg as above May use albuterol 2 puffs every 4 hours as needed for cough, wheeze, tightness in chest, or shortness of breath. Also, may use albuterol 2 puffs 5-15 minutes prior to exercise For current symptoms with wheezing take prednisone course as below  Chronic rhinosinusitis with recurrent illness Increased congestion, sinus pressure however not likely bacterial at this time Take Prednisone  1 tab twice a day for next week Strep pneumonia titers are better but still does not have great protection.  Now has 35% protective rate up from 22% previously.   Let see how you do with infections and if you still struggle with viral or bacterial illnesses then will have to discuss immunoglobulin replacement therapy  Reflux Use pantoprazole daily for reflux control Continue dietary and lifestyle modifications as below  Follow-up in 4-6 months or sooner if needed

## 2022-06-28 ENCOUNTER — Other Ambulatory Visit: Payer: Self-pay | Admitting: Family Medicine

## 2022-06-28 DIAGNOSIS — Z1231 Encounter for screening mammogram for malignant neoplasm of breast: Secondary | ICD-10-CM

## 2022-07-01 ENCOUNTER — Ambulatory Visit (INDEPENDENT_AMBULATORY_CARE_PROVIDER_SITE_OTHER): Payer: Medicare HMO | Admitting: Family Medicine

## 2022-07-01 VITALS — BP 132/79 | HR 84 | Temp 98.5°F | Resp 16 | Wt 138.0 lb

## 2022-07-01 DIAGNOSIS — J029 Acute pharyngitis, unspecified: Secondary | ICD-10-CM | POA: Diagnosis not present

## 2022-07-01 DIAGNOSIS — M4800 Spinal stenosis, site unspecified: Secondary | ICD-10-CM | POA: Insufficient documentation

## 2022-07-01 DIAGNOSIS — S32010A Wedge compression fracture of first lumbar vertebra, initial encounter for closed fracture: Secondary | ICD-10-CM | POA: Insufficient documentation

## 2022-07-01 DIAGNOSIS — M8700 Idiopathic aseptic necrosis of unspecified bone: Secondary | ICD-10-CM | POA: Insufficient documentation

## 2022-07-01 DIAGNOSIS — J020 Streptococcal pharyngitis: Secondary | ICD-10-CM

## 2022-07-01 DIAGNOSIS — S32409A Unspecified fracture of unspecified acetabulum, initial encounter for closed fracture: Secondary | ICD-10-CM | POA: Insufficient documentation

## 2022-07-01 DIAGNOSIS — R059 Cough, unspecified: Secondary | ICD-10-CM

## 2022-07-01 DIAGNOSIS — M5412 Radiculopathy, cervical region: Secondary | ICD-10-CM | POA: Insufficient documentation

## 2022-07-01 LAB — POCT RAPID STREP A (OFFICE): Rapid Strep A Screen: NEGATIVE

## 2022-07-01 MED ORDER — AZITHROMYCIN 250 MG PO TABS: ORAL_TABLET | ORAL | 0 refills | Status: AC

## 2022-07-01 MED ORDER — HYDROCODONE BIT-HOMATROP MBR 5-1.5 MG/5ML PO SOLN: 5.0000 mL | Freq: Four times a day (QID) | ORAL | 0 refills | Status: DC | PRN

## 2022-07-01 NOTE — Progress Notes (Signed)
Patient here for cough  and sore throat that has been present x 4 days.  - Patient given strep test

## 2022-07-02 ENCOUNTER — Ambulatory Visit: Payer: Medicare HMO

## 2022-07-03 ENCOUNTER — Ambulatory Visit: Payer: Medicare HMO

## 2022-07-03 ENCOUNTER — Encounter: Payer: Self-pay | Admitting: Family Medicine

## 2022-07-03 NOTE — Progress Notes (Signed)
Established Patient Office Visit  Subjective    Patient ID: [name], female    DOB: 02-03-1966  Age: 57 y.o. MRN: [id number]  CC:  Chief Complaint  Patient presents with   Sore Throat    HPI [name] presents for complaint of cough. Patient has had cough since changing of weather but it has worsened over the past several days. Denies fever/chills or viral sx.  Will be going on a vacation on next week.    Outpatient Encounter Medications as of 07/01/2022  Medication Sig   albuterol (VENTOLIN HFA) 108 (90 Base) MCG/ACT inhaler INHALE 2 PUFFS BY MOUTH EVERY 4 TO 6 HOURS AS NEEDED   amoxicillin-clavulanate (AUGMENTIN) 875-125 MG tablet Take 1 tablet by mouth 2 (two) times daily.   aspirin 81 MG tablet Take 81 mg by mouth daily.   Azelastine HCl 137 MCG/SPRAY SOLN *NEED OFFICE VISIT* PLACE 2 SPRAYS INTO BOTH NOSTRILS 2 (TWO) TIMES DAILY   azithromycin (ZITHROMAX) 250 MG tablet Take 2 tablets on day 1, then 1 tablet daily on days 2 through 5   benzonatate (TESSALON) 100 MG capsule Take 1 capsule (100 mg total) by mouth every 8 (eight) hours as needed for cough.   buPROPion (WELLBUTRIN XL) 300 MG 24 hr tablet Take 1 tablet (300 mg total) by mouth daily.   Calcium Carb-Cholecalciferol (OYSTER SHELL CALCIUM W/D) 500-5 MG-MCG TABS TAKE 1 TABLET BY MOUTH THREE TIMES A DAY   calcium-vitamin D (OSCAL WITH D) 500-200 MG-UNIT tablet Take 1 tablet by mouth 3 (three) times daily.   clonazePAM (KLONOPIN) 0.5 MG tablet Take 1 tablet (0.5 mg total) by mouth 2 (two) times daily as needed for anxiety.   cyclobenzaprine (FLEXERIL) 10 MG tablet Take 10 mg by mouth 2 (two) times daily as needed.   fluticasone (FLONASE) 50 MCG/ACT nasal spray Place 2 sprays into both nostrils daily.   Fluticasone-Umeclidin-Vilant (TRELEGY ELLIPTA) 200-62.5-25 MCG/ACT AEPB Inhale 1 puff into the lungs daily.   gabapentin (NEURONTIN) 300 MG capsule Take 1 capsule by mouth 3 (three) times daily.   HYDROcodone  bit-homatropine (HYCODAN) 5-1.5 MG/5ML syrup Take 5 mLs by mouth every 6 (six) hours as needed for cough.   ipratropium (ATROVENT) 0.03 % nasal spray 2 sprays each nostril twice a day as needed for runny nose/drainage down throat.   levocetirizine (XYZAL) 5 MG tablet Take 1 tablet daily as needed.   mepolizumab (NUCALA) 100 MG/ML SOSY INJECT 100MG  SUBCUTANEOUSLY EVERY 4 WEEKS   montelukast (SINGULAIR) 10 MG tablet TAKE 1 TABLET BY MOUTH EVERYDAY AT BEDTIME   morphine (MS CONTIN) 15 MG 12 hr tablet Take 15 mg by mouth every 12 (twelve) hours.   morphine (MSIR) 15 MG tablet Take 15 mg by mouth every 4 (four) hours as needed for severe pain.   Olopatadine HCl 0.2 % SOLN Place 1 drop in each eye once a day as needed for itchy watery eyes   pantoprazole (PROTONIX) 40 MG tablet Take 1 tablet (40 mg total) by mouth daily.   promethazine (PHENERGAN) 25 MG tablet Take 1 tablet (25 mg total) by mouth every 8 (eight) hours as needed for nausea or vomiting.   simvastatin (ZOCOR) 20 MG tablet TAKE 1 TABLET BY MOUTH EVERY DAY AT 6PM   triamcinolone (NASACORT) 55 MCG/ACT AERO nasal inhaler    zonisamide (ZONEGRAN) 100 MG capsule Take 1 capsule (100 mg total) by mouth at bedtime.   Facility-Administered Encounter Medications as of 07/01/2022  Medication   mepolizumab (  NUCALA) injection 100 mg    Past Medical History:  Diagnosis Date   Anxiety    Arthritis    neck, knees, shoulders   Asthma    Bipolar disorder (HCC)    currently feeling MANIC- 10/02/2016   Depression    GERD (gastroesophageal reflux disease)    Hip fracture (HCC) 06/2019   History of blood transfusion    as a newborn    History of lump of left breast    Hyperlipidemia    Lumbar pseudoarthrosis    Motion sickness    cars   OSA (obstructive sleep apnea) 01/09/2016   can't afford CPAP   Personality disorder (HCC)    PONV (postoperative nausea and vomiting)    Post traumatic stress disorder (PTSD)    Substance abuse (HCC)     Synovial cyst     Past Surgical History:  Procedure Laterality Date   COLONOSCOPY WITH PROPOFOL N/A 11/04/2017   Procedure: COLONOSCOPY WITH PROPOFOL;  Surgeon: Toney Reil, MD;  Location: [facility];  Service: Endoscopy;  Laterality: N/A;   ESOPHAGOGASTRODUODENOSCOPY (EGD) WITH PROPOFOL N/A 11/04/2017   Procedure: ESOPHAGOGASTRODUODENOSCOPY (EGD) WITH PROPOFOL with biopsies;  Surgeon: Toney Reil, MD;  Location: [facility];  Service: Endoscopy;  Laterality: N/A;  sleep apnea   KNEE SURGERY Left    x5, post basketball injury   LUMBAR LAMINECTOMY/DECOMPRESSION MICRODISCECTOMY Left 01/22/2016   Procedure: Laminectomy for facet/synovial cyst - left - Lumbar four - lumbar five;  Surgeon: Julio Sicks, MD;  Location: [facility];  Service: Neurosurgery;  Laterality: Left;  Laminectomy for facet/synovial cyst - left - Lumbar four - lumbar five   POLYPECTOMY N/A 11/04/2017   Procedure: POLYPECTOMY INTESTINAL;  Surgeon: Toney Reil, MD;  Location: [facility];  Service: Endoscopy;  Laterality: N/A;   SHOULDER SURGERY Left    x2   SPINE SURGERY N/A    [employer]reesia 08/07/2019   TOE SURGERY Bilateral    bone spurs    Family History  Problem Relation Age of Onset   Heart disease Father    Hyperlipidemia Father    Alcohol abuse Brother    Alcohol abuse Paternal Uncle    Breast cancer Maternal Aunt        70's   Colon cancer Neg Hx     Social History   Socioeconomic History   Marital status: Single    Spouse name: Not on file   Number of children: 0   Years of education: Not on file   Highest education level: Bachelor's degree (e.g., BA, AB, BS)  Occupational History   Not on file  Tobacco Use   Smoking status: Former    Packs/day: 1.00    Years: 1.00    Additional pack years: 0.00    Total pack years: 1.00    Types: Cigarettes    Quit date: 04/15/1996    Years since quitting: 26.2    Passive exposure: Past   Smokeless tobacco: Never   Tobacco  comments:    started back again in 2016, smoked for about 6 mo and then quit  Vaping Use   Vaping Use: Never used  Substance and Sexual Activity   Alcohol use: No    Alcohol/week: 0.0 standard drinks of alcohol    Comment: quit 20 years   Drug use: Yes    Frequency: 14.0 times per week    Types: Marijuana    Comment: 20 yrs. ago- cocaine    Sexual activity: Not Currently  Other Topics Concern   Not on file  Social History Narrative   Not on file   Social Determinants of Health   Financial Resource Strain: Medium Risk (07/01/2022)   Overall Financial Resource Strain (CARDIA)    Difficulty of Paying Living Expenses: Somewhat hard  Food Insecurity: No Food Insecurity (07/01/2022)   Hunger Vital Sign    Worried About Running Out of Food in the Last Year: Never true    Ran Out of Food in the Last Year: Never true  Transportation Needs: No Transportation Needs (07/01/2022)   PRAPARE - Administrator, Civil Service (Medical): No    Lack of Transportation (Non-Medical): No  Physical Activity: Unknown (07/01/2022)   Exercise Vital Sign    Days of Exercise per Week: Patient declined    Minutes of Exercise per Session: Not on file  Stress: Patient Declined (07/01/2022)   [employer] of Occupational Health - Occupational Stress Questionnaire    Feeling of Stress : Patient declined  Social Connections: Moderately Isolated (07/01/2022)   Social Connection and Isolation Panel [NHANES]    Frequency of Communication with Friends and Family: More than three times a week    Frequency of Social Gatherings with Friends and Family: More than three times a week    Attends Religious Services: 1 to 4 times per year    Active Member of [employer] or Organizations: No    Attends Engineer, structural: Not on file    Marital Status: Never married  Intimate Partner Violence: Not on file    Review of Systems  Constitutional:  Negative for chills and fever.  HENT:  Positive for sore  throat.   Respiratory:  Positive for cough. Negative for shortness of breath.   All other systems reviewed and are negative.       Objective    BP 132/79   Pulse 84   Temp 98.5 F (36.9 C) (Oral)   Resp 16   Wt 138 lb (62.6 kg)   SpO2 93%   BMI 26.09 kg/m   Physical Exam Vitals and nursing note reviewed.  Constitutional:      General: She is not in acute distress. HENT:     Mouth/Throat:     Mouth: Mucous membranes are moist.     Pharynx: Oropharynx is clear.  Cardiovascular:     Rate and Rhythm: Normal rate and regular rhythm.  Pulmonary:     Effort: Pulmonary effort is normal.     Breath sounds: Normal breath sounds.  Abdominal:     Palpations: Abdomen is soft.     Tenderness: There is no abdominal tenderness.  Neurological:     General: No focal deficit present.     Mental Status: She is alert and oriented to person, place, and time.         Assessment & Plan:   1. Acute pharyngitis, unspecified etiology Zithromax prescribed - POCT rapid strep A  2. Cough, unspecified type Hycodan prescribed    Return if symptoms worsen or fail to improve.   Tommie Raymond, MD

## 2022-07-08 ENCOUNTER — Ambulatory Visit (INDEPENDENT_AMBULATORY_CARE_PROVIDER_SITE_OTHER): Payer: Medicare HMO

## 2022-07-08 DIAGNOSIS — J4550 Severe persistent asthma, uncomplicated: Secondary | ICD-10-CM | POA: Diagnosis not present

## 2022-07-10 DIAGNOSIS — Z1231 Encounter for screening mammogram for malignant neoplasm of breast: Secondary | ICD-10-CM

## 2022-07-15 ENCOUNTER — Ambulatory Visit (INDEPENDENT_AMBULATORY_CARE_PROVIDER_SITE_OTHER): Payer: Medicare HMO | Admitting: Family

## 2022-07-15 ENCOUNTER — Encounter: Payer: Self-pay | Admitting: Family

## 2022-07-15 VITALS — BP 134/90 | HR 85 | Temp 98.1°F | Resp 16 | Wt 144.0 lb

## 2022-07-15 DIAGNOSIS — M25512 Pain in left shoulder: Secondary | ICD-10-CM

## 2022-07-15 DIAGNOSIS — G8929 Other chronic pain: Secondary | ICD-10-CM | POA: Diagnosis not present

## 2022-07-15 MED ORDER — TRIAMCINOLONE ACETONIDE 40 MG/ML IJ SUSP
40.0000 mg | Freq: Once | INTRAMUSCULAR | Status: AC
Start: 2022-07-15 — End: 2022-07-15
  Administered 2022-07-15: 40 mg via INTRAMUSCULAR

## 2022-07-15 MED ORDER — KETOROLAC TROMETHAMINE 60 MG/2ML IM SOLN
60.0000 mg | Freq: Once | INTRAMUSCULAR | Status: AC
Start: 2022-07-15 — End: 2022-07-15
  Administered 2022-07-15: 60 mg via INTRAMUSCULAR

## 2022-07-15 NOTE — Progress Notes (Signed)
Left shoulder pain  Had surgery in 1996 or 1998  8/10, unable to sleep Chronic Worse with abduction  Has been seen by [facility]. They did not do they surgery.  Does not recall who did.   Left leg nerve pain and discomfort Worse at night

## 2022-07-15 NOTE — Progress Notes (Signed)
Patient ID: [name], female    DOB: 01-Jul-1965  MRN: [id number]  CC: Shoulder Pain  Subjective: [name] is a 57 y.o. female who presents for shoulder pain.   Her concerns today include:  - Chronic left shoulder pain. She denies recent trauma/injury and no red flag symptoms. Reports she is taking pain medication and a muscle relaxer prescribed by her orthopedist for her back pain with no relief. Reports left shoulder surgery in the 1990's. Reports she discussed chronic left shoulder pain with Orthopedics who said she has a bone spur of left shoulder. Reports she was given an injection in December 2023 which helped with range of motion.  - Established with Orthopedics for management of left knee/leg pain. States she doesn't "have time" for knee surgery.  - No further issues/concerns for discussion today.   Patient Active Problem List   Diagnosis Date Noted   Avascular necrosis of bone (HCC) 07/01/2022   Cervical radiculopathy 07/01/2022   Closed fracture of acetabulum (HCC) 07/01/2022   Compression fracture of L1 lumbar vertebra (HCC) 07/01/2022   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   Hiatal hernia 04/21/2020   Other chest pain 04/07/2020   Acute non intractable tension-type headache 04/07/2020   Medial epicondylitis of left elbow 07/19/2019   Pain in left hip 07/19/2019   Asthma exacerbation 11/22/2018   GERD (gastroesophageal reflux disease) 06/22/2018   Cannabis use disorder, moderate, dependence (HCC) 04/22/2018   Insomnia 04/15/2018   Non-intractable vomiting    Elevated blood pressure reading without diagnosis of hypertension 06/13/2017   GAD (generalized anxiety disorder) 05/23/2017   OSA (obstructive sleep apnea) 01/09/2016   Asthma 11/21/2015   Chronic pain syndrome 11/21/2015   HLD (hyperlipidemia) 09/12/2014   Bipolar 1 disorder, depressed, partial remission (HCC) 09/12/2014     Current Outpatient Medications on File Prior to Visit  Medication  Sig Dispense Refill   albuterol (VENTOLIN HFA) 108 (90 Base) MCG/ACT inhaler INHALE 2 PUFFS BY MOUTH EVERY 4 TO 6 HOURS AS NEEDED 18 each 1   amoxicillin-clavulanate (AUGMENTIN) 875-125 MG tablet Take 1 tablet by mouth 2 (two) times daily. 20 tablet 0   aspirin 81 MG tablet Take 81 mg by mouth daily.     Azelastine HCl 137 MCG/SPRAY SOLN *NEED OFFICE VISIT* PLACE 2 SPRAYS INTO BOTH NOSTRILS 2 (TWO) TIMES DAILY 30 mL 3   benzonatate (TESSALON) 100 MG capsule Take 1 capsule (100 mg total) by mouth every 8 (eight) hours as needed for cough. 21 capsule 0   buPROPion (WELLBUTRIN XL) 300 MG 24 hr tablet Take 1 tablet (300 mg total) by mouth daily. 30 tablet 3   Calcium Carb-Cholecalciferol (OYSTER SHELL CALCIUM W/D) 500-5 MG-MCG TABS TAKE 1 TABLET BY MOUTH THREE TIMES A DAY 90 tablet 6   calcium-vitamin D (OSCAL WITH D) 500-200 MG-UNIT tablet Take 1 tablet by mouth 3 (three) times daily. 90 tablet 6   clonazePAM (KLONOPIN) 0.5 MG tablet Take 1 tablet (0.5 mg total) by mouth 2 (two) times daily as needed for anxiety. 20 tablet 1   cyclobenzaprine (FLEXERIL) 10 MG tablet Take 10 mg by mouth 2 (two) times daily as needed.     fluticasone (FLONASE) 50 MCG/ACT nasal spray Place 2 sprays into both nostrils daily. 16 g 5   Fluticasone-Umeclidin-Vilant (TRELEGY ELLIPTA) 200-62.5-25 MCG/ACT AEPB Inhale 1 puff into the lungs daily. 60 each 5   gabapentin (NEURONTIN) 300 MG capsule Take 1 capsule by mouth 3 (three) times daily.  ipratropium (ATROVENT) 0.03 % nasal spray 2 sprays each nostril twice a day as needed for runny nose/drainage down throat. 30 mL 5   levocetirizine (XYZAL) 5 MG tablet Take 1 tablet daily as needed. 30 tablet 5   mepolizumab (NUCALA) 100 MG/ML SOSY INJECT 100MG  SUBCUTANEOUSLY EVERY 4 WEEKS 1 mL 11   montelukast (SINGULAIR) 10 MG tablet TAKE 1 TABLET BY MOUTH EVERYDAY AT BEDTIME 30 tablet 5   morphine (MS CONTIN) 15 MG 12 hr tablet Take 15 mg by mouth every 12 (twelve) hours.      morphine (MSIR) 15 MG tablet Take 15 mg by mouth every 4 (four) hours as needed for severe pain.     Olopatadine HCl 0.2 % SOLN Place 1 drop in each eye once a day as needed for itchy watery eyes 2.5 mL 5   pantoprazole (PROTONIX) 40 MG tablet Take 1 tablet (40 mg total) by mouth daily. 30 tablet 5   promethazine (PHENERGAN) 25 MG tablet Take 1 tablet (25 mg total) by mouth every 8 (eight) hours as needed for nausea or vomiting. 20 tablet 0   simvastatin (ZOCOR) 20 MG tablet TAKE 1 TABLET BY MOUTH EVERY DAY AT 6PM 90 tablet 0   triamcinolone (NASACORT) 55 MCG/ACT AERO nasal inhaler      zonisamide (ZONEGRAN) 100 MG capsule Take 1 capsule (100 mg total) by mouth at bedtime. 30 capsule 3   HYDROcodone bit-homatropine (HYCODAN) 5-1.5 MG/5ML syrup Take 5 mLs by mouth every 6 (six) hours as needed for cough. (Patient not taking: Reported on 07/15/2022) 120 mL 0   Current Facility-Administered Medications on File Prior to Visit  Medication Dose Route Frequency Provider Last Rate Last Admin   mepolizumab (NUCALA) injection 100 mg  100 mg Subcutaneous Q28 days Marcelyn Bruins, MD   100 mg at 07/08/22 0908    Allergies  Allergen Reactions   Other    Effexor [Venlafaxine] Other (See Comments)    UNSPECIFIED REACTION, headaches, felt funny, withdrawal with missed dose    Lamotrigine Rash and Other (See Comments)    Social History   Socioeconomic History   Marital status: Single    Spouse name: Not on file   Number of children: 0   Years of education: Not on file   Highest education level: Bachelor's degree (e.g., BA, AB, BS)  Occupational History   Not on file  Tobacco Use   Smoking status: Former    Packs/day: 1.00    Years: 1.00    Additional pack years: 0.00    Total pack years: 1.00    Types: Cigarettes    Quit date: 04/15/1996    Years since quitting: 26.2    Passive exposure: Past   Smokeless tobacco: Never   Tobacco comments:    started back again in 2016, smoked for  about 6 mo and then quit  Vaping Use   Vaping Use: Never used  Substance and Sexual Activity   Alcohol use: No    Alcohol/week: 0.0 standard drinks of alcohol    Comment: quit 20 years   Drug use: Yes    Frequency: 14.0 times per week    Types: Marijuana    Comment: 20 yrs. ago- cocaine    Sexual activity: Not Currently  Other Topics Concern   Not on file  Social History Narrative   Not on file   Social Determinants of Health   Financial Resource Strain: Medium Risk (07/01/2022)   Overall Financial Resource Strain (CARDIA)  Difficulty of Paying Living Expenses: Somewhat hard  Food Insecurity: No Food Insecurity (07/01/2022)   Hunger Vital Sign    Worried About Running Out of Food in the Last Year: Never true    Ran Out of Food in the Last Year: Never true  Transportation Needs: No Transportation Needs (07/01/2022)   PRAPARE - Administrator, Civil Service (Medical): No    Lack of Transportation (Non-Medical): No  Physical Activity: Unknown (07/01/2022)   Exercise Vital Sign    Days of Exercise per Week: Patient declined    Minutes of Exercise per Session: Not on file  Stress: Patient Declined (07/01/2022)   [employer] of Occupational Health - Occupational Stress Questionnaire    Feeling of Stress : Patient declined  Social Connections: Moderately Isolated (07/01/2022)   Social Connection and Isolation Panel [NHANES]    Frequency of Communication with Friends and Family: More than three times a week    Frequency of Social Gatherings with Friends and Family: More than three times a week    Attends Religious Services: 1 to 4 times per year    Active Member of [employer] or Organizations: No    Attends Engineer, structural: Not on file    Marital Status: Never married  Catering manager Violence: Not on file    Family History  Problem Relation Age of Onset   Heart disease Father    Hyperlipidemia Father    Alcohol abuse Brother    Alcohol abuse  Paternal Uncle    Breast cancer Maternal Aunt        70's   Colon cancer Neg Hx     Past Surgical History:  Procedure Laterality Date   COLONOSCOPY WITH PROPOFOL N/A 11/04/2017   Procedure: COLONOSCOPY WITH PROPOFOL;  Surgeon: Toney Reil, MD;  Location: [facility];  Service: Endoscopy;  Laterality: N/A;   ESOPHAGOGASTRODUODENOSCOPY (EGD) WITH PROPOFOL N/A 11/04/2017   Procedure: ESOPHAGOGASTRODUODENOSCOPY (EGD) WITH PROPOFOL with biopsies;  Surgeon: Toney Reil, MD;  Location: [facility];  Service: Endoscopy;  Laterality: N/A;  sleep apnea   KNEE SURGERY Left    x5, post basketball injury   LUMBAR LAMINECTOMY/DECOMPRESSION MICRODISCECTOMY Left 01/22/2016   Procedure: Laminectomy for facet/synovial cyst - left - Lumbar four - lumbar five;  Surgeon: Julio Sicks, MD;  Location: [facility] OR;  Service: Neurosurgery;  Laterality: Left;  Laminectomy for facet/synovial cyst - left - Lumbar four - lumbar five   POLYPECTOMY N/A 11/04/2017   Procedure: POLYPECTOMY INTESTINAL;  Surgeon: Toney Reil, MD;  Location: [facility];  Service: Endoscopy;  Laterality: N/A;   SHOULDER SURGERY Left    x2   SPINE SURGERY N/A    Phreesia 08/07/2019   TOE SURGERY Bilateral    bone spurs    ROS: Review of Systems Negative except as stated above  PHYSICAL EXAM: BP (!) 134/90 (BP Location: Right Arm, Patient Position: Sitting, Cuff Size: Normal)   Pulse 85   Temp 98.1 F (36.7 C) (Oral)   Resp 16   Wt 144 lb (65.3 kg)   SpO2 98%   BMI 27.22 kg/m   Physical Exam HENT:     Head: Normocephalic and atraumatic.  Eyes:     Extraocular Movements: Extraocular movements intact.     Conjunctiva/sclera: Conjunctivae normal.     Pupils: Pupils are equal, round, and reactive to light.  Cardiovascular:     Rate and Rhythm: Normal rate and regular rhythm.     Pulses:  Normal pulses.     Heart sounds: Normal heart sounds.  Pulmonary:     Effort: Pulmonary effort is  normal.     Breath sounds: Normal breath sounds.  Musculoskeletal:     Right shoulder: Normal.     Left shoulder: Tenderness present.     Right upper arm: Normal.     Left upper arm: Normal.     Right elbow: Normal.     Left elbow: Normal.     Right forearm: Normal.     Left forearm: Normal.     Right wrist: Normal.     Left wrist: Normal.     Right hand: Normal.     Left hand: Normal.     Cervical back: Normal range of motion and neck supple.     Comments: Left shoulder decreased range of motion.  Neurological:     General: No focal deficit present.     Mental Status: She is alert and oriented to person, place, and time.  Psychiatric:        Mood and Affect: Mood normal.        Behavior: Behavior normal.     ASSESSMENT AND PLAN: 1. Chronic left shoulder pain - Triamcinolone Acetonide injection and Ketorolac injection administered in office by Guy Franco, RN.  - Keep all scheduled appointments with Orthopedics. - Follow-up with primary provider in 4 weeks or sooner if needed.  - triamcinolone acetonide (KENALOG-40) injection 40 mg - ketorolac (TORADOL) injection 60 mg  Patient was given the opportunity to ask questions.  Patient verbalized understanding of the plan and was able to repeat key elements of the plan. Patient was given clear instructions to go to Emergency Department or return to medical center if symptoms don't improve, worsen, or new problems develop.The patient verbalized understanding.  Return in about 4 weeks (around 08/12/2022) for Follow-Up or next available with Georganna Skeans, MD.  Rema Fendt, NP

## 2022-07-23 DIAGNOSIS — M4317 Spondylolisthesis, lumbosacral region: Secondary | ICD-10-CM | POA: Diagnosis not present

## 2022-07-23 DIAGNOSIS — Z6826 Body mass index (BMI) 26.0-26.9, adult: Secondary | ICD-10-CM | POA: Diagnosis not present

## 2022-07-25 ENCOUNTER — Other Ambulatory Visit: Payer: Self-pay | Admitting: Family Medicine

## 2022-07-30 ENCOUNTER — Ambulatory Visit
Admission: RE | Admit: 2022-07-30 | Discharge: 2022-07-30 | Disposition: A | Discharge status: Home / Self Care | Payer: Medicare HMO | Source: Ambulatory Visit

## 2022-07-30 DIAGNOSIS — Z1231 Encounter for screening mammogram for malignant neoplasm of breast: Secondary | ICD-10-CM | POA: Diagnosis not present

## 2022-08-04 DIAGNOSIS — M5412 Radiculopathy, cervical region: Secondary | ICD-10-CM | POA: Diagnosis not present

## 2022-08-05 ENCOUNTER — Ambulatory Visit (INDEPENDENT_AMBULATORY_CARE_PROVIDER_SITE_OTHER): Payer: Medicare HMO | Admitting: *Deleted

## 2022-08-05 DIAGNOSIS — J4550 Severe persistent asthma, uncomplicated: Secondary | ICD-10-CM | POA: Diagnosis not present

## 2022-08-11 NOTE — Progress Notes (Unsigned)
Patient ID: [name], female    DOB: 12/11/1965  MRN: [id number]  CC: Cough/Congestion   Subjective: [name] is a 57 y.o. female who presents for cough/congestion.   Her concerns today include:  Intermittent cough, sore throat, and sinus congestion x 2 months. She denies associated red flag symptoms. Reports she is established with an allergist and thinks current symptoms are related. Today she requests refill of previously prescribed cough syrup (Hycodan) from her primary care provider and a steroid. No further issues/concerns for discussion today.     Patient Active Problem List   Diagnosis Date Noted   Avascular necrosis of bone (HCC) 07/01/2022   Cervical radiculopathy 07/01/2022   Closed fracture of acetabulum (HCC) 07/01/2022   Compression fracture of L1 lumbar vertebra (HCC) 07/01/2022   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   Hiatal hernia 04/21/2020   Other chest pain 04/07/2020   Acute non intractable tension-type headache 04/07/2020   Medial epicondylitis of left elbow 07/19/2019   Pain in left hip 07/19/2019   Asthma exacerbation 11/22/2018   GERD (gastroesophageal reflux disease) 06/22/2018   Cannabis use disorder, moderate, dependence (HCC) 04/22/2018   Insomnia 04/15/2018   Non-intractable vomiting    Elevated blood pressure reading without diagnosis of hypertension 06/13/2017   GAD (generalized anxiety disorder) 05/23/2017   OSA (obstructive sleep apnea) 01/09/2016   Asthma 11/21/2015   Chronic pain syndrome 11/21/2015   HLD (hyperlipidemia) 09/12/2014   Bipolar 1 disorder, depressed, partial remission (HCC) 09/12/2014     Current Outpatient Medications on File Prior to Visit  Medication Sig Dispense Refill   albuterol (VENTOLIN HFA) 108 (90 Base) MCG/ACT inhaler INHALE 2 PUFFS BY MOUTH EVERY 4 TO 6 HOURS AS NEEDED 18 each 1   amoxicillin-clavulanate (AUGMENTIN) 875-125 MG tablet Take 1 tablet by mouth 2 (two) times daily. 20 tablet 0    aspirin 81 MG tablet Take 81 mg by mouth daily.     Azelastine HCl 137 MCG/SPRAY SOLN *NEED OFFICE VISIT* PLACE 2 SPRAYS INTO BOTH NOSTRILS 2 (TWO) TIMES DAILY 30 mL 3   benzonatate (TESSALON) 100 MG capsule Take 1 capsule (100 mg total) by mouth every 8 (eight) hours as needed for cough. 21 capsule 0   buPROPion (WELLBUTRIN XL) 300 MG 24 hr tablet Take 1 tablet (300 mg total) by mouth daily. 30 tablet 3   Calcium Carb-Cholecalciferol (OYSTER SHELL CALCIUM W/D) 500-5 MG-MCG TABS TAKE 1 TABLET BY MOUTH THREE TIMES A DAY 90 tablet 6   calcium-vitamin D (OSCAL WITH D) 500-200 MG-UNIT tablet Take 1 tablet by mouth 3 (three) times daily. 90 tablet 6   clonazePAM (KLONOPIN) 0.5 MG tablet Take 1 tablet (0.5 mg total) by mouth 2 (two) times daily as needed for anxiety. 20 tablet 1   cyclobenzaprine (FLEXERIL) 10 MG tablet Take 10 mg by mouth 2 (two) times daily as needed.     fluticasone (FLONASE) 50 MCG/ACT nasal spray Place 2 sprays into both nostrils daily. 16 g 5   Fluticasone-Umeclidin-Vilant (TRELEGY ELLIPTA) 200-62.5-25 MCG/ACT AEPB Inhale 1 puff into the lungs daily. 60 each 5   gabapentin (NEURONTIN) 300 MG capsule Take 1 capsule by mouth 3 (three) times daily.     ipratropium (ATROVENT) 0.03 % nasal spray 2 sprays each nostril twice a day as needed for runny nose/drainage down throat. 30 mL 5   levocetirizine (XYZAL) 5 MG tablet Take 1 tablet daily as needed. 30 tablet 5   mepolizumab (NUCALA) 100 MG/ML SOSY INJECT 100MG   SUBCUTANEOUSLY EVERY 4 WEEKS 1 mL 11   montelukast (SINGULAIR) 10 MG tablet TAKE 1 TABLET BY MOUTH EVERYDAY AT BEDTIME 30 tablet 5   morphine (MS CONTIN) 15 MG 12 hr tablet Take 15 mg by mouth every 12 (twelve) hours.     morphine (MSIR) 15 MG tablet Take 15 mg by mouth every 4 (four) hours as needed for severe pain.     Olopatadine HCl 0.2 % SOLN Place 1 drop in each eye once a day as needed for itchy watery eyes 2.5 mL 5   pantoprazole (PROTONIX) 40 MG tablet Take 1 tablet (40  mg total) by mouth daily. 30 tablet 5   promethazine (PHENERGAN) 25 MG tablet Take 1 tablet (25 mg total) by mouth every 8 (eight) hours as needed for nausea or vomiting. 20 tablet 0   simvastatin (ZOCOR) 20 MG tablet TAKE 1 TABLET BY MOUTH EVERY DAY AT 6PM 90 tablet 0   triamcinolone (NASACORT) 55 MCG/ACT AERO nasal inhaler      zonisamide (ZONEGRAN) 100 MG capsule Take 1 capsule (100 mg total) by mouth at bedtime. 30 capsule 3   Current Facility-Administered Medications on File Prior to Visit  Medication Dose Route Frequency Provider Last Rate Last Admin   mepolizumab (NUCALA) injection 100 mg  100 mg Subcutaneous Q28 days Marcelyn Bruins, MD   100 mg at 08/05/22 1610    Allergies  Allergen Reactions   Other    Effexor [Venlafaxine] Other (See Comments)    UNSPECIFIED REACTION, headaches, felt funny, withdrawal with missed dose    Lamotrigine Rash and Other (See Comments)    Social History   Socioeconomic History   Marital status: Single    Spouse name: Not on file   Number of children: 0   Years of education: Not on file   Highest education level: Bachelor's degree (e.g., BA, AB, BS)  Occupational History   Not on file  Tobacco Use   Smoking status: Former    Packs/day: 1.00    Years: 1.00    Additional pack years: 0.00    Total pack years: 1.00    Types: Cigarettes    Quit date: 04/15/1996    Years since quitting: 26.3    Passive exposure: Past   Smokeless tobacco: Never   Tobacco comments:    started back again in 2016, smoked for about 6 mo and then quit  Vaping Use   Vaping Use: Never used  Substance and Sexual Activity   Alcohol use: No    Alcohol/week: 0.0 standard drinks of alcohol    Comment: quit 20 years   Drug use: Yes    Frequency: 14.0 times per week    Types: Marijuana    Comment: 20 yrs. ago- cocaine    Sexual activity: Not Currently  Other Topics Concern   Not on file  Social History Narrative   Not on file   Social Determinants of  Health   Financial Resource Strain: Medium Risk (07/01/2022)   Overall Financial Resource Strain (CARDIA)    Difficulty of Paying Living Expenses: Somewhat hard  Food Insecurity: No Food Insecurity (07/01/2022)   Hunger Vital Sign    Worried About Running Out of Food in the Last Year: Never true    Ran Out of Food in the Last Year: Never true  Transportation Needs: No Transportation Needs (07/01/2022)   PRAPARE - Administrator, Civil Service (Medical): No    Lack of Transportation (Non-Medical): No  Physical  Activity: Unknown (07/01/2022)   Exercise Vital Sign    Days of Exercise per Week: Patient declined    Minutes of Exercise per Session: Not on file  Stress: Patient Declined (07/01/2022)   [employer] of Occupational Health - Occupational Stress Questionnaire    Feeling of Stress : Patient declined  Social Connections: Moderately Isolated (07/01/2022)   Social Connection and Isolation Panel [NHANES]    Frequency of Communication with Friends and Family: More than three times a week    Frequency of Social Gatherings with Friends and Family: More than three times a week    Attends Religious Services: 1 to 4 times per year    Active Member of [employer] or Organizations: No    Attends Engineer, structural: Not on file    Marital Status: Never married  Catering manager Violence: Not on file    Family History  Problem Relation Age of Onset   Heart disease Father    Hyperlipidemia Father    Alcohol abuse Brother    Alcohol abuse Paternal Uncle    Breast cancer Maternal Aunt        70's   Colon cancer Neg Hx     Past Surgical History:  Procedure Laterality Date   COLONOSCOPY WITH PROPOFOL N/A 11/04/2017   Procedure: COLONOSCOPY WITH PROPOFOL;  Surgeon: Toney Reil, MD;  Location: [facility];  Service: Endoscopy;  Laterality: N/A;   ESOPHAGOGASTRODUODENOSCOPY (EGD) WITH PROPOFOL N/A 11/04/2017   Procedure: ESOPHAGOGASTRODUODENOSCOPY (EGD) WITH  PROPOFOL with biopsies;  Surgeon: Toney Reil, MD;  Location: [facility];  Service: Endoscopy;  Laterality: N/A;  sleep apnea   KNEE SURGERY Left    x5, post basketball injury   LUMBAR LAMINECTOMY/DECOMPRESSION MICRODISCECTOMY Left 01/22/2016   Procedure: Laminectomy for facet/synovial cyst - left - Lumbar four - lumbar five;  Surgeon: Julio Sicks, MD;  Location: [facility] OR;  Service: Neurosurgery;  Laterality: Left;  Laminectomy for facet/synovial cyst - left - Lumbar four - lumbar five   POLYPECTOMY N/A 11/04/2017   Procedure: POLYPECTOMY INTESTINAL;  Surgeon: Toney Reil, MD;  Location: [facility];  Service: Endoscopy;  Laterality: N/A;   SHOULDER SURGERY Left    x2   SPINE SURGERY N/A    [employer]reesia 08/07/2019   TOE SURGERY Bilateral    bone spurs    ROS: Review of Systems Negative except as stated above  PHYSICAL EXAM: BP 123/85 (BP Location: Right Arm, Patient Position: Sitting, Cuff Size: Normal)   Pulse 72   Temp 98.1 F (36.7 C)   Resp 12   Ht 5\' 1"  (1.549 m)   Wt 138 lb 6.4 oz (62.8 kg)   SpO2 97%   BMI 26.15 kg/m   Physical Exam HENT:     Head: Normocephalic and atraumatic.     Nose: Nose normal.     Mouth/Throat:     Mouth: Mucous membranes are moist.     Pharynx: Oropharynx is clear.  Eyes:     Extraocular Movements: Extraocular movements intact.     Conjunctiva/sclera: Conjunctivae normal.     Pupils: Pupils are equal, round, and reactive to light.  Cardiovascular:     Rate and Rhythm: Normal rate and regular rhythm.     Pulses: Normal pulses.     Heart sounds: Normal heart sounds.  Pulmonary:     Effort: Pulmonary effort is normal.     Breath sounds: Normal breath sounds.  Musculoskeletal:     Cervical back: Normal range  of motion and neck supple.  Neurological:     General: No focal deficit present.     Mental Status: She is alert and oriented to person, place, and time.  Psychiatric:        Mood and Affect: Mood normal.         Behavior: Behavior normal.      ASSESSMENT AND PLAN: 1. Upper respiratory symptom - Patient today in no cardiopulmonary/acute distress.  - Hycodan syrup, Azithromycin, and Prednisone as prescribed. Counseled on medication adherence/adverse effects.  - Routine screening.  - Keep all scheduled appointments with Allergy.  - Patient declined referral to Pulmonology.  - Follow-up with primary provider as scheduled.  - COVID-19, Flu A+B and RSV - HYDROcodone bit-homatropine (HYCODAN) 5-1.5 MG/5ML syrup; Take 5 mLs by mouth every 6 (six) hours as needed for cough.  Dispense: 120 mL; Refill: 0 - azithromycin (ZITHROMAX) 250 MG tablet; Take 2 tablets on day 1, then 1 tablet daily on days 2 through 5  Dispense: 6 tablet; Refill: 0 - predniSONE (DELTASONE) 10 MG tablet; Take 6 tablets (60 mg total) by mouth daily with breakfast for 1 day, THEN 5 tablets (50 mg total) daily with breakfast for 1 day, THEN 4 tablets (40 mg total) daily with breakfast for 1 day, THEN 3 tablets (30 mg total) daily with breakfast for 1 day, THEN 2 tablets (20 mg total) daily with breakfast for 1 day, THEN 1 tablet (10 mg total) daily with breakfast for 1 day.  Dispense: 21 tablet; Refill: 0   Patient was given the opportunity to ask questions.  Patient verbalized understanding of the plan and was able to repeat key elements of the plan. Patient was given clear instructions to go to Emergency Department or return to medical center if symptoms don't improve, worsen, or new problems develop.The patient verbalized understanding.   Orders Placed This Encounter  Procedures   COVID-19, Flu A+B and RSV     Requested Prescriptions   Signed Prescriptions Disp Refills   HYDROcodone bit-homatropine (HYCODAN) 5-1.5 MG/5ML syrup 120 mL 0    Sig: Take 5 mLs by mouth every 6 (six) hours as needed for cough.   azithromycin (ZITHROMAX) 250 MG tablet 6 tablet 0    Sig: Take 2 tablets on day 1, then 1 tablet daily on days 2 through  5   predniSONE (DELTASONE) 10 MG tablet 21 tablet 0    Sig: Take 6 tablets (60 mg total) by mouth daily with breakfast for 1 day, THEN 5 tablets (50 mg total) daily with breakfast for 1 day, THEN 4 tablets (40 mg total) daily with breakfast for 1 day, THEN 3 tablets (30 mg total) daily with breakfast for 1 day, THEN 2 tablets (20 mg total) daily with breakfast for 1 day, THEN 1 tablet (10 mg total) daily with breakfast for 1 day.    Return for Follow-Up or next available Georganna Skeans, MD.  Rema Fendt, NP

## 2022-08-12 ENCOUNTER — Encounter: Payer: Self-pay | Admitting: Family

## 2022-08-12 ENCOUNTER — Telehealth: Payer: Self-pay | Admitting: *Deleted

## 2022-08-12 ENCOUNTER — Ambulatory Visit (INDEPENDENT_AMBULATORY_CARE_PROVIDER_SITE_OTHER): Payer: Medicare HMO | Admitting: Family

## 2022-08-12 VITALS — BP 123/85 | HR 72 | Temp 98.1°F | Resp 12 | Ht 61.0 in | Wt 138.4 lb

## 2022-08-12 DIAGNOSIS — R0989 Other specified symptoms and signs involving the circulatory and respiratory systems: Secondary | ICD-10-CM | POA: Diagnosis not present

## 2022-08-12 MED ORDER — AZITHROMYCIN 250 MG PO TABS
ORAL_TABLET | ORAL | 0 refills | Status: AC
Start: 2022-08-12 — End: 2022-08-17

## 2022-08-12 MED ORDER — PREDNISONE 10 MG PO TABS
ORAL_TABLET | ORAL | 0 refills | Status: AC
Start: 2022-08-12 — End: 2022-08-18

## 2022-08-12 MED ORDER — HYDROCODONE BIT-HOMATROP MBR 5-1.5 MG/5ML PO SOLN
5.0000 mL | Freq: Four times a day (QID) | ORAL | 0 refills | Status: DC | PRN
Start: 2022-08-12 — End: 2023-10-27

## 2022-08-12 NOTE — Progress Notes (Signed)
Pt is here for sick visit   Complaining of cough, sore throat and sinus congestion on and off for X2 months Pt thinks it is seasonal allergies, was given antibiotic in the past and did not help per pt

## 2022-08-12 NOTE — Telephone Encounter (Signed)
Form has been resent to [employer]   [phone] Copied from CRM [id number]. Topic: General - Other >> Aug 11, 2022  3:25 PM Santiya F wrote: Reason for CRM: Cordelia Pen from Dr. Britt Boozer office is calling in because they sent over a request back in January for surgery clearance, and haven't heard anything back and pt is needing a total knee replacement. [phone] -

## 2022-08-13 LAB — COVID-19, FLU A+B AND RSV
Influenza A, NAA: NOT DETECTED
Influenza B, NAA: NOT DETECTED
RSV, NAA: NOT DETECTED
SARS-CoV-2, NAA: NOT DETECTED

## 2022-08-20 ENCOUNTER — Telehealth: Payer: Self-pay | Admitting: Family Medicine

## 2022-08-20 NOTE — Telephone Encounter (Signed)
Copied from CRM [id number]. Topic: Referral - Request for Referral >> Aug 20, 2022  2:08 PM Carrielelia G wrote: Spots on both legs and possible body.  Referral needed to a dermatologist

## 2022-08-21 NOTE — Telephone Encounter (Signed)
Referral request

## 2022-08-28 ENCOUNTER — Telehealth (HOSPITAL_COMMUNITY): Payer: Medicare HMO | Admitting: Student in an Organized Health Care Education/Training Program

## 2022-08-28 ENCOUNTER — Encounter (HOSPITAL_COMMUNITY): Payer: Self-pay

## 2022-08-28 NOTE — Progress Notes (Unsigned)
Patient not able to do video visit. Did call patient and patient endorsed that she had no intent to harm herself and did not currently need any refills on her medication. Patient endorsed she would appreciated being rescheduled with her new provider in the next 2 months.    PGY-3 Eliseo Gum, MD

## 2022-08-28 NOTE — Progress Notes (Deleted)
Virtual Visit via Video Note  I connected with [name] on 08/28/22 at  2:00 PM EDT by a video enabled telemedicine application and verified that I am speaking with the correct person using two identifiers.  Location: Patient: *** Provider: ***   I discussed the limitations of evaluation and management by telemedicine and the availability of in person appointments. The patient expressed understanding and agreed to proceed.    I discussed the assessment and treatment plan with the patient. The patient was provided an opportunity to ask questions and all were answered. The patient agreed with the plan and demonstrated an understanding of the instructions.   The patient was advised to call back or seek an in-person evaluation if the symptoms worsen or if the condition fails to improve as anticipated.  I provided *** minutes of non-face-to-face time during this encounter.   Bobbye Morton, MD  [facility] MD/PA/NP OP Progress Note  08/28/2022 11:49 AM [name]  MRN:  [id number]  Chief Complaint: No chief complaint on file.  HPI: Ms . Varas is a 57year old female . She was seeing Dr.Pucilowski for outpatient psychiatric management . He has retired ( last seen by Dr Demetrius Charity. On 03/15/2020 ).Then Dr. Jama Flavors until 05/2022.    She has a PPH of MDD vs Bipolar d/o and GAD, charted hx of Borderline Personality d/o and Bipolar 2 d/o. Also hx of cocaine use d/o and Etoh use d/o.    Current medication regimen: Welllbutrin XL 300 mgr QDAY for depression  Zonisamide 100 mgr QHS for mood disorder Morphine 15mg  BID, for Chronic back pain, rx by surgeon  Gabapentin 600mg  BID, rx by surgeon Visit Diagnosis: No diagnosis found.  Past Psychiatric History: INPT: Multiple times some were IVC, but not in the last 10 years. Dx: Borderline Personality, PTSD, Bipolar, GAD OPT: Yes Medications: Lamictal ( had withdrawal w/o it), Effexor (severe discontinuation syndrome), zoloft (failed but was also using a lot  Etoh), Paxil (was on for years until back surgery not sure it worked), Lexapro, Celexa, Depakote ( did not like), Geodon, Zyprexa,Invega     No hx of Prozac, Cymbalta, Lithium, Abilify,    Hx of Etoh use d/c  Past Medical History:  Past Medical History:  Diagnosis Date   Anxiety    Arthritis    neck, knees, shoulders   Asthma    Bipolar disorder (HCC)    currently feeling MANIC- 10/02/2016   Depression    GERD (gastroesophageal reflux disease)    Hip fracture (HCC) 06/2019   History of blood transfusion    as a newborn    History of lump of left breast    Hyperlipidemia    Lumbar pseudoarthrosis    Motion sickness    cars   OSA (obstructive sleep apnea) 01/09/2016   can't afford CPAP   Personality disorder (HCC)    PONV (postoperative nausea and vomiting)    Post traumatic stress disorder (PTSD)    Substance abuse (HCC)    Synovial cyst     Past Surgical History:  Procedure Laterality Date   COLONOSCOPY WITH PROPOFOL N/A 11/04/2017   Procedure: COLONOSCOPY WITH PROPOFOL;  Surgeon: Toney Reil, MD;  Location: [facility];  Service: Endoscopy;  Laterality: N/A;   ESOPHAGOGASTRODUODENOSCOPY (EGD) WITH PROPOFOL N/A 11/04/2017   Procedure: ESOPHAGOGASTRODUODENOSCOPY (EGD) WITH PROPOFOL with biopsies;  Surgeon: Toney Reil, MD;  Location: [facility];  Service: Endoscopy;  Laterality: N/A;  sleep apnea   KNEE SURGERY  Left    x5, post basketball injury   LUMBAR LAMINECTOMY/DECOMPRESSION MICRODISCECTOMY Left 01/22/2016   Procedure: Laminectomy for facet/synovial cyst - left - Lumbar four - lumbar five;  Surgeon: Julio Sicks, MD;  Location[facility];  Service: Neurosurgery;  Laterality: Left;  Laminectomy for facet/synovial cyst - left - Lumbar four - lumbar five   POLYPECTOMY N/A 11/04/2017   Procedure: POLYPECTOMY INTESTINAL;  Surgeon: Toney Reil, MD;  Location: [facility];  Service: Endoscopy;  Laterality: N/A;   SHOULDER SURGERY Left     x2   SPINE SURGERY N/A    Phreesia 08/07/2019   TOE SURGERY Bilateral    bone spurs    Family Psychiatric History: Brother- Hx of Etoh use d/o   Family History:  Family History  Problem Relation Age of Onset   Heart disease Father    Hyperlipidemia Father    Alcohol abuse Brother    Alcohol abuse Paternal Uncle    Breast cancer Maternal Aunt        70's   Colon cancer Neg Hx     Social History:  Social History   Socioeconomic History   Marital status: Single    Spouse name: Not on file   Number of children: 0   Years of education: Not on file   Highest education level: Bachelor's degree (e.g., BA, AB, BS)  Occupational History   Not on file  Tobacco Use   Smoking status: Former    Packs/day: 1.00    Years: 1.00    Additional pack years: 0.00    Total pack years: 1.00    Types: Cigarettes    Quit date: 04/15/1996    Years since quitting: 26.3    Passive exposure: Past   Smokeless tobacco: Never   Tobacco comments:    started back again in 2016, smoked for about 6 mo and then quit  Vaping Use   Vaping Use: Never used  Substance and Sexual Activity   Alcohol use: No    Alcohol/week: 0.0 standard drinks of alcohol    Comment: quit 20 years   Drug use: Yes    Frequency: 14.0 times per week    Types: Marijuana    Comment: 20 yrs. ago- cocaine    Sexual activity: Not Currently  Other Topics Concern   Not on file  Social History Narrative   Not on file   Social Determinants of Health   Financial Resource Strain: Medium Risk (07/01/2022)   Overall Financial Resource Strain (CARDIA)    Difficulty of Paying Living Expenses: Somewhat hard  Food Insecurity: No Food Insecurity (07/01/2022)   Hunger Vital Sign    Worried About Running Out of Food in the Last Year: Never true    Ran Out of Food in the Last Year: Never true  Transportation Needs: No Transportation Needs (07/01/2022)   PRAPARE - Administrator, Civil Service (Medical): No    Lack of  Transportation (Non-Medical): No  Physical Activity: Unknown (07/01/2022)   Exercise Vital Sign    Days of Exercise per Week: Patient declined    Minutes of Exercise per Session: Not on file  Stress: Patient Declined (07/01/2022)   [employer] of Occupational Health - Occupational Stress Questionnaire    Feeling of Stress : Patient declined  Social Connections: Moderately Isolated (07/01/2022)   Social Connection and Isolation Panel [NHANES]    Frequency of Communication with Friends and Family: More than three times a week    Frequency  of Social Gatherings with Friends and Family: More than three times a week    Attends Religious Services: 1 to 4 times per year    Active Member of [employer] or Organizations: No    Attends Engineer, structural: Not on file    Marital Status: Never married    Allergies:  Allergies  Allergen Reactions   Other    Effexor [Venlafaxine] Other (See Comments)    UNSPECIFIED REACTION, headaches, felt funny, withdrawal with missed dose    Lamotrigine Rash and Other (See Comments)    Metabolic Disorder Labs: Lab Results  Component Value Date   HGBA1C 5.6 09/17/2021   No results found for: "PROLACTIN" Lab Results  Component Value Date   CHOL 188 09/17/2021   TRIG 169 (H) 09/17/2021   HDL 69 09/17/2021   CHOLHDL 2.7 09/17/2021   VLDL 52.8 06/22/2018   LDLCALC 90 09/17/2021   LDLCALC 79 08/10/2019   Lab Results  Component Value Date   TSH 1.480 09/17/2021   TSH 1.310 04/05/2020    Therapeutic Level Labs: No results found for: "LITHIUM" No results found for: "VALPROATE" No results found for: "CBMZ"  Current Medications: Current Outpatient Medications  Medication Sig Dispense Refill   albuterol (VENTOLIN HFA) 108 (90 Base) MCG/ACT inhaler INHALE 2 PUFFS BY MOUTH EVERY 4 TO 6 HOURS AS NEEDED 18 each 1   amoxicillin-clavulanate (AUGMENTIN) 875-125 MG tablet Take 1 tablet by mouth 2 (two) times daily. 20 tablet 0   aspirin 81 MG  tablet Take 81 mg by mouth daily.     Azelastine HCl 137 MCG/SPRAY SOLN *NEED OFFICE VISIT* PLACE 2 SPRAYS INTO BOTH NOSTRILS 2 (TWO) TIMES DAILY 30 mL 3   benzonatate (TESSALON) 100 MG capsule Take 1 capsule (100 mg total) by mouth every 8 (eight) hours as needed for cough. 21 capsule 0   buPROPion (WELLBUTRIN XL) 300 MG 24 hr tablet Take 1 tablet (300 mg total) by mouth daily. 30 tablet 3   Calcium Carb-Cholecalciferol (OYSTER SHELL CALCIUM W/D) 500-5 MG-MCG TABS TAKE 1 TABLET BY MOUTH THREE TIMES A DAY 90 tablet 6   calcium-vitamin D (OSCAL WITH D) 500-200 MG-UNIT tablet Take 1 tablet by mouth 3 (three) times daily. 90 tablet 6   clonazePAM (KLONOPIN) 0.5 MG tablet Take 1 tablet (0.5 mg total) by mouth 2 (two) times daily as needed for anxiety. 20 tablet 1   cyclobenzaprine (FLEXERIL) 10 MG tablet Take 10 mg by mouth 2 (two) times daily as needed.     fluticasone (FLONASE) 50 MCG/ACT nasal spray Place 2 sprays into both nostrils daily. 16 g 5   Fluticasone-Umeclidin-Vilant (TRELEGY ELLIPTA) 200-62.5-25 MCG/ACT AEPB Inhale 1 puff into the lungs daily. 60 each 5   gabapentin (NEURONTIN) 300 MG capsule Take 1 capsule by mouth 3 (three) times daily.     HYDROcodone bit-homatropine (HYCODAN) 5-1.5 MG/5ML syrup Take 5 mLs by mouth every 6 (six) hours as needed for cough. 120 mL 0   ipratropium (ATROVENT) 0.03 % nasal spray 2 sprays each nostril twice a day as needed for runny nose/drainage down throat. 30 mL 5   levocetirizine (XYZAL) 5 MG tablet Take 1 tablet daily as needed. 30 tablet 5   mepolizumab (NUCALA) 100 MG/ML SOSY INJECT 100MG  SUBCUTANEOUSLY EVERY 4 WEEKS 1 mL 11   montelukast (SINGULAIR) 10 MG tablet TAKE 1 TABLET BY MOUTH EVERYDAY AT BEDTIME 30 tablet 5   morphine (MS CONTIN) 15 MG 12 hr tablet Take 15 mg by mouth every  12 (twelve) hours.     morphine (MSIR) 15 MG tablet Take 15 mg by mouth every 4 (four) hours as needed for severe pain.     Olopatadine HCl 0.2 % SOLN Place 1 drop in  each eye once a day as needed for itchy watery eyes 2.5 mL 5   pantoprazole (PROTONIX) 40 MG tablet Take 1 tablet (40 mg total) by mouth daily. 30 tablet 5   promethazine (PHENERGAN) 25 MG tablet Take 1 tablet (25 mg total) by mouth every 8 (eight) hours as needed for nausea or vomiting. 20 tablet 0   simvastatin (ZOCOR) 20 MG tablet TAKE 1 TABLET BY MOUTH EVERY DAY AT 6PM 90 tablet 0   triamcinolone (NASACORT) 55 MCG/ACT AERO nasal inhaler      zonisamide (ZONEGRAN) 100 MG capsule Take 1 capsule (100 mg total) by mouth at bedtime. 30 capsule 3   Current Facility-Administered Medications  Medication Dose Route Frequency Provider Last Rate Last Admin   mepolizumab (NUCALA) injection 100 mg  100 mg Subcutaneous Q28 days Marcelyn Bruins, MD   100 mg at 08/05/22 1610     Musculoskeletal: Strength & Muscle Tone: {desc; muscle tone:32375} Gait & Station: {PE GAIT ED RUEA:54098} Patient leans: {Patient Leans:21022755}  Psychiatric Specialty Exam: Review of Systems  There were no vitals taken for this visit.There is no height or weight on file to calculate BMI.  General Appearance: {Appearance:22683}  Eye Contact:  {BHH EYE CONTACT:22684}  Speech:  {Speech:22685}  Volume:  {Volume (PAA):22686}  Mood:  {BHH MOOD:22306}  Affect:  {Affect (PAA):22687}  Thought Process:  {Thought Process (PAA):22688}  Orientation:  {BHH ORIENTATION (PAA):22689}  Thought Content: {Thought Content:22690}   Suicidal Thoughts:  {ST/HT (PAA):22692}  Homicidal Thoughts:  {ST/HT (PAA):22692}  Memory:  {BHH MEMORY:22881}  Judgement:  {Judgement (PAA):22694}  Insight:  {Insight (PAA):22695}  Psychomotor Activity:  {Psychomotor (PAA):22696}  Concentration:  {Concentration:21399}  Recall:  {BHH GOOD/FAIR/POOR:22877}  Fund of Knowledge: {BHH GOOD/FAIR/POOR:22877}  Language: {BHH GOOD/FAIR/POOR:22877}  Akathisia:  {BHH YES OR NO:22294}  Handed:  {Handed:22697}  AIMS (if indicated): {Desc; done/not:10129}   Assets:  {Assets (PAA):22698}  ADL's:  {BHH JXB'J:47829}  Cognition: {chl bhh cognition:304700322}  Sleep:  {BHH GOOD/FAIR/POOR:22877}   Screenings: GAD-7    Flowsheet Row Office Visit from 07/15/2022 in [facility] Office Visit from 07/01/2022 in [facility] at [facility]  Total GAD-7 Score 7 0      Mini-Mental    Flowsheet Row Office Visit from 04/17/2020 in [facility] at [facility]  Total Score (max 30 points ) 30      PHQ2-9    Flowsheet Row Office Visit from 07/15/2022 in [facility] Office Visit from 07/01/2022 in [facility] at [facility] Office Visit from 02/26/2022 in [facility] at [facility] Office Visit from 01/27/2022 in [facility] at [facility] Office Visit from 09/17/2021 in [facility] at [facility]  PHQ-2 Total Score 2 0 0 0 1  PHQ-9 Total Score 8 0 0 0 6      Flowsheet Row ED from 05/14/2022 in [facility] ED from 03/13/2022 in [facility] RISK CATEGORY No Risk No Risk        Assessment and Plan: ***  Collaboration of Care: Collaboration of Care: [facility] OP Collaboration of FAOZ:[id number]}  Patient/Guardian  was advised Release of Information must be obtained prior to any record release in order to collaborate their care with an outside provider. Patient/Guardian was advised if they have not already done so to contact the registration department to sign all necessary forms in order for [place] to release information regarding their care.   Consent: Patient/Guardian gives verbal consent for treatment and assignment of benefits for services provided during this visit. Patient/Guardian expressed understanding and agreed to proceed.    Bobbye Morton, MD 08/28/2022, 11:49 AM

## 2022-09-02 ENCOUNTER — Ambulatory Visit (INDEPENDENT_AMBULATORY_CARE_PROVIDER_SITE_OTHER): Payer: Medicare HMO

## 2022-09-02 DIAGNOSIS — J4550 Severe persistent asthma, uncomplicated: Secondary | ICD-10-CM

## 2022-09-07 ENCOUNTER — Other Ambulatory Visit: Payer: Self-pay | Admitting: Allergy

## 2022-09-22 ENCOUNTER — Ambulatory Visit
Admission: EM | Admit: 2022-09-22 | Discharge: 2022-09-22 | Disposition: A | Discharge status: Home / Self Care | Payer: Medicare HMO | Attending: Internal Medicine | Admitting: Internal Medicine

## 2022-09-22 ENCOUNTER — Ambulatory Visit (INDEPENDENT_AMBULATORY_CARE_PROVIDER_SITE_OTHER): Payer: Medicare HMO

## 2022-09-22 DIAGNOSIS — R0781 Pleurodynia: Secondary | ICD-10-CM

## 2022-09-22 DIAGNOSIS — W19XXXA Unspecified fall, initial encounter: Secondary | ICD-10-CM | POA: Diagnosis not present

## 2022-09-22 NOTE — ED Triage Notes (Signed)
Pt c/o right sided rib pain after fall this morning; pt was getting out of her [place] and slipped on the foot rail that helps you get up into the vehicle,she landed on her side on the seat causing rib pain, hurts to breath.
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
## 2022-09-22 NOTE — Discharge Instructions (Addendum)
Your x-ray was normal.  Suspect contusion/bruising.  Apply ice and take pain medicine as needed.  Follow with orthopedist if symptoms persist or worsen.

## 2022-09-25 DIAGNOSIS — M5412 Radiculopathy, cervical region: Secondary | ICD-10-CM | POA: Diagnosis not present

## 2022-09-29 ENCOUNTER — Ambulatory Visit (HOSPITAL_COMMUNITY): Payer: Medicare HMO | Admitting: Student

## 2022-09-30 ENCOUNTER — Ambulatory Visit (INDEPENDENT_AMBULATORY_CARE_PROVIDER_SITE_OTHER): Payer: Medicare HMO | Admitting: Family Medicine

## 2022-09-30 ENCOUNTER — Ambulatory Visit (INDEPENDENT_AMBULATORY_CARE_PROVIDER_SITE_OTHER): Payer: Medicare HMO

## 2022-09-30 ENCOUNTER — Encounter: Payer: Self-pay | Admitting: Family Medicine

## 2022-09-30 VITALS — BP 125/74 | HR 82 | Temp 98.1°F | Resp 18 | Ht 65.0 in | Wt 135.0 lb

## 2022-09-30 DIAGNOSIS — Z0000 Encounter for general adult medical examination without abnormal findings: Secondary | ICD-10-CM | POA: Diagnosis not present

## 2022-09-30 DIAGNOSIS — Z13228 Encounter for screening for other metabolic disorders: Secondary | ICD-10-CM | POA: Diagnosis not present

## 2022-09-30 DIAGNOSIS — Z13220 Encounter for screening for lipoid disorders: Secondary | ICD-10-CM

## 2022-09-30 DIAGNOSIS — Z130 Encounter for screening for diseases of the blood and blood-forming organs and certain disorders involving the immune mechanism: Secondary | ICD-10-CM

## 2022-09-30 DIAGNOSIS — Z1329 Encounter for screening for other suspected endocrine disorder: Secondary | ICD-10-CM | POA: Diagnosis not present

## 2022-09-30 DIAGNOSIS — J4550 Severe persistent asthma, uncomplicated: Secondary | ICD-10-CM

## 2022-09-30 DIAGNOSIS — Z1211 Encounter for screening for malignant neoplasm of colon: Secondary | ICD-10-CM

## 2022-09-30 NOTE — Progress Notes (Unsigned)
-  Patient is here to have annually  complete physical examination  -Care gap address -labs taken  

## 2022-10-01 ENCOUNTER — Encounter: Payer: Self-pay | Admitting: Family Medicine

## 2022-10-01 NOTE — Progress Notes (Signed)
Established Patient Office Visit  Subjective    Patient ID: [name], female    DOB: [birth year]  Age: 57 y.o. MRN: [id number]  CC:  Chief Complaint  Patient presents with   Annual Exam    HPI [name] presents for routine annual exam. Patient denies acute complaints.    Outpatient Encounter Medications as of 09/30/2022  Medication Sig   albuterol (VENTOLIN HFA) 108 (90 Base) MCG/ACT inhaler INHALE 2 PUFFS BY MOUTH EVERY 4 TO 6 HOURS AS NEEDED   amoxicillin-clavulanate (AUGMENTIN) 875-125 MG tablet Take 1 tablet by mouth 2 (two) times daily.   aspirin 81 MG tablet Take 81 mg by mouth daily.   Azelastine HCl 137 MCG/SPRAY SOLN *NEED OFFICE VISIT* PLACE 2 SPRAYS INTO BOTH NOSTRILS 2 (TWO) TIMES DAILY   benzonatate (TESSALON) 100 MG capsule Take 1 capsule (100 mg total) by mouth every 8 (eight) hours as needed for cough.   buPROPion (WELLBUTRIN XL) 300 MG 24 hr tablet Take 1 tablet (300 mg total) by mouth daily.   Calcium Carb-Cholecalciferol (OYSTER SHELL CALCIUM W/D) 500-5 MG-MCG TABS TAKE 1 TABLET BY MOUTH THREE TIMES A DAY   calcium-vitamin D (OSCAL WITH D) 500-200 MG-UNIT tablet Take 1 tablet by mouth 3 (three) times daily.   clonazePAM (KLONOPIN) 0.5 MG tablet Take 1 tablet (0.5 mg total) by mouth 2 (two) times daily as needed for anxiety.   cyclobenzaprine (FLEXERIL) 10 MG tablet Take 10 mg by mouth 2 (two) times daily as needed.   fluticasone (FLONASE) 50 MCG/ACT nasal spray SPRAY 2 SPRAYS INTO EACH NOSTRIL EVERY DAY   Fluticasone-Umeclidin-Vilant (TRELEGY ELLIPTA) 200-62.5-25 MCG/ACT AEPB Inhale 1 puff into the lungs daily.   formoterol (PERFOROMIST) 20 MCG/2ML nebulizer solution SMARTSIG:Via Inhaler   gabapentin (NEURONTIN) 300 MG capsule Take 1 capsule by mouth 3 (three) times daily.   HYDROcodone bit-homatropine (HYCODAN) 5-1.5 MG/5ML syrup Take 5 mLs by mouth every 6 (six) hours as needed for cough.   ipratropium (ATROVENT) 0.03 % nasal spray 2 sprays each  nostril twice a day as needed for runny nose/drainage down throat.   levocetirizine (XYZAL) 5 MG tablet TAKE 1 TABLET EVERY DAY AS NEEDED   mepolizumab (NUCALA) 100 MG/ML SOSY INJECT 100MG  SUBCUTANEOUSLY EVERY 4 WEEKS   montelukast (SINGULAIR) 10 MG tablet TAKE 1 TABLET BY MOUTH EVERYDAY AT BEDTIME   morphine (MS CONTIN) 15 MG 12 hr tablet Take 15 mg by mouth every 12 (twelve) hours.   morphine (MSIR) 15 MG tablet Take 15 mg by mouth every 4 (four) hours as needed for severe pain.   Olopatadine HCl 0.2 % SOLN Place 1 drop in each eye once a day as needed for itchy watery eyes   pantoprazole (PROTONIX) 40 MG tablet Take 1 tablet (40 mg total) by mouth daily.   promethazine (PHENERGAN) 25 MG tablet Take 1 tablet (25 mg total) by mouth every 8 (eight) hours as needed for nausea or vomiting.   simvastatin (ZOCOR) 20 MG tablet TAKE 1 TABLET BY MOUTH EVERY DAY AT 6PM   triamcinolone (NASACORT) 55 MCG/ACT AERO nasal inhaler    zonisamide (ZONEGRAN) 100 MG capsule Take 1 capsule (100 mg total) by mouth at bedtime.   Facility-Administered Encounter Medications as of 09/30/2022  Medication   mepolizumab (NUCALA) injection 100 mg    Past Medical History:  Diagnosis Date   Anxiety    Arthritis    neck, knees, shoulders   Asthma    Bipolar disorder (HCC)  currently feeling MANIC- 10/02/2016   Depression    GERD (gastroesophageal reflux disease)    Hip fracture (HCC) 06/2019   History of blood transfusion    as a newborn    History of lump of left breast    Hyperlipidemia    Lumbar pseudoarthrosis    Motion sickness    cars   OSA (obstructive sleep apnea) 01/09/2016   can't afford CPAP   Personality disorder (HCC)    PONV (postoperative nausea and vomiting)    Post traumatic stress disorder (PTSD)    Substance abuse (HCC)    Synovial cyst     Past Surgical History:  Procedure Laterality Date   COLONOSCOPY WITH PROPOFOL N/A 11/04/2017   Procedure: COLONOSCOPY WITH PROPOFOL;  Surgeon:  Toney Reil, MD;  Location: [facility];  Service: Endoscopy;  Laterality: N/A;   ESOPHAGOGASTRODUODENOSCOPY (EGD) WITH PROPOFOL N/A 11/04/2017   Procedure: ESOPHAGOGASTRODUODENOSCOPY (EGD) WITH PROPOFOL with biopsies;  Surgeon: Toney Reil, MD;  Location: [facility];  Service: Endoscopy;  Laterality: N/A;  sleep apnea   KNEE SURGERY Left    x5, post basketball injury   LUMBAR LAMINECTOMY/DECOMPRESSION MICRODISCECTOMY Left 01/22/2016   Procedure: Laminectomy for facet/synovial cyst - left - Lumbar four - lumbar five;  Surgeon: Julio Sicks, MD;  Location: [facility] OR;  Service: Neurosurgery;  Laterality: Left;  Laminectomy for facet/synovial cyst - left - Lumbar four - lumbar five   POLYPECTOMY N/A 11/04/2017   Procedure: POLYPECTOMY INTESTINAL;  Surgeon: Toney Reil, MD;  Location: [facility];  Service: Endoscopy;  Laterality: N/A;   SHOULDER SURGERY Left    x2   SPINE SURGERY N/A    Phreesia 08/07/2019   TOE SURGERY Bilateral    bone spurs    Family History  Problem Relation Age of Onset   Heart disease Father    Hyperlipidemia Father    Alcohol abuse Brother    Alcohol abuse Paternal Uncle    Breast cancer Maternal Aunt        70's   Colon cancer Neg Hx     Social History   Socioeconomic History   Marital status: Single    Spouse name: Not on file   Number of children: 0   Years of education: Not on file   Highest education level: Bachelor's degree (e.g., BA, AB, BS)  Occupational History   Not on file  Tobacco Use   Smoking status: Former    Current packs/day: 0.00    Average packs/day: 1 pack/day for 1 year (1.0 ttl pk-yrs)    Types: Cigarettes    Start date: 04/16/1995    Quit date: 04/15/1996    Years since quitting: 26.4    Passive exposure: Past   Smokeless tobacco: Never   Tobacco comments:    started back again in 2016, smoked for about 6 mo and then quit  Vaping Use   Vaping status: Never Used  Substance and Sexual  Activity   Alcohol use: No    Alcohol/week: 0.0 standard drinks of alcohol    Comment: quit 20 years   Drug use: Yes    Frequency: 14.0 times per week    Types: Marijuana    Comment: 20 yrs. ago- cocaine    Sexual activity: Not Currently  Other Topics Concern   Not on file  Social History Narrative   Not on file   Social Determinants of Health   Financial Resource Strain: Medium Risk (07/01/2022)   Overall Financial Resource  Strain (CARDIA)    Difficulty of Paying Living Expenses: Somewhat hard  Food Insecurity: No Food Insecurity (07/01/2022)   Hunger Vital Sign    Worried About Running Out of Food in the Last Year: Never true    Ran Out of Food in the Last Year: Never true  Transportation Needs: No Transportation Needs (07/01/2022)   PRAPARE - Administrator, Civil Service (Medical): No    Lack of Transportation (Non-Medical): No  Physical Activity: Unknown (07/01/2022)   Exercise Vital Sign    Days of Exercise per Week: Patient declined    Minutes of Exercise per Session: Not on file  Stress: Patient Declined (07/01/2022)   [employer] of Occupational Health - Occupational Stress Questionnaire    Feeling of Stress : Patient declined  Social Connections: Moderately Isolated (07/01/2022)   Social Connection and Isolation Panel [NHANES]    Frequency of Communication with Friends and Family: More than three times a week    Frequency of Social Gatherings with Friends and Family: More than three times a week    Attends Religious Services: 1 to 4 times per year    Active Member of [employer] or Organizations: No    Attends Engineer, structural: Not on file    Marital Status: Never married  Intimate Partner Violence: Not on file    Review of Systems  All other systems reviewed and are negative.       Objective    BP 125/74   Pulse 82   Temp 98.1 F (36.7 C)   Resp 18   Ht 5\' 5"  (1.651 m)   Wt 135 lb (61.2 kg)   SpO2 96%   BMI 22.47 kg/m    Physical Exam Vitals and nursing note reviewed.  Constitutional:      General: She is not in acute distress. HENT:     Head: Normocephalic and atraumatic.     Right Ear: Tympanic membrane, ear canal and external ear normal.     Left Ear: Tympanic membrane, ear canal and external ear normal.     Nose: Nose normal.     Mouth/Throat:     Mouth: Mucous membranes are moist.     Pharynx: Oropharynx is clear.  Eyes:     Conjunctiva/sclera: Conjunctivae normal.     Pupils: Pupils are equal, round, and reactive to light.  Neck:     Thyroid: No thyromegaly.  Cardiovascular:     Rate and Rhythm: Normal rate and regular rhythm.     Heart sounds: Normal heart sounds. No murmur heard. Pulmonary:     Effort: Pulmonary effort is normal. No respiratory distress.     Breath sounds: Normal breath sounds.  Abdominal:     General: There is no distension.     Palpations: Abdomen is soft. There is no mass.     Tenderness: There is no abdominal tenderness.  Musculoskeletal:        General: Normal range of motion.     Cervical back: Normal range of motion and neck supple.  Skin:    General: Skin is warm and dry.  Neurological:     General: No focal deficit present.     Mental Status: She is alert and oriented to person, place, and time.  Psychiatric:        Mood and Affect: Mood normal.        Behavior: Behavior normal.         Assessment & Plan:   1. Annual  physical exam  - CMP14+EGFR  2. Screening for deficiency anemia  - CBC with Differential  3. Screening for lipid disorders  - Lipid Panel  4. Screening for endocrine/metabolic/immunity disorders  - Vitamin D, 25-hydroxy  5. Screening for colon cancer  - Cologuard    No follow-ups on file.   Tommie Raymond, MD

## 2022-10-07 DIAGNOSIS — Z1211 Encounter for screening for malignant neoplasm of colon: Secondary | ICD-10-CM | POA: Diagnosis not present

## 2022-10-20 ENCOUNTER — Ambulatory Visit (HOSPITAL_BASED_OUTPATIENT_CLINIC_OR_DEPARTMENT_OTHER): Payer: Medicare HMO | Admitting: Student

## 2022-10-20 VITALS — BP 134/84 | HR 78 | Ht 60.0 in | Wt 136.0 lb

## 2022-10-20 DIAGNOSIS — Z79899 Other long term (current) drug therapy: Secondary | ICD-10-CM

## 2022-10-20 DIAGNOSIS — F1220 Cannabis dependence, uncomplicated: Secondary | ICD-10-CM | POA: Diagnosis not present

## 2022-10-20 DIAGNOSIS — F3175 Bipolar disorder, in partial remission, most recent episode depressed: Secondary | ICD-10-CM

## 2022-10-20 MED ORDER — BUPROPION HCL ER (XL) 300 MG PO TB24
300.0000 mg | ORAL_TABLET | Freq: Every day | ORAL | 3 refills | Status: DC
Start: 2022-10-20 — End: 2022-12-24

## 2022-10-20 MED ORDER — ZONISAMIDE 100 MG PO CAPS
100.0000 mg | ORAL_CAPSULE | Freq: Every day | ORAL | 3 refills | Status: DC
Start: 2022-10-20 — End: 2022-12-24

## 2022-10-20 NOTE — Progress Notes (Signed)
BH MD Outpatient Progress Note  10/20/2022 11:16 AM [name]  MRN:  [id number]  Assessment:  [name] presents for follow-up evaluation. Today, patient reports experiencing significant depression over the past several weeks secondary to a psychosocial stressor, a looming eviction.  It is notable that her depression appears to coexist with episodes of hypomania.  The patient has a previous history of bipolar affective disorder 1 as well as a more remote history of borderline personality disorder.  Cocaine use disorder and alcohol use disorder, both in sustained remission are noted as well.  The patient's only mood stabilizing medication at present is zonisamide, which has limited evidence of efficacy in manic-depressive illness.  She has been on the medication for many years.  She is also taking Wellbutrin 300 mg XL for depression.  Today she initially requested an increase in her Wellbutrin.  We discussed that given her moderate level of mood cycling, this could potentially worsen her hypomanic episodes, and even make subsequent depressive episodes more severe.  The patient was very clear, and preemptively discussed that she will not take any new medications.  It was agreed upon that the patient will continue on her current regimen, because of the limited severity of her current psychiatric symptomatology.  The patient reports a stable level with interpersonal functioning and daytime engagement, which is congruent with how she appears on interview today.  The patient feels strongly that she can reach out for help should she develop more severe symptoms.  Identifying Information: [name] is a 57 y.o. y.o. female with a history of bipolar affective disorder 1, as well as a more remote history of borderline personality disorder, as well as cocaine and alcohol use disorders, both in sustained remission, who is an established patient with [facility] for management of  depression and mood cycling.   Plan:  # Bipolar affective disorder in partial remission, currently depressed Interventions: -- Continue Wellbutrin 300 mg XL - Continue zonisamide 100 mg nightly - The patient is taking MS Contin 15 mg twice daily as well as gabapentin 300 mg twice daily and Flexeril 10 mg twice daily, prescribed by another physician  # Cannabis use disorder, moderate, dependence Interventions: -- Continue to encourage reduction use or abstinence   Patient was given contact information for behavioral health clinic and was instructed to call 911 for emergencies.   Subjective:  Chief Complaint:  Chief Complaint  Patient presents with   Follow-up    Interval History:  The patient reports that the death of her parents within the past year has been difficult for her.  She says that her father passed away in 04-05-2022 and that she should have been left an appropriate share of their property and money in the estate.  She feels that her brother cheated her out of this.  Throughout the interview, the patient demonstrates a linear and logical thought process.  She does not appear paranoid.  She reports that she has a regular social schedule, eating breakfast and dinner with her friends.  She states that her service dog is very important to her.  She reports that over the past several weeks she has been experiencing significant depressed mood.  She reports poor sleep, stating that she gets only approximately 3 to 4 hours of sleep per night.  She reports a regular appetite and says that her eating is fine.  She denies experiencing any hopelessness or suicidal thoughts.  The patient denies the use of alcohol  or cocaine.  He states that she smokes marijuana approximately twice per day because "it helps me calm down".   The patient also reports hypomanic episodes, they consist of "my mind feels like a pinball machine" (seemingly to indicate racing thoughts and distractibility), increased  goal-directed activity, decreased need for sleep, and increased self-esteem.  She states that the episodes have no trigger and last approximately 2 weeks.  She states that her previous manic episodes used to often involve serious impulsive behavior.   Visit Diagnosis:    ICD-10-CM   1. Bipolar disorder, in partial remission, most recent episode depressed (HCC)  F31.75     2. Encounter for long-term (current) use of medications  Z79.899     3. Cannabis use disorder, moderate, dependence (HCC)  F12.20       Past Psychiatric History: Last behavioral health hospitalization in 2006, which appears to have been voluntary in nature.  The patient reports that she has not attempted suicide in several decades  Past Medical History:  Past Medical History:  Diagnosis Date   Anxiety    Arthritis    neck, knees, shoulders   Asthma    Bipolar disorder (HCC)    currently feeling MANIC- 10/02/2016   Depression    GERD (gastroesophageal reflux disease)    Hip fracture (HCC) 06/2019   History of blood transfusion    as a newborn    History of lump of left breast    Hyperlipidemia    Lumbar pseudoarthrosis    Motion sickness    cars   OSA (obstructive sleep apnea) 01/09/2016   can't afford CPAP   Personality disorder (HCC)    PONV (postoperative nausea and vomiting)    Post traumatic stress disorder (PTSD)    Substance abuse (HCC)    Synovial cyst     Past Surgical History:  Procedure Laterality Date   COLONOSCOPY WITH PROPOFOL N/A 11/04/2017   Procedure: COLONOSCOPY WITH PROPOFOL;  Surgeon: Toney Reil, MD;  Location: [facility];  Service: Endoscopy;  Laterality: N/A;   ESOPHAGOGASTRODUODENOSCOPY (EGD) WITH PROPOFOL N/A 11/04/2017   Procedure: ESOPHAGOGASTRODUODENOSCOPY (EGD) WITH PROPOFOL with biopsies;  Surgeon: Toney Reil, MD;  Location: [facility];  Service: Endoscopy;  Laterality: N/A;  sleep apnea   KNEE SURGERY Left    x5, post basketball injury    LUMBAR LAMINECTOMY/DECOMPRESSION MICRODISCECTOMY Left 01/22/2016   Procedure: Laminectomy for facet/synovial cyst - left - Lumbar four - lumbar five;  Surgeon: Julio Sicks, MD;  Location: [facility] OR;  Service: Neurosurgery;  Laterality: Left;  Laminectomy for facet/synovial cyst - left - Lumbar four - lumbar five   POLYPECTOMY N/A 11/04/2017   Procedure: POLYPECTOMY INTESTINAL;  Surgeon: Toney Reil, MD;  Location: [facility];  Service: Endoscopy;  Laterality: N/A;   SHOULDER SURGERY Left    x2   SPINE SURGERY N/A    [employer]reesia 08/07/2019   TOE SURGERY Bilateral    bone spurs    Family Psychiatric History: None pertinent  Family History:  Family History  Problem Relation Age of Onset   Heart disease Father    Hyperlipidemia Father    Alcohol abuse Brother    Alcohol abuse Paternal Uncle    Breast cancer Maternal Aunt        70's   Colon cancer Neg Hx     Social History:  Social History   Socioeconomic History   Marital status: Single    Spouse name: Not on file   Number  of children: 0   Years of education: Not on file   Highest education level: Bachelor's degree (e.g., BA, AB, BS)  Occupational History   Not on file  Tobacco Use   Smoking status: Former    Current packs/day: 0.00    Average packs/day: 1 pack/day for 1 year (1.0 ttl pk-yrs)    Types: Cigarettes    Start date: 04/16/1995    Quit date: 04/15/1996    Years since quitting: 26.5    Passive exposure: Past   Smokeless tobacco: Never   Tobacco comments:    started back again in 2016, smoked for about 6 mo and then quit  Vaping Use   Vaping status: Never Used  Substance and Sexual Activity   Alcohol use: No    Alcohol/week: 0.0 standard drinks of alcohol    Comment: quit 20 years   Drug use: Yes    Frequency: 14.0 times per week    Types: Marijuana    Comment: 20 yrs. ago- cocaine    Sexual activity: Not Currently  Other Topics Concern   Not on file  Social History Narrative   Not on file    Social Determinants of Health   Financial Resource Strain: Medium Risk (07/01/2022)   Overall Financial Resource Strain (CARDIA)    Difficulty of Paying Living Expenses: Somewhat hard  Food Insecurity: No Food Insecurity (07/01/2022)   Hunger Vital Sign    Worried About Running Out of Food in the Last Year: Never true    Ran Out of Food in the Last Year: Never true  Transportation Needs: No Transportation Needs (07/01/2022)   PRAPARE - Administrator, Civil Service (Medical): No    Lack of Transportation (Non-Medical): No  Physical Activity: Unknown (07/01/2022)   Exercise Vital Sign    Days of Exercise per Week: Patient declined    Minutes of Exercise per Session: Not on file  Stress: Patient Declined (07/01/2022)   [employer] of Occupational Health - Occupational Stress Questionnaire    Feeling of Stress : Patient declined  Social Connections: Moderately Isolated (07/01/2022)   Social Connection and Isolation Panel [NHANES]    Frequency of Communication with Friends and Family: More than three times a week    Frequency of Social Gatherings with Friends and Family: More than three times a week    Attends Religious Services: 1 to 4 times per year    Active Member of [employer] or Organizations: No    Attends Banker Meetings: Not on file    Marital Status: Never married    Allergies:  Allergies  Allergen Reactions   Other    Effexor [Venlafaxine] Other (See Comments)    UNSPECIFIED REACTION, headaches, felt funny, withdrawal with missed dose    Lamotrigine Rash and Other (See Comments)    Current Medications: Current Outpatient Medications  Medication Sig Dispense Refill   albuterol (VENTOLIN HFA) 108 (90 Base) MCG/ACT inhaler INHALE 2 PUFFS BY MOUTH EVERY 4 TO 6 HOURS AS NEEDED 18 each 1   amoxicillin-clavulanate (AUGMENTIN) 875-125 MG tablet Take 1 tablet by mouth 2 (two) times daily. 20 tablet 0   aspirin 81 MG tablet Take 81 mg by mouth daily.      Azelastine HCl 137 MCG/SPRAY SOLN *NEED OFFICE VISIT* PLACE 2 SPRAYS INTO BOTH NOSTRILS 2 (TWO) TIMES DAILY 30 mL 3   benzonatate (TESSALON) 100 MG capsule Take 1 capsule (100 mg total) by mouth every 8 (eight) hours as needed for cough.  21 capsule 0   buPROPion (WELLBUTRIN XL) 300 MG 24 hr tablet Take 1 tablet (300 mg total) by mouth daily. 30 tablet 3   Calcium Carb-Cholecalciferol (OYSTER SHELL CALCIUM W/D) 500-5 MG-MCG TABS TAKE 1 TABLET BY MOUTH THREE TIMES A DAY 90 tablet 6   calcium-vitamin D (OSCAL WITH D) 500-200 MG-UNIT tablet Take 1 tablet by mouth 3 (three) times daily. 90 tablet 6   clonazePAM (KLONOPIN) 0.5 MG tablet Take 1 tablet (0.5 mg total) by mouth 2 (two) times daily as needed for anxiety. 20 tablet 1   cyclobenzaprine (FLEXERIL) 10 MG tablet Take 10 mg by mouth 2 (two) times daily as needed.     fluticasone (FLONASE) 50 MCG/ACT nasal spray SPRAY 2 SPRAYS INTO EACH NOSTRIL EVERY DAY 16 mL 2   Fluticasone-Umeclidin-Vilant (TRELEGY ELLIPTA) 200-62.5-25 MCG/ACT AEPB Inhale 1 puff into the lungs daily. 60 each 5   formoterol (PERFOROMIST) 20 MCG/2ML nebulizer solution SMARTSIG:Via Inhaler     gabapentin (NEURONTIN) 300 MG capsule Take 1 capsule by mouth 3 (three) times daily.     HYDROcodone bit-homatropine (HYCODAN) 5-1.5 MG/5ML syrup Take 5 mLs by mouth every 6 (six) hours as needed for cough. 120 mL 0   ipratropium (ATROVENT) 0.03 % nasal spray 2 sprays each nostril twice a day as needed for runny nose/drainage down throat. 30 mL 5   levocetirizine (XYZAL) 5 MG tablet TAKE 1 TABLET EVERY DAY AS NEEDED 30 tablet 2   mepolizumab (NUCALA) 100 MG/ML SOSY INJECT 100MG  SUBCUTANEOUSLY EVERY 4 WEEKS 1 mL 11   montelukast (SINGULAIR) 10 MG tablet TAKE 1 TABLET BY MOUTH EVERYDAY AT BEDTIME 30 tablet 5   morphine (MS CONTIN) 15 MG 12 hr tablet Take 15 mg by mouth every 12 (twelve) hours.     morphine (MSIR) 15 MG tablet Take 15 mg by mouth every 4 (four) hours as needed for severe pain.      Olopatadine HCl 0.2 % SOLN Place 1 drop in each eye once a day as needed for itchy watery eyes 2.5 mL 5   pantoprazole (PROTONIX) 40 MG tablet Take 1 tablet (40 mg total) by mouth daily. 30 tablet 5   promethazine (PHENERGAN) 25 MG tablet Take 1 tablet (25 mg total) by mouth every 8 (eight) hours as needed for nausea or vomiting. 20 tablet 0   simvastatin (ZOCOR) 20 MG tablet TAKE 1 TABLET BY MOUTH EVERY DAY AT 6PM 90 tablet 0   triamcinolone (NASACORT) 55 MCG/ACT AERO nasal inhaler      zonisamide (ZONEGRAN) 100 MG capsule Take 1 capsule (100 mg total) by mouth at bedtime. 30 capsule 3   Current Facility-Administered Medications  Medication Dose Route Frequency Provider Last Rate Last Admin   mepolizumab (NUCALA) injection 100 mg  100 mg Subcutaneous Q28 days Marcelyn Bruins, MD   100 mg at 09/30/22 [id number]     Objective:  Psychiatric Specialty Exam: Physical Exam Constitutional:      Appearance: the patient is not toxic-appearing.  Pulmonary:     Effort: Pulmonary effort is normal.  Neurological:     General: No focal deficit present.     Mental Status: the patient is alert and oriented to person, place, and time.   Review of Systems  Respiratory:  Negative for shortness of breath.   Cardiovascular:  Negative for chest pain.  Gastrointestinal:  Negative for abdominal pain, constipation, diarrhea, nausea and vomiting.  Neurological:  Negative for headaches.      BP 134/84  Pulse 78   Ht 5' (1.524 m)   Wt 136 lb (61.7 kg)   BMI 26.56 kg/m   General Appearance: Fairly Groomed  Eye Contact:  Good  Speech:  Clear and Coherent  Volume:  Normal  Mood:  depressed  Affect:  Congruent, somewhat depressed, able to laugh appropriately  Thought Process:  Coherent  Orientation:  Full (Time, Place, and Person)  Thought Content: Logical   Suicidal Thoughts:  No  Homicidal Thoughts:  No  Memory:  Immediate;   Good  Judgement:  fair  Insight:  fair  Psychomotor  Activity:  Normal  Concentration:  Concentration: Good  Recall:  Good  Fund of Knowledge: Good  Language: Good  Akathisia:  No  Handed:    AIMS (if indicated): not done  Assets:  Communication Skills Desire for Improvement Financial Resources/Insurance Housing Leisure Time Physical Health  ADL's:  Intact  Cognition: WNL  Sleep:  Fair     Metabolic Disorder Labs: Lab Results  Component Value Date   HGBA1C 5.6 09/17/2021   No results found for: "PROLACTIN" Lab Results  Component Value Date   CHOL 161 09/30/2022   TRIG 91 09/30/2022   HDL 74 09/30/2022   CHOLHDL 2.2 09/30/2022   VLDL 40.9 06/22/2018   LDLCALC 70 09/30/2022   LDLCALC 90 09/17/2021   Lab Results  Component Value Date   TSH 1.480 09/17/2021   TSH 1.310 04/05/2020    Therapeutic Level Labs: No results found for: "LITHIUM" No results found for: "VALPROATE" No results found for: "CBMZ"  Screenings: GAD-7    Flowsheet Row Office Visit from 07/15/2022 in [facility] Office Visit from 07/01/2022 in [facility] at [facility]  Total GAD-7 Score 7 0      Mini-Mental    Flowsheet Row Office Visit from 04/17/2020 in [facility] at [facility]  Total Score (max 30 points ) 30      PHQ2-9    Flowsheet Row Office Visit from 09/30/2022 in [facility] Office Visit from 07/15/2022 in [facility] at [facility] Office Visit from 07/01/2022 in [facility] at [facility] Office Visit from 02/26/2022 in [facility] at [facility] Office Visit from 01/27/2022 in [facility] at [facility]  PHQ-2 Total Score 0 2 0 0 0  PHQ-9 Total Score 7 8 0 0 0      Flowsheet Row ED from 09/22/2022 in [facility]) ED from 05/14/2022 in [facility] at [facility] ED from 03/13/2022 in [facility]  at [facility] RISK CATEGORY No Risk No Risk No Risk       Collaboration of Care: none  A total of 30 minutes was spent involved in face to face clinical care, chart review, documentation.   Carlyn Reichert, MD 10/20/2022, 11:16 AM

## 2022-10-20 NOTE — Addendum Note (Signed)
Addended by: Everlena Cooper on: 10/20/2022 11:37 AM   Modules accepted: Level of Service

## 2022-10-22 ENCOUNTER — Encounter: Payer: Self-pay | Admitting: Allergy

## 2022-10-22 ENCOUNTER — Ambulatory Visit: Payer: Medicare HMO | Admitting: Allergy

## 2022-10-22 VITALS — BP 118/80 | HR 88 | Temp 98.4°F | Resp 18

## 2022-10-22 DIAGNOSIS — K219 Gastro-esophageal reflux disease without esophagitis: Secondary | ICD-10-CM

## 2022-10-22 DIAGNOSIS — H1013 Acute atopic conjunctivitis, bilateral: Secondary | ICD-10-CM | POA: Diagnosis not present

## 2022-10-22 DIAGNOSIS — J3089 Other allergic rhinitis: Secondary | ICD-10-CM | POA: Diagnosis not present

## 2022-10-22 DIAGNOSIS — J4550 Severe persistent asthma, uncomplicated: Secondary | ICD-10-CM | POA: Diagnosis not present

## 2022-10-22 NOTE — Patient Instructions (Addendum)
Allergic rhinitis with conjunctivitis (grass pollen, weed pollen, molds, and mouse) Continue Singulair 10 mg once a day Continue Xyzal 5 mg once a day as needed Continue Pataday 1 drop each eye once a day as needed for itchy watery eyes Continue ipratropium bromide nasal spray using 2 sprays each nostril twice a day as needed for runny nose/drainage down throat Continue fluticasone nasal spray 2 sprays each nostril once a day for stuffy nose. May use saline nasal rinse as needed for nasal symptoms. Use this prior to any medicated nasal spray.  Asthma  Continue Trelegy 1 puff daily Perform flutter valve several times a day to help move mucus from airway once you locate it from storage.   Continue monthly Nucala injections.  Continue Singulair 10 mg as above May use albuterol 2 puffs every 4 hours as needed for cough, wheeze, tightness in chest, or shortness of breath. Also, may use albuterol 2 puffs 5-15 minutes prior to exercise If increased asthma symptoms or flare take 2 tab daily for 5 days.  Provided with prednisone pack.    Chronic rhinosinusitis  Strep pneumonia titers are better but still does not have great protection.  Now has 35% protective rate up from 22% previously.   Let see how you do with infections and if you still struggle with viral or bacterial illnesses then will have to discuss immunoglobulin replacement therapy  Reflux Use pantoprazole daily for reflux control Continue dietary and lifestyle modifications as below  Follow-up in 6 months or sooner if needed

## 2022-10-22 NOTE — Progress Notes (Unsigned)
Follow-up Note  RE: [name] MRN: [id number] DOB: Jan 20, 1966 Date of Office Visit: 10/22/2022   History of present illness: [name] is a 57 y.o. female presenting today for follow-up of allergic rhinitis with conjunctivitis, asthma, reflux.  She also has history of recurrent sinus infections.  She was last seen in the office on 06/19/2022 myself.  She states she overall has been doing pretty good since the last visit.  She however did have a injury and states she hurt her ribs on the right side about 2 to 3 weeks ago when she was picking up a package in her car and her shoe slipped and she fell forward into the car onto the rib area.  She already uses pain medications and did not want to increase her current pain medication regimen for this.  Because of this she does feel an increase of pain in the area if she does cough.  She states she has had a little bit more of a sore throat and some congestion.  She does continue on Singulair, Xyzal daily.  She does use her nasal sprays fluticasone and ipratropium and they do help with nasal congestion and drainage control.  She states with her asthma she continues with Trelegy 1 puff daily and Nucala every 4 weeks.  She feels like this is the clear she has felt her lungs have been a while.  She is in the process of moving in between places and states her flutter valve is packed up somewhere.  She states however she is able to cough and hack up any mucus currently.  She states albuterol use at this time is about 1-2 times a month.  She states last month she used it about twice.  She does believe she has had a another prednisone course since the last visit for a respiratory illness however does not believe she has had any antibiotics.  She does continue on pantoprazole for reflux control.  Review of systems: 10pt ROS negative unless noted above in HPI  Past medical/social/surgical/family history have been reviewed and are unchanged unless  specifically indicated below.  No changes  Medication List: Current Outpatient Medications  Medication Sig Dispense Refill   albuterol (VENTOLIN HFA) 108 (90 Base) MCG/ACT inhaler INHALE 2 PUFFS BY MOUTH EVERY 4 TO 6 HOURS AS NEEDED 18 each 1   aspirin 81 MG tablet Take 81 mg by mouth daily.     Azelastine HCl 137 MCG/SPRAY SOLN *NEED OFFICE VISIT* PLACE 2 SPRAYS INTO BOTH NOSTRILS 2 (TWO) TIMES DAILY 30 mL 3   buPROPion (WELLBUTRIN XL) 300 MG 24 hr tablet Take 1 tablet (300 mg total) by mouth daily. 30 tablet 3   Calcium Carb-Cholecalciferol (OYSTER SHELL CALCIUM W/D) 500-5 MG-MCG TABS TAKE 1 TABLET BY MOUTH THREE TIMES A DAY 90 tablet 6   calcium-vitamin D (OSCAL WITH D) 500-200 MG-UNIT tablet Take 1 tablet by mouth 3 (three) times daily. 90 tablet 6   cyclobenzaprine (FLEXERIL) 10 MG tablet Take 10 mg by mouth 2 (two) times daily as needed.     fluticasone (FLONASE) 50 MCG/ACT nasal spray SPRAY 2 SPRAYS INTO EACH NOSTRIL EVERY DAY 16 mL 2   Fluticasone-Umeclidin-Vilant (TRELEGY ELLIPTA) 200-62.5-25 MCG/ACT AEPB Inhale 1 puff into the lungs daily. 60 each 5   formoterol (PERFOROMIST) 20 MCG/2ML nebulizer solution SMARTSIG:Via Inhaler     gabapentin (NEURONTIN) 300 MG capsule Take 1 capsule by mouth 3 (three) times daily.     HYDROcodone bit-homatropine (HYCODAN)  5-1.5 MG/5ML syrup Take 5 mLs by mouth every 6 (six) hours as needed for cough. 120 mL 0   ipratropium (ATROVENT) 0.03 % nasal spray 2 sprays each nostril twice a day as needed for runny nose/drainage down throat. 30 mL 5   levocetirizine (XYZAL) 5 MG tablet TAKE 1 TABLET EVERY DAY AS NEEDED 30 tablet 2   mepolizumab (NUCALA) 100 MG/ML SOSY INJECT 100MG  SUBCUTANEOUSLY EVERY 4 WEEKS 1 mL 11   montelukast (SINGULAIR) 10 MG tablet TAKE 1 TABLET BY MOUTH EVERYDAY AT BEDTIME 30 tablet 5   morphine (MSIR) 15 MG tablet Take 15 mg by mouth every 4 (four) hours as needed for severe pain.     pantoprazole (PROTONIX) 40 MG tablet Take 1 tablet  (40 mg total) by mouth daily. 30 tablet 5   simvastatin (ZOCOR) 20 MG tablet TAKE 1 TABLET BY MOUTH EVERY DAY AT 6PM 90 tablet 0   triamcinolone (NASACORT) 55 MCG/ACT AERO nasal inhaler      zonisamide (ZONEGRAN) 100 MG capsule Take 1 capsule (100 mg total) by mouth at bedtime. 30 capsule 3   promethazine (PHENERGAN) 25 MG tablet Take 1 tablet (25 mg total) by mouth every 8 (eight) hours as needed for nausea or vomiting. (Patient not taking: Reported on 10/22/2022) 20 tablet 0   Current Facility-Administered Medications  Medication Dose Route Frequency Provider Last Rate Last Admin   mepolizumab (NUCALA) injection 100 mg  100 mg Subcutaneous Q28 days Marcelyn Bruins, MD   100 mg at 09/30/22 8413     Known medication allergies: Allergies  Allergen Reactions   Other    Effexor [Venlafaxine] Other (See Comments)    UNSPECIFIED REACTION, headaches, felt funny, withdrawal with missed dose    Lamotrigine Rash and Other (See Comments)     Physical examination: Blood pressure 118/80, pulse 88, temperature 98.4 F (36.9 C), temperature source Temporal, resp. rate 18, SpO2 98%.  General: Alert, interactive, in no acute distress. HEENT: PERRLA, TMs pearly gray, turbinates moderately edematous without discharge, post-pharynx non erythematous. Neck: Supple without lymphadenopathy. Lungs: Clear to auscultation without wheezing, rhonchi or rales. {no increased work of breathing. CV: Normal S1, S2 without murmurs. Abdomen: Nondistended, nontender. Skin: Warm and dry, without lesions or rashes. Extremities:  No clubbing, cyanosis or edema. Neuro:   Grossly intact.  Diagnositics/Labs: None today  Assessment and plan:   Allergic rhinitis with conjunctivitis (grass pollen, weed pollen, molds, and mouse) Continue Singulair 10 mg once a day Continue Xyzal 5 mg once a day as needed Continue Pataday 1 drop each eye once a day as needed for itchy watery eyes Continue ipratropium bromide  nasal spray using 2 sprays each nostril twice a day as needed for runny nose/drainage down throat Continue fluticasone nasal spray 2 sprays each nostril once a day for stuffy nose. May use saline nasal rinse as needed for nasal symptoms. Use this prior to any medicated nasal spray.  Asthma  Continue Trelegy 1 puff daily Perform flutter valve several times a day to help move mucus from airway once you locate it from storage.   Continue monthly Nucala injections.  Continue Singulair 10 mg as above May use albuterol 2 puffs every 4 hours as needed for cough, wheeze, tightness in chest, or shortness of breath. Also, may use albuterol 2 puffs 5-15 minutes prior to exercise If increased asthma symptoms or flare take 2 tab daily for 5 days.  Provided with prednisone pack.    Chronic rhinosinusitis  Strep pneumonia titers  are better but still does not have great protection.  Now has 35% protective rate up from 22% previously.   Let see how you do with infections and if you still struggle with viral or bacterial illnesses then will have to discuss immunoglobulin replacement therapy  Reflux Use pantoprazole daily for reflux control Continue dietary and lifestyle modifications as below  Follow-up in 6 months or sooner if needed  I appreciate the opportunity to take part in Daniah's care. Please do not hesitate to contact me with questions.  Sincerely,   Margo Aye, MD Allergy/Immunology [facility]

## 2022-10-23 MED ORDER — LEVOCETIRIZINE DIHYDROCHLORIDE 5 MG PO TABS: ORAL_TABLET | ORAL | 5 refills | Status: DC

## 2022-10-23 MED ORDER — FLUTICASONE PROPIONATE 50 MCG/ACT NA SUSP: 2.0000 | Freq: Every day | NASAL | 5 refills | Status: DC | PRN

## 2022-10-23 MED ORDER — IPRATROPIUM BROMIDE 0.03 % NA SOLN: NASAL | 5 refills | Status: DC

## 2022-10-23 MED ORDER — MONTELUKAST SODIUM 10 MG PO TABS: ORAL_TABLET | ORAL | 5 refills | Status: DC

## 2022-10-23 MED ORDER — TRELEGY ELLIPTA 200-62.5-25 MCG/ACT IN AEPB: 1.0000 | INHALATION_SPRAY | Freq: Every day | RESPIRATORY_TRACT | 5 refills | Status: DC

## 2022-10-23 MED ORDER — PANTOPRAZOLE SODIUM 40 MG PO TBEC: 40.0000 mg | DELAYED_RELEASE_TABLET | Freq: Every day | ORAL | 5 refills | Status: DC

## 2022-10-26 ENCOUNTER — Other Ambulatory Visit: Payer: Self-pay | Admitting: Family Medicine

## 2022-10-27 ENCOUNTER — Other Ambulatory Visit: Payer: Self-pay

## 2022-10-27 DIAGNOSIS — Z13220 Encounter for screening for lipoid disorders: Secondary | ICD-10-CM

## 2022-10-27 DIAGNOSIS — E785 Hyperlipidemia, unspecified: Secondary | ICD-10-CM

## 2022-10-27 MED ORDER — SIMVASTATIN 20 MG PO TABS
ORAL_TABLET | ORAL | 0 refills | Status: DC
Start: 2022-10-27 — End: 2022-12-17

## 2022-10-28 ENCOUNTER — Ambulatory Visit (INDEPENDENT_AMBULATORY_CARE_PROVIDER_SITE_OTHER): Payer: Medicare HMO | Admitting: *Deleted

## 2022-10-28 DIAGNOSIS — J4550 Severe persistent asthma, uncomplicated: Secondary | ICD-10-CM | POA: Diagnosis not present

## 2022-10-31 ENCOUNTER — Ambulatory Visit (INDEPENDENT_AMBULATORY_CARE_PROVIDER_SITE_OTHER): Payer: Medicare HMO

## 2022-10-31 ENCOUNTER — Telehealth: Payer: Self-pay | Admitting: Family Medicine

## 2022-10-31 DIAGNOSIS — Z23 Encounter for immunization: Secondary | ICD-10-CM | POA: Diagnosis not present

## 2022-10-31 NOTE — Telephone Encounter (Signed)
Referral Request - Has patient seen PCP for this complaint? yes *If NO, is insurance requiring patient see PCP for this issue before PCP can refer them? Referral for which specialty: dermatologist Preferred provider/office: ? Reason for referral:  spots on left leg

## 2022-11-03 ENCOUNTER — Ambulatory Visit: Payer: Medicare HMO

## 2022-11-03 ENCOUNTER — Ambulatory Visit
Admission: EM | Admit: 2022-11-03 | Discharge: 2022-11-03 | Disposition: A | Discharge status: Home / Self Care | Payer: Medicare HMO | Attending: Internal Medicine | Admitting: Internal Medicine

## 2022-11-03 DIAGNOSIS — M5459 Other low back pain: Secondary | ICD-10-CM

## 2022-11-03 DIAGNOSIS — M25512 Pain in left shoulder: Secondary | ICD-10-CM

## 2022-11-03 DIAGNOSIS — S2232XA Fracture of one rib, left side, initial encounter for closed fracture: Secondary | ICD-10-CM | POA: Diagnosis not present

## 2022-11-03 DIAGNOSIS — M549 Dorsalgia, unspecified: Secondary | ICD-10-CM | POA: Diagnosis not present

## 2022-11-03 DIAGNOSIS — W19XXXA Unspecified fall, initial encounter: Secondary | ICD-10-CM | POA: Diagnosis not present

## 2022-11-03 DIAGNOSIS — M5126 Other intervertebral disc displacement, lumbar region: Secondary | ICD-10-CM | POA: Diagnosis not present

## 2022-11-03 DIAGNOSIS — Z4789 Encounter for other orthopedic aftercare: Secondary | ICD-10-CM | POA: Diagnosis not present

## 2022-11-03 DIAGNOSIS — R29890 Loss of height: Secondary | ICD-10-CM | POA: Diagnosis not present

## 2022-11-03 DIAGNOSIS — Z981 Arthrodesis status: Secondary | ICD-10-CM | POA: Diagnosis not present

## 2022-11-03 DIAGNOSIS — M47812 Spondylosis without myelopathy or radiculopathy, cervical region: Secondary | ICD-10-CM | POA: Diagnosis not present

## 2022-11-03 DIAGNOSIS — M19012 Primary osteoarthritis, left shoulder: Secondary | ICD-10-CM | POA: Diagnosis not present

## 2022-11-03 DIAGNOSIS — M4312 Spondylolisthesis, cervical region: Secondary | ICD-10-CM | POA: Diagnosis not present

## 2022-11-03 MED ORDER — KETOROLAC TROMETHAMINE 30 MG/ML IJ SOLN
30.0000 mg | Freq: Once | INTRAMUSCULAR | Status: AC
  Administered 2022-11-03: 30 mg via INTRAMUSCULAR

## 2022-11-03 NOTE — Discharge Instructions (Addendum)
X-rays are negative for any acute fracture.  Suspect bruising and muscle inflammation related to mechanism of fall.  Follow-up with your spine specialist as soon as possible to evaluate compression deformity as we discussed. Apply ice to affected areas.

## 2022-11-03 NOTE — ED Triage Notes (Signed)
Pt states she was picking up a dog yesterday and herd a pop in her back.  Pt c/o mid back pain and left shoulder pain. States she took morphine and a muscle relaxer at home for the pain.

## 2022-11-03 NOTE — ED Provider Notes (Addendum)
[facility]    CSN: [id number] Arrival date & time: 11/03/22  1413      History   Chief Complaint Chief Complaint  Patient presents with   Back Pain    HPI Nashely Beath is a 57 y.o. female.   Patient presents for further evaluation after a fall that occurred yesterday.  Patient reports that she was trying to pick up her dog to put in a car when she fell and twisted wrong.  Reports that she heard a pop in her lower back.  She also states that she hurt her left shoulder trying to catch herself.  Denies numbness or tingling.  Patient does have chronic neck and back pain from recent surgeries.  She has taken her already prescribed morphine and muscle relaxer with minimal improvement in pain.  Denies urinary frequency, urinary or bowel continence, saddle anesthesia.   Back Pain   Past Medical History:  Diagnosis Date   Anxiety    Arthritis    neck, knees, shoulders   Asthma    Bipolar disorder (HCC)    currently feeling MANIC- 10/02/2016   Depression    GERD (gastroesophageal reflux disease)    Hip fracture (HCC) 06/2019   History of blood transfusion    as a newborn    History of lump of left breast    Hyperlipidemia    Lumbar pseudoarthrosis    Motion sickness    cars   OSA (obstructive sleep apnea) 01/09/2016   can't afford CPAP   Personality disorder (HCC)    PONV (postoperative nausea and vomiting)    Post traumatic stress disorder (PTSD)    Substance abuse (HCC)    Synovial cyst     Patient Active Problem List   Diagnosis Date Noted   Avascular necrosis of bone (HCC) 07/01/2022   Cervical radiculopathy 07/01/2022   Closed fracture of acetabulum (HCC) 07/01/2022   Compression fracture of L1 lumbar vertebra (HCC) 07/01/2022   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   Hiatal hernia 04/21/2020   Other chest pain 04/07/2020   Acute non intractable tension-type headache 04/07/2020   Medial epicondylitis of left elbow 07/19/2019   Pain in  left hip 07/19/2019   Asthma exacerbation 11/22/2018   GERD (gastroesophageal reflux disease) 06/22/2018   Cannabis use disorder, moderate, dependence (HCC) 04/22/2018   Insomnia 04/15/2018   Non-intractable vomiting    Elevated blood pressure reading without diagnosis of hypertension 06/13/2017   GAD (generalized anxiety disorder) 05/23/2017   Synovial cyst of lumbar spine 01/16/2016   OSA (obstructive sleep apnea) 01/09/2016   Asthma 11/21/2015   Chronic pain syndrome 11/21/2015   HLD (hyperlipidemia) 09/12/2014   Bipolar 1 disorder, depressed, partial remission (HCC) 09/12/2014    Past Surgical History:  Procedure Laterality Date   COLONOSCOPY WITH PROPOFOL N/A 11/04/2017   Procedure: COLONOSCOPY WITH PROPOFOL;  Surgeon: Toney Reil, MD;  Location: [facility];  Service: Endoscopy;  Laterality: N/A;   ESOPHAGOGASTRODUODENOSCOPY (EGD) WITH PROPOFOL N/A 11/04/2017   Procedure: ESOPHAGOGASTRODUODENOSCOPY (EGD) WITH PROPOFOL with biopsies;  Surgeon: Toney Reil, MD;  Location: [facility];  Service: Endoscopy;  Laterality: N/A;  sleep apnea   KNEE SURGERY Left    x5, post basketball injury   LUMBAR LAMINECTOMY/DECOMPRESSION MICRODISCECTOMY Left 01/22/2016   Procedure: Laminectomy for facet/synovial cyst - left - Lumbar four - lumbar five;  Surgeon: Julio Sicks, MD;  Location: [facility] OR;  Service: Neurosurgery;  Laterality: Left;  Laminectomy for facet/synovial cyst - left -  Lumbar four - lumbar five   POLYPECTOMY N/A 11/04/2017   Procedure: POLYPECTOMY INTESTINAL;  Surgeon: Toney Reil, MD;  Location: [facility];  Service: Endoscopy;  Laterality: N/A;   SHOULDER SURGERY Left    x2   SPINE SURGERY N/A    [employer] 08/07/2019   TOE SURGERY Bilateral    bone spurs    OB History   No obstetric history on file.      Home Medications    Prior to Admission medications   Medication Sig Start Date End Date Taking? Authorizing Provider   albuterol (VENTOLIN HFA) 108 (90 Base) MCG/ACT inhaler INHALE 2 PUFFS BY MOUTH EVERY 4 TO 6 HOURS AS NEEDED 06/10/22   Marcelyn Bruins, MD  aspirin 81 MG tablet Take 81 mg by mouth daily.    [provider]  buPROPion (WELLBUTRIN XL) 300 MG 24 hr tablet Take 1 tablet (300 mg total) by mouth daily. 10/20/22 04/18/23  Carlyn Reichert, MD  Calcium Carb-Cholecalciferol (OYSTER SHELL CALCIUM W/D) 500-5 MG-MCG TABS TAKE 1 TABLET BY MOUTH THREE TIMES A DAY 01/29/21   Cristie Hem, PA-C  calcium-vitamin D (OSCAL WITH D) 500-200 MG-UNIT tablet Take 1 tablet by mouth 3 (three) times daily. 07/19/19   Tarry Kos, MD  cyclobenzaprine (FLEXERIL) 10 MG tablet Take 10 mg by mouth 2 (two) times daily as needed. 02/06/22   [provider]  fluticasone (FLONASE) 50 MCG/ACT nasal spray Place 2 sprays into both nostrils daily as needed for allergies or rhinitis. 10/23/22   Marcelyn Bruins, MD  Fluticasone-Umeclidin-Vilant (TRELEGY ELLIPTA) 200-62.5-25 MCG/ACT AEPB Inhale 1 puff into the lungs daily. 10/23/22   Marcelyn Bruins, MD  gabapentin (NEURONTIN) 300 MG capsule Take 1 capsule by mouth 3 (three) times daily. 02/25/17   [provider]  HYDROcodone bit-homatropine (HYCODAN) 5-1.5 MG/5ML syrup Take 5 mLs by mouth every 6 (six) hours as needed for cough. 08/12/22   Rema Fendt, NP  ipratropium (ATROVENT) 0.03 % nasal spray 2 sprays each nostril twice a day as needed for runny nose/drainage down throat. 10/23/22   Marcelyn Bruins, MD  levocetirizine (XYZAL) 5 MG tablet TAKE 1 TABLET EVERY DAY AS NEEDED 10/23/22   Marcelyn Bruins, MD  mepolizumab (NUCALA) 100 MG/ML SOSY INJECT 100MG  SUBCUTANEOUSLY EVERY 4 WEEKS 04/24/22   Marcelyn Bruins, MD  montelukast (SINGULAIR) 10 MG tablet TAKE 1 TABLET BY MOUTH EVERYDAY AT BEDTIME 10/23/22   Marcelyn Bruins, MD  morphine (MSIR) 15 MG tablet Take 15 mg by mouth every 4 (four) hours as  needed for severe pain.    [provider]  pantoprazole (PROTONIX) 40 MG tablet Take 1 tablet (40 mg total) by mouth daily. 10/23/22   Marcelyn Bruins, MD  promethazine (PHENERGAN) 25 MG tablet Take 1 tablet (25 mg total) by mouth every 8 (eight) hours as needed for nausea or vomiting. Patient not taking: Reported on 10/22/2022 02/03/22   Georganna Skeans, MD  simvastatin (ZOCOR) 20 MG tablet TAKE 1 TABLET BY MOUTH EVERY DAY AT [employer] 10/27/22   Georganna Skeans, MD  simvastatin (ZOCOR) 20 MG tablet TAKE 1 TABLET BY MOUTH EVERY DAY AT [employer] 10/27/22   Georganna Skeans, MD  triamcinolone (NASACORT) 55 MCG/ACT AERO nasal inhaler  12/01/19   [provider]  zonisamide (ZONEGRAN) 100 MG capsule Take 1 capsule (100 mg total) by mouth at bedtime. 10/20/22 04/18/23  Carlyn Reichert, MD    Family History Family History  Problem Relation  Age of Onset   Heart disease Father    Hyperlipidemia Father    Alcohol abuse Brother    Alcohol abuse Paternal Uncle    Breast cancer Maternal Aunt        82's   Colon cancer Neg Hx     Social History Social History   Tobacco Use   Smoking status: Former    Current packs/day: 0.00    Average packs/day: 1 pack/day for 1 year (1.0 ttl pk-yrs)    Types: Cigarettes    Start date: 04/16/1995    Quit date: 04/15/1996    Years since quitting: 26.5    Passive exposure: Past   Smokeless tobacco: Never   Tobacco comments:    started back again in 2016, smoked for about 6 mo and then quit  Vaping Use   Vaping status: Never Used  Substance Use Topics   Alcohol use: No    Alcohol/week: 0.0 standard drinks of alcohol    Comment: quit 20 years   Drug use: Yes    Frequency: 14.0 times per week    Types: Marijuana    Comment: 20 yrs. ago- cocaine      Allergies   Other, Effexor [venlafaxine], and Lamotrigine   Review of Systems Review of Systems Per HPI  Physical Exam Triage Vital Signs ED Triage Vitals  Encounter Vitals Group     BP  11/03/22 1517 126/84     Systolic BP Percentile --      Diastolic BP Percentile --      Pulse Rate 11/03/22 1517 78     Resp 11/03/22 1517 16     Temp 11/03/22 1517 97.8 F (36.6 C)     Temp Source 11/03/22 1517 Oral     SpO2 11/03/22 1517 96 %     Weight --      Height --      Head Circumference --      Peak Flow --      Pain Score 11/03/22 1518 8     Pain Loc --      Pain Education --      Exclude from Growth Chart --    No data found.  Updated Vital Signs BP 126/84 (BP Location: Right Arm)   Pulse 78   Temp 97.8 F (36.6 C) (Oral)   Resp 16   SpO2 96%   Visual Acuity Right Eye Distance:   Left Eye Distance:   Bilateral Distance:    Right Eye Near:   Left Eye Near:    Bilateral Near:     Physical Exam Constitutional:      General: She is not in acute distress.    Appearance: Normal appearance. She is not toxic-appearing or diaphoretic.  HENT:     Head: Normocephalic and atraumatic.  Eyes:     Extraocular Movements: Extraocular movements intact.     Conjunctiva/sclera: Conjunctivae normal.  Pulmonary:     Effort: Pulmonary effort is normal.  Musculoskeletal:       Back:     Comments: Patient has tenderness to palpation from the lower thoracic back throughout lumbar region.  No crepitus or step-off noted.  Also has tenderness to palpation to left upper thoracic/shoulder area.  This pain seems to extend into the lateral left neck.  No direct spinal tenderness, crepitus, step off noted to cervical region.  Patient has full range of motion of left upper extremity with grip strength being 5/5.  Appears to be neurovascularly intact.  Neurological:  General: No focal deficit present.     Mental Status: She is alert and oriented to person, place, and time. Mental status is at baseline.  Psychiatric:        Mood and Affect: Mood normal.        Behavior: Behavior normal.        Thought Content: Thought content normal.        Judgment: Judgment normal.      UC  Treatments / Results  Labs (all labs ordered are listed, but only abnormal results are displayed) Labs Reviewed - No data to display  EKG   Radiology DG Thoracic Spine 2 View  Result Date: 11/03/2022 CLINICAL DATA:  Fall with diffuse back pain. EXAM: THORACIC SPINE 2 VIEWS COMPARISON:  Lumbar radiograph 06/27/2020, lumbar CT 11/27/2021. Prior chest imaging reviewed. FINDINGS: Mild T12 superior endplate compression deformity is new from March 2024 chest radiograph. There is approximately 10% loss of height anteriorly. No involvement of posterior cortex. No additional thoracic spine fracture. Posterior elements are poorly assessed on the current exam, grossly intact. There is no paravertebral soft tissue abnormality. IMPRESSION: Mild T12 superior endplate compression deformity with 10% loss of height, new from March 2024. Electronically Signed   By: Narda Rutherford M.D.   On: 11/03/2022 16:41   DG Cervical Spine 2-3 Views  Result Date: 11/03/2022 CLINICAL DATA:  Fall today with diffuse back pain. EXAM: CERVICAL SPINE - 2-3 VIEW COMPARISON:  Most recent cervical spine imaging radiograph 01/30/2022 FINDINGS: No evidence of acute fracture. Trace chronic anterolisthesis of C2 on C3 and C3 on C4. Moderate C4-C5 through C6-C7 degenerative disc disease. Moderate multilevel facet hypertrophy. There is no prevertebral soft tissue thickening. The dens is not well assessed in the absence of an odontoid view. IMPRESSION: 1. No radiographic evidence of acute fracture of the cervical spine. 2. Moderate multilevel degenerative change. Trace chronic anterolisthesis of C2 on C3 and C3 on C4. Electronically Signed   By: Narda Rutherford M.D.   On: 11/03/2022 16:39   DG Lumbar Spine Complete  Result Date: 11/03/2022 CLINICAL DATA:  Fall today with diffuse back pain. EXAM: LUMBAR SPINE - COMPLETE 4+ VIEW COMPARISON:  Most recent lumbar spine imaging CT 11/27/2021 FINDINGS: Five non rib-bearing lumbar vertebra. Posterior  rod with intrapedicular screw fusion L4 through S1 with interbody spacers. Intact hardware. Trace retrolisthesis of L1 on L2, L2 on L3, and L3 on L4, chronic. L1 compression deformity is chronic and unchanged. No evidence of acute fracture. Anterior spurring throughout the upper lumbar spine, stable. Sacroiliac joints are congruent. IMPRESSION: 1. No acute fracture of the lumbar spine. 2. Chronic L1 compression deformity. 3. Posterior fusion L4 through S1.  Hardware is intact. Electronically Signed   By: Narda Rutherford M.D.   On: 11/03/2022 16:38   DG Shoulder Left  Result Date: 11/03/2022 CLINICAL DATA:  fall Left shoulder pain.  Diffuse back pain. EXAM: LEFT SHOULDER - 2+ VIEW COMPARISON:  None Available. FINDINGS: There is no evidence of fracture or dislocation. Mild acromioclavicular and glenohumeral degenerative change. No erosion or focal bone abnormality. No acute fracture of included ribs. There is a remote healed mid left rib fracture. Soft tissues are unremarkable. IMPRESSION: No fracture or dislocation. Mild acromioclavicular and glenohumeral degenerative change. Electronically Signed   By: Narda Rutherford M.D.   On: 11/03/2022 16:35    Procedures Procedures (including critical care time)  Medications Ordered in UC Medications  ketorolac (TORADOL) 30 MG/ML injection 30 mg (has no administration in  time range)    Initial Impression / Assessment and Plan / UC Course  I have reviewed the triage vital signs and the nursing notes.  Pertinent labs & imaging results that were available during my care of the patient were reviewed by me and considered in my medical decision making (see chart for details).     X-rays are negative for any obvious acute bony abnormality.  Patient does have new compression deformity on thoracic x-ray but low suspicion this is related to trauma.  Colleague MD evaluated as well and was agreeable with this as well.  Advised patient to follow-up with her spine  specialist as soon as possible to have them evaluate this to see if any additional imaging is necessary.  Patient reports that she has a back brace that she has been wearing which I do think is reasonable.  Advised ice application.  IM Toradol administered in urgent care. Advised to avoid her aspirin 81 mg and NSAID's for at least 24 hours given toradol injection today.  Advised for her to take her established morphine and Flexeril as needed for pain control.  Patient verbalized understanding and was agreeable with plan. Final Clinical Impressions(s) / UC Diagnoses   Final diagnoses:  Fall, initial encounter  Back pain, unspecified back location, unspecified back pain laterality, unspecified chronicity  Acute pain of left shoulder     Discharge Instructions      X-rays are negative for any acute fracture.  Suspect bruising and muscle inflammation related to mechanism of fall.  Follow-up with your spine specialist as soon as possible to evaluate compression deformity as we discussed. Apply ice to affected areas.     ED Prescriptions   None    I have reviewed the PDMP during this encounter.   Gustavus Bryant, [place] 11/03/22 1706    Gustavus Bryant, [place] 11/03/22 [phone]

## 2022-11-06 ENCOUNTER — Ambulatory Visit: Payer: Medicare HMO

## 2022-11-19 ENCOUNTER — Other Ambulatory Visit: Payer: Self-pay | Admitting: Family Medicine

## 2022-11-19 DIAGNOSIS — L989 Disorder of the skin and subcutaneous tissue, unspecified: Secondary | ICD-10-CM

## 2022-11-25 ENCOUNTER — Ambulatory Visit (INDEPENDENT_AMBULATORY_CARE_PROVIDER_SITE_OTHER): Payer: Medicare HMO

## 2022-11-25 DIAGNOSIS — J4550 Severe persistent asthma, uncomplicated: Secondary | ICD-10-CM | POA: Diagnosis not present

## 2022-11-26 DIAGNOSIS — M48061 Spinal stenosis, lumbar region without neurogenic claudication: Secondary | ICD-10-CM | POA: Diagnosis not present

## 2022-11-26 DIAGNOSIS — M5416 Radiculopathy, lumbar region: Secondary | ICD-10-CM | POA: Diagnosis not present

## 2022-11-26 DIAGNOSIS — M4856XA Collapsed vertebra, not elsewhere classified, lumbar region, initial encounter for fracture: Secondary | ICD-10-CM | POA: Diagnosis not present

## 2022-11-26 DIAGNOSIS — R200 Anesthesia of skin: Secondary | ICD-10-CM | POA: Diagnosis not present

## 2022-11-26 DIAGNOSIS — M5126 Other intervertebral disc displacement, lumbar region: Secondary | ICD-10-CM | POA: Diagnosis not present

## 2022-12-17 ENCOUNTER — Other Ambulatory Visit: Payer: Self-pay | Admitting: Pharmacist

## 2022-12-17 ENCOUNTER — Ambulatory Visit (HOSPITAL_COMMUNITY): Payer: Medicare HMO | Admitting: Student

## 2022-12-17 MED ORDER — SIMVASTATIN 20 MG PO TABS: ORAL_TABLET | ORAL | 0 refills | Status: DC

## 2022-12-17 NOTE — Progress Notes (Signed)
Pharmacy Quality Measure Review  This patient is appearing on a report for being at risk of failing the adherence measure for cholesterol (statin) medications this calendar year.   Medication: simvastatin Last fill date: 10/27/22 for 90 day supply  Insurance report was not up to date. No action needed at this time.   Butch Penny, PharmD, Patsy [place], CPP Clinical Pharmacist [facility] [phone]

## 2022-12-23 ENCOUNTER — Ambulatory Visit: Payer: Medicare HMO | Admitting: *Deleted

## 2022-12-23 DIAGNOSIS — J4550 Severe persistent asthma, uncomplicated: Secondary | ICD-10-CM

## 2022-12-24 ENCOUNTER — Ambulatory Visit (HOSPITAL_BASED_OUTPATIENT_CLINIC_OR_DEPARTMENT_OTHER): Payer: Medicare HMO | Admitting: Student

## 2022-12-24 DIAGNOSIS — F3175 Bipolar disorder, in partial remission, most recent episode depressed: Secondary | ICD-10-CM | POA: Diagnosis not present

## 2022-12-24 DIAGNOSIS — F1220 Cannabis dependence, uncomplicated: Secondary | ICD-10-CM | POA: Diagnosis not present

## 2022-12-24 DIAGNOSIS — F1021 Alcohol dependence, in remission: Secondary | ICD-10-CM | POA: Diagnosis not present

## 2022-12-24 MED ORDER — ZONISAMIDE 100 MG PO CAPS
100.0000 mg | ORAL_CAPSULE | Freq: Every day | ORAL | 3 refills | Status: DC
Start: 2022-12-24 — End: 2023-04-06

## 2022-12-24 MED ORDER — BUPROPION HCL ER (XL) 300 MG PO TB24
300.0000 mg | ORAL_TABLET | Freq: Every day | ORAL | 3 refills | Status: DC
Start: 2022-12-24 — End: 2023-04-06

## 2022-12-26 DIAGNOSIS — F3175 Bipolar disorder, in partial remission, most recent episode depressed: Secondary | ICD-10-CM | POA: Insufficient documentation

## 2022-12-26 DIAGNOSIS — F1021 Alcohol dependence, in remission: Secondary | ICD-10-CM | POA: Insufficient documentation

## 2022-12-26 NOTE — Progress Notes (Signed)
BH MD Outpatient Progress Note  12/26/2022 7:36 AM [name]  MRN:  [id number]  Assessment:  [name] presents for follow-up evaluation. Today, patient reports significant chronic pain exacerbation, but otherwise unchanged psychiatric symptomatology.  Mood cycling may be diminished compared to the last visit, based on patient's report that she has not had any hypomanic episodes since we saw her in August.  No medication changes for today.  Identifying Information: [name] is a 57 y.o. y.o. 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   Plan:  # Bipolar affective disorder in partial remission, currently depressed Interventions: -- Continue Wellbutrin 300 mg XL - Continue zonisamide 100 mg nightly - Ideally the patient would be on a better mood stabilizing medication, however, she has been resistant to this and appears stable - The patient is taking MS Contin 15 mg twice daily as well as gabapentin 300 mg twice daily and Flexeril 10 mg twice daily, prescribed by another physician  # Cannabis use disorder, moderate, dependence Interventions: -- Continue to encourage reduction use or abstinence   Patient was given contact information for behavioral health clinic and was instructed to call 911 for emergencies.   Subjective:  Chief Complaint:  Chief Complaint  Patient presents with   Follow-up    Interval History:  Today the patient reports that her chronic pain in her back has gotten worse.  She appears quite frustrated by this.  She reports some anxiety related to worries about the unknown.  She denies experiencing any panic attacks.  She states that she has a good neighborhood community, with people  that she spends time with frequently throughout the day.  Her affect brightens at appropriate points throughout the interview.  She reports that she has good energy throughout the day, despite having a limited amount of sleep at nighttime, typically about 4 hours, per her estimation.  This is a chronic issue.  The patient reports occasional feelings of hopelessness but denies experiencing suicidal thoughts.  She reports an unchanged level of cannabis use, using the substance approximately 2 times per day.  As stated above, she denies experiencing any hypomanic episodes.  Visit Diagnosis:    ICD-10-CM   1. Bipolar disorder, in partial remission, most recent episode depressed (HCC)  F31.75 zonisamide (ZONEGRAN) 100 MG capsule    buPROPion (WELLBUTRIN XL) 300 MG 24 hr tablet      Past Psychiatric History: Last behavioral health hospitalization in 2006, which appears to have been voluntary in nature.  The patient reports that she has not attempted suicide in several decades  Past Medical History:  Past Medical History:  Diagnosis Date   Anxiety    Arthritis    neck, knees, shoulders   Asthma    Bipolar disorder (HCC)    currently feeling MANIC- 10/02/2016   Depression    GERD (gastroesophageal reflux disease)    Hip fracture (HCC) 06/2019   History of blood transfusion    as a newborn    History of lump of left breast    Hyperlipidemia    Lumbar pseudoarthrosis    Motion sickness    cars   OSA (obstructive sleep apnea) 01/09/2016   can't afford CPAP   Personality disorder (HCC)  PONV (postoperative nausea and vomiting)    Post traumatic stress disorder (PTSD)    Substance abuse (HCC)    Synovial cyst     Past Surgical History:  Procedure Laterality Date   COLONOSCOPY WITH PROPOFOL N/A 11/04/2017   Procedure: COLONOSCOPY WITH PROPOFOL;  Surgeon: Toney Reil, MD;  Location: [facility];  Service: Endoscopy;  Laterality: N/A;   ESOPHAGOGASTRODUODENOSCOPY (EGD) WITH  PROPOFOL N/A 11/04/2017   Procedure: ESOPHAGOGASTRODUODENOSCOPY (EGD) WITH PROPOFOL with biopsies;  Surgeon: Toney Reil, MD;  Location: [facility];  Service: Endoscopy;  Laterality: N/A;  sleep apnea   KNEE SURGERY Left    x5, post basketball injury   LUMBAR LAMINECTOMY/DECOMPRESSION MICRODISCECTOMY Left 01/22/2016   Procedure: Laminectomy for facet/synovial cyst - left - Lumbar four - lumbar five;  Surgeon: Julio Sicks, MD;  Location: [facility] OR;  Service: Neurosurgery;  Laterality: Left;  Laminectomy for facet/synovial cyst - left - Lumbar four - lumbar five   POLYPECTOMY N/A 11/04/2017   Procedure: POLYPECTOMY INTESTINAL;  Surgeon: Toney Reil, MD;  Location: [facility];  Service: Endoscopy;  Laterality: N/A;   SHOULDER SURGERY Left    x2   SPINE SURGERY N/A    Phreesia 08/07/2019   TOE SURGERY Bilateral    bone spurs    Family Psychiatric History: None pertinent  Family History:  Family History  Problem Relation Age of Onset   Heart disease Father    Hyperlipidemia Father    Alcohol abuse Brother    Alcohol abuse Paternal Uncle    Breast cancer Maternal Aunt        70's   Colon cancer Neg Hx     Social History:  Social History   Socioeconomic History   Marital status: Single    Spouse name: Not on file   Number of children: 0   Years of education: Not on file   Highest education level: Bachelor's degree (e.g., BA, AB, BS)  Occupational History   Not on file  Tobacco Use   Smoking status: Former    Current packs/day: 0.00    Average packs/day: 1 pack/day for 1 year (1.0 ttl pk-yrs)    Types: Cigarettes    Start date: 04/16/1995    Quit date: 04/15/1996    Years since quitting: 26.7    Passive exposure: Past   Smokeless tobacco: Never   Tobacco comments:    started back again in 2016, smoked for about 6 mo and then quit  Vaping Use   Vaping status: Never Used  Substance and Sexual Activity   Alcohol use: No    Alcohol/week: 0.0  standard drinks of alcohol    Comment: quit 20 years   Drug use: Yes    Frequency: 14.0 times per week    Types: Marijuana    Comment: 20 yrs. ago- cocaine    Sexual activity: Not Currently  Other Topics Concern   Not on file  Social History Narrative   Not on file   Social Determinants of Health   Financial Resource Strain: Medium Risk (07/01/2022)   Overall Financial Resource Strain (CARDIA)    Difficulty of Paying Living Expenses: Somewhat hard  Food Insecurity: No Food Insecurity (07/01/2022)   Hunger Vital Sign    Worried About Running Out of Food in the Last Year: Never true    Ran Out of Food in the Last Year: Never true  Transportation Needs: No Transportation Needs (07/01/2022)   PRAPARE - Transportation    Lack  of Transportation (Medical): No    Lack of Transportation (Non-Medical): No  Physical Activity: Unknown (07/01/2022)   Exercise Vital Sign    Days of Exercise per Week: Patient declined    Minutes of Exercise per Session: Not on file  Stress: Patient Declined (07/01/2022)   [employer] of Occupational Health - Occupational Stress Questionnaire    Feeling of Stress : Patient declined  Social Connections: Moderately Isolated (07/01/2022)   Social Connection and Isolation Panel [NHANES]    Frequency of Communication with Friends and Family: More than three times a week    Frequency of Social Gatherings with Friends and Family: More than three times a week    Attends Religious Services: 1 to 4 times per year    Active Member of [employer] or Organizations: No    Attends Banker Meetings: Not on file    Marital Status: Never married    Allergies:  Allergies  Allergen Reactions   Other    Effexor [Venlafaxine] Other (See Comments)    UNSPECIFIED REACTION, headaches, felt funny, withdrawal with missed dose    Lamotrigine Rash and Other (See Comments)    Current Medications: Current Outpatient Medications  Medication Sig Dispense Refill    albuterol (VENTOLIN HFA) 108 (90 Base) MCG/ACT inhaler INHALE 2 PUFFS BY MOUTH EVERY 4 TO 6 HOURS AS NEEDED 18 each 1   aspirin 81 MG tablet Take 81 mg by mouth daily.     buPROPion (WELLBUTRIN XL) 300 MG 24 hr tablet Take 1 tablet (300 mg total) by mouth daily. 30 tablet 3   Calcium Carb-Cholecalciferol (OYSTER SHELL CALCIUM W/D) 500-5 MG-MCG TABS TAKE 1 TABLET BY MOUTH THREE TIMES A DAY 90 tablet 6   calcium-vitamin D (OSCAL WITH D) 500-200 MG-UNIT tablet Take 1 tablet by mouth 3 (three) times daily. 90 tablet 6   cyclobenzaprine (FLEXERIL) 10 MG tablet Take 10 mg by mouth 2 (two) times daily as needed.     fluticasone (FLONASE) 50 MCG/ACT nasal spray Place 2 sprays into both nostrils daily as needed for allergies or rhinitis. 16 g 5   Fluticasone-Umeclidin-Vilant (TRELEGY ELLIPTA) 200-62.5-25 MCG/ACT AEPB Inhale 1 puff into the lungs daily. 60 each 5   gabapentin (NEURONTIN) 300 MG capsule Take 1 capsule by mouth 3 (three) times daily.     HYDROcodone bit-homatropine (HYCODAN) 5-1.5 MG/5ML syrup Take 5 mLs by mouth every 6 (six) hours as needed for cough. 120 mL 0   ipratropium (ATROVENT) 0.03 % nasal spray 2 sprays each nostril twice a day as needed for runny nose/drainage down throat. 30 mL 5   levocetirizine (XYZAL) 5 MG tablet TAKE 1 TABLET EVERY DAY AS NEEDED 30 tablet 5   mepolizumab (NUCALA) 100 MG/ML SOSY INJECT 100MG  SUBCUTANEOUSLY EVERY 4 WEEKS 1 mL 11   montelukast (SINGULAIR) 10 MG tablet TAKE 1 TABLET BY MOUTH EVERYDAY AT BEDTIME 30 tablet 5   morphine (MSIR) 15 MG tablet Take 15 mg by mouth every 4 (four) hours as needed for severe pain.     pantoprazole (PROTONIX) 40 MG tablet Take 1 tablet (40 mg total) by mouth daily. 30 tablet 5   promethazine (PHENERGAN) 25 MG tablet Take 1 tablet (25 mg total) by mouth every 8 (eight) hours as needed for nausea or vomiting. (Patient not taking: Reported on 10/22/2022) 20 tablet 0   simvastatin (ZOCOR) 20 MG tablet TAKE 1 TABLET BY MOUTH EVERY  DAY AT 6PM 90 tablet 0   triamcinolone (NASACORT) 55 MCG/ACT AERO nasal  inhaler      zonisamide (ZONEGRAN) 100 MG capsule Take 1 capsule (100 mg total) by mouth at bedtime. 30 capsule 3   Current Facility-Administered Medications  Medication Dose Route Frequency Provider Last Rate Last Admin   mepolizumab (NUCALA) injection 100 mg  100 mg Subcutaneous Q28 days Marcelyn Bruins, MD   100 mg at 12/23/22 1034     Objective:  Psychiatric Specialty Exam: Physical Exam Constitutional:      Appearance: the patient is not toxic-appearing.  Pulmonary:     Effort: Pulmonary effort is normal.  Neurological:     General: No focal deficit present.     Mental Status: the patient is alert and oriented to person, place, and time.   Review of Systems  Respiratory:  Negative for shortness of breath.   Cardiovascular:  Negative for chest pain.  Gastrointestinal:  Negative for abdominal pain, constipation, diarrhea, nausea and vomiting.  Neurological:  Negative for headaches.      There were no vitals taken for this visit.  General Appearance: Fairly Groomed  Eye Contact:  Good  Speech:  Clear and Coherent  Volume:  Normal  Mood: "Fine"  Affect:  Congruent, somewhat depressed, able to laugh appropriately  Thought Process:  Coherent  Orientation:  Full (Time, Place, and Person)  Thought Content: Logical   Suicidal Thoughts:  No  Homicidal Thoughts:  No  Memory:  Immediate;   Good  Judgement:  fair  Insight:  fair  Psychomotor Activity:  Normal  Concentration:  Concentration: Good  Recall:  Good  Fund of Knowledge: Good  Language: Good  Akathisia:  No  Handed:    AIMS (if indicated): not done  Assets:  Communication Skills Desire for Improvement Financial Resources/Insurance Housing Leisure Time Physical Health  ADL's:  Intact  Cognition: WNL  Sleep: Poor     Metabolic Disorder Labs: Lab Results  Component Value Date   HGBA1C 5.6 09/17/2021   No results found  for: "PROLACTIN" Lab Results  Component Value Date   CHOL 161 09/30/2022   TRIG 91 09/30/2022   HDL 74 09/30/2022   CHOLHDL 2.2 09/30/2022   VLDL 40.9 06/22/2018   LDLCALC 70 09/30/2022   LDLCALC 90 09/17/2021   Lab Results  Component Value Date   TSH 1.480 09/17/2021   TSH 1.310 04/05/2020    Therapeutic Level Labs: No results found for: "LITHIUM" No results found for: "VALPROATE" No results found for: "CBMZ"  Screenings: GAD-7    Flowsheet Row Office Visit from 07/15/2022 in [facility] Office Visit from 07/01/2022 in [facility] at [facility]  Total GAD-7 Score 7 0      Mini-Mental    Flowsheet Row Office Visit from 04/17/2020 in [facility] at [facility]  Total Score (max 30 points ) 30      PHQ2-9    Flowsheet Row Office Visit from 09/30/2022 in [facility] Office Visit from 07[facility]15/2022 in [facility] at [facility] Office Visit from 07/01/2022 in [facility] at [facility] Office Visit from 02[facility]26/2022 in [facility] at [facility] Office Visit from 01/27/2022 in [facility] Total Score 0 2 0 0 0  PHQ-9 Total Score 7 8 0 0 0      Flowsheet Row ED from 11/03/2022 in [facility] ED from 09/22/2022  in [facility] at [facility]) ED from 05/14/2022 in [facility] at [facility])  C-SSRS RISK CATEGORY No Risk No Risk No Risk       Collaboration of Care: none  A total of 30 minutes was spent involved in face to face clinical care, chart review, documentation.   Carlyn Reichert, MD 12/26/2022, 7:36 AM

## 2022-12-29 NOTE — Addendum Note (Signed)
Addended by: Everlena Cooper on: 12/29/2022 08:24 AM   Modules accepted: Level of Service

## 2022-12-31 DIAGNOSIS — M4317 Spondylolisthesis, lumbosacral region: Secondary | ICD-10-CM | POA: Diagnosis not present

## 2022-12-31 DIAGNOSIS — Z6825 Body mass index (BMI) 25.0-25.9, adult: Secondary | ICD-10-CM | POA: Diagnosis not present

## 2023-01-01 ENCOUNTER — Ambulatory Visit: Payer: Self-pay

## 2023-01-01 ENCOUNTER — Telehealth: Payer: Medicare HMO | Admitting: Emergency Medicine

## 2023-01-01 DIAGNOSIS — R051 Acute cough: Secondary | ICD-10-CM

## 2023-01-01 DIAGNOSIS — J329 Chronic sinusitis, unspecified: Secondary | ICD-10-CM

## 2023-01-01 MED ORDER — BENZONATATE 100 MG PO CAPS: 100.0000 mg | ORAL_CAPSULE | Freq: Two times a day (BID) | ORAL | 0 refills | Status: AC | PRN

## 2023-01-01 MED ORDER — AZITHROMYCIN 250 MG PO TABS: ORAL_TABLET | ORAL | 0 refills | Status: DC

## 2023-01-01 MED ORDER — PREDNISONE 20 MG PO TABS: 40.0000 mg | ORAL_TABLET | Freq: Every day | ORAL | 0 refills | Status: DC

## 2023-01-01 NOTE — Telephone Encounter (Signed)
Chief Complaint: Sinus Congestion and pressure  Symptoms: Nasal congestion, sinus headache, nasal discharge light brown, productive cough Frequency: constant  Pertinent Negatives: Patient denies current fever, nausea, vomiting, chest pain  Disposition: [] ED /[] Urgent Care (no appt availability in office) / [] Appointment(In office/virtual)/ [x]  [facility] [facility]/ [] Home Care/ [] Refused Recommended Disposition /[] [place] [facility]/ []  Follow-up with PCP Additional Notes: Patient sated she has had a sinus headache and nasal congestion for about 2 weeks now. Patient stated she has tried over the counter medication and prescribed nasal sprays without much improvement. Patient stated the last time her symptoms lasted this long she was prescribed an antibiotic and a steroid that knocked the symptoms right out. Patient stated she has well water so she does not feel comfortable making her own saline nasal solution. Care advice was given and patient has been scheduled for a virtual urgent care visit today due to no availability in the office this week.   Summary: sinus   Pt called with congestion ans upper resp issues.  She wanted n appt but there is nothing at [facility]  [phone]     Reason for Disposition  [1] Sinus congestion (pressure, fullness) AND [2] present > 10 days  Answer Assessment - Initial Assessment Questions 1. LOCATION: "Where does it hurt?"      Around the nose  2. ONSET: "When did the sinus pain start?"  (e.g., hours, days)      2 weeks ago  3. SEVERITY: "How bad is the pain?"   (Scale 1-10; mild, moderate or severe)   - MILD (1-3): doesn't interfere with normal activities    - MODERATE (4-7): interferes with normal activities (e.g., work or school) or awakens from sleep   - SEVERE (8-10): excruciating pain and patient unable to do any normal activities        3/10 4. RECURRENT SYMPTOM: "Have you ever had sinus problems before?" If Yes, ask: "When was the last  time?" and "What happened that time?"      Yes, I usually get a steroid  5. NASAL CONGESTION: "Is the nose blocked?" If Yes, ask: "Can you open it or must you breathe through your mouth?"     I've been breathing through my mouth  6. NASAL DISCHARGE: "Do you have discharge from your nose?" If so ask, "What color?"     Yes, light brown  7. FEVER: "Do you have a fever?" If Yes, ask: "What is it, how was it measured, and when did it start?"      I'm not sure, I haven't checked  8. OTHER SYMPTOMS: "Do you have any other symptoms?" (e.g., sore throat, cough, earache, difficulty breathing)     Post nasal drip, cough, headache, earache, sore throat  Protocols used: Sinus Pain or Congestion-A-AH

## 2023-01-01 NOTE — Progress Notes (Signed)
Virtual Visit Consent   [name], you are scheduled for a virtual visit with a [facility] provider today. Just as with appointments in the office, your consent must be obtained to participate. Your consent will be active for this visit and any virtual visit you may have with one of our providers in the next 365 days. If you have a MyChart account, a copy of this consent can be sent to you electronically.  As this is a virtual visit, video technology does not allow for your provider to perform a traditional examination. This may limit your provider's ability to fully assess your condition. If your provider identifies any concerns that need to be evaluated in person or the need to arrange testing (such as labs, EKG, etc.), we will make arrangements to do so. Although advances in technology are sophisticated, we cannot ensure that it will always work on either your end or our end. If the connection with a video visit is poor, the visit may have to be switched to a telephone visit. With either a video or telephone visit, we are not always able to ensure that we have a secure connection.  By engaging in this virtual visit, you consent to the provision of healthcare and authorize for your insurance to be billed (if applicable) for the services provided during this visit. Depending on your insurance coverage, you may receive a charge related to this service.  I need to obtain your verbal consent now. Are you willing to proceed with your visit today? [name] has provided verbal consent on 01/01/2023 for a virtual visit (video or telephone). Janice Horseman, PA-C  Date: 01/01/2023 2:25 PM  Virtual Visit via Video Note   I, [name], connected with  [name]  ([id number], 1965/05/03) on 01/01/23 at  2:30 PM EDT by a video-enabled telemedicine application and verified that I am speaking with the correct person using two identifiers.  Location: Patient: Virtual Visit Location Patient:  Home Provider: Virtual Visit Location Provider: Home Office   I discussed the limitations of evaluation and management by telemedicine and the availability of in person appointments. The patient expressed understanding and agreed to proceed.    History of Present Illness: [name] is a 57 y.o. who identifies as a female who was assigned female at birth, and is being seen today for sinus congestion for the past 2 weeks.  Reports associated cough and swollen lymph nodes.  Reports cough and chest congestion.  States that this is typical for her this time of year.  No longer running fevers.  Had GI symptoms about a week ago, but this has resolved.  HPI: HPI  Problems:  Patient Active Problem List   Diagnosis Date Noted   Bipolar disorder, in partial remission, most recent episode depressed (HCC) 12/26/2022   Alcohol use disorder, moderate, in sustained remission (HCC) 12/26/2022   Avascular necrosis of bone (HCC) 07/01/2022   Cervical radiculopathy 07/01/2022   Closed fracture of acetabulum (HCC) 07/01/2022   Compression fracture of L1 lumbar vertebra (HCC) 07/01/2022   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   Hiatal hernia 04/21/2020   Other chest pain 04/07/2020   Acute non intractable tension-type headache 04/07/2020   Medial epicondylitis of left elbow 07/19/2019   Pain in left hip 07/19/2019   Asthma exacerbation 11/22/2018   GERD (gastroesophageal reflux disease) 06/22/2018   Cannabis use disorder, moderate, dependence (HCC) 04/22/2018   Insomnia 04/15/2018   Non-intractable vomiting    Elevated  blood pressure reading without diagnosis of hypertension 06/13/2017   GAD (generalized anxiety disorder) 05/23/2017   Synovial cyst of lumbar spine 01/16/2016   OSA (obstructive sleep apnea) 01/09/2016   Asthma 11/21/2015   Chronic pain syndrome 11/21/2015   HLD (hyperlipidemia) 09/12/2014    Allergies:  Allergies  Allergen Reactions   Other    Effexor [Venlafaxine]  Other (See Comments)    UNSPECIFIED REACTION, headaches, felt funny, withdrawal with missed dose    Lamotrigine Rash and Other (See Comments)   Medications:  Current Outpatient Medications:    azithromycin (ZITHROMAX) 250 MG tablet, Take 2 tabs today, then take 1 tab daily until gone., Disp: 6 tablet, Rfl: 0   benzonatate (TESSALON) 100 MG capsule, Take 1 capsule (100 mg total) by mouth 2 (two) times daily as needed for cough., Disp: 20 capsule, Rfl: 0   predniSONE (DELTASONE) 20 MG tablet, Take 2 tablets (40 mg total) by mouth daily., Disp: 10 tablet, Rfl: 0   albuterol (VENTOLIN HFA) 108 (90 Base) MCG/ACT inhaler, INHALE 2 PUFFS BY MOUTH EVERY 4 TO 6 HOURS AS NEEDED, Disp: 18 each, Rfl: 1   aspirin 81 MG tablet, Take 81 mg by mouth daily., Disp: , Rfl:    buPROPion (WELLBUTRIN XL) 300 MG 24 hr tablet, Take 1 tablet (300 mg total) by mouth daily., Disp: 30 tablet, Rfl: 3   Calcium Carb-Cholecalciferol (OYSTER SHELL CALCIUM W/D) 500-5 MG-MCG TABS, TAKE 1 TABLET BY MOUTH THREE TIMES A DAY, Disp: 90 tablet, Rfl: 6   calcium-vitamin D (OSCAL WITH D) 500-200 MG-UNIT tablet, Take 1 tablet by mouth 3 (three) times daily., Disp: 90 tablet, Rfl: 6   cyclobenzaprine (FLEXERIL) 10 MG tablet, Take 10 mg by mouth 2 (two) times daily as needed., Disp: , Rfl:    fluticasone (FLONASE) 50 MCG/ACT nasal spray, Place 2 sprays into both nostrils daily as needed for allergies or rhinitis., Disp: 16 g, Rfl: 5   Fluticasone-Umeclidin-Vilant (TRELEGY ELLIPTA) 200-62.5-25 MCG/ACT AEPB, Inhale 1 puff into the lungs daily., Disp: 60 each, Rfl: 5   gabapentin (NEURONTIN) 300 MG capsule, Take 1 capsule by mouth 3 (three) times daily., Disp: , Rfl:    HYDROcodone bit-homatropine (HYCODAN) 5-1.5 MG/5ML syrup, Take 5 mLs by mouth every 6 (six) hours as needed for cough., Disp: 120 mL, Rfl: 0   ipratropium (ATROVENT) 0.03 % nasal spray, 2 sprays each nostril twice a day as needed for runny nose/drainage down throat., Disp: 30  mL, Rfl: 5   levocetirizine (XYZAL) 5 MG tablet, TAKE 1 TABLET EVERY DAY AS NEEDED, Disp: 30 tablet, Rfl: 5   mepolizumab (NUCALA) 100 MG/ML SOSY, INJECT 100MG  SUBCUTANEOUSLY EVERY 4 WEEKS, Disp: 1 mL, Rfl: 11   montelukast (SINGULAIR) 10 MG tablet, TAKE 1 TABLET BY MOUTH EVERYDAY AT BEDTIME, Disp: 30 tablet, Rfl: 5   morphine (MSIR) 15 MG tablet, Take 15 mg by mouth every 4 (four) hours as needed for severe pain., Disp: , Rfl:    pantoprazole (PROTONIX) 40 MG tablet, Take 1 tablet (40 mg total) by mouth daily., Disp: 30 tablet, Rfl: 5   promethazine (PHENERGAN) 25 MG tablet, Take 1 tablet (25 mg total) by mouth every 8 (eight) hours as needed for nausea or vomiting. (Patient not taking: Reported on 10/22/2022), Disp: 20 tablet, Rfl: 0   simvastatin (ZOCOR) 20 MG tablet, TAKE 1 TABLET BY MOUTH EVERY DAY AT 6PM, Disp: 90 tablet, Rfl: 0   triamcinolone (NASACORT) 55 MCG/ACT AERO nasal inhaler, , Disp: , Rfl:  zonisamide (ZONEGRAN) 100 MG capsule, Take 1 capsule (100 mg total) by mouth at bedtime., Disp: 30 capsule, Rfl: 3  Current Facility-Administered Medications:    mepolizumab (NUCALA) injection 100 mg, 100 mg, Subcutaneous, Q28 days, Marcelyn Bruins, MD, 100 mg at 12/23/22 1034  Observations/Objective: Patient is well-developed, well-nourished in no acute distress.  Resting comfortably at home.  Head is normocephalic, atraumatic.  No labored breathing.  Speech is clear and coherent with logical content.  Patient is alert and oriented at baseline.    Assessment and Plan: 1. Sinusitis, unspecified chronicity, unspecified location  2. Acute cough  Meds ordered this encounter  Medications   predniSONE (DELTASONE) 20 MG tablet    Sig: Take 2 tablets (40 mg total) by mouth daily.    Dispense:  10 tablet    Refill:  0    Order Specific Question:   Supervising Provider    Answer:   Merrilee Jansky [[id number]]   azithromycin (ZITHROMAX) 250 MG tablet    Sig: Take 2 tabs  today, then take 1 tab daily until gone.    Dispense:  6 tablet    Refill:  0    Order Specific Question:   Supervising Provider    Answer:   Merrilee Jansky [[id number]]   benzonatate (TESSALON) 100 MG capsule    Sig: Take 1 capsule (100 mg total) by mouth 2 (two) times daily as needed for cough.    Dispense:  20 capsule    Refill:  0    Order Specific Question:   Supervising Provider    Answer:   Merrilee Jansky [[id number]]    Patient states that Amox/augmentin never works for her, but a Z-pak always seems to take care if it.  I'll send this and some prednisone and tessalon perles.  PCP followup if not improving.  Follow Up Instructions: I discussed the assessment and treatment plan with the patient. The patient was provided an opportunity to ask questions and all were answered. The patient agreed with the plan and demonstrated an understanding of the instructions.  A copy of instructions were sent to the patient via MyChart unless otherwise noted below.     The patient was advised to call back or seek an in-person evaluation if the symptoms worsen or if the condition fails to improve as anticipated.    Janice Horseman, PA-C

## 2023-01-01 NOTE — Patient Instructions (Signed)
[name], thank you for joining Roxy Horseman, PA-C for today's virtual visit.  While this provider is not your primary care provider (PCP), if your PCP is located in our provider database this encounter information will be shared with them immediately following your visit.   A [facility] MyChart account gives you access to today's visit and all your visits, tests, and labs performed at [facility] " click here if you don't have a [facility] MyChart account or go to [email or web address]  Consent: (Patient) [name] provided verbal consent for this virtual visit at the beginning of the encounter.  Current Medications:  Current Outpatient Medications:    azithromycin (ZITHROMAX) 250 MG tablet, Take 2 tabs today, then take 1 tab daily until gone., Disp: 6 tablet, Rfl: 0   benzonatate (TESSALON) 100 MG capsule, Take 1 capsule (100 mg total) by mouth 2 (two) times daily as needed for cough., Disp: 20 capsule, Rfl: 0   predniSONE (DELTASONE) 20 MG tablet, Take 2 tablets (40 mg total) by mouth daily., Disp: 10 tablet, Rfl: 0   albuterol (VENTOLIN HFA) 108 (90 Base) MCG/ACT inhaler, INHALE 2 PUFFS BY MOUTH EVERY 4 TO 6 HOURS AS NEEDED, Disp: 18 each, Rfl: 1   aspirin 81 MG tablet, Take 81 mg by mouth daily., Disp: , Rfl:    buPROPion (WELLBUTRIN XL) 300 MG 24 hr tablet, Take 1 tablet (300 mg total) by mouth daily., Disp: 30 tablet, Rfl: 3   Calcium Carb-Cholecalciferol (OYSTER SHELL CALCIUM W/D) 500-5 MG-MCG TABS, TAKE 1 TABLET BY MOUTH THREE TIMES A DAY, Disp: 90 tablet, Rfl: 6   calcium-vitamin D (OSCAL WITH D) 500-200 MG-UNIT tablet, Take 1 tablet by mouth 3 (three) times daily., Disp: 90 tablet, Rfl: 6   cyclobenzaprine (FLEXERIL) 10 MG tablet, Take 10 mg by mouth 2 (two) times daily as needed., Disp: , Rfl:    fluticasone (FLONASE) 50 MCG/ACT nasal spray, Place 2 sprays into both nostrils daily as needed for allergies or rhinitis., Disp: 16 g, Rfl: 5    Fluticasone-Umeclidin-Vilant (TRELEGY ELLIPTA) 200-62.5-25 MCG/ACT AEPB, Inhale 1 puff into the lungs daily., Disp: 60 each, Rfl: 5   gabapentin (NEURONTIN) 300 MG capsule, Take 1 capsule by mouth 3 (three) times daily., Disp: , Rfl:    HYDROcodone bit-homatropine (HYCODAN) 5-1.5 MG/5ML syrup, Take 5 mLs by mouth every 6 (six) hours as needed for cough., Disp: 120 mL, Rfl: 0   ipratropium (ATROVENT) 0.03 % nasal spray, 2 sprays each nostril twice a day as needed for runny nose/drainage down throat., Disp: 30 mL, Rfl: 5   levocetirizine (XYZAL) 5 MG tablet, TAKE 1 TABLET EVERY DAY AS NEEDED, Disp: 30 tablet, Rfl: 5   mepolizumab (NUCALA) 100 MG/ML SOSY, INJECT 100MG  SUBCUTANEOUSLY EVERY 4 WEEKS, Disp: 1 mL, Rfl: 11   montelukast (SINGULAIR) 10 MG tablet, TAKE 1 TABLET BY MOUTH EVERYDAY AT BEDTIME, Disp: 30 tablet, Rfl: 5   morphine (MSIR) 15 MG tablet, Take 15 mg by mouth every 4 (four) hours as needed for severe pain., Disp: , Rfl:    pantoprazole (PROTONIX) 40 MG tablet, Take 1 tablet (40 mg total) by mouth daily., Disp: 30 tablet, Rfl: 5   promethazine (PHENERGAN) 25 MG tablet, Take 1 tablet (25 mg total) by mouth every 8 (eight) hours as needed for nausea or vomiting. (Patient not taking: Reported on 10/22/2022), Disp: 20 tablet, Rfl: 0   simvastatin (ZOCOR) 20 MG tablet, TAKE 1 TABLET BY MOUTH EVERY DAY AT 6PM, Disp: 90  tablet, Rfl: 0   triamcinolone (NASACORT) 55 MCG/ACT AERO nasal inhaler, , Disp: , Rfl:    zonisamide (ZONEGRAN) 100 MG capsule, Take 1 capsule (100 mg total) by mouth at bedtime., Disp: 30 capsule, Rfl: 3  Current Facility-Administered Medications:    mepolizumab (NUCALA) injection 100 mg, 100 mg, Subcutaneous, Q28 days, Marcelyn Bruins, MD, 100 mg at 12/23/22 1034   Medications ordered in this encounter:  Meds ordered this encounter  Medications   predniSONE (DELTASONE) 20 MG tablet    Sig: Take 2 tablets (40 mg total) by mouth daily.    Dispense:  10 tablet     Refill:  0    Order Specific Question:   Supervising Provider    Answer:   Merrilee Jansky [[id number]]   azithromycin (ZITHROMAX) 250 MG tablet    Sig: Take 2 tabs today, then take 1 tab daily until gone.    Dispense:  6 tablet    Refill:  0    Order Specific Question:   Supervising Provider    Answer:   Merrilee Jansky [[id number]]   benzonatate (TESSALON) 100 MG capsule    Sig: Take 1 capsule (100 mg total) by mouth 2 (two) times daily as needed for cough.    Dispense:  20 capsule    Refill:  0    Order Specific Question:   Supervising Provider    Answer:   Merrilee Jansky [id number]     *If you need refills on other medications prior to your next appointment, please contact your pharmacy*  Follow-Up: Call back or seek an in-person evaluation if the symptoms worsen or if the condition fails to improve as anticipated.  [facility] [phone]  Other Instructions    If you have been instructed to have an in-person evaluation today at a local Urgent Care facility, please use the link below. It will take you to a list of all of our available [facility], including address, phone number and hours of operation. Please do not delay care.  [facility]  If you or a family member do not have a primary care provider, use the link below to schedule a visit and establish care. When you choose a [facility] primary care physician or advanced practice provider, you gain a long-term partner in health. Find a Primary Care Provider  Learn more about [facility] in-office and virtual care options: [facility] - Get Care Now

## 2023-01-02 NOTE — Telephone Encounter (Signed)
Patient was taking of via video visit.

## 2023-01-08 ENCOUNTER — Ambulatory Visit (INDEPENDENT_AMBULATORY_CARE_PROVIDER_SITE_OTHER): Payer: Medicare HMO | Admitting: Family Medicine

## 2023-01-08 ENCOUNTER — Ambulatory Visit: Payer: Medicare HMO

## 2023-01-08 ENCOUNTER — Encounter: Payer: Self-pay | Admitting: Family Medicine

## 2023-01-08 VITALS — BP 163/93 | HR 78 | Temp 98.4°F | Resp 18 | Ht 60.79 in | Wt 134.8 lb

## 2023-01-08 DIAGNOSIS — R5383 Other fatigue: Secondary | ICD-10-CM

## 2023-01-08 DIAGNOSIS — R053 Chronic cough: Secondary | ICD-10-CM | POA: Diagnosis not present

## 2023-01-08 DIAGNOSIS — R059 Cough, unspecified: Secondary | ICD-10-CM | POA: Diagnosis not present

## 2023-01-08 NOTE — Progress Notes (Signed)
Established Patient Office Visit  Subjective    Patient ID: [name], female    DOB: Aug 07, 1965  Age: 57 y.o. MRN: [id number]  CC:  Chief Complaint  Patient presents with   Sinus Problem    Weak, feels like immune system is down    HPI [name] presents with complaint of persistent cough for several weeks with fatigue. She denies fever/chills or viral sx. Cough is minimally productive and she reports rattling in her chest.   Outpatient Encounter Medications as of 01/08/2023  Medication Sig   albuterol (VENTOLIN HFA) 108 (90 Base) MCG/ACT inhaler INHALE 2 PUFFS BY MOUTH EVERY 4 TO 6 HOURS AS NEEDED   aspirin 81 MG tablet Take 81 mg by mouth daily.   azithromycin (ZITHROMAX) 250 MG tablet Take 2 tabs today, then take 1 tab daily until gone.   benzonatate (TESSALON) 100 MG capsule Take 1 capsule (100 mg total) by mouth 2 (two) times daily as needed for cough.   buPROPion (WELLBUTRIN XL) 300 MG 24 hr tablet Take 1 tablet (300 mg total) by mouth daily.   Calcium Carb-Cholecalciferol (OYSTER SHELL CALCIUM W/D) 500-5 MG-MCG TABS TAKE 1 TABLET BY MOUTH THREE TIMES A DAY   calcium-vitamin D (OSCAL WITH D) 500-200 MG-UNIT tablet Take 1 tablet by mouth 3 (three) times daily.   fluticasone (FLONASE) 50 MCG/ACT nasal spray Place 2 sprays into both nostrils daily as needed for allergies or rhinitis.   Fluticasone-Umeclidin-Vilant (TRELEGY ELLIPTA) 200-62.5-25 MCG/ACT AEPB Inhale 1 puff into the lungs daily.   gabapentin (NEURONTIN) 300 MG capsule Take 1 capsule by mouth 3 (three) times daily.   HYDROcodone bit-homatropine (HYCODAN) 5-1.5 MG/5ML syrup Take 5 mLs by mouth every 6 (six) hours as needed for cough.   ipratropium (ATROVENT) 0.03 % nasal spray 2 sprays each nostril twice a day as needed for runny nose/drainage down throat.   levocetirizine (XYZAL) 5 MG tablet TAKE 1 TABLET EVERY DAY AS NEEDED   mepolizumab (NUCALA) 100 MG/ML SOSY INJECT 100MG  SUBCUTANEOUSLY EVERY 4 WEEKS    montelukast (SINGULAIR) 10 MG tablet TAKE 1 TABLET BY MOUTH EVERYDAY AT BEDTIME   morphine (MSIR) 15 MG tablet Take 15 mg by mouth every 4 (four) hours as needed for severe pain.   pantoprazole (PROTONIX) 40 MG tablet Take 1 tablet (40 mg total) by mouth daily.   predniSONE (DELTASONE) 20 MG tablet Take 2 tablets (40 mg total) by mouth daily.   simvastatin (ZOCOR) 20 MG tablet TAKE 1 TABLET BY MOUTH EVERY DAY AT 6PM   SOMA 350 MG tablet take 1 tablet by oral route every 6 hours as needed   triamcinolone (NASACORT) 55 MCG/ACT AERO nasal inhaler    zonisamide (ZONEGRAN) 100 MG capsule Take 1 capsule (100 mg total) by mouth at bedtime.   cyclobenzaprine (FLEXERIL) 10 MG tablet Take 10 mg by mouth 2 (two) times daily as needed. (Patient not taking: Reported on 01/08/2023)   promethazine (PHENERGAN) 25 MG tablet Take 1 tablet (25 mg total) by mouth every 8 (eight) hours as needed for nausea or vomiting. (Patient not taking: Reported on 10/22/2022)   Facility-Administered Encounter Medications as of 01/08/2023  Medication   mepolizumab (NUCALA) injection 100 mg    Past Medical History:  Diagnosis Date   Anxiety    Arthritis    neck, knees, shoulders   Asthma    Bipolar disorder (HCC)    currently feeling MANIC- 10/02/2016   Depression    GERD (gastroesophageal reflux disease)  Hip fracture (HCC) 06/2019   History of blood transfusion    as a newborn    History of lump of left breast    Hyperlipidemia    Lumbar pseudoarthrosis    Motion sickness    cars   OSA (obstructive sleep apnea) 01/09/2016   can't afford CPAP   Personality disorder (HCC)    PONV (postoperative nausea and vomiting)    Post traumatic stress disorder (PTSD)    Substance abuse (HCC)    Synovial cyst     Past Surgical History:  Procedure Laterality Date   COLONOSCOPY WITH PROPOFOL N/A 11/04/2017   Procedure: COLONOSCOPY WITH PROPOFOL;  Surgeon: Toney Reil, MD;  Location: [facility];  Service:  Endoscopy;  Laterality: N/A;   ESOPHAGOGASTRODUODENOSCOPY (EGD) WITH PROPOFOL N/A 11/04/2017   Procedure: ESOPHAGOGASTRODUODENOSCOPY (EGD) WITH PROPOFOL with biopsies;  Surgeon: Toney Reil, MD;  Location: [facility];  Service: Endoscopy;  Laterality: N/A;  sleep apnea   KNEE SURGERY Left    x5, post basketball injury   LUMBAR LAMINECTOMY/DECOMPRESSION MICRODISCECTOMY Left 01/22/2016   Procedure: Laminectomy for facet/synovial cyst - left - Lumbar four - lumbar five;  Surgeon: Julio Sicks, MD;  Location: [facility];  Service: Neurosurgery;  Laterality: Left;  Laminectomy for facet/synovial cyst - left - Lumbar four - lumbar five   POLYPECTOMY N/A 11/04/2017   Procedure: POLYPECTOMY INTESTINAL;  Surgeon: Toney Reil, MD;  Location: [facility];  Service: Endoscopy;  Laterality: N/A;   SHOULDER SURGERY Left    x2   SPINE SURGERY N/A    [employer] 08/07/2019   TOE SURGERY Bilateral    bone spurs    Family History  Problem Relation Age of Onset   Heart disease Father    Hyperlipidemia Father    Alcohol abuse Brother    Alcohol abuse Paternal Uncle    Breast cancer Maternal Aunt        70's   Colon cancer Neg Hx     Social History   Socioeconomic History   Marital status: Single    Spouse name: Not on file   Number of children: 0   Years of education: Not on file   Highest education level: Bachelor's degree (e.g., BA, AB, BS)  Occupational History   Not on file  Tobacco Use   Smoking status: Former    Current packs/day: 0.00    Average packs/day: 1 pack/day for 1 year (1.0 ttl pk-yrs)    Types: Cigarettes    Start date: 04/16/1995    Quit date: 04/15/1996    Years since quitting: 26.7    Passive exposure: Past   Smokeless tobacco: Never   Tobacco comments:    started back again in 2016, smoked for about 6 mo and then quit  Vaping Use   Vaping status: Never Used  Substance and Sexual Activity   Alcohol use: No    Alcohol/week: 0.0 standard drinks of  alcohol    Comment: quit 20 years   Drug use: Yes    Frequency: 14.0 times per week    Types: Marijuana    Comment: 20 yrs. ago- cocaine    Sexual activity: Not Currently  Other Topics Concern   Not on file  Social History Narrative   Not on file   Social Determinants of Health   Financial Resource Strain: Medium Risk (07/01/2022)   Overall Financial Resource Strain (CARDIA)    Difficulty of Paying Living Expenses: Somewhat hard  Food Insecurity: No Food  Insecurity (07/01/2022)   Hunger Vital Sign    Worried About Running Out of Food in the Last Year: Never true    Ran Out of Food in the Last Year: Never true  Transportation Needs: No Transportation Needs (07/01/2022)   PRAPARE - Administrator, Civil Service (Medical): No    Lack of Transportation (Non-Medical): No  Physical Activity: Unknown (07/01/2022)   Exercise Vital Sign    Days of Exercise per Week: Patient declined    Minutes of Exercise per Session: Not on file  Stress: Patient Declined (07/01/2022)   [employer] of Occupational Health - Occupational Stress Questionnaire    Feeling of Stress : Patient declined  Social Connections: Moderately Isolated (07/01/2022)   Social Connection and Isolation Panel [NHANES]    Frequency of Communication with Friends and Family: More than three times a week    Frequency of Social Gatherings with Friends and Family: More than three times a week    Attends Religious Services: 1 to 4 times per year    Active Member of [employer] or Organizations: No    Attends Banker Meetings: Not on file    Marital Status: Never married  Intimate Partner Violence: Not At Risk (01/08/2023)   Humiliation, Afraid, Rape, and Kick questionnaire    Fear of Current or Ex-Partner: No    Emotionally Abused: No    Physically Abused: No    Sexually Abused: No    Review of Systems  Constitutional:  Positive for malaise/fatigue. Negative for chills and fever.        Objective     BP (!) 163/93 (BP Location: Right Arm, Patient Position: Sitting, Cuff Size: Normal)   Pulse 78   Temp 98.4 F (36.9 C) (Oral)   Resp 18   Ht 5' 0.79" (1.544 m)   Wt 134 lb 12.8 oz (61.1 kg)   SpO2 98%   BMI 25.65 kg/m   Physical Exam Vitals and nursing note reviewed.  Constitutional:      General: She is not in acute distress. Cardiovascular:     Rate and Rhythm: Normal rate and regular rhythm.  Pulmonary:     Effort: Pulmonary effort is normal.     Breath sounds: Rhonchi present.  Abdominal:     Palpations: Abdomen is soft.     Tenderness: There is no abdominal tenderness.  Neurological:     General: No focal deficit present.     Mental Status: She is alert and oriented to person, place, and time.         Assessment & Plan:   1. Other fatigue  - CBC with Differential - CMP14+EGFR - TSH - DG Chest 1 View; Future  2. Persistent cough As above  Return in about 2 weeks (around 01/22/2023) for AWV with health coach.   Tommie Raymond, MD

## 2023-01-08 NOTE — Progress Notes (Signed)
I called patient and made her aware that her chest xray was negative.

## 2023-01-09 ENCOUNTER — Telehealth: Payer: Self-pay

## 2023-01-09 LAB — CBC WITH DIFFERENTIAL/PLATELET
Basophils Absolute: 0 10*3/uL (ref 0.0–0.2)
Basos: 0 %
EOS (ABSOLUTE): 0 10*3/uL (ref 0.0–0.4)
Eos: 1 %
Hematocrit: 42.7 % (ref 34.0–46.6)
Hemoglobin: 14 g/dL (ref 11.1–15.9)
Immature Grans (Abs): 0 10*3/uL (ref 0.0–0.1)
Immature Granulocytes: 0 %
Lymphocytes Absolute: 2.9 10*3/uL (ref 0.7–3.1)
Lymphs: 33 %
MCH: 30.2 pg (ref 26.6–33.0)
MCHC: 32.8 g/dL (ref 31.5–35.7)
MCV: 92 fL (ref 79–97)
Monocytes Absolute: 0.5 10*3/uL (ref 0.1–0.9)
Monocytes: 6 %
Neutrophils Absolute: 5.3 10*3/uL (ref 1.4–7.0)
Neutrophils: 60 %
Platelets: 427 10*3/uL (ref 150–450)
RBC: 4.63 x10E6/uL (ref 3.77–5.28)
RDW: 13.9 % (ref 11.7–15.4)
WBC: 8.8 10*3/uL (ref 3.4–10.8)

## 2023-01-09 LAB — CMP14+EGFR
ALT: 15 [IU]/L (ref 0–32)
AST: 14 [IU]/L (ref 0–40)
Albumin: 4.3 g/dL (ref 3.8–4.9)
Alkaline Phosphatase: 101 [IU]/L (ref 44–121)
BUN/Creatinine Ratio: 9 (ref 9–23)
BUN: 7 mg/dL (ref 6–24)
Bilirubin Total: 0.3 mg/dL (ref 0.0–1.2)
CO2: 21 mmol/L (ref 20–29)
Calcium: 9.1 mg/dL (ref 8.7–10.2)
Chloride: 99 mmol/L (ref 96–106)
Creatinine, Ser: 0.8 mg/dL (ref 0.57–1.00)
Globulin, Total: 2.5 g/dL (ref 1.5–4.5)
Glucose: 69 mg/dL — ABNORMAL LOW (ref 70–99)
Potassium: 5 mmol/L (ref 3.5–5.2)
Sodium: 136 mmol/L (ref 134–144)
Total Protein: 6.8 g/dL (ref 6.0–8.5)
eGFR: 85 mL/min/{1.73_m2} (ref >59)

## 2023-01-09 LAB — TSH: TSH: 2.38 u[IU]/mL (ref 0.450–4.500)

## 2023-01-09 NOTE — Telephone Encounter (Signed)
Pt given lab results per notes of Dr. Andrey Campanile on 01/09/23. Pt verbalized understanding. She's says she's still not feeling well, maybe due to lack of sleep. I asked is she taking anything OTC to help sleep, like Benadryl or Melatonin. She says she's afraid to take anything because she was on trazodone in the past and was so groggy in the morning. She says she will reach out to her psychiatrist to discuss as well. She says she's only sleeping maybe 4 hours/week.    Results are normal/clinically unremarkable - no further management indicated at this time  Written by Georganna Skeans, MD on 01/09/2023  7:47 AM EST Seen by patient [name] on 01/09/2023 11:37 AM

## 2023-01-09 NOTE — Telephone Encounter (Signed)
FYI

## 2023-01-12 ENCOUNTER — Encounter: Payer: Self-pay | Admitting: Family Medicine

## 2023-01-20 ENCOUNTER — Ambulatory Visit: Payer: Medicare HMO | Admitting: *Deleted

## 2023-01-20 DIAGNOSIS — J4550 Severe persistent asthma, uncomplicated: Secondary | ICD-10-CM

## 2023-01-21 DIAGNOSIS — D225 Melanocytic nevi of trunk: Secondary | ICD-10-CM | POA: Diagnosis not present

## 2023-01-21 DIAGNOSIS — L570 Actinic keratosis: Secondary | ICD-10-CM | POA: Diagnosis not present

## 2023-01-21 DIAGNOSIS — Z1283 Encounter for screening for malignant neoplasm of skin: Secondary | ICD-10-CM | POA: Diagnosis not present

## 2023-01-21 DIAGNOSIS — L820 Inflamed seborrheic keratosis: Secondary | ICD-10-CM | POA: Diagnosis not present

## 2023-01-21 DIAGNOSIS — X32XXXA Exposure to sunlight, initial encounter: Secondary | ICD-10-CM | POA: Diagnosis not present

## 2023-02-08 ENCOUNTER — Other Ambulatory Visit: Payer: Self-pay | Admitting: Family Medicine

## 2023-02-08 ENCOUNTER — Other Ambulatory Visit: Payer: Self-pay | Admitting: Allergy

## 2023-02-12 ENCOUNTER — Other Ambulatory Visit: Payer: Self-pay

## 2023-02-12 DIAGNOSIS — M4317 Spondylolisthesis, lumbosacral region: Secondary | ICD-10-CM | POA: Diagnosis not present

## 2023-02-12 DIAGNOSIS — S32010A Wedge compression fracture of first lumbar vertebra, initial encounter for closed fracture: Secondary | ICD-10-CM | POA: Diagnosis not present

## 2023-02-17 ENCOUNTER — Ambulatory Visit: Payer: Medicare HMO

## 2023-02-20 ENCOUNTER — Ambulatory Visit (INDEPENDENT_AMBULATORY_CARE_PROVIDER_SITE_OTHER): Payer: Medicare HMO | Admitting: *Deleted

## 2023-02-20 DIAGNOSIS — J4550 Severe persistent asthma, uncomplicated: Secondary | ICD-10-CM | POA: Diagnosis not present

## 2023-03-06 ENCOUNTER — Ambulatory Visit: Payer: Self-pay | Admitting: *Deleted

## 2023-03-06 NOTE — Telephone Encounter (Signed)
  Chief Complaint: cough, congestion , requesting cough syrup and a steroid Symptoms: cough productive at times. Did not report color of sputum. Not able to sleep at night.  Reports she is stressed and has back pain from arthritis. Fell over trash can and has pain all over Frequency: 1-2 weeks  Pertinent Negatives: Patient denies chest pain no difficulty breathing no fever  Disposition: [] ED /[] Urgent Care (no appt availability in office) / [] Appointment(In office/virtual)/ []  [facility]/ [] Home Care/ [x] Refused Recommended Disposition /[] [facility]/ []  Follow-up with PCP Additional Notes:  Recommended UC and patient reports she can not afford $50 to go. Requesting cough medication due to tessalon  perles not effective. And steroids have helped in the past. Please advise . No available appt until Jan 20.        Summary: Cough, congestion, headache   The patient called in stating she is not feeling well with a cough and congestion and headache and under a lot of stress. Please assist patient further.         Reason for Disposition  Cough has been present for > 3 weeks  Answer Assessment - Initial Assessment Questions 1. ONSET: When did the cough begin?      1-2 weeks  2. SEVERITY: How bad is the cough today?      Cough causes sore throat  3. SPUTUM: Describe the color of your sputum (none, dry cough; clear, white, yellow, green)     Productive  4. HEMOPTYSIS: Are you coughing up any blood? If so ask: How much? (flecks, streaks, tablespoons, etc.)     na 5. DIFFICULTY BREATHING: Are you having difficulty breathing? If Yes, ask: How bad is it? (e.g., mild, moderate, severe)    - MILD: No SOB at rest, mild SOB with walking, speaks normally in sentences, can lie down, no retractions, pulse < 100.    - MODERATE: SOB at rest, SOB with minimal exertion and prefers to sit, cannot lie down flat, speaks in phrases, mild retractions, audible wheezing,  pulse 100-120.    - SEVERE: Very SOB at rest, speaks in single words, struggling to breathe, sitting hunched forward, retractions, pulse > 120      No  6. FEVER: Do you have a fever? If Yes, ask: What is your temperature, how was it measured, and when did it start?     na 7. CARDIAC HISTORY: Do you have any history of heart disease? (e.g., heart attack, congestive heart failure)      See hx  8. LUNG HISTORY: Do you have any history of lung disease?  (e.g., pulmonary embolus, asthma, emphysema)     Hx asthma  9. PE RISK FACTORS: Do you have a history of blood clots? (or: recent major surgery, recent prolonged travel, bedridden)     na 10. OTHER SYMPTOMS: Do you have any other symptoms? (e.g., runny nose, wheezing, chest pain)       Cough,  can not sleep,,  fell  over trash can and  reports pain all over   11. PREGNANCY: Is there any chance you are pregnant? When was your last menstrual period?       na 12. TRAVEL: Have you traveled out of the country in the last month? (e.g., travel history, exposures)       na  Protocols used: Cough - Acute Productive-A-AH

## 2023-03-06 NOTE — Telephone Encounter (Signed)
 Patient scheduled for Monday am with provider

## 2023-03-09 ENCOUNTER — Ambulatory Visit: Payer: Medicare HMO | Admitting: Physician Assistant

## 2023-03-09 ENCOUNTER — Encounter: Payer: Self-pay | Admitting: Physician Assistant

## 2023-03-09 ENCOUNTER — Ambulatory Visit (INDEPENDENT_AMBULATORY_CARE_PROVIDER_SITE_OTHER): Payer: Medicare HMO

## 2023-03-09 ENCOUNTER — Ambulatory Visit (INDEPENDENT_AMBULATORY_CARE_PROVIDER_SITE_OTHER): Payer: Medicare HMO | Admitting: Physician Assistant

## 2023-03-09 VITALS — BP 125/87 | HR 85 | Temp 98.5°F | Resp 16 | Wt 131.6 lb

## 2023-03-09 DIAGNOSIS — S7001XA Contusion of right hip, initial encounter: Secondary | ICD-10-CM

## 2023-03-09 DIAGNOSIS — M25551 Pain in right hip: Secondary | ICD-10-CM | POA: Diagnosis not present

## 2023-03-09 DIAGNOSIS — Z981 Arthrodesis status: Secondary | ICD-10-CM | POA: Diagnosis not present

## 2023-03-09 DIAGNOSIS — R0989 Other specified symptoms and signs involving the circulatory and respiratory systems: Secondary | ICD-10-CM

## 2023-03-09 MED ORDER — PROMETHAZINE-DM 6.25-15 MG/5ML PO SYRP
5.0000 mL | ORAL_SOLUTION | Freq: Four times a day (QID) | ORAL | 0 refills | Status: DC | PRN
Start: 2023-03-09 — End: 2023-07-01

## 2023-03-09 MED ORDER — PREDNISONE 10 MG PO TABS
ORAL_TABLET | ORAL | 0 refills | Status: DC
Start: 2023-03-09 — End: 2023-07-01

## 2023-03-09 MED ORDER — AZITHROMYCIN 250 MG PO TABS
ORAL_TABLET | ORAL | 0 refills | Status: DC
Start: 2023-03-09 — End: 2023-10-27

## 2023-03-09 NOTE — Progress Notes (Signed)
 Patient ID: [name], female   DOB: May 31, 1965, 58 y.o.   MRN: [id number]   [name], is a 58 y.o. female  [id number]  [id number]  DOB - [birth year]  Chief Complaint  Patient presents with   conjestion in chest and cough       Subjective:   [name] is a 58 y.o. female here today for cough and congestion for 2 weeks and now having brown and thick phlegm.  +wheezing.  She is using her inhalers.  No fever.  S/sx for 2 weeks.    Also fell onto R hip about one week ago. Still some pain but has not affected gait or ambulation.  She has ortho due to multiple back fx and surgeries  No problems updated.  ALLERGIES: Allergies  Allergen Reactions   Other    Effexor [Venlafaxine] Other (See Comments)    UNSPECIFIED REACTION, headaches, felt funny, withdrawal with missed dose    Lamotrigine Rash and Other (See Comments)    PAST MEDICAL HISTORY: Past Medical History:  Diagnosis Date   Anxiety    Arthritis    neck, knees, shoulders   Asthma    Bipolar disorder (HCC)    currently feeling MANIC- 10/02/2016   Depression    GERD (gastroesophageal reflux disease)    Hip fracture (HCC) 06/2019   History of blood transfusion    as a newborn    History of lump of left breast    Hyperlipidemia    Lumbar pseudoarthrosis    Motion sickness    cars   OSA (obstructive sleep apnea) 01/09/2016   can't afford CPAP   Personality disorder (HCC)    PONV (postoperative nausea and vomiting)    Post traumatic stress disorder (PTSD)    Substance abuse (HCC)    Synovial cyst     MEDICATIONS AT HOME: Prior to Admission medications   Medication Sig Start Date End Date Taking? Authorizing Provider  albuterol  (VENTOLIN  HFA) 108 (90 Base) MCG/ACT inhaler INHALE 2 PUFFS BY MOUTH EVERY 4 TO 6 HOURS AS NEEDED 06/10/22  Yes Padgett, Danita Macintosh, MD  aspirin 81 MG tablet Take 81 mg by mouth daily.   Yes [provider]  benzonatate  (TESSALON ) 100 MG capsule Take 1 capsule (100  mg total) by mouth 2 (two) times daily as needed for cough. 01/01/23  Yes Vicky Charleston, PA-C  buPROPion  (WELLBUTRIN  XL) 300 MG 24 hr tablet Take 1 tablet (300 mg total) by mouth daily. 12/24/22 06/22/23 Yes Marry Clamp, MD  Calcium  Carb-Cholecalciferol (OYSTER SHELL CALCIUM  W/D) 500-5 MG-MCG TABS TAKE 1 TABLET BY MOUTH THREE TIMES A DAY 01/29/21  Yes Jule Ronal CROME, PA-C  calcium -vitamin D  (OSCAL WITH D) 500-200 MG-UNIT tablet Take 1 tablet by mouth 3 (three) times daily. 07/19/19  Yes Jerri Kay HERO, MD  fluticasone  (FLONASE ) 50 MCG/ACT nasal spray Place 2 sprays into both nostrils daily as needed for allergies or rhinitis. 10/23/22  Yes Jeneal Danita Macintosh, MD  Fluticasone -Umeclidin-Vilant (TRELEGY ELLIPTA ) 200-62.5-25 MCG/ACT AEPB Inhale 1 puff into the lungs daily. 10/23/22  Yes Padgett, Danita Macintosh, MD  gabapentin  (NEURONTIN ) 300 MG capsule Take 1 capsule by mouth 3 (three) times daily. 02/25/17  Yes [provider]  HYDROcodone  bit-homatropine (HYCODAN) 5-1.5 MG/5ML syrup Take 5 mLs by mouth every 6 (six) hours as needed for cough. 08/12/22  Yes Lorren, Amy J, NP  ipratropium (ATROVENT ) 0.03 % nasal spray 2 sprays each nostril twice a day as needed for runny nose/drainage down throat. 10/23/22  Yes Jeneal Danita Macintosh, MD  levocetirizine (XYZAL ) 5 MG tablet TAKE 1 TABLET EVERY DAY AS NEEDED 10/23/22  Yes Padgett, Danita Macintosh, MD  mepolizumab  (NUCALA ) 100 MG/ML SOSY INJECT 100MG  SUBCUTANEOUSLY EVERY 4 WEEKS 04/24/22  Yes Jeneal Danita Macintosh, MD  montelukast  (SINGULAIR ) 10 MG tablet TAKE 1 TABLET BY MOUTH EVERYDAY AT BEDTIME 02/09/23  Yes Padgett, Danita Macintosh, MD  morphine (MSIR) 15 MG tablet Take 15 mg by mouth every 4 (four) hours as needed for severe pain.   Yes [provider]  pantoprazole  (PROTONIX ) 40 MG tablet Take 1 tablet (40 mg total) by mouth daily. 10/23/22  Yes Jeneal Danita Macintosh, MD  predniSONE  (DELTASONE ) 10 MG tablet  6,5,4,3,2,1 take each days dose all at once with food 03/09/23  Yes Monette Omara M, PA-C  promethazine -dextromethorphan (PROMETHAZINE -DM) 6.25-15 MG/5ML syrup Take 5 mLs by mouth 4 (four) times daily as needed for cough. 03/09/23  Yes Ellison Leisure, Jon HERO, PA-C  simvastatin  (ZOCOR ) 20 MG tablet TAKE 1 TABLET BY MOUTH EVERY DAY AT [facility] 02/10/23  Yes Newlin, Enobong, MD  SOMA  350 MG tablet take 1 tablet by oral route every 6 hours as needed 12/31/22  Yes [provider]  triamcinolone  (NASACORT ) 55 MCG/ACT AERO nasal inhaler  12/01/19  Yes [provider]  zonisamide  (ZONEGRAN ) 100 MG capsule Take 1 capsule (100 mg total) by mouth at bedtime. 12/24/22 06/22/23 Yes Marry Clamp, MD  azithromycin  (ZITHROMAX ) 250 MG tablet Take 2 tabs today, then take 1 tab daily until gone. 03/09/23   Danton Jon HERO, PA-C  cyclobenzaprine  (FLEXERIL ) 10 MG tablet Take 10 mg by mouth 2 (two) times daily as needed. Patient not taking: Reported on 03/09/2023 02/06/22   [provider]  promethazine  (PHENERGAN ) 25 MG tablet Take 1 tablet (25 mg total) by mouth every 8 (eight) hours as needed for nausea or vomiting. Patient not taking: Reported on 03/09/2023 02/03/22   Tanda Bleacher, MD    ROS: Neg cardiac Neg GI Neg GU Neg psych Neg neuro  Objective:   Vitals:   03/09/23 1523 03/09/23 1529  BP: (!) 151/83 125/87  Pulse: 85   Resp: 16   Temp: 98.5 F (36.9 C)   TempSrc: Oral   Weight: 131 lb 9.6 oz (59.7 kg)    Exam General appearance : Awake, alert, not in any distress. Speech Clear. Not toxic looking HEENT: Atraumatic and Normocephalic, pupils equally reactive to light and accomodation Neck: Supple, no JVD. No cervical lymphadenopathy.  Chest: Fair air entry bilaterally.  No rales/rhonchi.  There is diffuse and moderate wheezing throughout CVS: S1 S2 regular, no murmurs.  Extremities: B/L Lower Ext shows no edema, both legs are warm to touch Neurology: Awake alert, and oriented X 3,  CN II-XII intact, Non focal Skin: No Rash  Data Review Lab Results  Component Value Date   HGBA1C 5.6 09/17/2021   HGBA1C 5.6 06/22/2018    Assessment & Plan   1. Upper respiratory symptom (Primary) Will cover for atypicals - predniSONE  (DELTASONE ) 10 MG tablet; 6,5,4,3,2,1 take each days dose all at once with food  Dispense: 21 tablet; Refill: 0 - azithromycin  (ZITHROMAX ) 250 MG tablet; Take 2 tabs today, then take 1 tab daily until gone.  Dispense: 6 tablet; Refill: 0 - promethazine -dextromethorphan (PROMETHAZINE -DM) 6.25-15 MG/5ML syrup; Take 5 mLs by mouth 4 (four) times daily as needed for cough.  Dispense: 118 mL; Refill: 0  2. Contusion of right hip, initial encounter - predniSONE  (DELTASONE ) 10 MG tablet; 6,5,4,3,2,1 take  each days dose all at once with food  Dispense: 21 tablet; Refill: 0 - DG Hip Unilat W OR W/O Pelvis 2-3 Views Right; Future    Return in about 3 months (around 06/07/2023) for PCP for chronic conditions.  The patient was given clear instructions to go to ER or return to medical center if symptoms don't improve, worsen or new problems develop. The patient verbalized understanding. The patient was told to call to get lab results if they haven't heard anything in the next week.      Jon Moores, PA-C [facility] [place], [place] [phone]   03/09/2023, 3:52 PM

## 2023-03-16 ENCOUNTER — Encounter: Payer: Self-pay | Admitting: Physician Assistant

## 2023-03-16 ENCOUNTER — Telehealth: Payer: Self-pay

## 2023-03-16 NOTE — Telephone Encounter (Signed)
 Patient viewed results and Doctor comment through [facility]

## 2023-03-16 NOTE — Telephone Encounter (Signed)
-----   Message from Jon Moores sent at 03/16/2023 12:58 PM EST ----- Xrays of R hip show mild arthritis in the hip and sacroiliac joints.  There were no acute changes form fall.  Continue in physical therapy for your back and stretch regularly.  Your orthopedist may have other recommendations.  Thanks, Jon Moores, PA-C

## 2023-03-20 ENCOUNTER — Ambulatory Visit: Payer: Medicare HMO

## 2023-03-24 DIAGNOSIS — C44729 Squamous cell carcinoma of skin of left lower limb, including hip: Secondary | ICD-10-CM | POA: Diagnosis not present

## 2023-03-25 ENCOUNTER — Ambulatory Visit: Payer: Medicare HMO

## 2023-03-31 ENCOUNTER — Ambulatory Visit (INDEPENDENT_AMBULATORY_CARE_PROVIDER_SITE_OTHER): Payer: Medicare HMO | Admitting: *Deleted

## 2023-03-31 DIAGNOSIS — J4550 Severe persistent asthma, uncomplicated: Secondary | ICD-10-CM

## 2023-04-06 ENCOUNTER — Ambulatory Visit (HOSPITAL_BASED_OUTPATIENT_CLINIC_OR_DEPARTMENT_OTHER): Payer: Medicare HMO | Admitting: Student

## 2023-04-06 VITALS — BP 140/83 | HR 80 | Ht 60.0 in | Wt 132.0 lb

## 2023-04-06 DIAGNOSIS — F3175 Bipolar disorder, in partial remission, most recent episode depressed: Secondary | ICD-10-CM

## 2023-04-06 DIAGNOSIS — F1021 Alcohol dependence, in remission: Secondary | ICD-10-CM

## 2023-04-06 DIAGNOSIS — F3170 Bipolar disorder, currently in remission, most recent episode unspecified: Secondary | ICD-10-CM

## 2023-04-06 DIAGNOSIS — Z79899 Other long term (current) drug therapy: Secondary | ICD-10-CM

## 2023-04-06 MED ORDER — ZONISAMIDE 100 MG PO CAPS
100.0000 mg | ORAL_CAPSULE | Freq: Every day | ORAL | 3 refills | Status: DC
Start: 2023-04-06 — End: 2023-06-08

## 2023-04-06 MED ORDER — BUPROPION HCL ER (XL) 300 MG PO TB24
300.0000 mg | ORAL_TABLET | Freq: Every day | ORAL | 3 refills | Status: DC
Start: 2023-04-06 — End: 2023-06-08

## 2023-04-07 DIAGNOSIS — F3170 Bipolar disorder, currently in remission, most recent episode unspecified: Secondary | ICD-10-CM | POA: Insufficient documentation

## 2023-04-07 NOTE — Progress Notes (Cosign Needed Addendum)
BH MD Outpatient Progress Note  04/07/2023 4:20 PM [name]  MRN:  [id number]  Assessment:  [name] presents for follow-up evaluation.  Today the patient reports that she is doing fairly well.  However, she reports that this time of the year is difficult because she lost both her parents around 1 year ago.  Patient denies mood cycling symptoms.  The patient continues to decline any changes to her medication regimen.   Identifying Information: [name] is a 58 y.o. y.o. 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   Plan:  # Bipolar affective disorder in remission, currently depressed Interventions: -- Continue Wellbutrin 300 mg XL - Continue zonisamide 100 mg nightly - Ideally the patient would be on a better mood stabilizing medication, however, she has been resistant to this and appears stable - The patient is taking MS Contin 15 mg twice daily as well as gabapentin 300 mg twice daily and Flexeril 10 mg twice daily, prescribed by another physician. - It appears the patient recently restarted Soma to help with pain  # Cannabis use disorder, moderate, dependence Interventions: -- Continue to encourage reduction use or abstinence  Patient was given contact information for behavioral health clinic and was instructed to call 911 for emergencies.   Subjective:  Chief Complaint:  Chief Complaint  Patient presents with   Follow-up    Interval History:  The patient is tearful during much of the visit and talks about the loss of her parents.  She reports that she had to move out of her parents home a few months ago and that this was difficult for her.  She continues to report chronically poor sleep.  She  reports her appetite is fair.  She denies feeling hopeless or having any form of suicidal thoughts.  She reports that her marijuana use is unchanged, smoking the substance approximately 2 times per day.  She states that she has no interest in decreasing her use.  The patient reports that she has been working with an Pensions consultant and is trying to get previous legal charges expunged.  I discussed with her the only documentation I can provide for her is a letter stating her present diagnoses and that she follows for psychiatric care at this clinic.  She was agreeable to this.  Visit Diagnosis:    ICD-10-CM   1. Bipolar disorder in remission (HCC)  F31.70 buPROPion (WELLBUTRIN XL) 300 MG 24 hr tablet    zonisamide (ZONEGRAN) 100 MG capsule    2. Encounter for long-term (current) use of medications  Z79.899     3. Alcohol use disorder, moderate, in sustained remission (HCC)  F10.21       Past Psychiatric History: Last behavioral health hospitalization in 2006, which appears to have been voluntary in nature.  The patient reports that she has not attempted suicide in several decades  Past Medical History:  Past Medical History:  Diagnosis Date   Anxiety    Arthritis    neck, knees, shoulders   Asthma    Bipolar disorder (HCC)    currently feeling MANIC- 10/02/2016   Depression    GERD (gastroesophageal reflux disease)    Hip fracture (HCC) 06/2019   History of blood transfusion  as a newborn    History of lump of left breast    Hyperlipidemia    Lumbar pseudoarthrosis    Motion sickness    cars   OSA (obstructive sleep apnea) 01/09/2016   can't afford CPAP   Personality disorder (HCC)    PONV (postoperative nausea and vomiting)    Post traumatic stress disorder (PTSD)    Substance abuse (HCC)    Synovial cyst     Past Surgical History:  Procedure Laterality Date   COLONOSCOPY WITH PROPOFOL N/A 11/04/2017   Procedure: COLONOSCOPY WITH PROPOFOL;  Surgeon: Toney Reil, MD;   Location: [facility];  Service: Endoscopy;  Laterality: N/A;   ESOPHAGOGASTRODUODENOSCOPY (EGD) WITH PROPOFOL N/A 11/04/2017   Procedure: ESOPHAGOGASTRODUODENOSCOPY (EGD) WITH PROPOFOL with biopsies;  Surgeon: Toney Reil, MD;  Location: [facility];  Service: Endoscopy;  Laterality: N/A;  sleep apnea   KNEE SURGERY Left    x5, post basketball injury   LUMBAR LAMINECTOMY/DECOMPRESSION MICRODISCECTOMY Left 01/22/2016   Procedure: Laminectomy for facet/synovial cyst - left - Lumbar four - lumbar five;  Surgeon: Julio Sicks, MD;  Location: [facility] OR;  Service: Neurosurgery;  Laterality: Left;  Laminectomy for facet/synovial cyst - left - Lumbar four - lumbar five   POLYPECTOMY N/A 11/04/2017   Procedure: POLYPECTOMY INTESTINAL;  Surgeon: Toney Reil, MD;  Location: [facility];  Service: Endoscopy;  Laterality: N/A;   SHOULDER SURGERY Left    x2   SPINE SURGERY N/A    [employer]reesia 08/07/2019   TOE SURGERY Bilateral    bone spurs    Family Psychiatric History: None pertinent  Family History:  Family History  Problem Relation Age of Onset   Heart disease Father    Hyperlipidemia Father    Alcohol abuse Brother    Alcohol abuse Paternal Uncle    Breast cancer Maternal Aunt        70's   Colon cancer Neg Hx     Social History:  Social History   Socioeconomic History   Marital status: Single    Spouse name: Not on file   Number of children: 0   Years of education: Not on file   Highest education level: Bachelor's degree (e.g., BA, AB, BS)  Occupational History   Not on file  Tobacco Use   Smoking status: Former    Current packs/day: 0.00    Average packs/day: 1 pack/day for 1 year (1.0 ttl pk-yrs)    Types: Cigarettes    Start date: 04/16/1995    Quit date: 04/15/1996    Years since quitting: 26.9    Passive exposure: Past   Smokeless tobacco: Never   Tobacco comments:    started back again in 2016, smoked for about 6 mo and then quit  Vaping  Use   Vaping status: Never Used  Substance and Sexual Activity   Alcohol use: No    Alcohol/week: 0.0 standard drinks of alcohol    Comment: quit 20 years   Drug use: Yes    Frequency: 14.0 times per week    Types: Marijuana    Comment: 20 yrs. ago- cocaine    Sexual activity: Not Currently  Other Topics Concern   Not on file  Social History Narrative   Not on file   Social Drivers of Health   Financial Resource Strain: Medium Risk (07/01/2022)   Overall Financial Resource Strain (CARDIA)    Difficulty of Paying Living Expenses: Somewhat hard  Food Insecurity: No Food Insecurity (  07/01/2022)   Hunger Vital Sign    Worried About Running Out of Food in the Last Year: Never true    Ran Out of Food in the Last Year: Never true  Transportation Needs: No Transportation Needs (07/01/2022)   PRAPARE - Administrator, Civil Service (Medical): No    Lack of Transportation (Non-Medical): No  Physical Activity: Insufficiently Active (03/09/2023)   Exercise Vital Sign    Days of Exercise per Week: 4 days    Minutes of Exercise per Session: 30 min  Stress: Patient Declined (07/01/2022)   [employer] of Occupational Health - Occupational Stress Questionnaire    Feeling of Stress : Patient declined  Social Connections: Moderately Isolated (07/01/2022)   Social Connection and Isolation Panel [NHANES]    Frequency of Communication with Friends and Family: More than three times a week    Frequency of Social Gatherings with Friends and Family: More than three times a week    Attends Religious Services: 1 to 4 times per year    Active Member of [employer] or Organizations: No    Attends Banker Meetings: Not on file    Marital Status: Never married    Allergies:  Allergies  Allergen Reactions   Other    Effexor [Venlafaxine] Other (See Comments)    UNSPECIFIED REACTION, headaches, felt funny, withdrawal with missed dose    Lamotrigine Rash and Other (See Comments)     Current Medications: Current Outpatient Medications  Medication Sig Dispense Refill   albuterol (VENTOLIN HFA) 108 (90 Base) MCG/ACT inhaler INHALE 2 PUFFS BY MOUTH EVERY 4 TO 6 HOURS AS NEEDED 18 each 1   aspirin 81 MG tablet Take 81 mg by mouth daily.     azithromycin (ZITHROMAX) 250 MG tablet Take 2 tabs today, then take 1 tab daily until gone. 6 tablet 0   benzonatate (TESSALON) 100 MG capsule Take 1 capsule (100 mg total) by mouth 2 (two) times daily as needed for cough. 20 capsule 0   buPROPion (WELLBUTRIN XL) 300 MG 24 hr tablet Take 1 tablet (300 mg total) by mouth daily. 30 tablet 3   Calcium Carb-Cholecalciferol (OYSTER SHELL CALCIUM W/D) 500-5 MG-MCG TABS TAKE 1 TABLET BY MOUTH THREE TIMES A DAY 90 tablet 6   calcium-vitamin D (OSCAL WITH D) 500-200 MG-UNIT tablet Take 1 tablet by mouth 3 (three) times daily. 90 tablet 6   cyclobenzaprine (FLEXERIL) 10 MG tablet Take 10 mg by mouth 2 (two) times daily as needed. (Patient not taking: Reported on 03/09/2023)     fluticasone (FLONASE) 50 MCG/ACT nasal spray Place 2 sprays into both nostrils daily as needed for allergies or rhinitis. 16 g 5   Fluticasone-Umeclidin-Vilant (TRELEGY ELLIPTA) 200-62.5-25 MCG/ACT AEPB Inhale 1 puff into the lungs daily. 60 each 5   gabapentin (NEURONTIN) 300 MG capsule Take 1 capsule by mouth 3 (three) times daily.     HYDROcodone bit-homatropine (HYCODAN) 5-1.5 MG/5ML syrup Take 5 mLs by mouth every 6 (six) hours as needed for cough. 120 mL 0   ipratropium (ATROVENT) 0.03 % nasal spray 2 sprays each nostril twice a day as needed for runny nose/drainage down throat. 30 mL 5   levocetirizine (XYZAL) 5 MG tablet TAKE 1 TABLET EVERY DAY AS NEEDED 30 tablet 5   mepolizumab (NUCALA) 100 MG/ML SOSY INJECT 100MG  SUBCUTANEOUSLY EVERY 4 WEEKS 1 mL 11   montelukast (SINGULAIR) 10 MG tablet TAKE 1 TABLET BY MOUTH EVERYDAY AT BEDTIME 30 tablet 0  morphine (MSIR) 15 MG tablet Take 15 mg by mouth every 4 (four) hours as  needed for severe pain.     pantoprazole (PROTONIX) 40 MG tablet Take 1 tablet (40 mg total) by mouth daily. 30 tablet 5   predniSONE (DELTASONE) 10 MG tablet 6,5,4,3,2,1 take each days dose all at once with food 21 tablet 0   promethazine (PHENERGAN) 25 MG tablet Take 1 tablet (25 mg total) by mouth every 8 (eight) hours as needed for nausea or vomiting. (Patient not taking: Reported on 03/09/2023) 20 tablet 0   promethazine-dextromethorphan (PROMETHAZINE-DM) 6.25-15 MG/5ML syrup Take 5 mLs by mouth 4 (four) times daily as needed for cough. 118 mL 0   simvastatin (ZOCOR) 20 MG tablet TAKE 1 TABLET BY MOUTH EVERY DAY AT 6PM 90 tablet 1   SOMA 350 MG tablet take 1 tablet by oral route every 6 hours as needed     triamcinolone (NASACORT) 55 MCG/ACT AERO nasal inhaler      zonisamide (ZONEGRAN) 100 MG capsule Take 1 capsule (100 mg total) by mouth at bedtime. 30 capsule 3   Current Facility-Administered Medications  Medication Dose Route Frequency Provider Last Rate Last Admin   mepolizumab (NUCALA) injection 100 mg  100 mg Subcutaneous Q28 days Marcelyn Bruins, MD   100 mg at 03/31/23 1523     Objective:  Psychiatric Specialty Exam: Physical Exam Constitutional:      Appearance: the patient is not toxic-appearing.  Pulmonary:     Effort: Pulmonary effort is normal.  Neurological:     General: No focal deficit present.     Mental Status: the patient is alert and oriented to person, place, and time.   Review of Systems  Respiratory:  Negative for shortness of breath.   Cardiovascular:  Negative for chest pain.  Gastrointestinal:  Negative for abdominal pain, constipation, diarrhea, nausea and vomiting.  Neurological:  Negative for headaches.      BP (!) 140/83   Pulse 80   Ht 5' (1.524 m)   Wt 132 lb (59.9 kg)   BMI 25.78 kg/m   General Appearance: Fairly Groomed  Eye Contact:  Good  Speech:  Clear and Coherent  Volume:  Normal  Mood: "Fine"  Affect:  Congruent,  somewhat depressed, able to laugh appropriately  Thought Process:  Coherent  Orientation:  Full (Time, Place, and Person)  Thought Content: Logical   Suicidal Thoughts:  No  Homicidal Thoughts:  No  Memory:  Immediate;   Good  Judgement:  fair  Insight:  fair  Psychomotor Activity:  Normal  Concentration:  Concentration: Good  Recall:  Good  Fund of Knowledge: Good  Language: Good  Akathisia:  No  Handed:    AIMS (if indicated): not done  Assets:  Communication Skills Desire for Improvement Financial Resources/Insurance Housing Leisure Time Physical Health  ADL's:  Intact  Cognition: WNL  Sleep: Poor     Metabolic Disorder Labs: Lab Results  Component Value Date   HGBA1C 5.6 09/17/2021   No results found for: "PROLACTIN" Lab Results  Component Value Date   CHOL 161 09/30/2022   TRIG 91 09/30/2022   HDL 74 09/30/2022   CHOLHDL 2.2 09/30/2022   VLDL 95.6 06/22/2018   LDLCALC 70 09/30/2022   LDLCALC 90 09/17/2021   Lab Results  Component Value Date   TSH 2.380 01/08/2023   TSH 1.480 09/17/2021    Therapeutic Level Labs: No results found for: "LITHIUM" No results found for: "VALPROATE" No results found  for: "CBMZ"  Screenings: GAD-7    Flowsheet Row Office Visit from 01/08/2023 in [facility] at [facility] Visit from 07/15/2022 in [facility] at [facility] Office Visit from 07/01/2022 in [facility] at [facility]  Total GAD-7 Score 6 7 0      Mini-Mental    Flowsheet Row Office Visit from 04/17/2020 in [facility] at [facility]  Total Score (max 30 points ) 30      PHQ2-9    Flowsheet Row Office Visit from 03/09/2023 in [facility] at [facility] Office Visit from 01/08/2023 in [facility] Care at [facility] Office Visit from 09/30/2022 in [facility] Care at [facility] Office Visit from 07/15/2022 in [facility] Primary Care at [facility]  Office Visit from 07/01/2022 in [facility] Primary Care at [facility]  PHQ-2 Total Score 1 4 0 2 0  PHQ-9 Total Score 4 9 7 8  0      Flowsheet Row ED from 11/03/2022 in [facility] at [facility]) ED from 09/22/2022 in [facility] at [facility]) ED from 05/14/2022 in [facility] Care at [facility])  C-SSRS RISK CATEGORY No Risk No Risk No Risk       Collaboration of Care: none  A total of 30 minutes was spent involved in face to face clinical care, chart review, documentation.   Carlyn Reichert, MD 04/07/2023, 4:20 PM

## 2023-04-09 ENCOUNTER — Other Ambulatory Visit: Payer: Self-pay | Admitting: *Deleted

## 2023-04-09 DIAGNOSIS — J4550 Severe persistent asthma, uncomplicated: Secondary | ICD-10-CM

## 2023-04-09 MED ORDER — NUCALA 100 MG/ML ~~LOC~~ SOSY
100.0000 mg | PREFILLED_SYRINGE | SUBCUTANEOUS | 11 refills | Status: AC
Start: 2023-04-09 — End: ?

## 2023-04-21 ENCOUNTER — Ambulatory Visit: Payer: Self-pay | Admitting: Family Medicine

## 2023-04-21 ENCOUNTER — Emergency Department: Payer: Medicare HMO

## 2023-04-21 ENCOUNTER — Other Ambulatory Visit: Payer: Self-pay

## 2023-04-21 ENCOUNTER — Telehealth: Payer: Self-pay | Admitting: Emergency Medicine

## 2023-04-21 ENCOUNTER — Emergency Department
Admission: EM | Admit: 2023-04-21 | Discharge: 2023-04-21 | Disposition: A | Discharge status: Home / Self Care | Payer: Medicare HMO | Attending: Student in an Organized Health Care Education/Training Program | Admitting: Student in an Organized Health Care Education/Training Program

## 2023-04-21 DIAGNOSIS — X58XXXA Exposure to other specified factors, initial encounter: Secondary | ICD-10-CM | POA: Insufficient documentation

## 2023-04-21 DIAGNOSIS — R0781 Pleurodynia: Secondary | ICD-10-CM | POA: Insufficient documentation

## 2023-04-21 LAB — CBC WITH DIFFERENTIAL/PLATELET
Abs Immature Granulocytes: 0.02 10*3/uL (ref 0.00–0.07)
Basophils Absolute: 0 10*3/uL (ref 0.0–0.1)
Basophils Relative: 0 %
Eosinophils Absolute: 0 10*3/uL (ref 0.0–0.5)
Eosinophils Relative: 0 %
HCT: 39.8 % (ref 36.0–46.0)
Hemoglobin: 13.6 g/dL (ref 12.0–15.0)
Immature Granulocytes: 0 %
Lymphocytes Relative: 27 %
Lymphs Abs: 2.1 10*3/uL (ref 0.7–4.0)
MCH: 31.1 pg (ref 26.0–34.0)
MCHC: 34.2 g/dL (ref 30.0–36.0)
MCV: 90.9 fL (ref 80.0–100.0)
Monocytes Absolute: 0.4 10*3/uL (ref 0.1–1.0)
Monocytes Relative: 6 %
Neutro Abs: 5.1 10*3/uL (ref 1.7–7.7)
Neutrophils Relative %: 67 %
Platelets: 351 10*3/uL (ref 150–400)
RBC: 4.38 MIL/uL (ref 3.87–5.11)
RDW: 13.9 % (ref 11.5–15.5)
WBC: 7.7 10*3/uL (ref 4.0–10.5)
nRBC: 0 % (ref 0.0–0.2)

## 2023-04-21 LAB — BASIC METABOLIC PANEL
Anion gap: 9 (ref 5–15)
BUN: 12 mg/dL (ref 6–20)
CO2: 26 mmol/L (ref 22–32)
Calcium: 9.4 mg/dL (ref 8.9–10.3)
Chloride: 101 mmol/L (ref 98–111)
Creatinine, Ser: 0.91 mg/dL (ref 0.44–1.00)
GFR, Estimated: >60 mL/min (ref >60)
Glucose, Bld: 93 mg/dL (ref 70–99)
Potassium: 4.2 mmol/L (ref 3.5–5.1)
Sodium: 136 mmol/L (ref 135–145)

## 2023-04-21 LAB — TROPONIN I (HIGH SENSITIVITY): Troponin I (High Sensitivity): 5 ng/L (ref <18)

## 2023-04-21 MED ORDER — SOMA 350 MG PO TABS: 350.0000 mg | ORAL_TABLET | Freq: Three times a day (TID) | ORAL | 0 refills | Status: DC | PRN

## 2023-04-21 MED ORDER — SOMA 350 MG PO TABS: 350.0000 mg | ORAL_TABLET | Freq: Three times a day (TID) | ORAL | 0 refills | Status: AC | PRN

## 2023-04-21 MED ORDER — LIDOCAINE 5 % EX PTCH: 1.0000 | MEDICATED_PATCH | Freq: Two times a day (BID) | CUTANEOUS | 0 refills | Status: AC

## 2023-04-21 MED ORDER — LIDOCAINE 5 % EX PTCH
1.0000 | MEDICATED_PATCH | CUTANEOUS | Status: DC
  Administered 2023-04-21: 1 via TRANSDERMAL
  Filled 2023-04-21: qty 1

## 2023-04-21 NOTE — Telephone Encounter (Signed)
  Chief Complaint: R chest pain Symptoms: pain Frequency: approx 1 week Pertinent Negatives: Patient denies SOB, bruising Disposition: [x] ED /[] Urgent Care (no appt availability in office) / [] Appointment(In office/virtual)/ []  [place] [facility]/ [] Home Care/ [x] Refused Recommended Disposition /[] [place] [facility]/ []  Follow-up with PCP Additional Notes: Patient calls reporting R side chest pain that moves from bottom of rib cage up to shoulder after turning wrong while hanging something in the closet last week. Patient states pain is 10/10 and she is concerned she may have broken ribs. Per protocol, patient to present to ED now for evaluation based of symptoms. Care advice reviewed, patient refuses dispo requesting to be seen by PCP. Contacted [facility], spoke with [place] regarding refusal of dispo. Alerting PCP for review and follow up.    Copied from CRM [id number]. Topic: Clinical - Red Word Triage >> Apr 21, 2023  1:18 PM Higinio Roger wrote: Patient is in severe pain. Has chest pain and thinks she might have pulled a muscle. She was hanging up a closet space saver and must have turned wrong. She also stated she might have cracked a rib and would like to walk in to get an xray. Reason for Disposition  SEVERE chest pain  Answer Assessment - Initial Assessment Questions 1. MECHANISM: "How did the injury happen?"     Was hanging something and turned wrong, felt like she pulled a muscle. 2. ONSET: "When did the injury happen?" (Minutes or hours ago)     Approx 1 week ago 3. LOCATION: "Where on the chest is the injury located?"     R side of chest 4. APPEARANCE: "What does the injury look like?"     Has not looked 5. BLEEDING: "Is there any bleeding now? If Yes, ask: How long has it been bleeding?"     Denies 6. SEVERITY: "Any difficulty with breathing?"     Denies 7. SIZE: For cuts, bruises, or swelling, ask: "How large is it?" (e.g., inches or centimeters)     Denies 8. PAIN: "Is  there pain?" If Yes, ask: "How bad is the pain?"   (e.g., Scale 1-10; or mild, moderate, severe)     Takes morphine for back pain and this is 10/10. 9. TETANUS: For any breaks in the skin, ask: "When was the last tetanus booster?"     UTD  Protocols used: Chest Injury-A-AH

## 2023-04-21 NOTE — Telephone Encounter (Signed)
I called patient because E2C2 called ands said she refused ED Deposition so I called to see what is is symptoms she was having, however no one answer so I left a voicemail for her to call back.

## 2023-04-21 NOTE — ED Provider Notes (Signed)
[facility] Provider Note    Event Date/Time   First MD Initiated Contact with Patient 04/21/23 1909     (approximate)   History   Muscle Pain and Rib Injury   HPI  [name] is a 58 y.o. female with PMH of bipolar disorder, substance abuse, chronic pain syndrome who presents for evaluation of right rib pain.  Patient states she was working in her closet when she thinks she pulled something in her ribs.  She is concerned that she may have broken something.  This happened about a week ago and the pain is increasing so she came to the ER.  She describes the pain as being in her right lower ribs more than in her chest.  Worse with certain movements.      Physical Exam   Triage Vital Signs: ED Triage Vitals  Encounter Vitals Group     BP 04/21/23 1638 (!) 180/102     Systolic BP Percentile --      Diastolic BP Percentile --      Pulse Rate 04/21/23 1638 85     Resp 04/21/23 1638 18     Temp 04/21/23 1638 98.6 F (37 C)     Temp Source 04/21/23 1638 Oral     SpO2 04/21/23 1638 98 %     Weight 04/21/23 1638 135 lb (61.2 kg)     Height 04/21/23 1638 5' (1.524 m)     Head Circumference --      Peak Flow --      Pain Score 04/21/23 1637 10     Pain Loc --      Pain Education --      Exclude from Growth Chart --     Most recent vital signs: Vitals:   04/21/23 1638  BP: (!) 180/102  Pulse: 85  Resp: 18  Temp: 98.6 F (37 C)  SpO2: 98%   General: Awake, occasional distress with specific movements. CV:  Good peripheral perfusion.  RRR. Resp:  Normal effort.  CTAB. Abd:  No distention.  Other:  Tender to palpation over the right ribs.   ED Results / Procedures / Treatments   Labs (all labs ordered are listed, but only abnormal results are displayed) Labs Reviewed  BASIC METABOLIC PANEL  CBC WITH DIFFERENTIAL/PLATELET  TROPONIN I (HIGH SENSITIVITY)  TROPONIN I (HIGH SENSITIVITY)   EKG  ED provider interpretation: Normal sinus  rhythm.  Vent. rate 66 BPM PR interval 130 ms QRS duration 84 ms QT/QTcB 366/383 ms P-R-T axes 68 70 63  RADIOLOGY  Right rib x-ray obtained, interpreted the images as well as reviewed the radiologist report which was negative for any fractures or pulmonary abnormalities.  PROCEDURES:  Critical Care performed: No  Procedures   MEDICATIONS ORDERED IN ED: Medications  lidocaine (LIDODERM) 5 % 1 patch (1 patch Transdermal Patch Applied 04/21/23 2018)     IMPRESSION / MDM / ASSESSMENT AND PLAN / ED COURSE  I reviewed the triage vital signs and the nursing notes.                             58 year old female presents for evaluation of right rib pain.  Patient is hypertensive in triage but does states she is in significant pain.  Vital signs stable otherwise.  Differential diagnosis includes, but is not limited to, muscle strain, rib fracture, rib dislocation, ACS, pneumonia, GERD.  Patient's presentation is most  consistent with acute complicated illness / injury requiring diagnostic workup.  CBC, CMP and troponin all unremarkable.  EKG shows normal sinus rhythm.  Chest x-ray negative for rib fracture and other acute cardiopulmonary pathology.  Patient is quite tender over the right ribs.  Given the reassuring workup I believe her pain is due to a muscle strain.  She is given a lidocaine patch while in the ED.  Will also send her home with a prescription for some.  The pharmacy she normally gets her muscle relaxer filled at is out of the medication so I will give her a few days worth of this.  We discussed symptomatic management using over-the-counter pain medications.  She can continue to take the morphine that was previously prescribed to her as needed.  She voiced understanding, all questions were answered and she was stable at discharge.    FINAL CLINICAL IMPRESSION(S) / ED DIAGNOSES   Final diagnoses:  Rib pain on right side     Rx / DC Orders   ED Discharge Orders           Ordered    SOMA 350 MG tablet  3 times daily PRN,   Status:  Discontinued        04/21/23 2014    lidocaine (LIDODERM) 5 %  Every 12 hours        04/21/23 2015    SOMA 350 MG tablet  3 times daily PRN        04/21/23 2020             Note:  This document was prepared using Dragon voice recognition software and may include unintentional dictation errors.   Cameron Ali, PA-C 04/21/23 2023    Willy Eddy, MD 04/21/23 2217

## 2023-04-21 NOTE — ED Provider Triage Note (Signed)
Emergency Medicine Provider Triage Evaluation Note  [name] , a 58 y.o. female  was evaluated in triage.  Pt complains of chest pain in the ribs. This started a week ago, thinks she pulled something in her chest. Tried to reach out to her PCP to get an xray and was advised to come to the ED.  Patient has chronic pain, takes morphine and gabapentin for this.  Review of Systems  Positive: CP Negative:   Physical Exam  There were no vitals taken for this visit. Gen:   Awake, no distress   Resp:  Normal effort  MSK:   Moves extremities without difficulty  Other:    Medical Decision Making  Medically screening exam initiated at 4:34 PM.  Appropriate orders placed.  [name] was informed that the remainder of the evaluation will be completed by another provider, this initial triage assessment does not replace that evaluation, and the importance of remaining in the ED until their evaluation is complete.     Cameron Ali, PA-C 04/21/23 1637

## 2023-04-21 NOTE — Discharge Instructions (Addendum)
Your blood work was normal today.  Chest x-ray did not show any fractures or dislocations.  EKG was normal.  I believe your pain is caused by a muscle strain.  I have attached written prescriptions for the muscle relaxer as well as lidocaine patches.  You can continue to take Tylenol and ibuprofen as well as your morphine as needed for pain.  I have attached information about rib pain for you to review. Return to the ED with any worsening symptoms.

## 2023-04-21 NOTE — ED Triage Notes (Signed)
Pt to ED for pain to R rib area after she was hanging things in her closet 1 week ago and turned the wrong way. States pain is worse with certain movements. Pain has been increasing, thinks may have fractured a rib.   Pt also has chronic pain, takes 15 mg morphine BID and gabapentin BID. Also asking for Risperdal refill.

## 2023-04-23 ENCOUNTER — Ambulatory Visit: Payer: Medicare HMO | Admitting: Allergy

## 2023-04-28 ENCOUNTER — Ambulatory Visit: Payer: Medicare HMO

## 2023-04-28 ENCOUNTER — Ambulatory Visit: Payer: Medicare HMO | Admitting: Allergy & Immunology

## 2023-05-21 ENCOUNTER — Telehealth: Payer: Self-pay | Admitting: Emergency Medicine

## 2023-05-21 NOTE — Telephone Encounter (Signed)
 Patient called back to see if she was update on vaccine and I made here awarev that she was per other CMA.

## 2023-05-25 ENCOUNTER — Other Ambulatory Visit: Payer: Self-pay | Admitting: Allergy

## 2023-05-27 DIAGNOSIS — S32010A Wedge compression fracture of first lumbar vertebra, initial encounter for closed fracture: Secondary | ICD-10-CM | POA: Diagnosis not present

## 2023-05-27 DIAGNOSIS — M4317 Spondylolisthesis, lumbosacral region: Secondary | ICD-10-CM | POA: Diagnosis not present

## 2023-06-08 ENCOUNTER — Ambulatory Visit (HOSPITAL_COMMUNITY): Payer: Medicare HMO | Admitting: Student

## 2023-06-08 ENCOUNTER — Encounter (HOSPITAL_COMMUNITY): Payer: Self-pay | Admitting: Student

## 2023-06-08 VITALS — BP 91/64 | HR 78 | Wt 133.0 lb

## 2023-06-08 DIAGNOSIS — F3170 Bipolar disorder, currently in remission, most recent episode unspecified: Secondary | ICD-10-CM | POA: Diagnosis not present

## 2023-06-08 DIAGNOSIS — Z79899 Other long term (current) drug therapy: Secondary | ICD-10-CM | POA: Diagnosis not present

## 2023-06-08 DIAGNOSIS — F1220 Cannabis dependence, uncomplicated: Secondary | ICD-10-CM

## 2023-06-08 MED ORDER — BUPROPION HCL ER (XL) 300 MG PO TB24: 300.0000 mg | ORAL_TABLET | Freq: Every day | ORAL | 3 refills | Status: DC

## 2023-06-08 MED ORDER — ZONISAMIDE 100 MG PO CAPS: 100.0000 mg | ORAL_CAPSULE | Freq: Every day | ORAL | 3 refills | Status: DC

## 2023-06-08 NOTE — Progress Notes (Signed)
 BH MD Outpatient Progress Note  06/08/2023 5:18 PM [name]  MRN:  [id number]  Assessment:  [name] presents for follow-up evaluation.  The patient states that she is doing well.  She is relatively verbose, but this is baseline for this patient.  No medication changes planned for today.  Discussed transitioning to the incoming resident psychiatric provider.  The patient will follow-up with me in 2 months, then this transition will occur.  Identifying Information: [name] is a 58 y.o. y.o. 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   Plan:  # Bipolar affective disorder in remission, currently depressed Interventions: -- Continue Wellbutrin 300 mg XL - Continue zonisamide 100 mg nightly - Ideally the patient would be on a better mood stabilizing medication, however, she has been resistant to this and appears stable - The patient is taking MS Contin 15 mg twice daily as well as gabapentin 300 mg twice daily and Flexeril 10 mg twice daily, prescribed by another physician. - It appears the patient restarted Soma to help with pain  # Cannabis use disorder, moderate, dependence Interventions: -- Continue to encourage reduction use or abstinence  Patient was given contact information for behavioral health clinic and was instructed to call 911 for emergencies.   Subjective:  Chief Complaint:  Chief Complaint  Patient presents with   Follow-up    Interval History:  The patient states "I have been a little bit sad, but overall I am doing okay".  The patient reports experiencing minimal anxiety.  She denies experiencing any suicidal thoughts.  She reports occasional poor sleep that frustrates her.  She  reports making behavioral changes that have improved this somewhat.  She endorses having a manic episode, which she says consists of 3 days of poor sleep, with her main activity being "watching TV".  She denies experiencing rapid speech or impulsive behavior.  Patient reports continued frequent use of cannabis, which is unchanged.  She is not interested in quitting.  Visit Diagnosis:    ICD-10-CM   1. Bipolar disorder in remission (HCC)  F31.70 zonisamide (ZONEGRAN) 100 MG capsule    buPROPion (WELLBUTRIN XL) 300 MG 24 hr tablet    2. Encounter for long-term (current) use of medications  Z79.899     3. Cannabis use disorder, moderate, dependence (HCC)  F12.20        Past Psychiatric History: Last behavioral health hospitalization in 2006, which appears to have been voluntary in nature.  The patient reports that she has not attempted suicide in several decades  Past Medical History:  Past Medical History:  Diagnosis Date   Anxiety    Arthritis    neck, knees, shoulders   Asthma    Bipolar disorder (HCC)    currently feeling MANIC- 10/02/2016   Depression    GERD (gastroesophageal reflux disease)    Hip fracture (HCC) 06/2019   History of blood transfusion    as a newborn    History of lump of left breast    Hyperlipidemia    Lumbar pseudoarthrosis    Motion sickness    cars   OSA (obstructive sleep apnea) 01/09/2016   can't afford CPAP   Personality disorder (HCC)    PONV (postoperative nausea and vomiting)  Post traumatic stress disorder (PTSD)    Substance abuse (HCC)    Synovial cyst     Past Surgical History:  Procedure Laterality Date   COLONOSCOPY WITH PROPOFOL N/A 11/04/2017   Procedure: COLONOSCOPY WITH PROPOFOL;  Surgeon: Toney Reil, MD;  Location: [facility];  Service: Endoscopy;  Laterality: N/A;   ESOPHAGOGASTRODUODENOSCOPY (EGD) WITH PROPOFOL N/A 11/04/2017   Procedure: ESOPHAGOGASTRODUODENOSCOPY (EGD) WITH PROPOFOL with biopsies;  Surgeon:  Toney Reil, MD;  Location: [facility];  Service: Endoscopy;  Laterality: N/A;  sleep apnea   KNEE SURGERY Left    x5, post basketball injury   LUMBAR LAMINECTOMY/DECOMPRESSION MICRODISCECTOMY Left 01/22/2016   Procedure: Laminectomy for facet/synovial cyst - left - Lumbar four - lumbar five;  Surgeon: Julio Sicks, MD;  Location: [facility] OR;  Service: Neurosurgery;  Laterality: Left;  Laminectomy for facet/synovial cyst - left - Lumbar four - lumbar five   POLYPECTOMY N/A 11/04/2017   Procedure: POLYPECTOMY INTESTINAL;  Surgeon: Toney Reil, MD;  Location: [facility];  Service: Endoscopy;  Laterality: N/A;   SHOULDER SURGERY Left    x2   SPINE SURGERY N/A    Phreesia 08/07/2019   TOE SURGERY Bilateral    bone spurs    Family Psychiatric History: None pertinent  Family History:  Family History  Problem Relation Age of Onset   Heart disease Father    Hyperlipidemia Father    Alcohol abuse Brother    Alcohol abuse Paternal Uncle    Breast cancer Maternal Aunt        70's   Colon cancer Neg Hx     Social History:  Social History   Socioeconomic History   Marital status: Single    Spouse name: Not on file   Number of children: 0   Years of education: Not on file   Highest education level: Bachelor's degree (e.g., BA, AB, BS)  Occupational History   Not on file  Tobacco Use   Smoking status: Former    Current packs/day: 0.00    Average packs/day: 1 pack/day for 1 year (1.0 ttl pk-yrs)    Types: Cigarettes    Start date: 04/16/1995    Quit date: 04/15/1996    Years since quitting: 27.1    Passive exposure: Past   Smokeless tobacco: Never   Tobacco comments:    started back again in 2016, smoked for about 6 mo and then quit  Vaping Use   Vaping status: Never Used  Substance and Sexual Activity   Alcohol use: No    Alcohol/week: 0.0 standard drinks of alcohol    Comment: quit 20 years   Drug use: Yes    Frequency: 14.0 times per week     Types: Marijuana    Comment: 20 yrs. ago- cocaine    Sexual activity: Not Currently  Other Topics Concern   Not on file  Social History Narrative   Not on file   Social Drivers of Health   Financial Resource Strain: Medium Risk (07/01/2022)   Overall Financial Resource Strain (CARDIA)    Difficulty of Paying Living Expenses: Somewhat hard  Food Insecurity: No Food Insecurity (07/01/2022)   Hunger Vital Sign    Worried About Running Out of Food in the Last Year: Never true    Ran Out of Food in the Last Year: Never true  Transportation Needs: No Transportation Needs (07/01/2022)   PRAPARE - Administrator, Civil Service (Medical): No    Lack  of Transportation (Non-Medical): No  Physical Activity: Insufficiently Active (03/09/2023)   Exercise Vital Sign    Days of Exercise per Week: 4 days    Minutes of Exercise per Session: 30 min  Stress: Patient Declined (07/01/2022)   [employer] of Occupational Health - Occupational Stress Questionnaire    Feeling of Stress : Patient declined  Social Connections: Moderately Isolated (07/01/2022)   Social Connection and Isolation Panel [NHANES]    Frequency of Communication with Friends and Family: More than three times a week    Frequency of Social Gatherings with Friends and Family: More than three times a week    Attends Religious Services: 1 to 4 times per year    Active Member of [employer] or Organizations: No    Attends Banker Meetings: Not on file    Marital Status: Never married    Allergies:  Allergies  Allergen Reactions   Other    Effexor [Venlafaxine] Other (See Comments)    UNSPECIFIED REACTION, headaches, felt funny, withdrawal with missed dose    Lamotrigine Rash and Other (See Comments)    Current Medications: Current Outpatient Medications  Medication Sig Dispense Refill   albuterol (VENTOLIN HFA) 108 (90 Base) MCG/ACT inhaler INHALE 2 PUFFS BY MOUTH EVERY 4 TO 6 HOURS AS NEEDED 18 each 1    aspirin 81 MG tablet Take 81 mg by mouth daily.     azithromycin (ZITHROMAX) 250 MG tablet Take 2 tabs today, then take 1 tab daily until gone. 6 tablet 0   benzonatate (TESSALON) 100 MG capsule Take 1 capsule (100 mg total) by mouth 2 (two) times daily as needed for cough. 20 capsule 0   buPROPion (WELLBUTRIN XL) 300 MG 24 hr tablet Take 1 tablet (300 mg total) by mouth daily. 30 tablet 3   Calcium Carb-Cholecalciferol (OYSTER SHELL CALCIUM W/D) 500-5 MG-MCG TABS TAKE 1 TABLET BY MOUTH THREE TIMES A DAY 90 tablet 6   calcium-vitamin D (OSCAL WITH D) 500-200 MG-UNIT tablet Take 1 tablet by mouth 3 (three) times daily. 90 tablet 6   cyclobenzaprine (FLEXERIL) 10 MG tablet Take 10 mg by mouth 2 (two) times daily as needed. (Patient not taking: Reported on 03/09/2023)     fluticasone (FLONASE) 50 MCG/ACT nasal spray PLACE 2 SPRAYS INTO BOTH NOSTRILS DAILY AS NEEDED FOR ALLERGIES OR RHINITIS. 16 mL 0   Fluticasone-Umeclidin-Vilant (TRELEGY ELLIPTA) 200-62.5-25 MCG/ACT AEPB Inhale 1 puff into the lungs daily. 60 each 5   gabapentin (NEURONTIN) 300 MG capsule Take 1 capsule by mouth 3 (three) times daily.     HYDROcodone bit-homatropine (HYCODAN) 5-1.5 MG/5ML syrup Take 5 mLs by mouth every 6 (six) hours as needed for cough. 120 mL 0   ipratropium (ATROVENT) 0.03 % nasal spray 2 sprays each nostril twice a day as needed for runny nose/drainage down throat. 30 mL 5   levocetirizine (XYZAL) 5 MG tablet TAKE 1 TABLET EVERY DAY AS NEEDED 30 tablet 0   lidocaine (LIDODERM) 5 % Place 1 patch onto the skin every 12 (twelve) hours. Remove & Discard patch within 12 hours or as directed by MD 10 patch 0   mepolizumab (NUCALA) 100 MG/ML SOSY Inject 100 mg into the skin every 28 (twenty-eight) days. 1 mL 11   montelukast (SINGULAIR) 10 MG tablet TAKE 1 TABLET BY MOUTH EVERYDAY AT BEDTIME 30 tablet 0   morphine (MSIR) 15 MG tablet Take 15 mg by mouth every 4 (four) hours as needed for severe pain.  pantoprazole  (PROTONIX) 40 MG tablet Take 1 tablet (40 mg total) by mouth daily. 30 tablet 5   predniSONE (DELTASONE) 10 MG tablet 6,5,4,3,2,1 take each days dose all at once with food 21 tablet 0   promethazine (PHENERGAN) 25 MG tablet Take 1 tablet (25 mg total) by mouth every 8 (eight) hours as needed for nausea or vomiting. (Patient not taking: Reported on 03/09/2023) 20 tablet 0   promethazine-dextromethorphan (PROMETHAZINE-DM) 6.25-15 MG/5ML syrup Take 5 mLs by mouth 4 (four) times daily as needed for cough. 118 mL 0   simvastatin (ZOCOR) 20 MG tablet TAKE 1 TABLET BY MOUTH EVERY DAY AT 6PM 90 tablet 1   SOMA 350 MG tablet Take 1 tablet (350 mg total) by mouth 3 (three) times daily as needed for muscle spasms. 15 tablet 0   triamcinolone (NASACORT) 55 MCG/ACT AERO nasal inhaler      zonisamide (ZONEGRAN) 100 MG capsule Take 1 capsule (100 mg total) by mouth at bedtime. 30 capsule 3   Current Facility-Administered Medications  Medication Dose Route Frequency Provider Last Rate Last Admin   mepolizumab (NUCALA) injection 100 mg  100 mg Subcutaneous Q28 days Marcelyn Bruins, MD   100 mg at 03/31/23 1523     Objective:  Psychiatric Specialty Exam: Physical Exam Constitutional:      Appearance: the patient is not toxic-appearing.  Pulmonary:     Effort: Pulmonary effort is normal.  Neurological:     General: No focal deficit present.     Mental Status: the patient is alert and oriented to person, place, and time.   Review of Systems  Respiratory:  Negative for shortness of breath.   Cardiovascular:  Negative for chest pain.  Gastrointestinal:  Negative for abdominal pain, constipation, diarrhea, nausea and vomiting.  Neurological:  Negative for headaches.      BP 91/64   Pulse 78   Wt 133 lb (60.3 kg)   BMI 25.97 kg/m   General Appearance: Fairly Groomed  Eye Contact:  Good  Speech:  Clear and Coherent  Volume:  Normal  Mood: "Fine"  Affect:  Congruent, somewhat depressed,  able to laugh appropriately  Thought Process:  Coherent  Orientation:  Full (Time, Place, and Person)  Thought Content: Logical   Suicidal Thoughts:  No  Homicidal Thoughts:  No  Memory:  Immediate;   Good  Judgement:  fair  Insight:  fair  Psychomotor Activity:  Normal  Concentration:  Concentration: Good  Recall:  Good  Fund of Knowledge: Good  Language: Good  Akathisia:  No  Handed:    AIMS (if indicated): not done  Assets:  Communication Skills Desire for Improvement Financial Resources/Insurance Housing Leisure Time Physical Health  ADL's:  Intact  Cognition: WNL  Sleep: Poor     Metabolic Disorder Labs: Lab Results  Component Value Date   HGBA1C 5.6 09/17/2021   No results found for: "PROLACTIN" Lab Results  Component Value Date   CHOL 161 09/30/2022   TRIG 91 09/30/2022   HDL 74 09/30/2022   CHOLHDL 2.2 09/30/2022   VLDL 16.1 06/22/2018   LDLCALC 70 09/30/2022   LDLCALC 90 09/17/2021   Lab Results  Component Value Date   TSH 2.380 01/08/2023   TSH 1.480 09/17/2021    Therapeutic Level Labs: No results found for: "LITHIUM" No results found for: "VALPROATE" No results found for: "CBMZ"  Screenings: GAD-7    Flowsheet Row Office Visit from 01/08/2023 in [facility] at [facility]  Visit from 07/15/2022 in [facility] at [facility] Office Visit from 07/01/2022 in [facility]  Total GAD-7 Score 6 7 0      Mini-Mental    Flowsheet Row Office Visit from 04/17/2020 in [facility] at [facility]  Total Score (max 30 points ) 30      PHQ2-9    Flowsheet Row Office Visit from 03/09/2023 in [facility] at [facility] Office Visit from 01/08/2023 in [facility] at [facility] Office Visit from 09/30/2022 in [facility] at [facility] Office Visit from 07/15/2022 in [facility] at [facility] Office Visit from  07/01/2022 in [facility] at [facility]  PHQ-2 Total Score 1 4 0 2 0  PHQ-9 Total Score 4 9 7 8  0      Flowsheet Row ED from 04/21/2023 in [facility] at [facility] ED from 11/03/2022 in [facility] at [facility]) ED from 09/22/2022 in [facility] at [facility])  C-SSRS RISK CATEGORY No Risk No Risk No Risk       Collaboration of Care: none  A total of 30 minutes was spent involved in face to face clinical care, chart review, documentation.   Carlyn Reichert, MD 06/08/2023, 5:18 PM

## 2023-06-09 ENCOUNTER — Encounter (INDEPENDENT_AMBULATORY_CARE_PROVIDER_SITE_OTHER): Payer: Self-pay

## 2023-06-09 NOTE — Addendum Note (Signed)
 Addended by: Everlena Cooper on: 06/09/2023 12:48 PM   Modules accepted: Level of Service

## 2023-06-21 ENCOUNTER — Other Ambulatory Visit: Payer: Self-pay | Admitting: Allergy

## 2023-07-01 ENCOUNTER — Ambulatory Visit (INDEPENDENT_AMBULATORY_CARE_PROVIDER_SITE_OTHER): Admitting: Family

## 2023-07-01 ENCOUNTER — Encounter: Payer: Self-pay | Admitting: Family

## 2023-07-01 VITALS — BP 142/87 | HR 60 | Temp 97.5°F | Resp 18 | Ht 60.5 in | Wt 129.8 lb

## 2023-07-01 DIAGNOSIS — R0989 Other specified symptoms and signs involving the circulatory and respiratory systems: Secondary | ICD-10-CM | POA: Diagnosis not present

## 2023-07-01 DIAGNOSIS — Z130 Encounter for screening for diseases of the blood and blood-forming organs and certain disorders involving the immune mechanism: Secondary | ICD-10-CM | POA: Diagnosis not present

## 2023-07-01 LAB — POC SOFIA 2 FLU + SARS ANTIGEN FIA
Influenza A, POC: NEGATIVE
Influenza B, POC: NEGATIVE
SARS Coronavirus 2 Ag: NEGATIVE

## 2023-07-01 MED ORDER — PROMETHAZINE-DM 6.25-15 MG/5ML PO SYRP: 5.0000 mL | ORAL_SOLUTION | Freq: Four times a day (QID) | ORAL | 0 refills | Status: DC | PRN

## 2023-07-01 MED ORDER — PREDNISONE 10 MG PO TABS: ORAL_TABLET | ORAL | 0 refills | Status: AC

## 2023-07-01 NOTE — Progress Notes (Signed)
 Patient ID: [name], female    DOB: 01/11/1966  MRN: [id number]  CC: Cough  Subjective: [name] is a 57 y.o. female who presents for cough.  Her concerns today include:  - States cough, congestion, and runny nose for several days. Denies red flag symptoms. Reports taking allergy medications to help. States her primary provider Abraham Abo, MD prescribes her a steroid and cough syrup when this happens.  - Requests labs for iron levels.   Patient Active Problem List   Diagnosis Date Noted   Bipolar disorder in remission (HCC) 04/07/2023   Alcohol use disorder, moderate, in sustained remission (HCC) 12/26/2022   Avascular necrosis of bone (HCC) 07/01/2022   Cervical radiculopathy 07/01/2022   Closed fracture of acetabulum (HCC) 07/01/2022   Compression fracture of L1 lumbar vertebra (HCC) 07/01/2022   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   Hiatal hernia 04/21/2020   Other chest pain 04/07/2020   Acute non intractable tension-type headache 04/07/2020   Medial epicondylitis of left elbow 07/19/2019   Pain in left hip 07/19/2019   Asthma exacerbation 11/22/2018   GERD (gastroesophageal reflux disease) 06/22/2018   Cannabis use disorder, moderate, dependence (HCC) 04/22/2018   Insomnia 04/15/2018   Non-intractable vomiting    Elevated blood pressure reading without diagnosis of hypertension 06/13/2017   GAD (generalized anxiety disorder) 05/23/2017   Synovial cyst of lumbar spine 01/16/2016   OSA (obstructive sleep apnea) 01/09/2016   Asthma 11/21/2015   Chronic pain syndrome 11/21/2015   HLD (hyperlipidemia) 09/12/2014     Current Outpatient Medications on File Prior to Visit  Medication Sig Dispense Refill   albuterol  (VENTOLIN  HFA) 108 (90 Base) MCG/ACT inhaler INHALE 2 PUFFS BY MOUTH EVERY 4 TO 6 HOURS AS NEEDED 18 each 1   aspirin 81 MG tablet Take 81 mg by mouth daily.     azithromycin  (ZITHROMAX ) 250 MG tablet Take 2 tabs today, then take 1 tab  daily until gone. 6 tablet 0   benzonatate  (TESSALON ) 100 MG capsule Take 1 capsule (100 mg total) by mouth 2 (two) times daily as needed for cough. 20 capsule 0   buPROPion  (WELLBUTRIN  XL) 300 MG 24 hr tablet Take 1 tablet (300 mg total) by mouth daily. 30 tablet 3   Calcium  Carb-Cholecalciferol (OYSTER SHELL CALCIUM  W/D) 500-5 MG-MCG TABS TAKE 1 TABLET BY MOUTH THREE TIMES A DAY 90 tablet 6   Fluticasone -Umeclidin-Vilant (TRELEGY ELLIPTA ) 200-62.5-25 MCG/ACT AEPB Inhale 1 puff into the lungs daily. 60 each 5   gabapentin  (NEURONTIN ) 300 MG capsule Take 1 capsule by mouth 3 (three) times daily.     HYDROcodone  bit-homatropine (HYCODAN) 5-1.5 MG/5ML syrup Take 5 mLs by mouth every 6 (six) hours as needed for cough. 120 mL 0   ipratropium (ATROVENT ) 0.03 % nasal spray 2 sprays each nostril twice a day as needed for runny nose/drainage down throat. 30 mL 5   levocetirizine (XYZAL ) 5 MG tablet TAKE 1 TABLET EVERY DAY AS NEEDED 30 tablet 0   lidocaine  (LIDODERM ) 5 % Place 1 patch onto the skin every 12 (twelve) hours. Remove & Discard patch within 12 hours or as directed by MD 10 patch 0   mepolizumab  (NUCALA ) 100 MG/ML SOSY Inject 100 mg into the skin every 28 (twenty-eight) days. 1 mL 11   montelukast  (SINGULAIR ) 10 MG tablet TAKE 1 TABLET BY MOUTH EVERYDAY AT BEDTIME 30 tablet 0   morphine (MSIR) 15 MG tablet Take 15 mg by mouth every 4 (four) hours as needed  for severe pain.     pantoprazole  (PROTONIX ) 40 MG tablet Take 1 tablet (40 mg total) by mouth daily. 30 tablet 5   simvastatin  (ZOCOR ) 20 MG tablet TAKE 1 TABLET BY MOUTH EVERY DAY AT 6PM 90 tablet 1   SOMA  350 MG tablet Take 1 tablet (350 mg total) by mouth 3 (three) times daily as needed for muscle spasms. 15 tablet 0   triamcinolone  (NASACORT ) 55 MCG/ACT AERO nasal inhaler      zonisamide  (ZONEGRAN ) 100 MG capsule Take 1 capsule (100 mg total) by mouth at bedtime. 30 capsule 3   calcium -vitamin D  (OSCAL WITH D) 500-200 MG-UNIT tablet Take 1  tablet by mouth 3 (three) times daily. 90 tablet 6   cyclobenzaprine  (FLEXERIL ) 10 MG tablet Take 10 mg by mouth 2 (two) times daily as needed. (Patient not taking: Reported on 03/09/2023)     fluticasone  (FLONASE ) 50 MCG/ACT nasal spray PLACE 2 SPRAYS INTO BOTH NOSTRILS DAILY AS NEEDED FOR ALLERGIES OR RHINITIS. 16 mL 0   promethazine  (PHENERGAN ) 25 MG tablet Take 1 tablet (25 mg total) by mouth every 8 (eight) hours as needed for nausea or vomiting. (Patient not taking: Reported on 03/09/2023) 20 tablet 0   Current Facility-Administered Medications on File Prior to Visit  Medication Dose Route Frequency Provider Last Rate Last Admin   mepolizumab  (NUCALA ) injection 100 mg  100 mg Subcutaneous Q28 days Brian Campanile, MD   100 mg at 03/31/23 1523    Allergies  Allergen Reactions   Other    Effexor [Venlafaxine] Other (See Comments)    UNSPECIFIED REACTION, headaches, felt funny, withdrawal with missed dose    Lamotrigine Rash and Other (See Comments)    Social History   Socioeconomic History   Marital status: Single    Spouse name: Not on file   Number of children: 0   Years of education: Not on file   Highest education level: Bachelor's degree (e.g., BA, AB, BS)  Occupational History   Not on file  Tobacco Use   Smoking status: Former    Current packs/day: 0.00    Average packs/day: 1 pack/day for 1 year (1.0 ttl pk-yrs)    Types: Cigarettes    Start date: 04/16/1995    Quit date: 04/15/1996    Years since quitting: 27.2    Passive exposure: Past   Smokeless tobacco: Never   Tobacco comments:    started back again in 2016, smoked for about 6 mo and then quit  Vaping Use   Vaping status: Never Used  Substance and Sexual Activity   Alcohol use: No    Alcohol/week: 0.0 standard drinks of alcohol    Comment: quit 20 years   Drug use: Yes    Frequency: 14.0 times per week    Types: Marijuana    Comment: 20 yrs. ago- cocaine    Sexual activity: Not Currently  Other  Topics Concern   Not on file  Social History Narrative   Not on file   Social Drivers of Health   Financial Resource Strain: Low Risk  (07/01/2023)   Overall Financial Resource Strain (CARDIA)    Difficulty of Paying Living Expenses: Not hard at all  Food Insecurity: No Food Insecurity (07/01/2023)   Hunger Vital Sign    Worried About Running Out of Food in the Last Year: Never true    Ran Out of Food in the Last Year: Never true  Transportation Needs: No Transportation Needs (07/01/2023)   PRAPARE -  Administrator, Civil Service (Medical): No    Lack of Transportation (Non-Medical): No  Physical Activity: Insufficiently Active (03/09/2023)   Exercise Vital Sign    Days of Exercise per Week: 4 days    Minutes of Exercise per Session: 30 min  Stress: No Stress Concern Present (07/01/2023)   [employer] of Occupational Health - Occupational Stress Questionnaire    Feeling of Stress : Only a little  Social Connections: Socially Isolated (07/01/2023)   Social Connection and Isolation Panel [NHANES]    Frequency of Communication with Friends and Family: More than three times a week    Frequency of Social Gatherings with Friends and Family: Three times a week    Attends Religious Services: Never    Active Member of Clubs or Organizations: No    Attends Banker Meetings: Never    Marital Status: Never married  Intimate Partner Violence: Not At Risk (07/01/2023)   Humiliation, Afraid, Rape, and Kick questionnaire    Fear of Current or Ex-Partner: No    Emotionally Abused: No    Physically Abused: No    Sexually Abused: No    Family History  Problem Relation Age of Onset   Heart disease Father    Hyperlipidemia Father    Alcohol abuse Brother    Alcohol abuse Paternal Uncle    Breast cancer Maternal Aunt        70's   Colon cancer Neg Hx     Past Surgical History:  Procedure Laterality Date   COLONOSCOPY WITH PROPOFOL  N/A 11/04/2017   Procedure:  COLONOSCOPY WITH PROPOFOL ;  Surgeon: Selena Daily, MD;  Location: [facility];  Service: Endoscopy;  Laterality: N/A;   ESOPHAGOGASTRODUODENOSCOPY (EGD) WITH PROPOFOL  N/A 11/04/2017   Procedure: ESOPHAGOGASTRODUODENOSCOPY (EGD) WITH PROPOFOL  with biopsies;  Surgeon: Selena Daily, MD;  Location: [facility];  Service: Endoscopy;  Laterality: N/A;  sleep apnea   KNEE SURGERY Left    x5, post basketball injury   LUMBAR LAMINECTOMY/DECOMPRESSION MICRODISCECTOMY Left 01/22/2016   Procedure: Laminectomy for facet/synovial cyst - left - Lumbar four - lumbar five;  Surgeon: Agustina Aldrich, MD;  Location: [facility] OR;  Service: Neurosurgery;  Laterality: Left;  Laminectomy for facet/synovial cyst - left - Lumbar four - lumbar five   POLYPECTOMY N/A 11/04/2017   Procedure: POLYPECTOMY INTESTINAL;  Surgeon: Selena Daily, MD;  Location: [facility];  Service: Endoscopy;  Laterality: N/A;   SHOULDER SURGERY Left    x2   SPINE SURGERY N/A    Phreesia 08/07/2019   TOE SURGERY Bilateral    bone spurs    ROS: Review of Systems Negative except as stated above  PHYSICAL EXAM: BP (!) 142/87   Pulse 60   Temp (!) 97.5 F (36.4 C) (Oral)   Resp 18   Ht 5' 0.5" (1.537 m)   Wt 129 lb 12.8 oz (58.9 kg)   SpO2 96%   BMI 24.93 kg/m   Physical Exam HENT:     Head: Normocephalic and atraumatic.     Right Ear: Tympanic membrane, ear canal and external ear normal.     Left Ear: Tympanic membrane, ear canal and external ear normal.     Nose: Nose normal.     Mouth/Throat:     Mouth: Mucous membranes are moist.     Pharynx: Oropharynx is clear.  Eyes:     Extraocular Movements: Extraocular movements intact.     Conjunctiva/sclera: Conjunctivae normal.  Pupils: Pupils are equal, round, and reactive to light.  Cardiovascular:     Rate and Rhythm: Normal rate and regular rhythm.     Pulses: Normal pulses.     Heart sounds: Normal heart sounds.  Pulmonary:     Effort:  Pulmonary effort is normal.     Breath sounds: Normal breath sounds.  Musculoskeletal:        General: Normal range of motion.     Cervical back: Normal range of motion and neck supple.  Neurological:     General: No focal deficit present.     Mental Status: She is alert and oriented to person, place, and time.  Psychiatric:        Mood and Affect: Mood normal.        Behavior: Behavior normal.     ASSESSMENT AND PLAN: 1. Upper respiratory symptom (Primary) - Patient today in office with no cardiopulmonary/acute distress.  - Prednisone  and Promethazine -Dextromethorphan as prescribed. Counseled on medication adherence/adverse effects.  - Routine screening.  - Follow-up with primary provider as scheduled. - POC SOFIA 2 FLU + SARS ANTIGEN FIA - predniSONE  (DELTASONE ) 10 MG tablet; 6,5,4,3,2,1 take each days dose all at once with food  Dispense: 21 tablet; Refill: 0 - promethazine -dextromethorphan (PROMETHAZINE -DM) 6.25-15 MG/5ML syrup; Take 5 mLs by mouth 4 (four) times daily as needed for cough.  Dispense: 118 mL; Refill: 0  2. Screening for deficiency anemia - Routine screening.  - CBC - Iron, TIBC and Ferritin Panel     Patient was given the opportunity to ask questions.  Patient verbalized understanding of the plan and was able to repeat key elements of the plan. Patient was given clear instructions to go to Emergency Department or return to medical center if symptoms don't improve, worsen, or new problems develop.The patient verbalized understanding.   Orders Placed This Encounter  Procedures   CBC   Iron, TIBC and Ferritin Panel   POC SOFIA 2 FLU + SARS ANTIGEN FIA     Requested Prescriptions   Signed Prescriptions Disp Refills   predniSONE  (DELTASONE ) 10 MG tablet 21 tablet 0    Sig: 6,5,4,3,2,1 take each days dose all at once with food   promethazine -dextromethorphan (PROMETHAZINE -DM) 6.25-15 MG/5ML syrup 118 mL 0    Sig: Take 5 mLs by mouth 4 (four) times daily  as needed for cough.    Return for Follow-Up or next available with Abraham Abo, MD.  Senaida Dama, NP

## 2023-07-01 NOTE — Progress Notes (Signed)
 Patient has a cough and congestion, patient feeling really tired and lymph nodes are swollen,  runny nose

## 2023-07-02 LAB — IRON,TIBC AND FERRITIN PANEL
Ferritin: 40 ng/mL (ref 15–150)
Iron Saturation: 21 % (ref 15–55)
Iron: 63 ug/dL (ref 27–159)
Total Iron Binding Capacity: 298 ug/dL (ref 250–450)
UIBC: 235 ug/dL (ref 131–425)

## 2023-07-02 LAB — CBC
Hematocrit: 40.4 % (ref 34.0–46.6)
Hemoglobin: 13.2 g/dL (ref 11.1–15.9)
MCH: 31.4 pg (ref 26.6–33.0)
MCHC: 32.7 g/dL (ref 31.5–35.7)
MCV: 96 fL (ref 79–97)
Platelets: 330 10*3/uL (ref 150–450)
RBC: 4.21 x10E6/uL (ref 3.77–5.28)
RDW: 13.5 % (ref 11.7–15.4)
WBC: 6.1 10*3/uL (ref 3.4–10.8)

## 2023-07-06 ENCOUNTER — Other Ambulatory Visit: Payer: Self-pay

## 2023-07-06 ENCOUNTER — Telehealth: Payer: Self-pay | Admitting: Allergy

## 2023-07-06 MED ORDER — MONTELUKAST SODIUM 10 MG PO TABS: ORAL_TABLET | ORAL | 0 refills | Status: DC

## 2023-07-06 MED ORDER — IPRATROPIUM BROMIDE 0.03 % NA SOLN: NASAL | 0 refills | Status: DC

## 2023-07-06 MED ORDER — PANTOPRAZOLE SODIUM 40 MG PO TBEC: 40.0000 mg | DELAYED_RELEASE_TABLET | Freq: Every day | ORAL | 0 refills | Status: DC

## 2023-07-06 MED ORDER — ALBUTEROL SULFATE HFA 108 (90 BASE) MCG/ACT IN AERS: INHALATION_SPRAY | RESPIRATORY_TRACT | 1 refills | Status: DC

## 2023-07-06 MED ORDER — LEVOCETIRIZINE DIHYDROCHLORIDE 5 MG PO TABS: ORAL_TABLET | ORAL | 0 refills | Status: DC

## 2023-07-06 MED ORDER — FLUTICASONE PROPIONATE 50 MCG/ACT NA SUSP: 2.0000 | Freq: Every day | NASAL | 0 refills | Status: DC | PRN

## 2023-07-06 MED ORDER — TRELEGY ELLIPTA 200-62.5-25 MCG/ACT IN AEPB: 1.0000 | INHALATION_SPRAY | Freq: Every day | RESPIRATORY_TRACT | 0 refills | Status: DC

## 2023-07-06 NOTE — Telephone Encounter (Signed)
 Patient called and stated that she needs refills on all her meds that she gets from us . I asked her which ones and neammed them but she said she didn't know. Patients pharmacy is [facility] in [place] on [address]. Patients call back number is [phone]

## 2023-07-21 ENCOUNTER — Encounter: Payer: Self-pay | Admitting: Physician Assistant

## 2023-07-21 ENCOUNTER — Ambulatory Visit: Payer: Self-pay

## 2023-07-21 ENCOUNTER — Ambulatory Visit: Admitting: Physician Assistant

## 2023-07-21 ENCOUNTER — Other Ambulatory Visit: Payer: Self-pay | Admitting: Physician Assistant

## 2023-07-21 ENCOUNTER — Ambulatory Visit (HOSPITAL_COMMUNITY)
Admission: RE | Admit: 2023-07-21 | Discharge: 2023-07-21 | Disposition: A | Discharge status: Home / Self Care | Source: Ambulatory Visit | Attending: Physician Assistant | Admitting: Physician Assistant

## 2023-07-21 VITALS — BP 144/81 | HR 83 | Ht 60.0 in | Wt 130.0 lb

## 2023-07-21 DIAGNOSIS — R0781 Pleurodynia: Secondary | ICD-10-CM | POA: Diagnosis not present

## 2023-07-21 DIAGNOSIS — F5104 Psychophysiologic insomnia: Secondary | ICD-10-CM

## 2023-07-21 MED ORDER — HYDROXYZINE HCL 25 MG PO TABS: ORAL_TABLET | ORAL | 0 refills | Status: DC

## 2023-07-21 NOTE — Patient Instructions (Addendum)
 VISIT SUMMARY:  Today, we discussed your recent rib pain, sleep disturbances, and ongoing chronic pain management. We also reviewed your history of depression, alcohol dependence, and asthma.  YOUR PLAN:  -RIB PAIN: You have acute left rib pain from an injury that occurred while pushing down trash. This pain is not well controlled with your current medications. We will order a chest x-ray to check for any rib fractures.   -INSOMNIA: You have chronic insomnia that is worsened by depression. Given your history, we will prescribe hydroxyzine  for sleep, which is considered safer for you.  Chest Wall Pain Chest wall pain is pain in or around the bones and muscles of your chest. Sometimes, an injury causes this pain. Excessive coughing or overuse of arm and chest muscles may also cause chest wall pain. Sometimes, the cause may not be known. This pain may take several weeks or longer to get better. Follow these instructions at home: Managing pain, stiffness, and swelling  If directed, put ice on the painful area: Put ice in a plastic bag. Place a towel between your skin and the bag. Leave the ice on for 20 minutes, 2-3 times per day. Activity Rest as told by your health care provider. Avoid activities that cause pain. These include any activities that use your chest muscles or your abdominal and side muscles to lift heavy items. Ask your health care provider what activities are safe for you. General instructions  Take over-the-counter and prescription medicines only as told by your health care provider. Do not use any products that contain nicotine or tobacco, such as cigarettes, e-cigarettes, and chewing tobacco. These can delay healing after injury. If you need help quitting, ask your health care provider. Keep all follow-up visits as told by your health care provider. This is important. Contact a health care provider if: You have a fever. Your chest pain becomes worse. You have new  symptoms. Get help right away if: You have nausea or vomiting. You feel sweaty or light-headed. You have a cough with mucus from your lungs (sputum) or you cough up blood. You develop shortness of breath. These symptoms may represent a serious problem that is an emergency. Do not wait to see if the symptoms will go away. Get medical help right away. Call your local emergency services (911 in the [place] Do not drive yourself to the hospital. Summary Chest wall pain is pain in or around the bones and muscles of your chest. Depending on the cause, it may be treated with ice, rest, medicines, and avoiding activities that cause pain. Contact a health care provider if you have a fever, worsening chest pain, or new symptoms. Get help right away if you feel light-headed or you develop shortness of breath. These symptoms may be an emergency. This information is not intended to replace advice given to you by your health care provider. Make sure you discuss any questions you have with your health care provider. Document Revised: 02/10/2022 Document Reviewed: 02/10/2022 Elsevier Patient Education  2024 Elsevier Inc.  Insomnia Insomnia is a sleep disorder that makes it difficult to fall asleep or stay asleep. Insomnia can cause fatigue, low energy, difficulty concentrating, mood swings, and poor performance at work or school. There are three different ways to classify insomnia: Difficulty falling asleep. Difficulty staying asleep. Waking up too early in the morning. Any type of insomnia can be long-term (chronic) or short-term (acute). Both are common. Short-term insomnia usually lasts for 3 months or less. Chronic insomnia occurs at  least three times a week for longer than 3 months. What are the causes? Insomnia may be caused by another condition, situation, or substance, such as: Having certain mental health conditions, such as anxiety and depression. Using caffeine, alcohol, tobacco, or  drugs. Having gastrointestinal conditions, such as gastroesophageal reflux disease (GERD). Having certain medical conditions. These include: Asthma. Alzheimer's disease. Stroke. Chronic pain. An overactive thyroid  gland (hyperthyroidism). Other sleep disorders, such as restless legs syndrome and sleep apnea. Menopause. Sometimes, the cause of insomnia may not be known. What increases the risk? Risk factors for insomnia include: Gender. Females are affected more often than males. Age. Insomnia is more common as people get older. Stress and certain medical and mental health conditions. Lack of exercise. Having an irregular work schedule. This may include working night shifts and traveling between different time zones. What are the signs or symptoms? If you have insomnia, the main symptom is having trouble falling asleep or having trouble staying asleep. This may lead to other symptoms, such as: Feeling tired or having low energy. Feeling nervous about going to sleep. Not feeling rested in the morning. Having trouble concentrating. Feeling irritable, anxious, or depressed. How is this diagnosed? This condition may be diagnosed based on: Your symptoms and medical history. Your health care provider may ask about: Your sleep habits. Any medical conditions you have. Your mental health. A physical exam. How is this treated? Treatment for insomnia depends on the cause. Treatment may focus on treating an underlying condition that is causing the insomnia. Treatment may also include: Medicines to help you sleep. Counseling or therapy. Lifestyle adjustments to help you sleep better. Follow these instructions at home: Eating and drinking  Limit or avoid alcohol, caffeinated beverages, and products that contain nicotine and tobacco, especially close to bedtime. These can disrupt your sleep. Do not eat a large meal or eat spicy foods right before bedtime. This can lead to digestive  discomfort that can make it hard for you to sleep. Sleep habits  Keep a sleep diary to help you and your health care provider figure out what could be causing your insomnia. Write down: When you sleep. When you wake up during the night. How well you sleep and how rested you feel the next day. Any side effects of medicines you are taking. What you eat and drink. Make your bedroom a dark, comfortable place where it is easy to fall asleep. Put up shades or blackout curtains to block light from outside. Use a white noise machine to block noise. Keep the temperature cool. Limit screen use before bedtime. This includes: Not watching TV. Not using your smartphone, tablet, or computer. Stick to a routine that includes going to bed and waking up at the same times every day and night. This can help you fall asleep faster. Consider making a quiet activity, such as reading, part of your nighttime routine. Try to avoid taking naps during the day so that you sleep better at night. Get out of bed if you are still awake after 15 minutes of trying to sleep. Keep the lights down, but try reading or doing a quiet activity. When you feel sleepy, go back to bed. General instructions Take over-the-counter and prescription medicines only as told by your health care provider. Exercise regularly as told by your health care provider. However, avoid exercising in the hours right before bedtime. Use relaxation techniques to manage stress. Ask your health care provider to suggest some techniques that may work well for you.  These may include: Breathing exercises. Routines to release muscle tension. Visualizing peaceful scenes. Make sure that you drive carefully. Do not drive if you feel very sleepy. Keep all follow-up visits. This is important. Contact a health care provider if: You are tired throughout the day. You have trouble in your daily routine due to sleepiness. You continue to have sleep problems, or your  sleep problems get worse. Get help right away if: You have thoughts about hurting yourself or someone else. Get help right away if you feel like you may hurt yourself or others, or have thoughts about taking your own life. Go to your nearest emergency room or: Call 911. Call the National Suicide Prevention Lifeline at (231)272-2016 or 988. This is open 24 hours a day. Text the Crisis Text Line at 838-536-2253. Summary Insomnia is a sleep disorder that makes it difficult to fall asleep or stay asleep. Insomnia can be long-term (chronic) or short-term (acute). Treatment for insomnia depends on the cause. Treatment may focus on treating an underlying condition that is causing the insomnia. Keep a sleep diary to help you and your health care provider figure out what could be causing your insomnia. This information is not intended to replace advice given to you by your health care provider. Make sure you discuss any questions you have with your health care provider. Document Revised: 01/28/2021 Document Reviewed: 01/28/2021 Elsevier Patient Education  2024 [employer].

## 2023-07-21 NOTE — Telephone Encounter (Signed)
  Chief Complaint: Injured left ribs Sunday filling and pulling a trash can. Symptoms: above Frequency: Sunday Pertinent Negatives: Patient denies SOB Disposition: [] ED /[] Urgent Care (no appt availability in office) / [] Appointment(In office/virtual)/ []  [facility]/ [] Home Care/ [] Refused Recommended Disposition /[x] [facility]/ []  Follow-up with PCP Additional Notes: agrees  Reason for Disposition . [1] Chest pain lasts > 5 minutes AND [2] occurred > 3 days ago (72 hours) AND [3] NO chest pain or cardiac symptoms now  Answer Assessment - Initial Assessment Questions 1. LOCATION: "Where does it hurt?"       Left side 2. RADIATION: "Does the pain go anywhere else?" (e.g., into neck, jaw, arms, back)     no 3. ONSET: "When did the chest pain begin?" (Minutes, hours or days)      Sunday 4. PATTERN: "Does the pain come and go, or has it been constant since it started?"  "Does it get worse with exertion?"      Comes and goes 5. DURATION: "How long does it last" (e.g., seconds, minutes, hours)     hours 6. SEVERITY: "How bad is the pain?"  (e.g., Scale 1-10; mild, moderate, or severe)    - MILD (1-3): doesn't interfere with normal activities     - MODERATE (4-7): interferes with normal activities or awakens from sleep    - SEVERE (8-10): excruciating pain, unable to do any normal activities       severe 7. CARDIAC RISK FACTORS: "Do you have any history of heart problems or risk factors for heart disease?" (e.g., angina, prior heart attack; diabetes, high blood pressure, high cholesterol, smoker, or strong family history of heart disease)     no 8. PULMONARY RISK FACTORS: "Do you have any history of lung disease?"  (e.g., blood clots in lung, asthma, emphysema, birth control pills)     no 9. CAUSE: "What do you think is causing the chest pain?"     Rib injury 10. OTHER SYMPTOMS: "Do you have any other symptoms?" (e.g., dizziness, nausea, vomiting, sweating, fever,  difficulty breathing, cough)       no 11. PREGNANCY: "Is there any chance you are pregnant?" "When was your last menstrual period?"       no  Protocols used: Chest Pain-A-AH

## 2023-07-21 NOTE — Telephone Encounter (Signed)
 Call dropped due to connection issues prior to transfer. Call transferred to another nurse by agent  Copied from CRM [id number]. Topic: Clinical - Red Word Triage >> Jul 21, 2023  8:50 AM [name] H wrote: Red Word that prompted transfer to Nurse Triage: PT states she hurt the left side of her rib cage right under breast on Sunday, states it hurts to breathe and move. States she doesn't have the money to go to urgent care or ER but would like an X-ray done.

## 2023-07-21 NOTE — Progress Notes (Signed)
 Established Patient Office Visit  Subjective   Patient ID: [name], female    DOB: 23-Dec-1965  Age: 58 y.o. MRN: [id number]  Chief Complaint  Patient presents with   Insomnia    Patient states she has bipolar Disorder, She hasn't slept since Friday.    Rib Injury    Patient was trying to push the trash down in the trash can, and could have possibly hit her rib    Discussed the use of AI scribe software for clinical note transcription with the patient, who gave verbal consent to proceed.  History of Present Illness   Janice Santore "Lorelee Roger" is a 58 year old female with bipolar disorder and chronic pain who presents with rib pain and sleep disturbances.  She has had persistent left-sided rib pain after pushing down trash in a large trash can two days ago.  States that she had to leap to be able to push the trash in and hit her side on the side of the trash can .the pain worsens with breathing and coughing. Despite being on morphine and carisoprodol  for chronic pain, she continues to experience significant discomfort.  She is currently experiencing sleep disturbances due to a manic episode, states she has not slept in 4 days. She has previously used trazodone  for sleep but discontinued it due to feeling unsteady upon waking. She has not used any sleep aids recently and is cautious about medication interactions with her current use of morphine and carisoprodol .   She is a recovering alcoholic, sober since 1996, and is cautious about medications that may affect her respiratory system or cause grogginess. She has a history of asthma and anxiety, particularly in medical settings, which can elevate her blood pressure. She monitors her blood pressure at home using a watch.      Past Medical History:  Diagnosis Date   Anxiety    Arthritis    neck, knees, shoulders   Asthma    Bipolar disorder (HCC)    currently feeling MANIC- 10/02/2016   Depression    GERD (gastroesophageal reflux  disease)    Hip fracture (HCC) 06/2019   History of blood transfusion    as a newborn    History of lump of left breast    Hyperlipidemia    Lumbar pseudoarthrosis    Motion sickness    cars   OSA (obstructive sleep apnea) 01/09/2016   can't afford CPAP   Personality disorder (HCC)    PONV (postoperative nausea and vomiting)    Post traumatic stress disorder (PTSD)    Substance abuse (HCC)    Synovial cyst    Social History   Socioeconomic History   Marital status: Single    Spouse name: Not on file   Number of children: 0   Years of education: Not on file   Highest education level: Bachelor's degree (e.g., BA, AB, BS)  Occupational History   Not on file  Tobacco Use   Smoking status: Former    Current packs/day: 0.00    Average packs/day: 1 pack/day for 1 year (1.0 ttl pk-yrs)    Types: Cigarettes    Start date: 04/16/1995    Quit date: 04/15/1996    Years since quitting: 27.2    Passive exposure: Past   Smokeless tobacco: Never   Tobacco comments:    started back again in 2016, smoked for about 6 mo and then quit  Vaping Use   Vaping status: Never Used  Substance and Sexual  Activity   Alcohol use: No    Alcohol/week: 0.0 standard drinks of alcohol    Comment: quit 20 years   Drug use: Yes    Frequency: 14.0 times per week    Types: Marijuana    Comment: 20 yrs. ago- cocaine    Sexual activity: Not Currently  Other Topics Concern   Not on file  Social History Narrative   Not on file   Social Drivers of Health   Financial Resource Strain: Low Risk  (07/01/2023)   Overall Financial Resource Strain (CARDIA)    Difficulty of Paying Living Expenses: Not hard at all  Food Insecurity: No Food Insecurity (07/01/2023)   Hunger Vital Sign    Worried About Running Out of Food in the Last Year: Never true    Ran Out of Food in the Last Year: Never true  Transportation Needs: No Transportation Needs (07/01/2023)   PRAPARE - Administrator, Civil Service  (Medical): No    Lack of Transportation (Non-Medical): No  Physical Activity: Insufficiently Active (03/09/2023)   Exercise Vital Sign    Days of Exercise per Week: 4 days    Minutes of Exercise per Session: 30 min  Stress: No Stress Concern Present (07/01/2023)   [employer] of Occupational Health - Occupational Stress Questionnaire    Feeling of Stress : Only a little  Social Connections: Socially Isolated (07/01/2023)   Social Connection and Isolation Panel [NHANES]    Frequency of Communication with Friends and Family: More than three times a week    Frequency of Social Gatherings with Friends and Family: Three times a week    Attends Religious Services: Never    Active Member of Clubs or Organizations: No    Attends Banker Meetings: Never    Marital Status: Never married  Intimate Partner Violence: Not At Risk (07/01/2023)   Humiliation, Afraid, Rape, and Kick questionnaire    Fear of Current or Ex-Partner: No    Emotionally Abused: No    Physically Abused: No    Sexually Abused: No   Family History  Problem Relation Age of Onset   Heart disease Father    Hyperlipidemia Father    Alcohol abuse Brother    Alcohol abuse Paternal Uncle    Breast cancer Maternal Aunt        70's   Colon cancer Neg Hx    Allergies  Allergen Reactions   Other    Effexor [Venlafaxine] Other (See Comments)    UNSPECIFIED REACTION, headaches, felt funny, withdrawal with missed dose    Lamotrigine Rash and Other (See Comments)    Review of Systems  Constitutional: Negative.   HENT: Negative.    Eyes: Negative.   Respiratory:  Negative for shortness of breath.   Cardiovascular:  Negative for chest pain.  Gastrointestinal: Negative.   Genitourinary: Negative.   Musculoskeletal:  Positive for myalgias.  Skin: Negative.   Neurological: Negative.   Endo/Heme/Allergies: Negative.   Psychiatric/Behavioral:  The patient has insomnia.       Objective:     BP (!) 144/81  (BP Location: Left Arm, Patient Position: Sitting, Cuff Size: Large)   Pulse 83   Ht 5' (1.524 m)   Wt 130 lb (59 kg)   SpO2 98%   BMI 25.39 kg/m  BP Readings from Last 3 Encounters:  07/21/23 (!) 144/81  07/01/23 (!) 142/87  04/21/23 (!) 180/102   Wt Readings from Last 3 Encounters:  07/21/23 130 lb (59  kg)  07/01/23 129 lb 12.8 oz (58.9 kg)  04/21/23 135 lb (61.2 kg)    Physical Exam Vitals and nursing note reviewed.  Constitutional:      Appearance: Normal appearance.  HENT:     Head: Normocephalic and atraumatic.     Right Ear: External ear normal.     Left Ear: External ear normal.     Nose: Nose normal.     Mouth/Throat:     Mouth: Mucous membranes are moist.     Pharynx: Oropharynx is clear.  Eyes:     Extraocular Movements: Extraocular movements intact.     Conjunctiva/sclera: Conjunctivae normal.     Pupils: Pupils are equal, round, and reactive to light.  Cardiovascular:     Rate and Rhythm: Normal rate and regular rhythm.     Pulses: Normal pulses.     Heart sounds: Normal heart sounds.  Pulmonary:     Effort: Pulmonary effort is normal.     Breath sounds: Normal breath sounds.  Chest:     Chest wall: Tenderness present. No mass, lacerations or deformity.     Comments: Tender to palpation under left breast Musculoskeletal:        General: Normal range of motion.     Cervical back: Normal range of motion and neck supple.  Skin:    General: Skin is warm and dry.  Neurological:     General: No focal deficit present.     Mental Status: She is alert and oriented to person, place, and time.  Psychiatric:        Attention and Perception: Attention normal.        Mood and Affect: Mood normal.        Speech: Speech normal.        Behavior: Behavior normal. Behavior is cooperative.        Thought Content: Thought content normal.        Cognition and Memory: Cognition normal.        Judgment: Judgment normal.      Assessment & Plan:   Problem List  Items Addressed This Visit       Other   Insomnia   Relevant Medications   hydrOXYzine  (ATARAX ) 25 MG tablet   Other Visit Diagnoses       Rib pain on left side    -  Primary      1. Rib pain on left side (Primary)  Acute left rib pain post-injury, patient education given on supportive care, red flags given for prompt reevaluation. - Order chest x-ray to evaluate for rib fracture.   2. Psychophysiological insomnia Trial hydroxyzine .  Patient education given on supportive care, good sleep hygiene. - hydrOXYzine  (ATARAX ) 25 MG tablet; Take 1-2 tabs PO at bedtime prn  Dispense: 30 tablet; Refill: 0   I have reviewed the patient's medical history (PMH, PSH, Social History, Family History, Medications, and allergies) , and have been updated if relevant. I spent 30 minutes reviewing chart and  face to face time with patient.    Return if symptoms worsen or fail to improve.    Etter Hermann Mayers, PA-C

## 2023-07-28 ENCOUNTER — Ambulatory Visit: Payer: Self-pay | Admitting: Physician Assistant

## 2023-07-28 ENCOUNTER — Encounter: Payer: Self-pay | Admitting: Physician Assistant

## 2023-08-01 ENCOUNTER — Other Ambulatory Visit: Payer: Self-pay | Admitting: Physician Assistant

## 2023-08-01 DIAGNOSIS — F5104 Psychophysiologic insomnia: Secondary | ICD-10-CM

## 2023-08-02 ENCOUNTER — Other Ambulatory Visit: Payer: Self-pay | Admitting: Allergy

## 2023-08-03 NOTE — Telephone Encounter (Signed)
 Requested medication (s) are due for refill today: yes  Requested medication (s) are on the active medication list: yes  Last refill:  07/21/23 #30  Future visit scheduled: no  Notes to clinic:  last prescribed by C. Mayers PA   Requested Prescriptions  Pending Prescriptions Disp Refills   hydrOXYzine  (ATARAX ) 25 MG tablet [Pharmacy Med Name: HYDROXYZINE  HCL 25 MG TABLET] 30 tablet 0    Sig: TAKE 1 TO 2 TABLETS BY MOUTH AT BEDTIME AS NEEDED     Ear, Nose, and Throat:  Antihistamines 2 Passed - 08/03/2023  2:40 PM      Passed - Cr in normal range and within 360 days    Creatinine, Ser  Date Value Ref Range Status  04/21/2023 0.91 0.44 - 1.00 mg/dL Final         Passed - Valid encounter within last 12 months    Recent Outpatient Visits           1 month ago Upper respiratory symptom   [facility], Amy J, NP   4 months ago Upper respiratory symptom   [facility], Stan Eans, PA-C   6 months ago Other fatigue   [facility], MD   10 months ago Annual physical exam   [facility], MD   11 months ago Upper respiratory symptom   [facility], Annalee Barren, NP

## 2023-08-05 ENCOUNTER — Other Ambulatory Visit: Payer: Self-pay | Admitting: Family Medicine

## 2023-08-05 DIAGNOSIS — Z1231 Encounter for screening mammogram for malignant neoplasm of breast: Secondary | ICD-10-CM

## 2023-08-07 ENCOUNTER — Ambulatory Visit

## 2023-08-12 ENCOUNTER — Ambulatory Visit (HOSPITAL_COMMUNITY): Admitting: Student

## 2023-08-12 DIAGNOSIS — F5104 Psychophysiologic insomnia: Secondary | ICD-10-CM

## 2023-08-12 DIAGNOSIS — F3170 Bipolar disorder, currently in remission, most recent episode unspecified: Secondary | ICD-10-CM

## 2023-08-12 MED ORDER — BUPROPION HCL ER (XL) 300 MG PO TB24: 300.0000 mg | ORAL_TABLET | Freq: Every day | ORAL | 3 refills | Status: DC

## 2023-08-12 MED ORDER — HYDROXYZINE HCL 25 MG PO TABS
ORAL_TABLET | ORAL | 0 refills | Status: DC
Start: 2023-08-12 — End: 2023-09-09

## 2023-08-12 MED ORDER — ZONISAMIDE 100 MG PO CAPS: 100.0000 mg | ORAL_CAPSULE | Freq: Every day | ORAL | 3 refills | Status: DC

## 2023-08-12 NOTE — Progress Notes (Signed)
 BH MD Outpatient Progress Note  08/13/2023 5:35 PM [name]  MRN:  [id number]  Assessment:  [name] presents for follow-up evaluation.  Patient begins the appointment by spontaneously reporting I have been manic recently.  Based on her description and further questioning, it appears that she has been experiencing hypomanic episodes.  Today, as at previous appointments, the patient is quite resistant to medication changes.  She categorically refuses Depakote, lithium, and second-generation antipsychotics.  We discussed lamotrigine but it appears the patient experienced a rash, albeit a mild one, on the medication previously.  She will continue her regimen of Wellbutrin  and Zonegran , though we would strongly prefer for her to be on an antimanic mood stabilizing agent.  The patient was prescribed hydroxyzine  by her primary care provider, and she has found it helpful for insomnia.  It would be beneficial for her to continue this medication on an as-needed basis for sleep.  Discussed transition to new resident psychiatrist due to routine resident transition in the clinic.  This appointment is scheduled for 7/16.  Identifying Information: [name] is a 58 y.o. y.o. 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   Plan:  # Bipolar affective disorder in remission, currently depressed Interventions: -- Continue Wellbutrin  300 mg XL - Continue zonisamide  100 mg nightly - Ideally the patient would be on a better mood stabilizing medication, however, and hypomanic symptomatology is currently limited - Continue hydroxyzine  nightly as needed - The patient is taking MS Contin 15 mg twice daily as well as gabapentin  300 mg  twice daily and Flexeril  10 mg twice daily, prescribed by another physician. - It appears the patient restarted Soma  to help with pain  # Cannabis use disorder, moderate, dependence Interventions: -- Continue to encourage reduction use or abstinence  Patient was given contact information for behavioral health clinic and was instructed to call 911 for emergencies.   Subjective:  Chief Complaint:  Chief Complaint  Patient presents with   Follow-up    Interval History:  Today the patient reports episodes of decreased need for sleep as well as increased goal-directed activity, lasting for approximately 7 days at a time.  During these episodes, she reports getting only a few hours of sleep each night.  She reports taking care of housework in a frenetic manner.  She says that these episodes are not followed by depression.  She feels that they are much more mild than previous manic episodes.  She attributes this to being older.  The patient denies experiencing impulsive behavior or grandiose thought content.  The patient exhibits verbose and rapid speech at baseline.  She is also distractible at baseline.  The patient feels her most recent hypomanic episode ended a few days ago.  She reports that she feels okay.  She denies having had any thoughts of self-harm since her last appointment.  She does not feel she has experienced consistent depression since that time.  She reports that her sleep cycle has returned to 4 to 5 hours per night, which she says is her baseline.  She says that she is socializing with friends and taking care of normal activities of daily living.  Visit Diagnosis:    ICD-10-CM   1. Psychophysiological insomnia  F51.04 hydrOXYzine  (  ATARAX ) 25 MG tablet    2. Bipolar disorder in remission (HCC)  F31.70 zonisamide  (ZONEGRAN ) 100 MG capsule    buPROPion  (WELLBUTRIN  XL) 300 MG 24 hr tablet       Past Psychiatric History: Last behavioral health hospitalization in 2006, which  appears to have been voluntary in nature.  The patient reports that she has not attempted suicide in several decades  Past Medical History:  Past Medical History:  Diagnosis Date   Anxiety    Arthritis    neck, knees, shoulders   Asthma    Bipolar disorder (HCC)    currently feeling MANIC- 10/02/2016   Depression    GERD (gastroesophageal reflux disease)    Hip fracture (HCC) 06/2019   History of blood transfusion    as a newborn    History of lump of left breast    Hyperlipidemia    Lumbar pseudoarthrosis    Motion sickness    cars   OSA (obstructive sleep apnea) 01/09/2016   can't afford CPAP   Personality disorder (HCC)    PONV (postoperative nausea and vomiting)    Post traumatic stress disorder (PTSD)    Substance abuse (HCC)    Synovial cyst     Past Surgical History:  Procedure Laterality Date   COLONOSCOPY WITH PROPOFOL  N/A 11/04/2017   Procedure: COLONOSCOPY WITH PROPOFOL ;  Surgeon: Selena Daily, MD;  Location: [facility];  Service: Endoscopy;  Laterality: N/A;   ESOPHAGOGASTRODUODENOSCOPY (EGD) WITH PROPOFOL  N/A 11/04/2017   Procedure: ESOPHAGOGASTRODUODENOSCOPY (EGD) WITH PROPOFOL  with biopsies;  Surgeon: Selena Daily, MD;  Location: [facility];  Service: Endoscopy;  Laterality: N/A;  sleep apnea   KNEE SURGERY Left    x5, post basketball injury   LUMBAR LAMINECTOMY/DECOMPRESSION MICRODISCECTOMY Left 01/22/2016   Procedure: Laminectomy for facet/synovial cyst - left - Lumbar four - lumbar five;  Surgeon: Agustina Aldrich, MD;  Location: [facility] OR;  Service: Neurosurgery;  Laterality: Left;  Laminectomy for facet/synovial cyst - left - Lumbar four - lumbar five   POLYPECTOMY N/A 11/04/2017   Procedure: POLYPECTOMY INTESTINAL;  Surgeon: Selena Daily, MD;  Location: [facility];  Service: Endoscopy;  Laterality: N/A;   SHOULDER SURGERY Left    x2   SPINE SURGERY N/A    [employer] 08/07/2019   TOE SURGERY Bilateral    bone spurs     Family Psychiatric History: None pertinent  Family History:  Family History  Problem Relation Age of Onset   Heart disease Father    Hyperlipidemia Father    Alcohol abuse Brother    Alcohol abuse Paternal Uncle    Breast cancer Maternal Aunt        70's   Colon cancer Neg Hx     Social History:  Social History   Socioeconomic History   Marital status: Single    Spouse name: Not on file   Number of children: 0   Years of education: Not on file   Highest education level: Bachelor's degree (e.g., BA, AB, BS)  Occupational History   Not on file  Tobacco Use   Smoking status: Former    Current packs/day: 0.00    Average packs/day: 1 pack/day for 1 year (1.0 ttl pk-yrs)    Types: Cigarettes    Start date: 04/16/1995    Quit date: 04/15/1996    Years since quitting: 27.3    Passive exposure: Past   Smokeless tobacco: Never   Tobacco comments:    started back again  in 2016, smoked for about 6 mo and then quit  Vaping Use   Vaping status: Never Used  Substance and Sexual Activity   Alcohol use: No    Alcohol/week: 0.0 standard drinks of alcohol    Comment: quit 20 years   Drug use: Yes    Frequency: 14.0 times per week    Types: Marijuana    Comment: 20 yrs. ago- cocaine    Sexual activity: Not Currently  Other Topics Concern   Not on file  Social History Narrative   Not on file   Social Drivers of Health   Financial Resource Strain: Low Risk  (07/01/2023)   Overall Financial Resource Strain (CARDIA)    Difficulty of Paying Living Expenses: Not hard at all  Food Insecurity: No Food Insecurity (07/01/2023)   Hunger Vital Sign    Worried About Running Out of Food in the Last Year: Never true    Ran Out of Food in the Last Year: Never true  Transportation Needs: No Transportation Needs (07/01/2023)   PRAPARE - Administrator, Civil Service (Medical): No    Lack of Transportation (Non-Medical): No  Physical Activity: Insufficiently Active (03/09/2023)    Exercise Vital Sign    Days of Exercise per Week: 4 days    Minutes of Exercise per Session: 30 min  Stress: No Stress Concern Present (07/01/2023)   [employer] of Occupational Health - Occupational Stress Questionnaire    Feeling of Stress : Only a little  Social Connections: Socially Isolated (07/01/2023)   Social Connection and Isolation Panel    Frequency of Communication with Friends and Family: More than three times a week    Frequency of Social Gatherings with Friends and Family: Three times a week    Attends Religious Services: Never    Active Member of Clubs or Organizations: No    Attends Banker Meetings: Never    Marital Status: Never married    Allergies:  Allergies  Allergen Reactions   Other    Effexor [Venlafaxine] Other (See Comments)    UNSPECIFIED REACTION, headaches, felt funny, withdrawal with missed dose    Lamotrigine Rash and Other (See Comments)    Current Medications: Current Outpatient Medications  Medication Sig Dispense Refill   albuterol  (VENTOLIN  HFA) 108 (90 Base) MCG/ACT inhaler INHALE 2 PUFFS BY MOUTH EVERY 4 TO 6 HOURS AS NEEDED 18 each 1   aspirin 81 MG tablet Take 81 mg by mouth daily.     azithromycin  (ZITHROMAX ) 250 MG tablet Take 2 tabs today, then take 1 tab daily until gone. 6 tablet 0   benzonatate  (TESSALON ) 100 MG capsule Take 1 capsule (100 mg total) by mouth 2 (two) times daily as needed for cough. 20 capsule 0   buPROPion  (WELLBUTRIN  XL) 300 MG 24 hr tablet Take 1 tablet (300 mg total) by mouth daily. 30 tablet 3   Calcium  Carb-Cholecalciferol (OYSTER SHELL CALCIUM  W/D) 500-5 MG-MCG TABS TAKE 1 TABLET BY MOUTH THREE TIMES A DAY 90 tablet 6   calcium -vitamin D  (OSCAL WITH D) 500-200 MG-UNIT tablet Take 1 tablet by mouth 3 (three) times daily. 90 tablet 6   cyclobenzaprine  (FLEXERIL ) 10 MG tablet Take 10 mg by mouth 2 (two) times daily as needed.     fluticasone  (FLONASE ) 50 MCG/ACT nasal spray Place 2 sprays into  both nostrils daily as needed for allergies or rhinitis. 16 mL 0   Fluticasone -Umeclidin-Vilant (TRELEGY ELLIPTA ) 200-62.5-25 MCG/ACT AEPB Inhale 1 puff into the  lungs daily. 60 each 0   gabapentin  (NEURONTIN ) 300 MG capsule Take 1 capsule by mouth 3 (three) times daily.     HYDROcodone  bit-homatropine (HYCODAN) 5-1.5 MG/5ML syrup Take 5 mLs by mouth every 6 (six) hours as needed for cough. 120 mL 0   hydrOXYzine  (ATARAX ) 25 MG tablet Take 1-2 tabs PO at bedtime prn 30 tablet 0   ipratropium (ATROVENT ) 0.03 % nasal spray 2 SPRAYS EACH NOSTRIL TWICE A DAY AS NEEDED FOR RUNNY NOSE/DRAINAGE DOWN THROAT. 30 mL 5   levocetirizine (XYZAL ) 5 MG tablet TAKE 1 TABLET EVERY DAY AS NEEDED 30 tablet 0   lidocaine  (LIDODERM ) 5 % Place 1 patch onto the skin every 12 (twelve) hours. Remove & Discard patch within 12 hours or as directed by MD 10 patch 0   mepolizumab  (NUCALA ) 100 MG/ML SOSY Inject 100 mg into the skin every 28 (twenty-eight) days. 1 mL 11   montelukast  (SINGULAIR ) 10 MG tablet TAKE 1 TABLET BY MOUTH EVERYDAY AT BEDTIME 30 tablet 0   morphine (MSIR) 15 MG tablet Take 15 mg by mouth every 4 (four) hours as needed for severe pain.     pantoprazole  (PROTONIX ) 40 MG tablet Take 1 tablet (40 mg total) by mouth daily. 30 tablet 0   predniSONE  (DELTASONE ) 10 MG tablet 6,5,4,3,2,1 take each days dose all at once with food 21 tablet 0   promethazine  (PHENERGAN ) 25 MG tablet Take 1 tablet (25 mg total) by mouth every 8 (eight) hours as needed for nausea or vomiting. 20 tablet 0   promethazine -dextromethorphan (PROMETHAZINE -DM) 6.25-15 MG/5ML syrup Take 5 mLs by mouth 4 (four) times daily as needed for cough. 118 mL 0   simvastatin  (ZOCOR ) 20 MG tablet TAKE 1 TABLET BY MOUTH EVERY DAY AT 6PM 90 tablet 1   SOMA  350 MG tablet Take 1 tablet (350 mg total) by mouth 3 (three) times daily as needed for muscle spasms. 15 tablet 0   triamcinolone  (NASACORT ) 55 MCG/ACT AERO nasal inhaler      zonisamide  (ZONEGRAN ) 100  MG capsule Take 1 capsule (100 mg total) by mouth at bedtime. 30 capsule 3   Current Facility-Administered Medications  Medication Dose Route Frequency Provider Last Rate Last Admin   mepolizumab  (NUCALA ) injection 100 mg  100 mg Subcutaneous Q28 days Brian Campanile, MD   100 mg at 03/31/23 1523     Objective:  Psychiatric Specialty Exam: Physical Exam Constitutional:      Appearance: the patient is not toxic-appearing.  Pulmonary:     Effort: Pulmonary effort is normal.  Neurological:     General: No focal deficit present.     Mental Status: the patient is alert and oriented to person, place, and time.   Review of Systems  Respiratory:  Negative for shortness of breath.   Cardiovascular:  Negative for chest pain.  Gastrointestinal:  Negative for abdominal pain, constipation, diarrhea, nausea and vomiting.  Neurological:  Negative for headaches.      BP (!) 140/82   Wt 138 lb (62.6 kg)   BMI 26.95 kg/m   General Appearance: Fairly Groomed  Eye Contact:  Good  Speech:  Clear and Coherent  Volume:  Normal  Mood: Fine  Affect: appropriate, baseline  Thought Process:  Coherent  Orientation:  Full (Time, Place, and Person)  Thought Content: Logical   Suicidal Thoughts:  No  Homicidal Thoughts:  No  Memory:  Immediate;   Good  Judgement:  fair  Insight:  fair  Psychomotor Activity:  Normal  Concentration:  Concentration: Good  Recall:  Good  Fund of Knowledge: Good  Language: Good  Akathisia:  No  Handed:    AIMS (if indicated): not done  Assets:  Communication Skills Desire for Improvement Financial Resources/Insurance Housing Leisure Time Physical Health  ADL's:  Intact  Cognition: WNL  Sleep: Poor     Metabolic Disorder Labs: Lab Results  Component Value Date   HGBA1C 5.6 09/17/2021   No results found for: PROLACTIN Lab Results  Component Value Date   CHOL 161 09/30/2022   TRIG 91 09/30/2022   HDL 74 09/30/2022   CHOLHDL 2.2  09/30/2022   VLDL 22.2 06/22/2018   LDLCALC 70 09/30/2022   LDLCALC 90 09/17/2021   Lab Results  Component Value Date   TSH 2.380 01/08/2023   TSH 1.480 09/17/2021    Therapeutic Level Labs: No results found for: LITHIUM No results found for: VALPROATE No results found for: CBMZ  Screenings: GAD-7    Flowsheet Row Office Visit from 07/21/2023 in [facility] Office Visit from 07/01/2023 in [facility] Office Visit from 01/08/2023 in [facility] at [facility] Office Visit from 07/15/2022 in [facility] at [facility] Office Visit from 07/01/2022 in [facility] at [facility]  Total GAD-7 Score 0 5 6 7  0   Mini-Mental    Flowsheet Row Office Visit from 04/17/2020 in [facility] at [facility]  Total Score (max 30 points ) 30   PHQ2-9    Flowsheet Row Office Visit from 07/21/2023 in [place] [facility] Office Visit from 07/01/2023 in [facility] at [facility] Office Visit from 03/09/2023 in [facility] at [facility] Office Visit from 01/08/2023 in [facility] at [facility] Office Visit from 09/30/2022 in [facility] Primary Care at [facility]  PHQ-2 Total Score 0 1 1 4  0  PHQ-9 Total Score 0 -- 4 9 7    Flowsheet Row ED from 04/21/2023 in [facility] Emergency Department at [facility] UC from 11/03/2022 in [facility] at [facility] UC from 09/22/2022 in [facility]  C-SSRS RISK CATEGORY No Risk No Risk No Risk    Collaboration of Care: none  A total of 30 minutes was spent involved in face to face clinical care, chart review, documentation.   Marilou Showman, MD 08/13/2023, 5:35 PM

## 2023-08-14 NOTE — Addendum Note (Signed)
 Addended by: Donnelly Gainer on: 08/14/2023 10:31 AM   Modules accepted: Level of Service

## 2023-08-15 ENCOUNTER — Other Ambulatory Visit: Payer: Self-pay | Admitting: Allergy

## 2023-08-24 DIAGNOSIS — C44729 Squamous cell carcinoma of skin of left lower limb, including hip: Secondary | ICD-10-CM | POA: Diagnosis not present

## 2023-08-31 ENCOUNTER — Other Ambulatory Visit: Payer: Self-pay | Admitting: Allergy

## 2023-08-31 NOTE — Telephone Encounter (Signed)
 Courtesy refill for Ventolin  HFA and Singulair  10 mg x 1 with no refills sent to [facility].

## 2023-09-09 ENCOUNTER — Telehealth (HOSPITAL_COMMUNITY): Payer: Self-pay

## 2023-09-09 DIAGNOSIS — F5104 Psychophysiologic insomnia: Secondary | ICD-10-CM

## 2023-09-09 MED ORDER — HYDROXYZINE HCL 25 MG PO TABS: ORAL_TABLET | ORAL | 0 refills | Status: DC

## 2023-09-09 NOTE — Telephone Encounter (Signed)
 Fax received from pharmacy for a refill on Hydroxyzine , last filled on 6/11. Patient follows up with you on 7/21. [facility] [place]. Please review and advise, thank you

## 2023-09-12 ENCOUNTER — Other Ambulatory Visit: Payer: Self-pay | Admitting: Allergy

## 2023-09-16 ENCOUNTER — Ambulatory Visit (HOSPITAL_COMMUNITY): Admitting: Student in an Organized Health Care Education/Training Program

## 2023-09-16 DIAGNOSIS — C44729 Squamous cell carcinoma of skin of left lower limb, including hip: Secondary | ICD-10-CM | POA: Diagnosis not present

## 2023-09-16 DIAGNOSIS — S32010A Wedge compression fracture of first lumbar vertebra, initial encounter for closed fracture: Secondary | ICD-10-CM | POA: Diagnosis not present

## 2023-09-21 ENCOUNTER — Ambulatory Visit (HOSPITAL_COMMUNITY): Admitting: Student in an Organized Health Care Education/Training Program

## 2023-09-23 ENCOUNTER — Other Ambulatory Visit: Payer: Self-pay

## 2023-09-23 ENCOUNTER — Encounter: Payer: Self-pay | Admitting: Allergy

## 2023-09-23 ENCOUNTER — Ambulatory Visit: Admitting: Allergy

## 2023-09-23 ENCOUNTER — Other Ambulatory Visit: Payer: Self-pay | Admitting: Allergy

## 2023-09-23 VITALS — BP 130/88 | HR 70 | Temp 98.3°F | Resp 18 | Ht 59.45 in | Wt 130.0 lb

## 2023-09-23 DIAGNOSIS — J3089 Other allergic rhinitis: Secondary | ICD-10-CM | POA: Diagnosis not present

## 2023-09-23 DIAGNOSIS — K219 Gastro-esophageal reflux disease without esophagitis: Secondary | ICD-10-CM

## 2023-09-23 DIAGNOSIS — J329 Chronic sinusitis, unspecified: Secondary | ICD-10-CM

## 2023-09-23 DIAGNOSIS — J4550 Severe persistent asthma, uncomplicated: Secondary | ICD-10-CM | POA: Diagnosis not present

## 2023-09-23 DIAGNOSIS — H1013 Acute atopic conjunctivitis, bilateral: Secondary | ICD-10-CM | POA: Diagnosis not present

## 2023-09-23 MED ORDER — PROMETHAZINE-DM 6.25-15 MG/5ML PO SYRP: 5.0000 mL | ORAL_SOLUTION | Freq: Four times a day (QID) | ORAL | 0 refills | Status: AC | PRN

## 2023-09-23 NOTE — Progress Notes (Signed)
 Follow-up Note  RE: [name] MRN: [id number] DOB: 12-14-1965 Date of Office Visit: 09/23/2023   History of present illness: [name] is a 58 y.o. female presenting today for follow-up of allergic rhinosinusitis with conjunctivitis, asthma, and reflux.  She was last seen in the office on 10/22/22 by myself.  [name] has had continued nasal congestion, sinus pressure, drainage to her throat, and significant wheezing and coughing. At this time she has a sore throat and vocal hoarseness with fatigue, which is common for her. She often wakes up due to her breathing despite using trelegy 1-2 times a day in conjunction with singulair , xyzal , pataday /visine eye drops, fluticasone , ipratropium, and nucala  monthly injection. However she notes that she has not been consistently receiving her nucala  injections due to person social circumstances and plans to restart soon.  Last injection was in February.   Over the past few months, she has had some breakthrough worsening of her symptoms during which she feels ill and produces thicker, darker mucus. At these times she uses her albuterol  inhaler, which helps some. She is not able to afford physician visits during these episodes.  Review of systems: Review of Systems  Constitutional:  Positive for fatigue.  HENT:  Positive for congestion, postnasal drip, rhinorrhea, sinus pressure, sinus pain, sore throat and voice change.   Eyes:  Positive for itching.  Respiratory:  Positive for cough, shortness of breath and wheezing.      All other systems negative unless noted above in HPI  Past medical/social/surgical/family history have been reviewed and are unchanged unless specifically indicated below.  No changes  Medication List: Current Outpatient Medications  Medication Sig Dispense Refill   albuterol  (VENTOLIN  HFA) 108 (90 Base) MCG/ACT inhaler TAKE 2 PUFFS BY MOUTH EVERY 4 TO 6 HOURS AS NEEDED 18 each 0   aspirin 81 MG tablet Take 81 mg by  mouth daily.     azithromycin  (ZITHROMAX ) 250 MG tablet Take 2 tabs today, then take 1 tab daily until gone. 6 tablet 0   benzonatate  (TESSALON ) 100 MG capsule Take 1 capsule (100 mg total) by mouth 2 (two) times daily as needed for cough. 20 capsule 0   buPROPion  (WELLBUTRIN  XL) 300 MG 24 hr tablet Take 1 tablet (300 mg total) by mouth daily. 30 tablet 3   Calcium  Carb-Cholecalciferol (OYSTER SHELL CALCIUM  W/D) 500-5 MG-MCG TABS TAKE 1 TABLET BY MOUTH THREE TIMES A DAY 90 tablet 6   calcium -vitamin D  (OSCAL WITH D) 500-200 MG-UNIT tablet Take 1 tablet by mouth 3 (three) times daily. 90 tablet 6   cyclobenzaprine  (FLEXERIL ) 10 MG tablet Take 10 mg by mouth 2 (two) times daily as needed.     fluticasone  (FLONASE ) 50 MCG/ACT nasal spray PLACE 2 SPRAYS INTO BOTH NOSTRILS DAILY AS NEEDED FOR ALLERGIES OR RHINITIS. 16 mL 0   gabapentin  (NEURONTIN ) 300 MG capsule Take 1 capsule by mouth 3 (three) times daily.     HYDROcodone  bit-homatropine (HYCODAN) 5-1.5 MG/5ML syrup Take 5 mLs by mouth every 6 (six) hours as needed for cough. 120 mL 0   hydrOXYzine  (ATARAX ) 25 MG tablet Take 1-2 tabs PO at bedtime prn 30 tablet 0   ipratropium (ATROVENT ) 0.03 % nasal spray 2 SPRAYS EACH NOSTRIL TWICE A DAY AS NEEDED FOR RUNNY NOSE/DRAINAGE DOWN THROAT. 30 mL 5   levocetirizine (XYZAL ) 5 MG tablet TAKE 1 TABLET EVERY DAY AS NEEDED 30 tablet 0   lidocaine  (LIDODERM ) 5 % Place 1 patch onto the skin  every 12 (twelve) hours. Remove & Discard patch within 12 hours or as directed by MD 10 patch 0   mepolizumab  (NUCALA ) 100 MG/ML SOSY Inject 100 mg into the skin every 28 (twenty-eight) days. 1 mL 11   montelukast  (SINGULAIR ) 10 MG tablet TAKE 1 TABLET BY MOUTH EVERYDAY AT BEDTIME 30 tablet 0   morphine (MSIR) 15 MG tablet Take 15 mg by mouth every 4 (four) hours as needed for severe pain.     pantoprazole  (PROTONIX ) 40 MG tablet Take 1 tablet (40 mg total) by mouth daily. 30 tablet 0   predniSONE  (DELTASONE ) 10 MG tablet  6,5,4,3,2,1 take each days dose all at once with food 21 tablet 0   promethazine  (PHENERGAN ) 25 MG tablet Take 1 tablet (25 mg total) by mouth every 8 (eight) hours as needed for nausea or vomiting. 20 tablet 0   simvastatin  (ZOCOR ) 20 MG tablet TAKE 1 TABLET BY MOUTH EVERY DAY AT 6PM 90 tablet 1   SOMA  350 MG tablet Take 1 tablet (350 mg total) by mouth 3 (three) times daily as needed for muscle spasms. 15 tablet 0   TRELEGY ELLIPTA  200-62.5-25 MCG/ACT AEPB TAKE 1 PUFF BY MOUTH EVERY DAY 60 each 0   triamcinolone  (NASACORT ) 55 MCG/ACT AERO nasal inhaler      zonisamide  (ZONEGRAN ) 100 MG capsule Take 1 capsule (100 mg total) by mouth at bedtime. 30 capsule 3   promethazine -dextromethorphan (PROMETHAZINE -DM) 6.25-15 MG/5ML syrup Take 5 mLs by mouth 4 (four) times daily as needed for cough. 118 mL 0   Current Facility-Administered Medications  Medication Dose Route Frequency Provider Last Rate Last Admin   mepolizumab  (NUCALA ) injection 100 mg  100 mg Subcutaneous Q28 days Jeneal Danita Macintosh, MD   100 mg at 03/31/23 1523     Known medication allergies: Allergies  Allergen Reactions   Other    Effexor [Venlafaxine] Other (See Comments)    UNSPECIFIED REACTION, headaches, felt funny, withdrawal with missed dose    Lamotrigine Rash and Other (See Comments)     Physical examination: Blood pressure 130/88, pulse 70, temperature 98.3 F (36.8 C), temperature source Temporal, resp. rate 18, height 4' 11.45 (1.51 m), weight 130 lb (59 kg), SpO2 100%.  General: Alert, interactive, in no acute distress. HEENT: PERRLA, TMs pearly gray, turbinates edematous without discharge, post-pharynx non erythematous. Neck: Supple without lymphadenopathy. Lungs: Diffuse inspiratory and expiratory wheezing and crackles that induces coughing. {no increased work of breathing. CV: Normal S1, S2 without murmurs. Abdomen: Nondistended, nontender. Skin: Warm and dry, without lesions or rashes. Extremities:   No clubbing, cyanosis or edema. Neuro:   Grossly intact.  Diagnositics/Labs: Labs: None  Spirometry: FEV1: 2.59L, 121%, FVC: 3.09L, 116%, ratio consistent with normal airway function  Assessment and plan: Allergic rhinitis with conjunctivitis (grass pollen, weed pollen, molds, and mouse) Continue Singulair  10 mg once a day Continue Xyzal  5 mg once a day as needed Continue Pataday  1 drop each eye once a day as needed for itchy watery eyes Continue ipratropium bromide  nasal spray using 2 sprays each nostril twice a day as needed for runny nose/drainage down throat Continue fluticasone  nasal spray 2 sprays each nostril once a day for stuffy nose. May use saline nasal rinse as needed for nasal symptoms. Use this prior to any medicated nasal spray.  Asthma  Continue Trelegy 1 puff daily Perform flutter valve several times a day to help move mucus from airway once you locate it from storage.   Start Tezspire (Tezepelumab) injections  and stop Nucala  injections. [place] has more potential to impact and improve both lower airway symptoms and upper airway (sinus) symptoms Continue Singulair  10 mg as above May use Promethazine -DM 5 mL's up to 4 times daily for cough with mucus production. Provided prednisone  20mg  daily x 5 day course May use albuterol  2 puffs every 4 hours as needed for cough, wheeze, tightness in chest, or shortness of breath. Also, may use albuterol  2 puffs 5-15 minutes prior to exercise  Chronic rhinosinusitis  Strep pneumonia titers are better but still does not have great protection.  Now has 35% protective rate up from 22% previously.   Let see how you do with infections and if you still struggle with viral or bacterial illnesses then consider immunoglobulin replacement therapy.  Hopeful sinus symptoms will improve with Tezspire.   Reflux Use pantoprazole  daily for reflux control Continue dietary and lifestyle modifications as below  Follow-up in 3-4 months or sooner  if needed  I appreciate the opportunity to take part in Alveta's care. Please do not hesitate to contact me with questions. I agree with the history and physical obtained by TERRIS Mutter and plan discussed with MS4 Garr.   Sincerely,  Donnice Mutter, MS4 [employer] [employer]  Danita Brain, MD Allergy/Immunology [facility]

## 2023-09-23 NOTE — Patient Instructions (Addendum)
 Allergic rhinitis with conjunctivitis (grass pollen, weed pollen, molds, and mouse) Continue Singulair  10 mg once a day Continue Xyzal  5 mg once a day as needed Continue Pataday  1 drop each eye once a day as needed for itchy watery eyes Continue ipratropium bromide  nasal spray using 2 sprays each nostril twice a day as needed for runny nose/drainage down throat Continue fluticasone  nasal spray 2 sprays each nostril once a day for stuffy nose. May use saline nasal rinse as needed for nasal symptoms. Use this prior to any medicated nasal spray.  Asthma  Continue Trelegy 1 puff daily Perform flutter valve several times a day to help move mucus from airway once you locate it from storage.   Start Tezspire (Tezepelumab) injections and stop Nucala  injections. [name] has more potential to impact and improve both lower airway symptoms and upper airway (sinus) symptoms Continue Singulair  10 mg as above May use Promethazine -DM 5 mL's up to 4 times daily for cough with mucus production. Provided prednisone  20mg  daily x 5 day course May use albuterol  2 puffs every 4 hours as needed for cough, wheeze, tightness in chest, or shortness of breath. Also, may use albuterol  2 puffs 5-15 minutes prior to exercise  Chronic rhinosinusitis  Strep pneumonia titers are better but still does not have great protection.  Now has 35% protective rate up from 22% previously.   Let see how you do with infections and if you still struggle with viral or bacterial illnesses then consider immunoglobulin replacement therapy.  Hopeful sinus symptoms will improve with Tezspire.   Reflux Use pantoprazole  daily for reflux control Continue dietary and lifestyle modifications as below  Follow-up in 3-4 months or sooner if needed

## 2023-09-27 ENCOUNTER — Other Ambulatory Visit: Payer: Self-pay | Admitting: Allergy

## 2023-10-05 ENCOUNTER — Ambulatory Visit (HOSPITAL_COMMUNITY): Admitting: Student in an Organized Health Care Education/Training Program

## 2023-10-13 NOTE — Progress Notes (Signed)
 BH MD Outpatient Progress Note  10/14/2023 2:31 PM [name]  MRN:  [id number]  Assessment:  [name] presents for follow-up evaluation.    Patient seen for follow-up evaluation, reporting worsening depression and anxiety in the context of approaching anniversaries of her deceased parents' deaths and birthdays. She notes increased social isolation but denies any acute safety concerns.  Grief counseling was discussed again. Patient was encouraged to connect with therapy; resources were provided, and she is open to pursuing this.Patient remains hesitant to trial new medications but is amenable to titration of hydroxyzine , which was modified to 50 mg TID PRN, as she has found it helpful for sleep. Will continue current regimen of bupropion  and zonisamide  without changes.  Agree with the prior recommendation for mood stabilization; however, the patient has been historically and remains currently resistant to this intervention.  Blood pressure today was 166/105, and records indicate consistently elevated readings this past year. She is not currently taking antihypertensive medication and was advised to follow up with her PCP regarding blood pressure management.  Identifying Information: [name] is a 58 y.o. y.o. 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   Plan:  # Bipolar affective disorder, current episode depressed # R/o prolonged grief disorder -- Continue Wellbutrin  300 mg XL daily - Continue zonisamide  100 mg nightly - Ideally the patient would be on a better mood stabilizing medication, however, and hypomanic symptomatology is currently limited - Increase atarax  to 50 mg TID PRN for anxiety and sleep  -  It appears the patient restarted Soma  to help with pain  # Cannabis use disorder, moderate, dependence Interventions: -- Continue to encourage reduction use or abstinence  Patient was given contact information for behavioral health clinic and was instructed to call 911 for emergencies.   Subjective:  Chief Complaint:  Chief Complaint  Patient presents with   Follow-up   Depression   Anxiety    Interval History:  The patient reports worsening depression and anxiety since her last visit, which she attributes to the approaching anniversary of her parents' birthdays. Both parents are deceased, with her father passing away in January 2024. She describes increased social isolation, feeling that some of her friendships have become burdensome, as she typically plays a supportive role but does not feel able to do so at this time. Additional chronic stressors include her ongoing medical illnesses and recovery from a recent viral illness. She has also experienced recent worries about having a heart attack due to a history of chest pain, though a cardiology workup earlier this year was unremarkable. She notes that her limited mobility has contributed to her worsening depression, as she usually enjoys playing sports but has not been able to participate.  In terms of support, she identifies her dogs, her spirituality, and a friend as protective and mitigating factors. She reports that her sleep has improved with as-needed hydroxyzine , though she had been taking 50 mg at night instead of the originally prescribed 25 mg. She was amenable to adjusting this to 25 mg three times a day as needed to also address anxiety.  She denies any suicidal ideation or thoughts of wanting to join her parents in death. Therapy was discussed, and grief counseling was recommended, to which  she is open. The patient confirms compliance with her psychotropic medications. The attending physician was present for the end of the  assessment, and it was recommended that she follow up with her PCP for persistently elevated blood pressure noted at home and on chart review    Visit Diagnosis:    ICD-10-CM   1. Psychophysiological insomnia  F51.04 hydrOXYzine  (ATARAX ) 50 MG tablet    2. 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  The patient reports that she has not attempted suicide in several decades  Past Medical History:  Past Medical History:  Diagnosis Date   Anxiety    Arthritis    neck, knees, shoulders   Asthma    Bipolar disorder (HCC)    currently feeling MANIC- 10/02/2016   Depression    GERD (gastroesophageal reflux disease)    Hip fracture (HCC) 06/2019   History of blood transfusion    as a newborn    History of lump of left breast    Hyperlipidemia    Lumbar pseudoarthrosis    Motion sickness    cars   OSA (obstructive sleep apnea) 01/09/2016   can't afford CPAP   Personality disorder (HCC)    PONV (postoperative nausea and vomiting)    Post traumatic stress disorder (PTSD)    Substance abuse (HCC)    Synovial cyst     Past Surgical History:  Procedure Laterality Date   COLONOSCOPY WITH PROPOFOL  N/A 11/04/2017   Procedure: COLONOSCOPY WITH PROPOFOL ;  Surgeon: Unk Corinn Skiff, MD;  Location: [facility];  Service: Endoscopy;  Laterality: N/A;   ESOPHAGOGASTRODUODENOSCOPY (EGD) WITH PROPOFOL  N/A 11/04/2017   Procedure: ESOPHAGOGASTRODUODENOSCOPY (EGD) WITH PROPOFOL  with biopsies;  Surgeon: Unk Corinn Skiff, MD;  Location: [facility];  Service: Endoscopy;  Laterality: N/A;  sleep apnea   KNEE SURGERY Left    x5, post basketball injury   LUMBAR LAMINECTOMY/DECOMPRESSION MICRODISCECTOMY Left 01/22/2016   Procedure: Laminectomy for  facet/synovial cyst - left - Lumbar four - lumbar five;  Surgeon: Victory Gunnels, MD;  Location: [facility] OR;  Service: Neurosurgery;  Laterality: Left;  Laminectomy for facet/synovial cyst - left - Lumbar four - lumbar five   POLYPECTOMY N/A 11/04/2017   Procedure: POLYPECTOMY INTESTINAL;  Surgeon: Unk Corinn Skiff, MD;  Location: [facility];  Service: Endoscopy;  Laterality: N/A;   SHOULDER SURGERY Left    x2   SPINE SURGERY N/A    [employer]reesia 08/07/2019   TOE SURGERY Bilateral    bone spurs    Family Psychiatric History: None pertinent  Family History:  Family History  Problem Relation Age of Onset   Heart disease Father    Hyperlipidemia Father    Alcohol abuse Brother    Alcohol abuse Paternal Uncle    Breast cancer Maternal Aunt        70's   Colon cancer Neg Hx     Social History:  Social History   Socioeconomic History   Marital status: Single    Spouse name: Not on file   Number of children: 0   Years of education: Not on file   Highest education level: Bachelor's degree (e.g., BA, AB, BS)  Occupational History   Not on file  Tobacco Use   Smoking status: Former  Current packs/day: 0.00    Average packs/day: 1 pack/day for 1 year (1.0 ttl pk-yrs)    Types: Cigarettes    Start date: 04/16/1995    Quit date: 04/15/1996    Years since quitting: 27.5    Passive exposure: Past   Smokeless tobacco: Never   Tobacco comments:    started back again in 2016, smoked for about 6 mo and then quit  Vaping Use   Vaping status: Never Used  Substance and Sexual Activity   Alcohol use: No    Alcohol/week: 0.0 standard drinks of alcohol    Comment: quit 20 years   Drug use: Yes    Frequency: 14.0 times per week    Types: Marijuana    Comment: 20 yrs. ago- cocaine    Sexual activity: Not Currently  Other Topics Concern   Not on file  Social History Narrative   Not on file   Social Drivers of Health   Financial Resource Strain: Low Risk  (07/01/2023)   Overall  Financial Resource Strain (CARDIA)    Difficulty of Paying Living Expenses: Not hard at all  Food Insecurity: No Food Insecurity (07/01/2023)   Hunger Vital Sign    Worried About Running Out of Food in the Last Year: Never true    Ran Out of Food in the Last Year: Never true  Transportation Needs: No Transportation Needs (07/01/2023)   PRAPARE - Administrator, Civil Service (Medical): No    Lack of Transportation (Non-Medical): No  Physical Activity: Insufficiently Active (03/09/2023)   Exercise Vital Sign    Days of Exercise per Week: 4 days    Minutes of Exercise per Session: 30 min  Stress: No Stress Concern Present (07/01/2023)   [employer] of Occupational Health - Occupational Stress Questionnaire    Feeling of Stress : Only a little  Social Connections: Socially Isolated (07/01/2023)   Social Connection and Isolation Panel    Frequency of Communication with Friends and Family: More than three times a week    Frequency of Social Gatherings with Friends and Family: Three times a week    Attends Religious Services: Never    Active Member of Clubs or Organizations: No    Attends Banker Meetings: Never    Marital Status: Never married    Allergies:  Allergies  Allergen Reactions   Other    Effexor [Venlafaxine] Other (See Comments)    UNSPECIFIED REACTION, headaches, felt funny, withdrawal with missed dose    Lamotrigine Rash and Other (See Comments)    Current Medications: Current Outpatient Medications  Medication Sig Dispense Refill   albuterol  (VENTOLIN  HFA) 108 (90 Base) MCG/ACT inhaler TAKE 2 PUFFS BY MOUTH EVERY 4 TO 6 HOURS AS NEEDED 18 each 0   aspirin 81 MG tablet Take 81 mg by mouth daily.     buPROPion  (WELLBUTRIN  XL) 300 MG 24 hr tablet Take 1 tablet (300 mg total) by mouth daily. 30 tablet 3   fluticasone  (FLONASE ) 50 MCG/ACT nasal spray PLACE 2 SPRAYS INTO BOTH NOSTRILS DAILY AS NEEDED FOR ALLERGIES OR RHINITIS. 16 mL 0    Fluticasone -Umeclidin-Vilant (TRELEGY ELLIPTA ) 200-62.5-25 MCG/ACT AEPB INHALE 1 PUFF BY MOUTH EVERY DAY 60 each 1   gabapentin  (NEURONTIN ) 300 MG capsule Take 1 capsule by mouth 3 (three) times daily.     ipratropium (ATROVENT ) 0.03 % nasal spray 2 SPRAYS EACH NOSTRIL TWICE A DAY AS NEEDED FOR RUNNY NOSE/DRAINAGE DOWN THROAT. 30 mL 5   levocetirizine (  XYZAL ) 5 MG tablet TAKE 1 TABLET EVERY DAY AS NEEDED 30 tablet 0   montelukast  (SINGULAIR ) 10 MG tablet TAKE 1 TABLET BY MOUTH EVERYDAY AT BEDTIME 30 tablet 2   morphine (MSIR) 15 MG tablet Take 15 mg by mouth every 4 (four) hours as needed for severe pain.     pantoprazole  (PROTONIX ) 40 MG tablet Take 1 tablet (40 mg total) by mouth daily. 30 tablet 0   promethazine  (PHENERGAN ) 25 MG tablet Take 1 tablet (25 mg total) by mouth every 8 (eight) hours as needed for nausea or vomiting. 20 tablet 0   simvastatin  (ZOCOR ) 20 MG tablet TAKE 1 TABLET BY MOUTH EVERY DAY AT 6PM 90 tablet 1   SOMA  350 MG tablet Take 1 tablet (350 mg total) by mouth 3 (three) times daily as needed for muscle spasms. 15 tablet 0   triamcinolone  (NASACORT ) 55 MCG/ACT AERO nasal inhaler      zonisamide  (ZONEGRAN ) 100 MG capsule Take 1 capsule (100 mg total) by mouth at bedtime. 30 capsule 3   azithromycin  (ZITHROMAX ) 250 MG tablet Take 2 tabs today, then take 1 tab daily until gone. (Patient not taking: Reported on 10/14/2023) 6 tablet 0   benzonatate  (TESSALON ) 100 MG capsule Take 1 capsule (100 mg total) by mouth 2 (two) times daily as needed for cough. (Patient not taking: Reported on 10/14/2023) 20 capsule 0   Calcium  Carb-Cholecalciferol (OYSTER SHELL CALCIUM  W/D) 500-5 MG-MCG TABS TAKE 1 TABLET BY MOUTH THREE TIMES A DAY (Patient not taking: Reported on 10/14/2023) 90 tablet 6   calcium -vitamin D  (OSCAL WITH D) 500-200 MG-UNIT tablet Take 1 tablet by mouth 3 (three) times daily. (Patient not taking: Reported on 10/14/2023) 90 tablet 6   cyclobenzaprine  (FLEXERIL ) 10 MG tablet Take  10 mg by mouth 2 (two) times daily as needed. (Patient not taking: Reported on 10/14/2023)     HYDROcodone  bit-homatropine (HYCODAN) 5-1.5 MG/5ML syrup Take 5 mLs by mouth every 6 (six) hours as needed for cough. (Patient not taking: Reported on 10/14/2023) 120 mL 0   hydrOXYzine  (ATARAX ) 50 MG tablet Take 1 tablet (50 mg total) by mouth 3 (three) times daily as needed for anxiety. Take 1-2 tabs PO at bedtime prn 90 tablet 2   lidocaine  (LIDODERM ) 5 % Place 1 patch onto the skin every 12 (twelve) hours. Remove & Discard patch within 12 hours or as directed by MD (Patient not taking: Reported on 10/14/2023) 10 patch 0   mepolizumab  (NUCALA ) 100 MG/ML SOSY Inject 100 mg into the skin every 28 (twenty-eight) days. (Patient not taking: Reported on 10/14/2023) 1 mL 11   predniSONE  (DELTASONE ) 10 MG tablet 6,5,4,3,2,1 take each days dose all at once with food (Patient not taking: Reported on 10/14/2023) 21 tablet 0   promethazine -dextromethorphan (PROMETHAZINE -DM) 6.25-15 MG/5ML syrup Take 5 mLs by mouth 4 (four) times daily as needed for cough. (Patient not taking: Reported on 10/14/2023) 118 mL 0   Current Facility-Administered Medications  Medication Dose Route Frequency Provider Last Rate Last Admin   mepolizumab  (NUCALA ) injection 100 mg  100 mg Subcutaneous Q28 days Jeneal Danita Macintosh, MD   100 mg at 03/31/23 1523     Objective:  Psychiatric Specialty Exam: Physical Exam Constitutional:      Appearance: the patient is not toxic-appearing.  Pulmonary:     Effort: Pulmonary effort is normal.  Neurological:     General: No focal deficit present.     Mental Status: the patient is alert and oriented to  person, place, and time.   Review of Systems  Respiratory:  Negative for shortness of breath.   Cardiovascular:  Negative for chest pain.  Gastrointestinal:  Negative for abdominal pain, constipation, diarrhea, nausea and vomiting.  Neurological:  Negative for headaches.      BP (!)  166/105   Pulse 82   Ht 5' (1.524 m)   Wt 126 lb 3.2 oz (57.2 kg)   BMI 24.65 kg/m   General Appearance: Fairly Groomed  Eye Contact:  Good  Speech:  Clear and Coherent  Volume:  Normal  Mood: depressed  Affect: appropriate, tearful, depressed  Thought Process:  Coherent  Orientation:  Full (Time, Place, and Person)  Thought Content: Logical   Suicidal Thoughts:  No  Homicidal Thoughts:  No  Memory:  Immediate;   Good  Judgement:  fair  Insight:  fair  Psychomotor Activity:  Normal  Concentration:  Concentration: Good  Recall:  Good  Fund of Knowledge: Good  Language: Good  Akathisia:  No  Handed:    AIMS (if indicated): not done  Assets:  Communication Skills Desire for Improvement Financial Resources/Insurance Housing Leisure Time Physical Health  ADL's:  Intact  Cognition: WNL  Sleep: Poor     Metabolic Disorder Labs: Lab Results  Component Value Date   HGBA1C 5.6 09/17/2021   No results found for: PROLACTIN Lab Results  Component Value Date   CHOL 161 09/30/2022   TRIG 91 09/30/2022   HDL 74 09/30/2022   CHOLHDL 2.2 09/30/2022   VLDL 77.7 06/22/2018   LDLCALC 70 09/30/2022   LDLCALC 90 09/17/2021   Lab Results  Component Value Date   TSH 2.380 01/08/2023   TSH 1.480 09/17/2021    Therapeutic Level Labs: No results found for: LITHIUM No results found for: VALPROATE No results found for: CBMZ  Screenings: GAD-7    Flowsheet Row [facility] Office Visit from 07/21/2023 in [place] [facility] Office Visit from 07/01/2023 in [facility] Office Visit from 01/08/2023 in [facility] at [facility] Office Visit from 07/15/2022 in [facility]  Total GAD-7 Score 11 0 5 6 7    Mini-Mental    Flowsheet Row Office Visit from 04/17/2020 in [facility] at [facility]  Total Score (max  30 points ) 30   PHQ2-9    Flowsheet Row Office Visit from 10/14/2023 in [facility] Office Visit from 07/21/2023 in [place] [facility] Office Visit from 07/01/2023 in [facility] Office Visit from 03/09/2023 in [facility] at [facility] Office Visit from 01/08/2023 in [facility] at [facility]  PHQ-2 Total Score 3 0 1 1 4   PHQ-9 Total Score 16 0 -- 4 9   Flowsheet Row ED from 04/21/2023 in [place] [facility] UC from 11/03/2022 in [facility] UC from 09/22/2022 in [facility])  C-SSRS RISK CATEGORY No Risk No Risk No Risk    Collaboration of Care: none  A total of 30 minutes was spent involved in face to face clinical care, chart review, documentation.   Marlo Masson, MD 10/14/2023, 2:31 PM

## 2023-10-14 ENCOUNTER — Encounter (HOSPITAL_COMMUNITY): Payer: Self-pay | Admitting: Student in an Organized Health Care Education/Training Program

## 2023-10-14 ENCOUNTER — Ambulatory Visit (HOSPITAL_COMMUNITY): Admitting: Student in an Organized Health Care Education/Training Program

## 2023-10-14 DIAGNOSIS — Z08 Encounter for follow-up examination after completed treatment for malignant neoplasm: Secondary | ICD-10-CM | POA: Diagnosis not present

## 2023-10-14 DIAGNOSIS — F3170 Bipolar disorder, currently in remission, most recent episode unspecified: Secondary | ICD-10-CM | POA: Diagnosis not present

## 2023-10-14 DIAGNOSIS — Z85828 Personal history of other malignant neoplasm of skin: Secondary | ICD-10-CM | POA: Diagnosis not present

## 2023-10-14 DIAGNOSIS — F5104 Psychophysiologic insomnia: Secondary | ICD-10-CM | POA: Diagnosis not present

## 2023-10-14 MED ORDER — HYDROXYZINE HCL 50 MG PO TABS: 50.0000 mg | ORAL_TABLET | Freq: Three times a day (TID) | ORAL | 2 refills | Status: DC | PRN

## 2023-10-14 NOTE — Addendum Note (Signed)
 Addended by: CARVIN CROCK on: 10/14/2023 03:13 PM   Modules accepted: Level of Service

## 2023-10-27 ENCOUNTER — Ambulatory Visit (INDEPENDENT_AMBULATORY_CARE_PROVIDER_SITE_OTHER): Admitting: Family Medicine

## 2023-10-27 ENCOUNTER — Encounter: Payer: Self-pay | Admitting: Family Medicine

## 2023-10-27 VITALS — BP 154/101 | HR 79 | Ht 60.0 in | Wt 121.8 lb

## 2023-10-27 DIAGNOSIS — J209 Acute bronchitis, unspecified: Secondary | ICD-10-CM

## 2023-10-27 DIAGNOSIS — I10 Essential (primary) hypertension: Secondary | ICD-10-CM

## 2023-10-27 MED ORDER — HYDROCODONE BIT-HOMATROP MBR 5-1.5 MG/5ML PO SOLN: 5.0000 mL | Freq: Four times a day (QID) | ORAL | 0 refills | Status: AC | PRN

## 2023-10-27 MED ORDER — LOSARTAN POTASSIUM 50 MG PO TABS: 50.0000 mg | ORAL_TABLET | Freq: Every day | ORAL | 0 refills | Status: DC

## 2023-10-27 MED ORDER — AZITHROMYCIN 250 MG PO TABS: ORAL_TABLET | ORAL | 0 refills | Status: AC

## 2023-10-27 NOTE — Progress Notes (Signed)
 Established Patient Office Visit  Subjective    Patient ID: [name], female    DOB: 28-Oct-1965  Age: 58 y.o. MRN: [id number]  CC:  Chief Complaint  Patient presents with   chest congestion     Pt reports chest congestion that has been going on for about a month     HPI [name] presents with cough and chest congestion that is now minimally productive of brownish sputum. She denies fever/chills. Patient is also concerned about recent elevated blood pressures.   Outpatient Encounter Medications as of 10/27/2023  Medication Sig   albuterol  (VENTOLIN  HFA) 108 (90 Base) MCG/ACT inhaler TAKE 2 PUFFS BY MOUTH EVERY 4 TO 6 HOURS AS NEEDED   aspirin 81 MG tablet Take 81 mg by mouth daily.   buPROPion  (WELLBUTRIN  XL) 300 MG 24 hr tablet Take 1 tablet (300 mg total) by mouth daily.   Calcium  Carb-Cholecalciferol (OYSTER SHELL CALCIUM  W/D) 500-5 MG-MCG TABS TAKE 1 TABLET BY MOUTH THREE TIMES A DAY   calcium -vitamin D  (OSCAL WITH D) 500-200 MG-UNIT tablet Take 1 tablet by mouth 3 (three) times daily.   cyclobenzaprine  (FLEXERIL ) 10 MG tablet Take 10 mg by mouth 2 (two) times daily as needed.   fluticasone  (FLONASE ) 50 MCG/ACT nasal spray PLACE 2 SPRAYS INTO BOTH NOSTRILS DAILY AS NEEDED FOR ALLERGIES OR RHINITIS.   Fluticasone -Umeclidin-Vilant (TRELEGY ELLIPTA ) 200-62.5-25 MCG/ACT AEPB INHALE 1 PUFF BY MOUTH EVERY DAY   gabapentin  (NEURONTIN ) 300 MG capsule Take 1 capsule by mouth 3 (three) times daily.   hydrOXYzine  (ATARAX ) 50 MG tablet Take 1 tablet (50 mg total) by mouth 3 (three) times daily as needed for anxiety. Take 1-2 tabs PO at bedtime prn   ipratropium (ATROVENT ) 0.03 % nasal spray 2 SPRAYS EACH NOSTRIL TWICE A DAY AS NEEDED FOR RUNNY NOSE/DRAINAGE DOWN THROAT.   levocetirizine (XYZAL ) 5 MG tablet TAKE 1 TABLET EVERY DAY AS NEEDED   lidocaine  (LIDODERM ) 5 % Place 1 patch onto the skin every 12 (twelve) hours. Remove & Discard patch within 12 hours or as directed by MD    losartan  (COZAAR ) 50 MG tablet Take 1 tablet (50 mg total) by mouth daily.   mepolizumab  (NUCALA ) 100 MG/ML SOSY Inject 100 mg into the skin every 28 (twenty-eight) days.   montelukast  (SINGULAIR ) 10 MG tablet TAKE 1 TABLET BY MOUTH EVERYDAY AT BEDTIME   morphine (MSIR) 15 MG tablet Take 15 mg by mouth every 4 (four) hours as needed for severe pain.   pantoprazole  (PROTONIX ) 40 MG tablet Take 1 tablet (40 mg total) by mouth daily.   predniSONE  (DELTASONE ) 10 MG tablet 6,5,4,3,2,1 take each days dose all at once with food   promethazine  (PHENERGAN ) 25 MG tablet Take 1 tablet (25 mg total) by mouth every 8 (eight) hours as needed for nausea or vomiting.   promethazine -dextromethorphan (PROMETHAZINE -DM) 6.25-15 MG/5ML syrup Take 5 mLs by mouth 4 (four) times daily as needed for cough.   simvastatin  (ZOCOR ) 20 MG tablet TAKE 1 TABLET BY MOUTH EVERY DAY AT 6PM   SOMA  350 MG tablet Take 1 tablet (350 mg total) by mouth 3 (three) times daily as needed for muscle spasms.   triamcinolone  (NASACORT ) 55 MCG/ACT AERO nasal inhaler    zonisamide  (ZONEGRAN ) 100 MG capsule Take 1 capsule (100 mg total) by mouth at bedtime.   [DISCONTINUED] HYDROcodone  bit-homatropine (HYCODAN) 5-1.5 MG/5ML syrup Take 5 mLs by mouth every 6 (six) hours as needed for cough.   azithromycin  (ZITHROMAX ) 250 MG  tablet Take 2 tabs today, then take 1 tab daily until gone.   benzonatate  (TESSALON ) 100 MG capsule Take 1 capsule (100 mg total) by mouth 2 (two) times daily as needed for cough. (Patient not taking: Reported on 10/27/2023)   HYDROcodone  bit-homatropine (HYCODAN) 5-1.5 MG/5ML syrup Take 5 mLs by mouth every 6 (six) hours as needed for cough.   [DISCONTINUED] azithromycin  (ZITHROMAX ) 250 MG tablet Take 2 tabs today, then take 1 tab daily until gone. (Patient not taking: Reported on 10/27/2023)   Facility-Administered Encounter Medications as of 10/27/2023  Medication   mepolizumab  (NUCALA ) injection 100 mg    Past Medical  History:  Diagnosis Date   Anxiety    Arthritis    neck, knees, shoulders   Asthma    Bipolar disorder (HCC)    currently feeling MANIC- 10/02/2016   Depression    GERD (gastroesophageal reflux disease)    Hip fracture (HCC) 06/2019   History of blood transfusion    as a newborn    History of lump of left breast    Hyperlipidemia    Lumbar pseudoarthrosis    Motion sickness    cars   OSA (obstructive sleep apnea) 01/09/2016   can't afford CPAP   Personality disorder (HCC)    PONV (postoperative nausea and vomiting)    Post traumatic stress disorder (PTSD)    Substance abuse (HCC)    Synovial cyst     Past Surgical History:  Procedure Laterality Date   COLONOSCOPY WITH PROPOFOL  N/A 11/04/2017   Procedure: COLONOSCOPY WITH PROPOFOL ;  Surgeon: Unk Corinn Skiff, MD;  Location: [facility];  Service: Endoscopy;  Laterality: N/A;   ESOPHAGOGASTRODUODENOSCOPY (EGD) WITH PROPOFOL  N/A 11/04/2017   Procedure: ESOPHAGOGASTRODUODENOSCOPY (EGD) WITH PROPOFOL  with biopsies;  Surgeon: Unk Corinn Skiff, MD;  Location: [facility];  Service: Endoscopy;  Laterality: N/A;  sleep apnea   KNEE SURGERY Left    x5, post basketball injury   LUMBAR LAMINECTOMY/DECOMPRESSION MICRODISCECTOMY Left 01/22/2016   Procedure: Laminectomy for facet/synovial cyst - left - Lumbar four - lumbar five;  Surgeon: Victory Gunnels, MD;  Location: [facility];  Service: Neurosurgery;  Laterality: Left;  Laminectomy for facet/synovial cyst - left - Lumbar four - lumbar five   POLYPECTOMY N/A 11/04/2017   Procedure: POLYPECTOMY INTESTINAL;  Surgeon: Unk Corinn Skiff, MD;  Location: [facility];  Service: Endoscopy;  Laterality: N/A;   SHOULDER SURGERY Left    x2   SPINE SURGERY N/A    [employer] 08/07/2019   TOE SURGERY Bilateral    bone spurs    Family History  Problem Relation Age of Onset   Heart disease Father    Hyperlipidemia Father    Alcohol abuse Brother    Alcohol abuse Paternal Uncle     Breast cancer Maternal Aunt        70's   Colon cancer Neg Hx     Social History   Socioeconomic History   Marital status: Single    Spouse name: Not on file   Number of children: 0   Years of education: Not on file   Highest education level: Bachelor's degree (e.g., BA, AB, BS)  Occupational History   Not on file  Tobacco Use   Smoking status: Former    Current packs/day: 0.00    Average packs/day: 1 pack/day for 1 year (1.0 ttl pk-yrs)    Types: Cigarettes    Start date: 04/16/1995    Quit date: 04/15/1996    Years since  quitting: 27.5    Passive exposure: Past   Smokeless tobacco: Never   Tobacco comments:    started back again in 2016, smoked for about 6 mo and then quit  Vaping Use   Vaping status: Never Used  Substance and Sexual Activity   Alcohol use: No    Alcohol/week: 0.0 standard drinks of alcohol    Comment: quit 20 years   Drug use: Yes    Frequency: 14.0 times per week    Types: Marijuana    Comment: 20 yrs. ago- cocaine    Sexual activity: Not Currently  Other Topics Concern   Not on file  Social History Narrative   Not on file   Social Drivers of Health   Financial Resource Strain: Low Risk  (07/01/2023)   Overall Financial Resource Strain (CARDIA)    Difficulty of Paying Living Expenses: Not hard at all  Food Insecurity: No Food Insecurity (07/01/2023)   Hunger Vital Sign    Worried About Running Out of Food in the Last Year: Never true    Ran Out of Food in the Last Year: Never true  Transportation Needs: No Transportation Needs (07/01/2023)   PRAPARE - Administrator, Civil Service (Medical): No    Lack of Transportation (Non-Medical): No  Physical Activity: Insufficiently Active (03/09/2023)   Exercise Vital Sign    Days of Exercise per Week: 4 days    Minutes of Exercise per Session: 30 min  Stress: No Stress Concern Present (07/01/2023)   [employer] of Occupational Health - Occupational Stress Questionnaire    Feeling of  Stress : Only a little  Social Connections: Socially Isolated (07/01/2023)   Social Connection and Isolation Panel    Frequency of Communication with Friends and Family: More than three times a week    Frequency of Social Gatherings with Friends and Family: Three times a week    Attends Religious Services: Never    Active Member of Clubs or Organizations: No    Attends Banker Meetings: Never    Marital Status: Never married  Intimate Partner Violence: Not At Risk (07/01/2023)   Humiliation, Afraid, Rape, and Kick questionnaire    Fear of Current or Ex-Partner: No    Emotionally Abused: No    Physically Abused: No    Sexually Abused: No    Review of Systems  Constitutional:  Negative for chills and fever.  Respiratory:  Positive for cough and sputum production.   All other systems reviewed and are negative.       Objective    BP (!) 154/101   Pulse 79   Ht 5' (1.524 m)   Wt 121 lb 12.8 oz (55.2 kg)   SpO2 95%   BMI 23.79 kg/m   Physical Exam Vitals and nursing note reviewed.  Constitutional:      General: She is not in acute distress. Cardiovascular:     Rate and Rhythm: Normal rate and regular rhythm.  Pulmonary:     Effort: Pulmonary effort is normal.     Breath sounds: Normal breath sounds.  Abdominal:     Palpations: Abdomen is soft.     Tenderness: There is no abdominal tenderness.  Neurological:     General: No focal deficit present.     Mental Status: She is alert and oriented to person, place, and time.         Assessment & Plan:   Acute bronchitis, unspecified organism  Essential hypertension  Upper respiratory  symptom -     Azithromycin ; Take 2 tabs today, then take 1 tab daily until gone.  Dispense: 6 tablet; Refill: 0 -     HYDROcodone  Bit-Homatrop MBr; Take 5 mLs by mouth every 6 (six) hours as needed for cough.  Dispense: 120 mL; Refill: 0  Other orders -     Losartan  Potassium; Take 1 tablet (50 mg total) by mouth daily.   Dispense: 90 tablet; Refill: 0     No follow-ups on file.   Tanda Raguel SQUIBB, MD

## 2023-10-28 ENCOUNTER — Telehealth: Payer: Self-pay

## 2023-10-28 ENCOUNTER — Ambulatory Visit: Payer: Self-pay

## 2023-10-28 ENCOUNTER — Other Ambulatory Visit: Payer: Self-pay | Admitting: Family Medicine

## 2023-10-28 MED ORDER — AMLODIPINE BESYLATE 10 MG PO TABS: 10.0000 mg | ORAL_TABLET | Freq: Every day | ORAL | 0 refills | Status: DC

## 2023-10-28 NOTE — Telephone Encounter (Signed)
 Spoke with patient,. Patient aware that amlodipine  has been sent to pharmacy.

## 2023-10-28 NOTE — Telephone Encounter (Signed)
 Spoke with patient. Patient is not eating anything before or with her medication. Advised patient to eat something when taking her medication and s/s continue to let us  know.

## 2023-10-28 NOTE — Telephone Encounter (Signed)
 Copied from CRM #[id number]. Topic: Clinical - Prescription Issue >> Oct 28, 2023  1:45 PM Geneva B wrote: Reason for CRM: patient said that she started a new bp rx losartan  (COZAAR ) 50 MG tablet08/26/25 and she has taken the rx 2 times and booth times she has thrown up from it pt has questions concerning the rx please give the patient a call back [phone] (M) >> Oct 28, 2023  2:38 PM Ivette P wrote: Pt called back in requesting to speak to provider, pt was confused, advised Nurse Luke sent the Message to the clinic and Provider or nurse should be reaching out before end of day.

## 2023-10-28 NOTE — Telephone Encounter (Signed)
 FYI Only or Action Required?: Action required by provider: clinical question for provider.  Patient was last seen in primary care on 10/27/2023 by Tanda Bleacher, MD.  Called Nurse Triage reporting Medication Reaction.  Symptoms began yesterday.  Interventions attempted: Nothing.  Symptoms are: gradually worsening.  Triage Disposition: Call PCP Now  Patient/caregiver understands and will follow disposition?: Yes, will follow disposition Copied from CRM #[id number]. Topic: Clinical - Prescription Issue >> Oct 28, 2023  1:45 PM Geneva B wrote: Reason for CRM: patient said that she started a new bp rx losartan  (COZAAR ) 50 MG tablet08/26/25 and she has taken the rx 2 times and booth times she has thrown up from it pt has questions concerning the rx please give the patient a call back [phone] (M) Reason for Disposition  [1] Caller has URGENT medicine question about med that primary care doctor (or NP/PA) or specialist prescribed AND [2] triager unable to answer question  Answer Assessment - Initial Assessment Questions 1. NAME of MEDICINE: What medicine(s) are you calling about?     losartan  2. QUESTION: What is your question? (e.g., double dose of medicine, side effect)     Every time I take the medication I vomit 3. PRESCRIBER: Who prescribed the medicine? Reason: if prescribed by specialist, call should be referred to that group.     PCP 4. SYMPTOMS: Do you have any symptoms? If Yes, ask: What symptoms are you having?  How bad are the symptoms (e.g., mild, moderate, severe)     Vomits about 40 min after taking the medication.  Denies any other s/s.  Protocols used: Medication Question Call-A-AH

## 2023-10-29 ENCOUNTER — Other Ambulatory Visit: Payer: Self-pay | Admitting: Family Medicine

## 2023-11-17 NOTE — Progress Notes (Deleted)
 BH MD Outpatient Progress Note  11/17/2023 3:34 PM [name]  MRN:  [id number]  Assessment:  [name] presents for follow-up evaluation.    ***  Patient seen for follow-up evaluation, reporting worsening depression and anxiety in the context of approaching anniversaries of her deceased parents' deaths and birthdays. She notes increased social isolation but denies any acute safety concerns.  Grief counseling was discussed again. Patient was encouraged to connect with therapy; resources were provided, and she is open to pursuing this.Patient remains hesitant to trial new medications but is amenable to titration of hydroxyzine , which was modified to 50 mg TID PRN, as she has found it helpful for sleep. Will continue current regimen of bupropion  and zonisamide  without changes.  Agree with the prior recommendation for mood stabilization; however, the patient has been historically and remains currently resistant to this intervention.  Blood pressure today was 166/105, and records indicate consistently elevated readings this past year. She is not currently taking antihypertensive medication and was advised to follow up with her PCP regarding blood pressure management.  Identifying Information: [name] is a 58 y.o. y.o. 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   Plan:  # Bipolar affective disorder, current episode depressed # R/o prolonged grief disorder -- ***Continue Wellbutrin  300 mg XL daily - ***Continue zonisamide  100 mg nightly - ***Ideally the patient would be on a better mood stabilizing medication, however, and hypomanic symptomatology is currently limited - ***Increase atarax  to 50 mg TID PRN for  anxiety and sleep  - It appears the patient restarted Soma  to help with pain  # Cannabis use disorder, moderate, dependence Interventions: -- Continue to encourage reduction use or abstinence  Patient was given contact information for behavioral health clinic and was instructed to call 911 for emergencies.   Subjective:  Chief Complaint:  No chief complaint on file.   Interval History:  During the patient's previous visit, *** was discussed, which they report ***.  AEs to medications: Medication compliance (missing doses, taking as directed):  Sleep: Appetite: Caffeine: Recent substance use: SI: Impact on functioning: ***   *** The patient reports worsening depression and anxiety since her last visit, which she attributes to the approaching anniversary of her parents' birthdays. Both parents are deceased, with her father passing away in January 2024. She describes increased social isolation, feeling that some of her friendships have become burdensome, as she typically plays a supportive role but does not feel able to do so at this time. Additional chronic stressors include her ongoing medical illnesses and recovery from a recent viral illness. She has also experienced recent worries about having a heart attack due to a history of chest pain, though a cardiology workup earlier this year was unremarkable. She notes that her limited mobility has contributed to her worsening depression, as she usually enjoys playing sports but has not been able to participate.  In terms of support, she identifies her dogs, her spirituality, and a friend as protective and mitigating factors. She reports that her sleep has improved with as-needed hydroxyzine , though she had been taking 50 mg at night instead of the originally prescribed 25 mg. She was amenable to adjusting this to 25 mg three times a day as needed to  also address anxiety.  She denies any suicidal ideation or thoughts of wanting to join her  parents in death. Therapy was discussed, and grief counseling was recommended, to which she is open. The patient confirms compliance with her psychotropic medications. The attending physician was present for the end of the assessment, and it was recommended that she follow up with her PCP for persistently elevated blood pressure noted at home and on chart review    Visit Diagnosis:  No diagnosis found.   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  The patient reports that she has not attempted suicide in several decades  Past Medical History:  Past Medical History:  Diagnosis Date   Anxiety    Arthritis    neck, knees, shoulders   Asthma    Bipolar disorder (HCC)    currently feeling MANIC- 10/02/2016   Depression    GERD (gastroesophageal reflux disease)    Hip fracture (HCC) 06/2019   History of blood transfusion    as a newborn    History of lump of left breast    Hyperlipidemia    Lumbar pseudoarthrosis    Motion sickness    cars   OSA (obstructive sleep apnea) 01/09/2016   can't afford CPAP   Personality disorder (HCC)    PONV (postoperative nausea and vomiting)    Post traumatic stress disorder (PTSD)    Substance abuse (HCC)    Synovial cyst     Past Surgical History:  Procedure Laterality Date   COLONOSCOPY WITH PROPOFOL  N/A 11/04/2017   Procedure: COLONOSCOPY WITH PROPOFOL ;  Surgeon: Unk Corinn Skiff, MD;  Location: [facility];  Service: Endoscopy;  Laterality: N/A;   ESOPHAGOGASTRODUODENOSCOPY (EGD) WITH PROPOFOL  N/A 11/04/2017   Procedure: ESOPHAGOGASTRODUODENOSCOPY (EGD) WITH PROPOFOL  with biopsies;  Surgeon: Unk Corinn Skiff, MD;  Location: [facility];  Service: Endoscopy;  Laterality: N/A;  sleep apnea   KNEE SURGERY Left    x5, post basketball injury    LUMBAR LAMINECTOMY/DECOMPRESSION MICRODISCECTOMY Left 01/22/2016   Procedure: Laminectomy for facet/synovial cyst - left - Lumbar four - lumbar five;  Surgeon: Victory Gunnels, MD;  Location: [facility] OR;  Service: Neurosurgery;  Laterality: Left;  Laminectomy for facet/synovial cyst - left - Lumbar four - lumbar five   POLYPECTOMY N/A 11/04/2017   Procedure: POLYPECTOMY INTESTINAL;  Surgeon: Unk Corinn Skiff, MD;  Location: [facility];  Service: Endoscopy;  Laterality: N/A;   SHOULDER SURGERY Left    x2   SPINE SURGERY N/A    [employer]rees[employer] 08/07/2019   TOE SURGERY Bilateral    bone spurs    Family Psychiatric History: None pertinent  Family History:  Family History  Problem Relation Age of Onset   Heart disease Father    Hyperlipidemia Father    Alcohol abuse Brother    Alcohol abuse Paternal Uncle    Breast cancer Maternal Aunt        70's   Colon cancer Neg Hx     Social History:  Social History   Socioeconomic History   Marital status: Single    Spouse name: Not on file   Number of children: 0   Years of education: Not on file   Highest education level: Bachelor's degree (e.g., BA, AB, BS)  Occupational History   Not on file  Tobacco Use   Smoking status: Former  Current packs/day: 0.00    Average packs/day: 1 pack/day for 1 year (1.0 ttl pk-yrs)    Types: Cigarettes    Start date: 04/16/1995    Quit date: 04/15/1996    Years since quitting: 27.6    Passive exposure: Past   Smokeless tobacco: Never   Tobacco comments:    started back again in 2016, smoked for about 6 mo and then quit  Vaping Use   Vaping status: Never Used  Substance and Sexual Activity   Alcohol use: No    Alcohol/week: 0.0 standard drinks of alcohol    Comment: quit 20 years   Drug use: Yes    Frequency: 14.0 times per week    Types: Marijuana    Comment: 20 yrs. ago- cocaine    Sexual activity: Not Currently  Other Topics Concern   Not on file  Social History Narrative   Not on file    Social Drivers of Health   Financial Resource Strain: Low Risk  (07/01/2023)   Overall Financial Resource Strain (CARDIA)    Difficulty of Paying Living Expenses: Not hard at all  Food Insecurity: No Food Insecurity (07/01/2023)   Hunger Vital Sign    Worried About Running Out of Food in the Last Year: Never true    Ran Out of Food in the Last Year: Never true  Transportation Needs: No Transportation Needs (07/01/2023)   PRAPARE - Administrator, Civil Service (Medical): No    Lack of Transportation (Non-Medical): No  Physical Activity: Insufficiently Active (03/09/2023)   Exercise Vital Sign    Days of Exercise per Week: 4 days    Minutes of Exercise per Session: 30 min  Stress: No Stress Concern Present (07/01/2023)   [employer] of Occupational Health - Occupational Stress Questionnaire    Feeling of Stress : Only a little  Social Connections: Socially Isolated (07/01/2023)   Social Connection and Isolation Panel    Frequency of Communication with Friends and Family: More than three times a week    Frequency of Social Gatherings with Friends and Family: Three times a week    Attends Religious Services: Never    Active Member of Clubs or Organizations: No    Attends Banker Meetings: Never    Marital Status: Never married    Allergies:  Allergies  Allergen Reactions   Other    Effexor [Venlafaxine] Other (See Comments)    UNSPECIFIED REACTION, headaches, felt funny, withdrawal with missed dose    Lamotrigine Rash and Other (See Comments)    Current Medications: Current Outpatient Medications  Medication Sig Dispense Refill   albuterol  (VENTOLIN  HFA) 108 (90 Base) MCG/ACT inhaler TAKE 2 PUFFS BY MOUTH EVERY 4 TO 6 HOURS AS NEEDED 18 each 0   amLODipine  (NORVASC ) 10 MG tablet Take 1 tablet (10 mg total) by mouth daily. 90 tablet 0   aspirin 81 MG tablet Take 81 mg by mouth daily.     azithromycin  (ZITHROMAX ) 250 MG tablet Take 2 tabs today, then  take 1 tab daily until gone. 6 tablet 0   benzonatate  (TESSALON ) 100 MG capsule Take 1 capsule (100 mg total) by mouth 2 (two) times daily as needed for cough. (Patient not taking: Reported on 10/27/2023) 20 capsule 0   buPROPion  (WELLBUTRIN  XL) 300 MG 24 hr tablet Take 1 tablet (300 mg total) by mouth daily. 30 tablet 3   Calcium  Carb-Cholecalciferol (OYSTER SHELL CALCIUM  W/D) 500-5 MG-MCG TABS TAKE 1 TABLET BY MOUTH  THREE TIMES A DAY 90 tablet 6   calcium -vitamin D  (OSCAL WITH D) 500-200 MG-UNIT tablet Take 1 tablet by mouth 3 (three) times daily. 90 tablet 6   cyclobenzaprine  (FLEXERIL ) 10 MG tablet Take 10 mg by mouth 2 (two) times daily as needed.     fluticasone  (FLONASE ) 50 MCG/ACT nasal spray PLACE 2 SPRAYS INTO BOTH NOSTRILS DAILY AS NEEDED FOR ALLERGIES OR RHINITIS. 16 mL 0   Fluticasone -Umeclidin-Vilant (TRELEGY ELLIPTA ) 200-62.5-25 MCG/ACT AEPB INHALE 1 PUFF BY MOUTH EVERY DAY 60 each 1   gabapentin  (NEURONTIN ) 300 MG capsule Take 1 capsule by mouth 3 (three) times daily.     HYDROcodone  bit-homatropine (HYCODAN) 5-1.5 MG/5ML syrup Take 5 mLs by mouth every 6 (six) hours as needed for cough. 120 mL 0   hydrOXYzine  (ATARAX ) 50 MG tablet Take 1 tablet (50 mg total) by mouth 3 (three) times daily as needed for anxiety. Take 1-2 tabs PO at bedtime prn 90 tablet 2   ipratropium (ATROVENT ) 0.03 % nasal spray 2 SPRAYS EACH NOSTRIL TWICE A DAY AS NEEDED FOR RUNNY NOSE/DRAINAGE DOWN THROAT. 30 mL 5   levocetirizine (XYZAL ) 5 MG tablet TAKE 1 TABLET EVERY DAY AS NEEDED 30 tablet 0   lidocaine  (LIDODERM ) 5 % Place 1 patch onto the skin every 12 (twelve) hours. Remove & Discard patch within 12 hours or as directed by MD 10 patch 0   losartan  (COZAAR ) 50 MG tablet Take 1 tablet (50 mg total) by mouth daily. 90 tablet 0   mepolizumab  (NUCALA ) 100 MG/ML SOSY Inject 100 mg into the skin every 28 (twenty-eight) days. 1 mL 11   montelukast  (SINGULAIR ) 10 MG tablet TAKE 1 TABLET BY MOUTH EVERYDAY AT BEDTIME  30 tablet 2   morphine (MSIR) 15 MG tablet Take 15 mg by mouth every 4 (four) hours as needed for severe pain.     pantoprazole  (PROTONIX ) 40 MG tablet Take 1 tablet (40 mg total) by mouth daily. 30 tablet 0   predniSONE  (DELTASONE ) 10 MG tablet 6,5,4,3,2,1 take each days dose all at once with food 21 tablet 0   promethazine  (PHENERGAN ) 25 MG tablet Take 1 tablet (25 mg total) by mouth every 8 (eight) hours as needed for nausea or vomiting. 20 tablet 0   promethazine -dextromethorphan (PROMETHAZINE -DM) 6.25-15 MG/5ML syrup Take 5 mLs by mouth 4 (four) times daily as needed for cough. 118 mL 0   simvastatin  (ZOCOR ) 20 MG tablet TAKE 1 TABLET BY MOUTH EVERY DAY AT 6PM 90 tablet 1   SOMA  350 MG tablet Take 1 tablet (350 mg total) by mouth 3 (three) times daily as needed for muscle spasms. 15 tablet 0   triamcinolone  (NASACORT ) 55 MCG/ACT AERO nasal inhaler      zonisamide  (ZONEGRAN ) 100 MG capsule Take 1 capsule (100 mg total) by mouth at bedtime. 30 capsule 3   Current Facility-Administered Medications  Medication Dose Route Frequency Provider Last Rate Last Admin   mepolizumab  (NUCALA ) injection 100 mg  100 mg Subcutaneous Q28 days Jeneal Danita Macintosh, MD   100 mg at 03/31/23 1523     Objective:  Psychiatric Specialty Exam: Physical Exam Constitutional:      Appearance: the patient is not toxic-appearing.  Pulmonary:     Effort: Pulmonary effort is normal.  Neurological:     General: No focal deficit present.     Mental Status: the patient is alert and oriented to person, place, and time.   Review of Systems  Respiratory:  Negative for shortness of  breath.   Cardiovascular:  Negative for chest pain.  Gastrointestinal:  Negative for abdominal pain, constipation, diarrhea, nausea and vomiting.  Neurological:  Negative for headaches.      There were no vitals taken for this visit.  General Appearance: Fairly Groomed***  Eye Contact:  Good  Speech:  Clear and Coherent  Volume:   Normal  Mood: depressed***  Affect: appropriate, tearful, depressed  Thought Process:  Coherent  Orientation:  Full (Time, Place, and Person)  Thought Content: Logical   Suicidal Thoughts:  No  Homicidal Thoughts:  No  Memory:  Immediate;   Good  Judgement:  fair ***  Insight:  fair ***  Psychomotor Activity:  Normal  Concentration:  Concentration: Good  Recall:  Good  Fund of Knowledge: Good  Language: Good  Akathisia:  No  Handed:    AIMS (if indicated): not done  Assets:  Communication Skills Desire for Improvement Financial Resources/Insurance Housing Leisure Time Physical Health  ADL's:  Intact  Cognition: WNL  Sleep: Poor     Metabolic Disorder Labs: Lab Results  Component Value Date   HGBA1C 5.6 09/17/2021   No results found for: PROLACTIN Lab Results  Component Value Date   CHOL 161 09/30/2022   TRIG 91 09/30/2022   HDL 74 09/30/2022   CHOLHDL 2.2 09/30/2022   VLDL 77.7 06/22/2018   LDLCALC 70 09/30/2022   LDLCALC 90 09/17/2021   Lab Results  Component Value Date   TSH 2.380 01/08/2023   TSH 1.480 09/17/2021    Therapeutic Level Labs: No results found for: LITHIUM No results found for: VALPROATE No results found for: CBMZ  Screenings: GAD-7    Flowsheet Row [facility]/20[facility] Office Visit from 07/21/2023 in [facility] Office Visit from 07/01/2023 in [facility] Office Visit from 01/08/2023 in [facility] at [facility] Office Visit from 07/15/2022 in [facility] at [facility]  Total GAD-7 Score 11 0 5 6 7    Mini-Mental    Flowsheet Row Office Visit from 04/17/2020 in [facility] at [facility]  Total Score (max 30 points ) 30   PHQ2-9    Flowsheet Row Office Visit from 10/14/2023 in [facility] Office Visit from 07/21/2023 in [facility]  1 Office Visit from 07/01/2023 in [facility] at [facility] Office Visit from 03/09/2023 in [facility] at [facility] Office Visit from 01/08/2023 in [facility] at [facility]  PHQ-2 Total Score 3 0 1 1 4   PHQ-9 Total Score 16 0 -- 4 9   Flowsheet Row ED from 04/21/2023 in [facility] Emergency Department at [facility] UC from 11/03/2022 in [facility] UC from 09/22/2022 in [facility] at [facility])  C-SSRS RISK CATEGORY No Risk No Risk No Risk    Collaboration of Care: none  A total of 30 minutes was spent involved in face to face clinical care, chart review, documentation.   Marlo Masson, MD 11/17/2023, 3:34 PM

## 2023-11-18 ENCOUNTER — Ambulatory Visit (HOSPITAL_COMMUNITY): Admitting: Student in an Organized Health Care Education/Training Program

## 2023-11-18 ENCOUNTER — Encounter (HOSPITAL_COMMUNITY): Payer: Self-pay

## 2023-11-21 ENCOUNTER — Other Ambulatory Visit: Payer: Self-pay | Admitting: Allergy

## 2023-11-30 ENCOUNTER — Ambulatory Visit (INDEPENDENT_AMBULATORY_CARE_PROVIDER_SITE_OTHER): Admitting: Family Medicine

## 2023-11-30 VITALS — BP 137/96 | HR 88 | Ht 60.0 in | Wt 124.4 lb

## 2023-11-30 DIAGNOSIS — Z13228 Encounter for screening for other metabolic disorders: Secondary | ICD-10-CM | POA: Diagnosis not present

## 2023-11-30 DIAGNOSIS — Z136 Encounter for screening for cardiovascular disorders: Secondary | ICD-10-CM

## 2023-11-30 DIAGNOSIS — Z1329 Encounter for screening for other suspected endocrine disorder: Secondary | ICD-10-CM | POA: Diagnosis not present

## 2023-11-30 DIAGNOSIS — Z0000 Encounter for general adult medical examination without abnormal findings: Secondary | ICD-10-CM

## 2023-11-30 DIAGNOSIS — Z130 Encounter for screening for diseases of the blood and blood-forming organs and certain disorders involving the immune mechanism: Secondary | ICD-10-CM | POA: Diagnosis not present

## 2023-11-30 NOTE — Progress Notes (Signed)
 Established Patient Office Visit  Subjective    Patient ID: [name], female    DOB: 04/22/1965  Age: 58 y.o. MRN: [id number]  CC:  Chief Complaint  Patient presents with   Annual Exam    HPI [name] presents for routine annual exam. Patient denies acute complaints.   Outpatient Encounter Medications as of 11/30/2023  Medication Sig   albuterol  (VENTOLIN  HFA) 108 (90 Base) MCG/ACT inhaler TAKE 2 PUFFS BY MOUTH EVERY 4 TO 6 HOURS AS NEEDED   amLODipine  (NORVASC ) 10 MG tablet Take 1 tablet (10 mg total) by mouth daily.   aspirin 81 MG tablet Take 81 mg by mouth daily.   azithromycin  (ZITHROMAX ) 250 MG tablet Take 2 tabs today, then take 1 tab daily until gone.   buPROPion  (WELLBUTRIN  XL) 300 MG 24 hr tablet Take 1 tablet (300 mg total) by mouth daily.   Calcium  Carb-Cholecalciferol (OYSTER SHELL CALCIUM  W/D) 500-5 MG-MCG TABS TAKE 1 TABLET BY MOUTH THREE TIMES A DAY   calcium -vitamin D  (OSCAL WITH D) 500-200 MG-UNIT tablet Take 1 tablet by mouth 3 (three) times daily.   cyclobenzaprine  (FLEXERIL ) 10 MG tablet Take 10 mg by mouth 2 (two) times daily as needed.   fluticasone  (FLONASE ) 50 MCG/ACT nasal spray PLACE 2 SPRAYS INTO BOTH NOSTRILS DAILY AS NEEDED FOR ALLERGIES OR RHINITIS.   gabapentin  (NEURONTIN ) 300 MG capsule Take 1 capsule by mouth 3 (three) times daily.   HYDROcodone  bit-homatropine (HYCODAN) 5-1.5 MG/5ML syrup Take 5 mLs by mouth every 6 (six) hours as needed for cough.   hydrOXYzine  (ATARAX ) 50 MG tablet Take 1 tablet (50 mg total) by mouth 3 (three) times daily as needed for anxiety. Take 1-2 tabs PO at bedtime prn   ipratropium (ATROVENT ) 0.03 % nasal spray 2 SPRAYS EACH NOSTRIL TWICE A DAY AS NEEDED FOR RUNNY NOSE/DRAINAGE DOWN THROAT.   levocetirizine (XYZAL ) 5 MG tablet TAKE 1 TABLET EVERY DAY AS NEEDED   lidocaine  (LIDODERM ) 5 % Place 1 patch onto the skin every 12 (twelve) hours. Remove & Discard patch within 12 hours or as directed by MD   losartan   (COZAAR ) 50 MG tablet Take 1 tablet (50 mg total) by mouth daily.   mepolizumab  (NUCALA ) 100 MG/ML SOSY Inject 100 mg into the skin every 28 (twenty-eight) days.   montelukast  (SINGULAIR ) 10 MG tablet TAKE 1 TABLET BY MOUTH EVERYDAY AT BEDTIME   morphine (MSIR) 15 MG tablet Take 15 mg by mouth every 4 (four) hours as needed for severe pain.   pantoprazole  (PROTONIX ) 40 MG tablet TAKE 1 TABLET BY MOUTH EVERY DAY   predniSONE  (DELTASONE ) 10 MG tablet 6,5,4,3,2,1 take each days dose all at once with food   promethazine  (PHENERGAN ) 25 MG tablet Take 1 tablet (25 mg total) by mouth every 8 (eight) hours as needed for nausea or vomiting.   promethazine -dextromethorphan (PROMETHAZINE -DM) 6.25-15 MG/5ML syrup Take 5 mLs by mouth 4 (four) times daily as needed for cough.   simvastatin  (ZOCOR ) 20 MG tablet TAKE 1 TABLET BY MOUTH EVERY DAY AT 6PM   SOMA  350 MG tablet Take 1 tablet (350 mg total) by mouth 3 (three) times daily as needed for muscle spasms.   TRELEGY ELLIPTA  200-62.5-25 MCG/ACT AEPB INHALE 1 PUFF BY MOUTH EVERY DAY   triamcinolone  (NASACORT ) 55 MCG/ACT AERO nasal inhaler    zonisamide  (ZONEGRAN ) 100 MG capsule Take 1 capsule (100 mg total) by mouth at bedtime.   benzonatate  (TESSALON ) 100 MG capsule Take 1 capsule (100  mg total) by mouth 2 (two) times daily as needed for cough. (Patient not taking: Reported on 11/30/2023)   Facility-Administered Encounter Medications as of 11/30/2023  Medication   mepolizumab  (NUCALA ) injection 100 mg    Past Medical History:  Diagnosis Date   Anxiety    Arthritis    neck, knees, shoulders   Asthma    Bipolar disorder (HCC)    currently feeling MANIC- 10/02/2016   Depression    GERD (gastroesophageal reflux disease)    Hip fracture (HCC) 06/2019   History of blood transfusion    as a newborn    History of lump of left breast    Hyperlipidemia    Lumbar pseudoarthrosis    Motion sickness    cars   OSA (obstructive sleep apnea) 01/09/2016   can't  afford CPAP   Personality disorder (HCC)    PONV (postoperative nausea and vomiting)    Post traumatic stress disorder (PTSD)    Substance abuse (HCC)    Synovial cyst     Past Surgical History:  Procedure Laterality Date   COLONOSCOPY WITH PROPOFOL  N/A 11/04/2017   Procedure: COLONOSCOPY WITH PROPOFOL ;  Surgeon: Unk Corinn Skiff, MD;  Location: [facility];  Service: Endoscopy;  Laterality: N/A;   ESOPHAGOGASTRODUODENOSCOPY (EGD) WITH PROPOFOL  N/A 11/04/2017   Procedure: ESOPHAGOGASTRODUODENOSCOPY (EGD) WITH PROPOFOL  with biopsies;  Surgeon: Unk Corinn Skiff, MD;  Location: [facility];  Service: Endoscopy;  Laterality: N/A;  sleep apnea   KNEE SURGERY Left    x5, post basketball injury   LUMBAR LAMINECTOMY/DECOMPRESSION MICRODISCECTOMY Left 01/22/2016   Procedure: Laminectomy for facet/synovial cyst - left - Lumbar four - lumbar five;  Surgeon: Victory Gunnels, MD;  Location: [facility] OR;  Service: Neurosurgery;  Laterality: Left;  Laminectomy for facet/synovial cyst - left - Lumbar four - lumbar five   POLYPECTOMY N/A 11/04/2017   Procedure: POLYPECTOMY INTESTINAL;  Surgeon: Unk Corinn Skiff, MD;  Location: [facility];  Service: Endoscopy;  Laterality: N/A;   SHOULDER SURGERY Left    x2   SPINE SURGERY N/A    [employer] 08/07/2019   TOE SURGERY Bilateral    bone spurs    Family History  Problem Relation Age of Onset   Heart disease Father    Hyperlipidemia Father    Alcohol abuse Brother    Alcohol abuse Paternal Uncle    Breast cancer Maternal Aunt        70's   Colon cancer Neg Hx     Social History   Socioeconomic History   Marital status: Single    Spouse name: Not on file   Number of children: 0   Years of education: Not on file   Highest education level: Bachelor's degree (e.g., BA, AB, BS)  Occupational History   Not on file  Tobacco Use   Smoking status: Former    Current packs/day: 0.00    Average packs/day: 1 pack/day for 1 year (1.0 ttl  pk-yrs)    Types: Cigarettes    Start date: 04/16/1995    Quit date: 04/15/1996    Years since quitting: 27.6    Passive exposure: Past   Smokeless tobacco: Never   Tobacco comments:    started back again in 2016, smoked for about 6 mo and then quit  Vaping Use   Vaping status: Never Used  Substance and Sexual Activity   Alcohol use: No    Alcohol/week: 0.0 standard drinks of alcohol    Comment: quit 20 years  Drug use: Yes    Frequency: 14.0 times per week    Types: Marijuana    Comment: 20 yrs. ago- cocaine    Sexual activity: Not Currently  Other Topics Concern   Not on file  Social History Narrative   Not on file   Social Drivers of Health   Financial Resource Strain: Medium Risk (11/30/2023)   Overall Financial Resource Strain (CARDIA)    Difficulty of Paying Living Expenses: Somewhat hard  Food Insecurity: Food Insecurity Present (11/30/2023)   Hunger Vital Sign    Worried About Running Out of Food in the Last Year: Sometimes true    Ran Out of Food in the Last Year: Sometimes true  Transportation Needs: No Transportation Needs (11/30/2023)   PRAPARE - Administrator, Civil Service (Medical): No    Lack of Transportation (Non-Medical): No  Physical Activity: Insufficiently Active (11/30/2023)   Exercise Vital Sign    Days of Exercise per Week: 3 days    Minutes of Exercise per Session: 20 min  Stress: Stress Concern Present (11/30/2023)   [employer] of Occupational Health - Occupational Stress Questionnaire    Feeling of Stress: Rather much  Social Connections: Socially Isolated (11/30/2023)   Social Connection and Isolation Panel    Frequency of Communication with Friends and Family: More than three times a week    Frequency of Social Gatherings with Friends and Family: More than three times a week    Attends Religious Services: Never    Database administrator or Organizations: No    Attends Engineer, structural: Not on file    Marital  Status: Never married  Intimate Partner Violence: Not At Risk (07/01/2023)   Humiliation, Afraid, Rape, and Kick questionnaire    Fear of Current or Ex-Partner: No    Emotionally Abused: No    Physically Abused: No    Sexually Abused: No    Review of Systems  All other systems reviewed and are negative.       Objective    BP (!) 137/96   Pulse 88   Ht 5' (1.524 m)   Wt 124 lb 6.4 oz (56.4 kg)   SpO2 98%   BMI 24.30 kg/m   Physical Exam Vitals and nursing note reviewed.  Constitutional:      General: She is not in acute distress. HENT:     Head: Normocephalic and atraumatic.     Right Ear: Tympanic membrane, ear canal and external ear normal.     Left Ear: Tympanic membrane, ear canal and external ear normal.     Nose: Nose normal.     Mouth/Throat:     Mouth: Mucous membranes are moist.     Pharynx: Oropharynx is clear.  Eyes:     Conjunctiva/sclera: Conjunctivae normal.     Pupils: Pupils are equal, round, and reactive to light.  Neck:     Thyroid : No thyromegaly.  Cardiovascular:     Rate and Rhythm: Normal rate and regular rhythm.     Heart sounds: Normal heart sounds. No murmur heard. Pulmonary:     Effort: Pulmonary effort is normal. No respiratory distress.     Breath sounds: Normal breath sounds.  Abdominal:     General: There is no distension.     Palpations: Abdomen is soft. There is no mass.     Tenderness: There is no abdominal tenderness.  Musculoskeletal:        General: Normal range of motion.  Cervical back: Normal range of motion and neck supple.  Skin:    General: Skin is warm and dry.  Neurological:     General: No focal deficit present.     Mental Status: She is alert and oriented to person, place, and time.  Psychiatric:        Mood and Affect: Mood normal.        Behavior: Behavior normal.         Assessment & Plan:   Annual physical exam -     CMP14+EGFR  Screening for deficiency anemia -     CBC with  Differential/Platelet  Encounter for screening for cardiovascular disorders -     Lipid panel  Screening for endocrine/metabolic/immunity disorders -     VITAMIN D  25 Hydroxy (Vit-D Deficiency, Fractures) -     Hemoglobin A1c -     TSH     No follow-ups on file.   Tanda Raguel SQUIBB, MD

## 2023-12-01 ENCOUNTER — Other Ambulatory Visit: Payer: Self-pay

## 2023-12-01 ENCOUNTER — Emergency Department
Admission: EM | Admit: 2023-12-01 | Discharge: 2023-12-01 | Disposition: A | Discharge status: Home / Self Care | Attending: Emergency Medicine | Admitting: Emergency Medicine

## 2023-12-01 ENCOUNTER — Emergency Department

## 2023-12-01 DIAGNOSIS — W540XXA Bitten by dog, initial encounter: Secondary | ICD-10-CM | POA: Diagnosis not present

## 2023-12-01 DIAGNOSIS — Z23 Encounter for immunization: Secondary | ICD-10-CM | POA: Insufficient documentation

## 2023-12-01 DIAGNOSIS — M25562 Pain in left knee: Secondary | ICD-10-CM | POA: Diagnosis not present

## 2023-12-01 DIAGNOSIS — I10 Essential (primary) hypertension: Secondary | ICD-10-CM | POA: Insufficient documentation

## 2023-12-01 DIAGNOSIS — M7989 Other specified soft tissue disorders: Secondary | ICD-10-CM | POA: Diagnosis not present

## 2023-12-01 DIAGNOSIS — M545 Low back pain, unspecified: Secondary | ICD-10-CM | POA: Diagnosis not present

## 2023-12-01 DIAGNOSIS — S51851A Open bite of right forearm, initial encounter: Secondary | ICD-10-CM | POA: Diagnosis not present

## 2023-12-01 DIAGNOSIS — G8929 Other chronic pain: Secondary | ICD-10-CM | POA: Diagnosis not present

## 2023-12-01 DIAGNOSIS — M549 Dorsalgia, unspecified: Secondary | ICD-10-CM | POA: Diagnosis not present

## 2023-12-01 DIAGNOSIS — S41151A Open bite of right upper arm, initial encounter: Secondary | ICD-10-CM | POA: Diagnosis not present

## 2023-12-01 DIAGNOSIS — S51831A Puncture wound without foreign body of right forearm, initial encounter: Secondary | ICD-10-CM | POA: Diagnosis not present

## 2023-12-01 DIAGNOSIS — Z981 Arthrodesis status: Secondary | ICD-10-CM | POA: Diagnosis not present

## 2023-12-01 DIAGNOSIS — S4991XA Unspecified injury of right shoulder and upper arm, initial encounter: Secondary | ICD-10-CM | POA: Diagnosis present

## 2023-12-01 DIAGNOSIS — S81851A Open bite, right lower leg, initial encounter: Secondary | ICD-10-CM | POA: Diagnosis not present

## 2023-12-01 LAB — CMP14+EGFR
ALT: 19 IU/L (ref 0–32)
AST: 16 IU/L (ref 0–40)
Albumin: 4.8 g/dL (ref 3.8–4.9)
Alkaline Phosphatase: 101 IU/L (ref 49–135)
BUN/Creatinine Ratio: 13 (ref 9–23)
BUN: 12 mg/dL (ref 6–24)
Bilirubin Total: 0.3 mg/dL (ref 0.0–1.2)
CO2: 22 mmol/L (ref 20–29)
Calcium: 9.9 mg/dL (ref 8.7–10.2)
Chloride: 100 mmol/L (ref 96–106)
Creatinine, Ser: 0.93 mg/dL (ref 0.57–1.00)
Globulin, Total: 2.5 g/dL (ref 1.5–4.5)
Glucose: 82 mg/dL (ref 70–99)
Potassium: 5 mmol/L (ref 3.5–5.2)
Sodium: 137 mmol/L (ref 134–144)
Total Protein: 7.3 g/dL (ref 6.0–8.5)
eGFR: 71 mL/min/1.73 (ref >59)

## 2023-12-01 LAB — CBC WITH DIFFERENTIAL/PLATELET
Basophils Absolute: 0 x10E3/uL (ref 0.0–0.2)
Basos: 1 %
EOS (ABSOLUTE): 0.1 x10E3/uL (ref 0.0–0.4)
Eos: 1 %
Hematocrit: 43.9 % (ref 34.0–46.6)
Hemoglobin: 14.7 g/dL (ref 11.1–15.9)
Immature Grans (Abs): 0 x10E3/uL (ref 0.0–0.1)
Immature Granulocytes: 0 %
Lymphocytes Absolute: 2.6 x10E3/uL (ref 0.7–3.1)
Lymphs: 36 %
MCH: 31.6 pg (ref 26.6–33.0)
MCHC: 33.5 g/dL (ref 31.5–35.7)
MCV: 94 fL (ref 79–97)
Monocytes Absolute: 0.5 x10E3/uL (ref 0.1–0.9)
Monocytes: 7 %
Neutrophils Absolute: 3.9 x10E3/uL (ref 1.4–7.0)
Neutrophils: 55 %
Platelets: 389 x10E3/uL (ref 150–450)
RBC: 4.65 x10E6/uL (ref 3.77–5.28)
RDW: 13 % (ref 11.7–15.4)
WBC: 7.1 x10E3/uL (ref 3.4–10.8)

## 2023-12-01 LAB — LIPID PANEL
Chol/HDL Ratio: 2.2 ratio (ref 0.0–4.4)
Cholesterol, Total: 217 mg/dL — ABNORMAL HIGH (ref 100–199)
HDL: 97 mg/dL (ref >39)
LDL Chol Calc (NIH): 108 mg/dL — ABNORMAL HIGH (ref 0–99)
Triglycerides: 68 mg/dL (ref 0–149)
VLDL Cholesterol Cal: 12 mg/dL (ref 5–40)

## 2023-12-01 LAB — HEMOGLOBIN A1C
Est. average glucose Bld gHb Est-mCnc: 108 mg/dL
Hgb A1c MFr Bld: 5.4 % (ref 4.8–5.6)

## 2023-12-01 LAB — VITAMIN D 25 HYDROXY (VIT D DEFICIENCY, FRACTURES): Vit D, 25-Hydroxy: 27.5 ng/mL — ABNORMAL LOW (ref 30.0–100.0)

## 2023-12-01 LAB — TSH: TSH: 1.11 u[IU]/mL (ref 0.450–4.500)

## 2023-12-01 MED ORDER — AMOXICILLIN-POT CLAVULANATE 875-125 MG PO TABS
1.0000 | ORAL_TABLET | Freq: Once | ORAL | Status: AC
  Administered 2023-12-01: 1 via ORAL
  Filled 2023-12-01: qty 1

## 2023-12-01 MED ORDER — AMOXICILLIN-POT CLAVULANATE 875-125 MG PO TABS: 1.0000 | ORAL_TABLET | Freq: Two times a day (BID) | ORAL | 0 refills | Status: AC

## 2023-12-01 MED ORDER — TETANUS-DIPHTH-ACELL PERTUSSIS 5-2.5-18.5 LF-MCG/0.5 IM SUSY
0.5000 mL | PREFILLED_SYRINGE | Freq: Once | INTRAMUSCULAR | Status: AC
  Administered 2023-12-01: 0.5 mL via INTRAMUSCULAR
  Filled 2023-12-01: qty 0.5

## 2023-12-01 NOTE — Discharge Instructions (Addendum)
 Start antibiotics to prevent any infection.  Return to the ER for fevers, worsening redness or any other concerns.  We discussed rabies treatment but you have declined.  You should monitor your dogs for 10 days to see if they develop any change in symptoms.

## 2023-12-01 NOTE — ED Provider Notes (Signed)
 [facility] Provider Note    Event Date/Time   First MD Initiated Contact with Patient 12/01/23 [phone]     (approximate)   History   Animal Bite and Back Pain   HPI  [name] is a 58 y.o. female with history of hypertension who comes in with concerns for dog bite. Vs dog scratch.  Patient reports that her 2 dogs were on her bed and they were starting to fight and that she tried to separate them and that she developed a wound to her right forearm.  She is unsure if it was a scratch versus dog bite.  She reports that during the altercation she feels that she wrestled an alligator and she reported some worsening pain in her low back, left knee however she denies falling to the ground hitting her head, being on any blood thinners and she reports that she just feels kind of sore all over.  Dogs are not up-to-date on rabies vaccines but have not been vaccinated previously Physical Exam   Triage Vital Signs: ED Triage Vitals  Encounter Vitals Group     BP 12/01/23 0150 (!) 161/109     Girls Systolic BP Percentile --      Girls Diastolic BP Percentile --      Boys Systolic BP Percentile --      Boys Diastolic BP Percentile --      Pulse Rate 12/01/23 0150 78     Resp 12/01/23 0150 18     Temp 12/01/23 0150 98.5 F (36.9 C)     Temp Source 12/01/23 0515 Oral     SpO2 12/01/23 0150 96 %     Weight 12/01/23 0149 123 lb (55.8 kg)     Height 12/01/23 0149 5' (1.524 m)     Head Circumference --      Peak Flow --      Pain Score 12/01/23 0149 7     Pain Loc --      Pain Education --      Exclude from Growth Chart --     Most recent vital signs: Vitals:   12/01/23 0150 12/01/23 0515  BP: (!) 161/109 (!) 160/90  Pulse: 78 66  Resp: 18 18  Temp: 98.5 F (36.9 C) 98 F (36.7 C)  SpO2: 96% 100%     General: Awake, no distress.  CV:  Good peripheral perfusion.  Resp:  Normal effort.  Abd:  No distention.  Other:  Soft and nontender Puncture  wounds noted to the right arm with good distal pulse.  ED Results / Procedures / Treatments   Labs (all labs ordered are listed, but only abnormal results are displayed) Labs Reviewed - No data to display   EKG  My interpretation of EKG:    RADIOLOGY I have reviewed the xray personally and interpreted no evidence of any fracture   PROCEDURES:  Critical Care performed: No  Procedures   MEDICATIONS ORDERED IN ED: Medications  Tdap (BOOSTRIX) injection 0.5 mL (has no administration in time range)  amoxicillin -clavulanate (AUGMENTIN ) 875-125 MG per tablet 1 tablet (has no administration in time range)     IMPRESSION / MDM / ASSESSMENT AND PLAN / ED COURSE  I reviewed the triage vital signs and the nursing notes.   Patient's presentation is most consistent with acute, uncomplicated illness.   Patient comes in with possible dog bite versus dog scratch.  X-rays ordered from triage given she reports some pain in her back,  knee to ensure no fracture.  She has no significant tenderness on examination other than that her right forearm her x-rays are reassuring without evidence of any fractures.  Will update Tdap given she is just at 5 years, washout wounds.  We discussed rabies vaccination given dogs are not up-to-date on rabies vaccines but patient declined.  She understands that rabies can lead to death but she reports that her dogs are indoor dogs only that she is with them and she denies any concerns for them having contracted rabies.  She does not want to undergo rabies prophylactic treatment.  She understands that dogs will need to be quarantined for 10 days.  I did alert charge nurse who will notify animal control.  Will start patient on Augmentin    FINAL CLINICAL IMPRESSION(S) / ED DIAGNOSES   Final diagnoses:  Dog bite, initial encounter     Rx / DC Orders   ED Discharge Orders          Ordered    amoxicillin -clavulanate (AUGMENTIN ) 875-125 MG tablet  2 times  daily        12/01/23 0554             Note:  This document was prepared using Dragon voice recognition software and may include unintentional dictation errors.   Ernest Ronal BRAVO, MD 12/01/23 [phone]

## 2023-12-01 NOTE — ED Triage Notes (Signed)
 Pt reports he dogs began fighting tonight and she was trying to separate them, pt reports while she was trying to break them up she hurt her lower back and left knee. Pt has chronic back pain. Pt has some puncture wounds to right forearm from dogs.

## 2023-12-03 ENCOUNTER — Ambulatory Visit: Payer: Self-pay | Admitting: Family Medicine

## 2023-12-03 MED ORDER — VITAMIN D (ERGOCALCIFEROL) 1.25 MG (50000 UNIT) PO CAPS: 50000.0000 [IU] | ORAL_CAPSULE | ORAL | 0 refills | Status: AC

## 2023-12-06 ENCOUNTER — Other Ambulatory Visit: Payer: Self-pay | Admitting: Allergy

## 2023-12-07 ENCOUNTER — Other Ambulatory Visit: Payer: Self-pay | Admitting: *Deleted

## 2023-12-11 ENCOUNTER — Telehealth: Payer: Self-pay

## 2023-12-11 MED ORDER — AZELASTINE HCL 0.1 % NA SOLN: 2.0000 | Freq: Two times a day (BID) | NASAL | 5 refills | Status: AC | PRN

## 2023-12-11 NOTE — Telephone Encounter (Signed)
 Patient requesting refill on Azelastine  0.1% (137 MCG) spray Last refilled 06/05/2022 as courtesy refill  How many refills 3 Last office visit 09/23/23 / Next office visit 01/21/24 with Dr. Jeneal   Looks like this medication was discontinued on 10/23/2022 in medication history dated 02/27/2022, please advise and Thanks.  Dr. Jeneal is out of office sent to on call provider.

## 2023-12-11 NOTE — Addendum Note (Signed)
 Addended by: Sheneika Walstad E on: 12/11/2023 04:41 PM   Modules accepted: Orders

## 2023-12-11 NOTE — Telephone Encounter (Signed)
 Okay to refill prescription from 06/05/22.SABRA

## 2023-12-15 ENCOUNTER — Ambulatory Visit: Payer: Self-pay | Admitting: *Deleted

## 2023-12-15 NOTE — Telephone Encounter (Signed)
 FYI Only or Action Required?: Action required by provider: request for appointment, update on patient condition, and to call back if appt available .  Patient was last seen in primary care on 11/30/2023 by Tanda Bleacher, MD.  Called Nurse Triage reporting Back Pain.  Symptoms began several days ago.  Interventions attempted: Prescription medications: morphine .  Symptoms are: gradually worsening.  Triage Disposition: See Physician Within 24 Hours 4-24 hours.   Patient/caregiver understands and will follow disposition?: No, wishes to speak with PCP   See NT encounter regarding numbness in right arm / hand. Please advise. Patient declined to be evaluated today.              Copied from CRM [id number]. Topic: Clinical - Red Word Triage >> Dec 15, 2023  3:27 PM Olam RAMAN wrote: Red Word that prompted transfer to Nurse Triage: pt is having sever back pain, not able to walk or stand too long. Pt did ask about mobile clinic as well. Reason for Disposition  [1] Numbness in an arm or hand (i.e., loss of sensation) AND [2] upper back pain    Pain in lower back not upper back  Answer Assessment - Initial Assessment Questions Recommended UC or mobile bus today . Patient reports she has already taken morphine and can not drive and no one else to take her today. Recommended evaluation today, due to numbness in right hand and arm. Patient reports sx constant and is not having any other sx related to stroke like blurred vision no speech issues no unsteady balance. Reports she is dizzy at times and having issues walking because of pain . Recommended ED if sx worsen or to call 911. Patient reports she is not having all that.  Gave patient location of mobile bus for tomorrow. Again recommended evaluation today and patient declined  CAL called no answer unavailable to leave message.     1. ONSET: When did the pain begin? (e.g., minutes, hours, days)     Last Friday  2. LOCATION: Where  does it hurt? (upper, mid or lower back)     Low back  3. SEVERITY: How bad is the pain?  (e.g., Scale 1-10; mild, moderate, or severe)     Dull pain standing 6/10 4. PATTERN: Is the pain constant? (e.g., yes, no; constant, intermittent)      Constant  5. RADIATION: Does the pain shoot into your legs or somewhere else?     Bilateral hip pain 6. CAUSE:  What do you think is causing the back pain?      Picked up heavy load of wet laundry  7. BACK OVERUSE:  Any recent lifting of heavy objects, strenuous work or exercise?     Picked up wet laundry  8. MEDICINES: What have you taken so far for the pain? (e.g., nothing, acetaminophen , NSAIDS)     Morphine  9. NEUROLOGIC SYMPTOMS: Do you have any weakness, numbness, or problems with bowel/bladder control?     No  10. OTHER SYMPTOMS: Do you have any other symptoms? (e.g., fever, abdomen pain, burning with urination, blood in urine)       Pain across hips , low back low abdomen . Right arm and hand numbness since Friday , headache comes and goes. Denies blurred vision, no slurred speech mild dizziness at times. 11. PREGNANCY: Is there any chance you are pregnant? When was your last menstrual period?       na  Protocols used: Back Pain-A-AH

## 2023-12-20 NOTE — Progress Notes (Unsigned)
 BH MD Outpatient Progress Note  12/22/2023 2:44 PM [name]  MRN:  [id number]  Virtual Visit via Telephone Note  I connected with [name] on 12/22/23 at  4:00 PM EDT by a video enabled telemedicine application and verified that I am speaking with the correct person using two identifiers.  Location: Patient: Home Provider: Office   I discussed the limitations, risks, security and privacy concerns of performing an evaluation and management service by telephone and the availability of in person appointments. I also discussed with the patient that there may be a patient responsible charge related to this service. The patient expressed understanding and agreed to proceed.   I discussed the assessment and treatment plan with the patient. The patient was provided an opportunity to ask questions and all were answered. The patient agreed with the plan and demonstrated an understanding of the instructions.   The patient was advised to call back or seek an in-person evaluation if the symptoms worsen or if the condition fails to improve as anticipated.   Marlo Masson, MD Psych Resident, PGY-3     Assessment:  [name] presents for follow-up evaluation.    Patient currently tolerating and benefiting from current psychotropic regimen. On a positive note she has been increasing participating in activities she enjoys. And has recently started seeing a therapist.  No significant mood symptoms reported at this visit. Appropriate to current medications at their current dose. No acute safety concerns identified in this visit.  Identifying Information: Lashunta Frieden is a 58 y.o. y.o. 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   Plan:  # Bipolar affective disorder, current episode depressed # R/o prolonged grief disorder -- Continue Wellbutrin  300 mg XL daily - Continue zonisamide  100 mg nightly - Continue atarax  50 mg TID PRN for anxiety and sleep   # Cannabis use disorder, moderate, dependence Interventions: -- Continue to encourage reduction use/abstinence  Patient was given contact information for behavioral health clinic and was instructed to call 911 for emergencies.    Health Maintenance: Chronic pain - per PDMP (reviewed 12/22/2023) currently prescribed Carisoprodol  and Morphine monthly by another provider  Subjective:  Chief Complaint:  No chief complaint on file.   Interval History:  Patient reports starting therapy this week with an NP provider, [name], and describes the initial session as going well. She reports ongoing symptoms of depression, which she characterizes as ups and downs, but notes that she has been making efforts to socialize. She finds Wellbutrin  helpful for her mood. She reports using hydroxyzine  three times a day for anxiety and finds it beneficial. She denies adverse effects to medications and states she is taking her medications as directed. She reports that her sleep has been improving and her appetite remains unchanged. She denies suicidal ideation, homicidal ideation, and auditory/visual hallucinations.   Visit Diagnosis:    ICD-10-CM   1. Psychophysiological insomnia  F51.04 hydrOXYzine  (ATARAX ) 50 MG tablet    2. 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  The patient reports that she has not attempted suicide in several  decades  Past Medical History:  Past Medical History:  Diagnosis Date   Anxiety    Arthritis    neck, knees, shoulders   Asthma    Bipolar disorder (HCC)    currently feeling MANIC- 10/02/2016   Depression    GERD (gastroesophageal reflux disease)    Hip fracture (HCC) 06/2019   History of blood transfusion    as a newborn    History of lump of left breast    Hyperlipidemia    Lumbar pseudoarthrosis    Motion sickness    cars   OSA (obstructive sleep apnea) 01/09/2016   can't afford CPAP   Personality disorder (HCC)    PONV (postoperative nausea and vomiting)    Post traumatic stress disorder (PTSD)    Substance abuse (HCC)    Synovial cyst     Past Surgical History:  Procedure Laterality Date   COLONOSCOPY WITH PROPOFOL  N/A 11/04/2017   Procedure: COLONOSCOPY WITH PROPOFOL ;  Surgeon: Unk Corinn Skiff, MD;  Location[facility];  Service: Endoscopy;  Laterality: N/A;   ESOPHAGOGASTRODUODENOSCOPY (EGD) WITH PROPOFOL  N/A 11/04/2017   Procedure: ESOPHAGOGASTRODUODENOSCOPY (EGD) WITH PROPOFOL  with biopsies;  Surgeon: Unk Corinn Skiff, MD;  Location: [facility];  Service: Endoscopy;  Laterality: N/A;  sleep apnea   KNEE SURGERY Left    x5, post basketball injury   LUMBAR LAMINECTOMY/DECOMPRESSION MICRODISCECTOMY Left 01/22/2016   Procedure: Laminectomy for facet/synovial cyst - left - Lumbar four - lumbar five;  Surgeon: Victory Gunnels, MD;  Location: [facility] OR;  Service: Neurosurgery;  Laterality: Left;  Laminectomy for facet/synovial cyst - left - Lumbar four - lumbar five   POLYPECTOMY N/A 11/04/2017   Procedure: POLYPECTOMY INTESTINAL;  Surgeon: Unk Corinn Skiff, MD;  Location: [facility];  Service: Endoscopy;  Laterality: N/A;   SHOULDER SURGERY Left    x2   SPINE SURGERY N/A    Phreesia 08/07/2019   TOE SURGERY Bilateral    bone spurs    Family Psychiatric History: None pertinent  Family History:  Family History  Problem Relation Age of Onset    Heart disease Father    Hyperlipidemia Father    Alcohol abuse Brother    Alcohol abuse Paternal Uncle    Breast cancer Maternal Aunt        70's   Colon cancer Neg Hx     Social History:  Social History   Socioeconomic History   Marital status: Single    Spouse name: Not on file   Number of children: 0   Years of education: Not on file   Highest education level: Bachelor's degree (e.g., BA, AB, BS)  Occupational History   Not on file  Tobacco Use   Smoking status: Former    Current packs/day: 0.00    Average packs/day: 1 pack/day for 1 year (1.0 ttl pk-yrs)    Types: Cigarettes    Start date: 04/16/1995    Quit date: 04/15/1996    Years since quitting: 27.7    Passive exposure: Past   Smokeless tobacco: Never   Tobacco comments:    started back again in 2016, smoked for about 6 mo and then quit  Vaping Use   Vaping status: Never Used  Substance and Sexual Activity   Alcohol use: No    Alcohol/week: 0.0 standard drinks of alcohol  Comment: quit 20 years   Drug use: Yes    Frequency: 14.0 times per week    Types: Marijuana    Comment: 20 yrs. ago- cocaine    Sexual activity: Not Currently  Other Topics Concern   Not on file  Social History Narrative   Not on file   Social Drivers of Health   Financial Resource Strain: Medium Risk (11/30/2023)   Overall Financial Resource Strain (CARDIA)    Difficulty of Paying Living Expenses: Somewhat hard  Food Insecurity: Food Insecurity Present (11/30/2023)   Hunger Vital Sign    Worried About Running Out of Food in the Last Year: Sometimes true    Ran Out of Food in the Last Year: Sometimes true  Transportation Needs: No Transportation Needs (11/30/2023)   PRAPARE - Administrator, Civil Service (Medical): No    Lack of Transportation (Non-Medical): No  Physical Activity: Insufficiently Active (11/30/2023)   Exercise Vital Sign    Days of Exercise per Week: 3 days    Minutes of Exercise per Session: 20 min   Stress: Stress Concern Present (11/30/2023)   [employer] of Occupational Health - Occupational Stress Questionnaire    Feeling of Stress: Rather much  Social Connections: Socially Isolated (11/30/2023)   Social Connection and Isolation Panel    Frequency of Communication with Friends and Family: More than three times a week    Frequency of Social Gatherings with Friends and Family: More than three times a week    Attends Religious Services: Never    Database administrator or Organizations: No    Attends Engineer, structural: Not on file    Marital Status: Never married    Allergies:  Allergies  Allergen Reactions   Other    Effexor [Venlafaxine] Other (See Comments)    UNSPECIFIED REACTION, headaches, felt funny, withdrawal with missed dose    Lamotrigine Rash and Other (See Comments)    Current Medications: Current Outpatient Medications  Medication Sig Dispense Refill   albuterol  (VENTOLIN  HFA) 108 (90 Base) MCG/ACT inhaler TAKE 2 PUFFS BY MOUTH EVERY 4 TO 6 HOURS AS NEEDED 18 each 0   amLODipine  (NORVASC ) 10 MG tablet Take 1 tablet (10 mg total) by mouth daily. 90 tablet 0   aspirin 81 MG tablet Take 81 mg by mouth daily.     azelastine  (ASTELIN ) 0.1 % nasal spray Place 2 sprays into both nostrils 2 (two) times daily as needed for rhinitis. Use in each nostril as directed 30 mL 5   azithromycin  (ZITHROMAX ) 250 MG tablet Take 2 tabs today, then take 1 tab daily until gone. 6 tablet 0   benzonatate  (TESSALON ) 100 MG capsule Take 1 capsule (100 mg total) by mouth 2 (two) times daily as needed for cough. (Patient not taking: Reported on 11/30/2023) 20 capsule 0   buPROPion  (WELLBUTRIN  XL) 300 MG 24 hr tablet Take 1 tablet (300 mg total) by mouth daily. 30 tablet 2   Calcium  Carb-Cholecalciferol (OYSTER SHELL CALCIUM  W/D) 500-5 MG-MCG TABS TAKE 1 TABLET BY MOUTH THREE TIMES A DAY 90 tablet 6   calcium -vitamin D  (OSCAL WITH D) 500-200 MG-UNIT tablet Take 1 tablet by mouth  3 (three) times daily. 90 tablet 6   cyclobenzaprine  (FLEXERIL ) 10 MG tablet Take 10 mg by mouth 2 (two) times daily as needed.     fluticasone  (FLONASE ) 50 MCG/ACT nasal spray PLACE 2 SPRAYS INTO BOTH NOSTRILS DAILY AS NEEDED FOR ALLERGIES OR RHINITIS. 16 mL 0  gabapentin  (NEURONTIN ) 300 MG capsule Take 1 capsule by mouth 3 (three) times daily.     HYDROcodone  bit-homatropine (HYCODAN) 5-1.5 MG/5ML syrup Take 5 mLs by mouth every 6 (six) hours as needed for cough. 120 mL 0   hydrOXYzine  (ATARAX ) 50 MG tablet Take 1 tablet (50 mg total) by mouth 3 (three) times daily as needed for anxiety. 90 tablet 2   ipratropium (ATROVENT ) 0.03 % nasal spray 2 SPRAYS EACH NOSTRIL TWICE A DAY AS NEEDED FOR RUNNY NOSE/DRAINAGE DOWN THROAT. 30 mL 5   levocetirizine (XYZAL ) 5 MG tablet TAKE 1 TABLET EVERY DAY AS NEEDED 30 tablet 0   lidocaine  (LIDODERM ) 5 % Place 1 patch onto the skin every 12 (twelve) hours. Remove & Discard patch within 12 hours or as directed by MD 10 patch 0   losartan  (COZAAR ) 50 MG tablet Take 1 tablet (50 mg total) by mouth daily. 90 tablet 0   mepolizumab  (NUCALA ) 100 MG/ML SOSY Inject 100 mg into the skin every 28 (twenty-eight) days. 1 mL 11   montelukast  (SINGULAIR ) 10 MG tablet TAKE 1 TABLET BY MOUTH EVERYDAY AT BEDTIME 30 tablet 2   morphine (MSIR) 15 MG tablet Take 15 mg by mouth every 4 (four) hours as needed for severe pain.     pantoprazole  (PROTONIX ) 40 MG tablet TAKE 1 TABLET BY MOUTH EVERY DAY 30 tablet 2   predniSONE  (DELTASONE ) 10 MG tablet 6,5,4,3,2,1 take each days dose all at once with food 21 tablet 0   promethazine  (PHENERGAN ) 25 MG tablet Take 1 tablet (25 mg total) by mouth every 8 (eight) hours as needed for nausea or vomiting. 20 tablet 0   promethazine -dextromethorphan (PROMETHAZINE -DM) 6.25-15 MG/5ML syrup Take 5 mLs by mouth 4 (four) times daily as needed for cough. 118 mL 0   simvastatin  (ZOCOR ) 20 MG tablet TAKE 1 TABLET BY MOUTH EVERY DAY AT 6PM 90 tablet 1    SOMA  350 MG tablet Take 1 tablet (350 mg total) by mouth 3 (three) times daily as needed for muscle spasms. 15 tablet 0   TRELEGY ELLIPTA  200-62.5-25 MCG/ACT AEPB INHALE 1 PUFF BY MOUTH EVERY DAY 60 each 1   triamcinolone  (NASACORT ) 55 MCG/ACT AERO nasal inhaler      Vitamin D , Ergocalciferol , (DRISDOL ) 1.25 MG (50000 UNIT) CAPS capsule Take 1 capsule (50,000 Units total) by mouth every 7 (seven) days. 12 capsule 0   zonisamide  (ZONEGRAN ) 100 MG capsule Take 1 capsule (100 mg total) by mouth at bedtime. 30 capsule 2   Current Facility-Administered Medications  Medication Dose Route Frequency Provider Last Rate Last Admin   mepolizumab  (NUCALA ) injection 100 mg  100 mg Subcutaneous Q28 days Jeneal Danita Macintosh, MD   100 mg at 03/31/23 1523     Objective:  Psychiatric Specialty Exam: Physical Exam Constitutional:      Appearance: the patient is not toxic-appearing.  Pulmonary:     Effort: Pulmonary effort is normal.  Neurological:     General: No focal deficit present.     Mental Status: the patient is alert and oriented to person, place, and time.   Review of Systems  Respiratory:  Negative for shortness of breath.   Cardiovascular:  Negative for chest pain.  Gastrointestinal:  Negative for abdominal pain, constipation, diarrhea, nausea and vomiting.  Neurological:  Negative for headaches.      There were no vitals taken for this visit.  General Appearance: Fairly Groomed  Eye Contact:  Good  Speech:  Clear and Coherent  Volume:  Normal  Mood: depressed  Affect: appropriate, tearful, depressed  Thought Process:  Coherent  Orientation:  Full (Time, Place, and Person)  Thought Content: Logical   Suicidal Thoughts:  No  Homicidal Thoughts:  No  Memory:  Good  Judgement:  Good  Insight: Good  Psychomotor Activity:  Normal  Concentration:  Concentration: Good  Recall:  Good  Fund of Knowledge: Good  Language: Good  Akathisia:  No  Handed:    AIMS (if indicated):  not done  Assets:  Communication Skills Desire for Improvement Financial Resources/Insurance Housing Leisure Time Physical Health  ADL's:  Intact  Cognition: WNL  Sleep: Poor     Metabolic Disorder Labs: Lab Results  Component Value Date   HGBA1C 5.4 11/30/2023   No results found for: PROLACTIN Lab Results  Component Value Date   CHOL 217 (H) 11/30/2023   TRIG 68 11/30/2023   HDL 97 11/30/2023   CHOLHDL 2.2 11/30/2023   VLDL 77.7 06/22/2018   LDLCALC 108 (H) 11/30/2023   LDLCALC 70 09/30/2022   Lab Results  Component Value Date   TSH 1.110 11/30/2023   TSH 2.380 01/08/2023    Therapeutic Level Labs: No results found for: LITHIUM No results found for: VALPROATE No results found for: CBMZ  Screenings: GAD-7    Flowsheet Row [facility] from [facility] Office Visit from 07/21/2023 in [facility] Office Visit from 07/01/2023 in [facility] Office Visit from 01/08/2023 in [facility] at [facility] Office Visit from 07/15/2022 in [facility] at [facility]  Total GAD-7 Score 11 0 5 6 7    Mini-Mental    Flowsheet Row Office Visit from 04/17/2020 in [facility] at [facility]  Total Score (max 30 points ) 30   PHQ2-9    Flowsheet Row Office Visit from 10/14/2023 in [facility] Office Visit from 07/21/2023 in [facility] Office Visit from 07/01/2023 in [facility] at [facility] Office Visit from 03[facility]09/2023 in [facility] at [facility] Office Visit from 01/08/2023 in [facility] at [facility]  PHQ-2 Total Score 3 0 1 1 4   PHQ-9 Total Score 16 0 -- 4 9   Flowsheet Row ED from 12/01/2023 in [facility] Emergency Department at [facility] ED from 04/21/2023 in [facility] Emergency Department at [facility] UC from 11/03/2022 in  [facility])  C-SSRS RISK CATEGORY No Risk No Risk No Risk    Collaboration of Care: none  A total of 30 minutes was spent involved in face to face clinical care, chart review, documentation.   Marlo Masson, MD 12/22/2023, 2:44 PM

## 2023-12-21 ENCOUNTER — Telehealth (HOSPITAL_BASED_OUTPATIENT_CLINIC_OR_DEPARTMENT_OTHER): Admitting: Student in an Organized Health Care Education/Training Program

## 2023-12-21 DIAGNOSIS — F319 Bipolar disorder, unspecified: Secondary | ICD-10-CM | POA: Diagnosis not present

## 2023-12-21 DIAGNOSIS — F3170 Bipolar disorder, currently in remission, most recent episode unspecified: Secondary | ICD-10-CM

## 2023-12-21 DIAGNOSIS — F1220 Cannabis dependence, uncomplicated: Secondary | ICD-10-CM | POA: Diagnosis not present

## 2023-12-21 DIAGNOSIS — F5104 Psychophysiologic insomnia: Secondary | ICD-10-CM

## 2023-12-21 MED ORDER — ZONISAMIDE 100 MG PO CAPS: 100.0000 mg | ORAL_CAPSULE | Freq: Every day | ORAL | 2 refills | Status: DC

## 2023-12-21 MED ORDER — HYDROXYZINE HCL 50 MG PO TABS: 50.0000 mg | ORAL_TABLET | Freq: Three times a day (TID) | ORAL | 2 refills | Status: DC | PRN

## 2023-12-21 MED ORDER — BUPROPION HCL ER (XL) 300 MG PO TB24: 300.0000 mg | ORAL_TABLET | Freq: Every day | ORAL | 2 refills | Status: DC

## 2024-01-05 ENCOUNTER — Telehealth: Payer: Self-pay

## 2024-01-05 NOTE — Telephone Encounter (Signed)
 Copied from CRM [id number]. Topic: Clinical - Medication Question >> Jan 04, 2024 11:55 AM Delon T wrote: Reason for CRM: Patient thinks she has the new strain of covid, asking for prescription-  vomiting, feeling feverish, cough, headaches, body aches- [phone]

## 2024-01-05 NOTE — Telephone Encounter (Signed)
 Patient is going to [facility] tomorrow.

## 2024-01-08 ENCOUNTER — Other Ambulatory Visit: Payer: Self-pay | Admitting: Allergy

## 2024-01-08 DIAGNOSIS — J3089 Other allergic rhinitis: Secondary | ICD-10-CM

## 2024-01-21 ENCOUNTER — Ambulatory Visit: Admitting: Allergy

## 2024-01-21 ENCOUNTER — Other Ambulatory Visit: Payer: Self-pay

## 2024-01-21 VITALS — BP 138/82 | HR 69 | Temp 98.1°F | Ht 60.75 in | Wt 129.4 lb

## 2024-01-21 DIAGNOSIS — Z23 Encounter for immunization: Secondary | ICD-10-CM | POA: Diagnosis not present

## 2024-01-21 DIAGNOSIS — J329 Chronic sinusitis, unspecified: Secondary | ICD-10-CM

## 2024-01-21 DIAGNOSIS — J4550 Severe persistent asthma, uncomplicated: Secondary | ICD-10-CM

## 2024-01-21 DIAGNOSIS — K219 Gastro-esophageal reflux disease without esophagitis: Secondary | ICD-10-CM | POA: Diagnosis not present

## 2024-01-21 DIAGNOSIS — J3089 Other allergic rhinitis: Secondary | ICD-10-CM | POA: Diagnosis not present

## 2024-01-21 NOTE — Patient Instructions (Addendum)
 Allergic rhinitis with conjunctivitis (grass pollen, weed pollen, molds, and mouse) Continue Singulair  10 mg once a day Continue Xyzal  5 mg once a day as needed Continue Pataday  1 drop each eye once a day as needed for itchy watery eyes Continue ipratropium bromide  nasal spray using 2 sprays each nostril twice a day as needed for runny nose/drainage down throat Continue fluticasone  nasal spray 2 sprays each nostril once a day for stuffy nose. May use saline nasal rinse as needed for nasal symptoms. Use this prior to any medicated nasal spray.  Asthma  Continue Trelegy 1 puff daily Will consider Tezspire injections if needed to improve asthma control if needed but right now symptoms are doing well with maintenance inhaler Trelegy.   Continue Singulair  10 mg as above May use albuterol  2 puffs every 4 hours as needed for cough, wheeze, tightness in chest, or shortness of breath. Also, may use albuterol  2 puffs 5-15 minutes prior to exercise  Chronic rhinosinusitis  Strep pneumonia titers are better but still does not have great protection.  Now has 35% protective rate up from 22% previously.   Let see how you do with infections and if you still struggle with viral or bacterial illnesses then consider immunoglobulin replacement therapy.  Hopeful sinus symptoms will improve with Tezspire.   Reflux Use pantoprazole  daily for reflux control Continue dietary and lifestyle modifications as below  Follow-up in 4-6 months or sooner if needed

## 2024-01-21 NOTE — Progress Notes (Signed)
 Follow-up Note  RE: [name] MRN: [id number] DOB: 07/24/1965 Date of Office Visit: 01/21/2024   History of present illness: [name] is a 58 y.o. female presenting today for follow-up of asthma, allergic rhinosinusitis with conjunctivitis and reflux.  She was last seen in the office on 09/26/23 by myself.  Discussed the use of AI scribe software for clinical note transcription with the patient, who gave verbal consent to proceed.  She experienced a flu-like illness a few weeks ago, characterized by vomiting, headaches, fevers, and muscle aches. The illness lasted approximately two to three weeks, during which she increased her use of albuterol  to daily or multiple times a day. She has since recovered and her symptoms have resolved.  She has a history of respiratory issues and has been using a medical grade vaporizer for marijuana, which she feels has improved her lung function. An air quality test at her home identified various respiratory irritants, though no mold was found.  She has not received her nucala dario since April or May and has been managing her respiratory health with Trelegy and Singulair . She has not required prednisone  for her lungs since July, though she may have used it for a dog bite. She is focused on improving her home environment to reduce allergens, including frequent air filter changes due to having dogs.  She uses nasal sprays as needed and allergy eye drops when her eyes become itchy, particularly at night. Her breathing has improved significantly since making lifestyle changes, including reducing smoking and using edibles for body aches.  She is aware of her pollen allergies and notes that the recent frost has ended the pollen season, which has been beneficial for her symptoms.     Review of systems: 10pt ROS negative unless noted above in HPI   Past medical/social/surgical/family history have been reviewed and are unchanged unless specifically  indicated below.  No changes  Medication List: Current Outpatient Medications  Medication Sig Dispense Refill   albuterol  (VENTOLIN  HFA) 108 (90 Base) MCG/ACT inhaler TAKE 2 PUFFS BY MOUTH EVERY 4 TO 6 HOURS AS NEEDED 18 each 0   amLODipine  (NORVASC ) 10 MG tablet Take 1 tablet (10 mg total) by mouth daily. 90 tablet 0   aspirin 81 MG tablet Take 81 mg by mouth daily.     azelastine  (ASTELIN ) 0.1 % nasal spray Place 2 sprays into both nostrils 2 (two) times daily as needed for rhinitis. Use in each nostril as directed 30 mL 5   benzonatate  (TESSALON ) 100 MG capsule Take 1 capsule (100 mg total) by mouth 2 (two) times daily as needed for cough. 20 capsule 0   buPROPion  (WELLBUTRIN  XL) 300 MG 24 hr tablet Take 1 tablet (300 mg total) by mouth daily. 30 tablet 2   calcium -vitamin D  (OSCAL WITH D) 500-200 MG-UNIT tablet Take 1 tablet by mouth 3 (three) times daily. 90 tablet 6   fluticasone  (FLONASE ) 50 MCG/ACT nasal spray PLACE 2 SPRAYS INTO BOTH NOSTRILS DAILY AS NEEDED FOR ALLERGIES OR RHINITIS. 16 mL 0   gabapentin  (NEURONTIN ) 300 MG capsule Take 1 capsule by mouth 3 (three) times daily.     hydrOXYzine  (ATARAX ) 50 MG tablet Take 1 tablet (50 mg total) by mouth 3 (three) times daily as needed for anxiety. 90 tablet 2   ipratropium (ATROVENT ) 0.03 % nasal spray 2 SPRAYS EACH NOSTRIL TWICE A DAY AS NEEDED FOR RUNNY NOSE/DRAINAGE DOWN THROAT. 30 mL 5   levocetirizine (XYZAL ) 5 MG tablet TAKE 1  TABLET EVERY DAY AS NEEDED 30 tablet 0   lidocaine  (LIDODERM ) 5 % Place 1 patch onto the skin every 12 (twelve) hours. Remove & Discard patch within 12 hours or as directed by MD 10 patch 0   losartan  (COZAAR ) 50 MG tablet Take 1 tablet (50 mg total) by mouth daily. 90 tablet 0   mepolizumab  (NUCALA ) 100 MG/ML SOSY Inject 100 mg into the skin every 28 (twenty-eight) days. 1 mL 11   montelukast  (SINGULAIR ) 10 MG tablet TAKE 1 TABLET BY MOUTH EVERYDAY AT BEDTIME 30 tablet 2   morphine (MSIR) 15 MG tablet Take  15 mg by mouth every 4 (four) hours as needed for severe pain.     mupirocin ointment (BACTROBAN) 2 % Apply 1 Application topically daily.     pantoprazole  (PROTONIX ) 40 MG tablet TAKE 1 TABLET BY MOUTH EVERY DAY 30 tablet 2   promethazine  (PHENERGAN ) 25 MG tablet Take 1 tablet (25 mg total) by mouth every 8 (eight) hours as needed for nausea or vomiting. 20 tablet 0   promethazine -dextromethorphan (PROMETHAZINE -DM) 6.25-15 MG/5ML syrup Take 5 mLs by mouth 4 (four) times daily as needed for cough. 118 mL 0   simvastatin  (ZOCOR ) 20 MG tablet TAKE 1 TABLET BY MOUTH EVERY DAY AT 6PM 90 tablet 1   SOMA  350 MG tablet Take 1 tablet (350 mg total) by mouth 3 (three) times daily as needed for muscle spasms. 15 tablet 0   TRELEGY ELLIPTA  200-62.5-25 MCG/ACT AEPB INHALE 1 PUFF BY MOUTH EVERY DAY 60 each 1   triamcinolone  (NASACORT ) 55 MCG/ACT AERO nasal inhaler      Vitamin D , Ergocalciferol , (DRISDOL ) 1.25 MG (50000 UNIT) CAPS capsule Take 1 capsule (50,000 Units total) by mouth every 7 (seven) days. 12 capsule 0   zonisamide  (ZONEGRAN ) 100 MG capsule Take 1 capsule (100 mg total) by mouth at bedtime. 30 capsule 2   azithromycin  (ZITHROMAX ) 250 MG tablet Take 2 tabs today, then take 1 tab daily until gone. (Patient not taking: Reported on 01/21/2024) 6 tablet 0   Calcium  Carb-Cholecalciferol (OYSTER SHELL CALCIUM  W/D) 500-5 MG-MCG TABS TAKE 1 TABLET BY MOUTH THREE TIMES A DAY (Patient not taking: Reported on 01/21/2024) 90 tablet 6   cyclobenzaprine  (FLEXERIL ) 10 MG tablet Take 10 mg by mouth 2 (two) times daily as needed. (Patient not taking: Reported on 01/21/2024)     HYDROcodone  bit-homatropine (HYCODAN) 5-1.5 MG/5ML syrup Take 5 mLs by mouth every 6 (six) hours as needed for cough. (Patient not taking: Reported on 01/21/2024) 120 mL 0   predniSONE  (DELTASONE ) 10 MG tablet 6,5,4,3,2,1 take each days dose all at once with food (Patient not taking: Reported on 01/21/2024) 21 tablet 0   Current  Facility-Administered Medications  Medication Dose Route Frequency Provider Last Rate Last Admin   mepolizumab  (NUCALA ) injection 100 mg  100 mg Subcutaneous Q28 days Jeneal Danita Macintosh, MD   100 mg at 03/31/23 1523     Known medication allergies: Allergies  Allergen Reactions   Other    Effexor [Venlafaxine] Other (See Comments)    UNSPECIFIED REACTION, headaches, felt funny, withdrawal with missed dose    Lamotrigine Rash and Other (See Comments)     Physical examination: Blood pressure 138/82, pulse 69, temperature 98.1 F (36.7 C), temperature source Temporal, height 5' 0.75 (1.543 m), weight 129 lb 6.4 oz (58.7 kg), SpO2 99%.  General: Alert, interactive, in no acute distress. HEENT: PERRLA, TMs pearly gray, turbinates non-edematous without discharge, post-pharynx non erythematous. Neck: Supple without  lymphadenopathy. Lungs: Clear to auscultation without wheezing, rhonchi or rales. {no increased work of breathing. CV: Normal S1, S2 without murmurs. Abdomen: Nondistended, nontender. Skin: Warm and dry, without lesions or rashes. Extremities:  No clubbing, cyanosis or edema. Neuro:   Grossly intact.  Diagnostics/Labs:  Spirometry: FEV1: 2.74L 129%, FVC: 3.33 125%, ratio consistent with nonobstructive pattern  Fluzone flu shot provided in clinic visit  Assessment and plan:   Allergic rhinitis with conjunctivitis (grass pollen, weed pollen, molds, and mouse) Continue Singulair  10 mg once a day Continue Xyzal  5 mg once a day as needed Continue Pataday  1 drop each eye once a day as needed for itchy watery eyes Continue ipratropium bromide  nasal spray using 2 sprays each nostril twice a day as needed for runny nose/drainage down throat Continue fluticasone  nasal spray 2 sprays each nostril once a day for stuffy nose. May use saline nasal rinse as needed for nasal symptoms. Use this prior to any medicated nasal spray.  Asthma  Continue Trelegy 1 puff daily Will  consider Tezspire injections if needed to improve asthma control if needed but right now symptoms are doing well with maintenance inhaler Trelegy.   Continue Singulair  10 mg as above May use albuterol  2 puffs every 4 hours as needed for cough, wheeze, tightness in chest, or shortness of breath. Also, may use albuterol  2 puffs 5-15 minutes prior to exercise  Chronic rhinosinusitis  Strep pneumonia titers are better but still does not have great protection.  Now has 35% protective rate up from 22% previously.   Let see how you do with infections and if you still struggle with viral or bacterial illnesses then consider immunoglobulin replacement therapy.  Hopeful sinus symptoms will improve with Tezspire.   Reflux Use pantoprazole  daily for reflux control Continue dietary and lifestyle modifications as below  Follow-up in 4-6 months or sooner if needed I appreciate the opportunity to take part in [name]'s care. Please do not hesitate to contact me with questions.  Sincerely,   Danita Brain, MD Allergy/Immunology [facility]

## 2024-01-22 ENCOUNTER — Encounter: Payer: Self-pay | Admitting: Allergy

## 2024-01-22 MED ORDER — TRELEGY ELLIPTA 200-62.5-25 MCG/ACT IN AEPB: 1.0000 | INHALATION_SPRAY | Freq: Every day | RESPIRATORY_TRACT | 5 refills | Status: AC

## 2024-01-22 MED ORDER — FLUTICASONE PROPIONATE 50 MCG/ACT NA SUSP: NASAL | 5 refills | Status: AC

## 2024-01-22 MED ORDER — MONTELUKAST SODIUM 10 MG PO TABS: ORAL_TABLET | ORAL | 5 refills | Status: AC

## 2024-01-22 MED ORDER — LEVOCETIRIZINE DIHYDROCHLORIDE 5 MG PO TABS: ORAL_TABLET | ORAL | 5 refills | Status: AC

## 2024-01-22 MED ORDER — OLOPATADINE HCL 0.2 % OP SOLN: OPHTHALMIC | 5 refills | Status: AC

## 2024-01-22 MED ORDER — PANTOPRAZOLE SODIUM 40 MG PO TBEC: 40.0000 mg | DELAYED_RELEASE_TABLET | Freq: Every day | ORAL | 5 refills | Status: AC

## 2024-01-22 MED ORDER — IPRATROPIUM BROMIDE 0.03 % NA SOLN: NASAL | 5 refills | Status: AC

## 2024-01-22 MED ORDER — ALBUTEROL SULFATE HFA 108 (90 BASE) MCG/ACT IN AERS: INHALATION_SPRAY | RESPIRATORY_TRACT | 1 refills | Status: DC

## 2024-01-25 ENCOUNTER — Other Ambulatory Visit: Payer: Self-pay | Admitting: Family Medicine

## 2024-01-25 DIAGNOSIS — Z85828 Personal history of other malignant neoplasm of skin: Secondary | ICD-10-CM | POA: Diagnosis not present

## 2024-01-25 DIAGNOSIS — Z08 Encounter for follow-up examination after completed treatment for malignant neoplasm: Secondary | ICD-10-CM | POA: Diagnosis not present

## 2024-01-27 ENCOUNTER — Other Ambulatory Visit: Payer: Self-pay | Admitting: Family Medicine

## 2024-01-31 NOTE — Progress Notes (Deleted)
 BH MD Outpatient Progress Note  01/31/2024 10:03 PM [name]  MRN:  [id number]  Assessment:  [name] presents for follow-up evaluation on 01/31/24      *** Patient currently tolerating and benefiting from current psychotropic regimen. On a positive note she has been increasing participating in activities she enjoys. And has recently started seeing a therapist.  No significant mood symptoms reported at this visit. Appropriate to current medications at their current dose. No acute safety concerns identified in this visit.  Identifying Information: [name] is a 58 y.o. y.o. 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   Plan:  # Bipolar affective disorder, current episode depressed # R/o prolonged grief disorder -- ***Continue Wellbutrin  300 mg XL daily - ***Continue zonisamide  100 mg nightly - ***Continue atarax  50 mg TID PRN for anxiety and sleep   # Cannabis use disorder, moderate, dependence Interventions: -- ***Continue to encourage reduction use/abstinence  Patient was given contact information for behavioral health clinic and was instructed to call 911 for emergencies.    Health Maintenance: Chronic pain - per PDMP (reviewed 12/22/2023) currently prescribed Carisoprodol  and Morphine monthly by another provider  Subjective:  Chief Complaint:  No chief complaint on file.   Interval History:   *** Depression: Anxiety: AEs to medications: Medication compliance (missing doses, taking as directed):  Sleep: Appetite: Caffeine: Recent substance use: Alcohol - Tobacco -  Cannabis - Other - SI: HI: AVH:   *** Patient reports starting therapy this week with an NP provider,  [name], and describes the initial session as going well. She reports ongoing symptoms of depression, which she characterizes as ups and downs, but notes that she has been making efforts to socialize. She finds Wellbutrin  helpful for her mood. She reports using hydroxyzine  three times a day for anxiety and finds it beneficial. She denies adverse effects to medications and states she is taking her medications as directed. She reports that her sleep has been improving and her appetite remains unchanged. She denies suicidal ideation, homicidal ideation, and auditory/visual hallucinations.   Visit Diagnosis:  No diagnosis found.    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  The patient reports that she has not attempted suicide in several decades  Past Medical History:  Past Medical History:  Diagnosis Date   Anxiety    Arthritis    neck, knees, shoulders   Asthma    Bipolar disorder (HCC)    currently feeling MANIC- 10/02/2016   Depression    GERD (gastroesophageal reflux disease)    Hip fracture (HCC) 06/2019   History of blood transfusion    as a newborn    History of lump of left breast    Hyperlipidemia    Lumbar pseudoarthrosis    Motion sickness    cars   OSA (obstructive sleep apnea) 01/09/2016   can't afford CPAP   Personality disorder (HCC)    PONV (postoperative nausea and vomiting)    Post traumatic stress disorder (PTSD)    Substance abuse (HCC)    Synovial cyst     Past Surgical History:  Procedure Laterality Date   COLONOSCOPY WITH PROPOFOL  N/A 11/04/2017   Procedure: COLONOSCOPY WITH PROPOFOL ;  Surgeon: Unk Corinn Skiff, MD;  Location: [facility];  Service: Endoscopy;  Laterality: N/A;   ESOPHAGOGASTRODUODENOSCOPY (EGD) WITH PROPOFOL  N/A 11/04/2017   Procedure:  ESOPHAGOGASTRODUODENOSCOPY (EGD) WITH PROPOFOL  with biopsies;  Surgeon: Unk Corinn Skiff, MD;  Location: [facility];  Service: Endoscopy;  Laterality: N/A;  sleep apnea   KNEE SURGERY Left    x5, post basketball injury   LUMBAR LAMINECTOMY/DECOMPRESSION MICRODISCECTOMY Left 01/22/2016   Procedure: Laminectomy for facet/synovial cyst - left - Lumbar four - lumbar five;  Surgeon: Victory Gunnels, MD;  Location: [facility] OR;  Service: Neurosurgery;  Laterality: Left;  Laminectomy for facet/synovial cyst - left - Lumbar four - lumbar five   POLYPECTOMY N/A 11/04/2017   Procedure: POLYPECTOMY INTESTINAL;  Surgeon: Unk Corinn Skiff, MD;  Location: [facility];  Service: Endoscopy;  Laterality: N/A;   SHOULDER SURGERY Left    x2   SPINE SURGERY N/A    Phreesia 08/07/2019   TOE SURGERY Bilateral    bone spurs    Family Psychiatric History: None pertinent  Family History:  Family History  Problem Relation Age of Onset   Heart disease Father    Hyperlipidemia Father    Alcohol abuse Brother    Alcohol abuse Paternal Uncle    Breast cancer Maternal Aunt        70's   Colon cancer Neg Hx     Social History:  Social History   Socioeconomic History   Marital status: Single    Spouse name: Not on file   Number of children: 0   Years of education: Not on file   Highest education level: Bachelor's degree (e.g., BA, AB, BS)  Occupational History   Not on file  Tobacco Use   Smoking status: Former    Current packs/day: 0.00    Average packs/day: 1 pack/day for 1 year (1.0 ttl pk-yrs)    Types: Cigarettes    Start date: 04/16/1995    Quit date: 04/15/1996    Years since quitting: 27.8    Passive exposure: Past   Smokeless tobacco: Never   Tobacco comments:    started back again in 2016, smoked for about 6 mo and then quit  Vaping Use   Vaping status: Never Used  Substance and Sexual Activity   Alcohol use: No    Alcohol/week: 0.0 standard drinks of alcohol    Comment:  quit 20 years   Drug use: Yes    Frequency: 14.0 times per week    Types: Marijuana    Comment: 20 yrs. ago- cocaine    Sexual activity: Not Currently  Other Topics Concern   Not on file  Social History Narrative   Not on file   Social Drivers of Health   Financial Resource Strain: Medium Risk (11/30/2023)   Overall Financial Resource Strain (CARDIA)    Difficulty of Paying Living Expenses: Somewhat hard  Food Insecurity: Food Insecurity Present (11/30/2023)   Hunger Vital Sign    Worried About Running Out of Food in the Last Year: Sometimes true    Ran Out of Food in the Last Year: Sometimes true  Transportation Needs: No Transportation Needs (11/30/2023)   PRAPARE - Administrator, Civil Service (Medical): No    Lack of Transportation (Non-Medical): No  Physical Activity: Insufficiently Active (11/30/2023)   Exercise Vital Sign    Days of Exercise per Week: 3  days    Minutes of Exercise per Session: 20 min  Stress: Stress Concern Present (11/30/2023)   [employer] of Occupational Health - Occupational Stress Questionnaire    Feeling of Stress: Rather much  Social Connections: Socially Isolated (11/30/2023)   Social Connection and Isolation Panel    Frequency of Communication with Friends and Family: More than three times a week    Frequency of Social Gatherings with Friends and Family: More than three times a week    Attends Religious Services: Never    Database Administrator or Organizations: No    Attends Engineer, Structural: Not on file    Marital Status: Never married    Allergies:  Allergies  Allergen Reactions   Other    Effexor [Venlafaxine] Other (See Comments)    UNSPECIFIED REACTION, headaches, felt funny, withdrawal with missed dose    Lamotrigine Rash and Other (See Comments)    Current Medications: Current Outpatient Medications  Medication Sig Dispense Refill   albuterol  (VENTOLIN  HFA) 108 (90 Base) MCG/ACT inhaler TAKE 2 PUFFS  BY MOUTH EVERY 4 TO 6 HOURS AS NEEDED 18 g 1   amLODipine  (NORVASC ) 10 MG tablet TAKE 1 TABLET BY MOUTH EVERY DAY 90 tablet 0   aspirin 81 MG tablet Take 81 mg by mouth daily.     azelastine  (ASTELIN ) 0.1 % nasal spray Place 2 sprays into both nostrils 2 (two) times daily as needed for rhinitis. Use in each nostril as directed 30 mL 5   azithromycin  (ZITHROMAX ) 250 MG tablet Take 2 tabs today, then take 1 tab daily until gone. (Patient not taking: Reported on 01/21/2024) 6 tablet 0   benzonatate  (TESSALON ) 100 MG capsule Take 1 capsule (100 mg total) by mouth 2 (two) times daily as needed for cough. 20 capsule 0   buPROPion  (WELLBUTRIN  XL) 300 MG 24 hr tablet Take 1 tablet (300 mg total) by mouth daily. 30 tablet 2   Calcium  Carb-Cholecalciferol (OYSTER SHELL CALCIUM  W/D) 500-5 MG-MCG TABS TAKE 1 TABLET BY MOUTH THREE TIMES A DAY (Patient not taking: Reported on 01/21/2024) 90 tablet 6   calcium -vitamin D  (OSCAL WITH D) 500-200 MG-UNIT tablet Take 1 tablet by mouth 3 (three) times daily. 90 tablet 6   cyclobenzaprine  (FLEXERIL ) 10 MG tablet Take 10 mg by mouth 2 (two) times daily as needed. (Patient not taking: Reported on 01/21/2024)     fluticasone  (FLONASE ) 50 MCG/ACT nasal spray 2 sprays each nostril once a day for stuffy nose 16 mL 5   Fluticasone -Umeclidin-Vilant (TRELEGY ELLIPTA ) 200-62.5-25 MCG/ACT AEPB Inhale 1 puff into the lungs daily. 60 each 5   gabapentin  (NEURONTIN ) 300 MG capsule Take 1 capsule by mouth 3 (three) times daily.     HYDROcodone  bit-homatropine (HYCODAN) 5-1.5 MG/5ML syrup Take 5 mLs by mouth every 6 (six) hours as needed for cough. (Patient not taking: Reported on 01/21/2024) 120 mL 0   hydrOXYzine  (ATARAX ) 50 MG tablet Take 1 tablet (50 mg total) by mouth 3 (three) times daily as needed for anxiety. 90 tablet 2   ipratropium (ATROVENT ) 0.03 % nasal spray 2 sprays each nostril twice a day as needed for runny nose/drainage down throat. 30 mL 5   levocetirizine (XYZAL ) 5  MG tablet TAKE 1 TABLET EVERY DAY AS NEEDED 30 tablet 5   lidocaine  (LIDODERM ) 5 % Place 1 patch onto the skin every 12 (twelve) hours. Remove & Discard patch within 12 hours or as directed by MD 10 patch 0  losartan  (COZAAR ) 50 MG tablet TAKE 1 TABLET BY MOUTH EVERY DAY 90 tablet 0   mepolizumab  (NUCALA ) 100 MG/ML SOSY Inject 100 mg into the skin every 28 (twenty-eight) days. 1 mL 11   montelukast  (SINGULAIR ) 10 MG tablet TAKE 1 TABLET BY MOUTH EVERYDAY AT BEDTIME 30 tablet 5   morphine (MSIR) 15 MG tablet Take 15 mg by mouth every 4 (four) hours as needed for severe pain.     mupirocin ointment (BACTROBAN) 2 % Apply 1 Application topically daily.     Olopatadine  HCl (PATADAY ) 0.2 % SOLN One drop each eye daily as needed for itchy, water eyes. 3.5 mL 5   pantoprazole  (PROTONIX ) 40 MG tablet Take 1 tablet (40 mg total) by mouth daily. 30 tablet 5   predniSONE  (DELTASONE ) 10 MG tablet 6,5,4,3,2,1 take each days dose all at once with food (Patient not taking: Reported on 01/21/2024) 21 tablet 0   promethazine  (PHENERGAN ) 25 MG tablet Take 1 tablet (25 mg total) by mouth every 8 (eight) hours as needed for nausea or vomiting. 20 tablet 0   promethazine -dextromethorphan (PROMETHAZINE -DM) 6.25-15 MG/5ML syrup Take 5 mLs by mouth 4 (four) times daily as needed for cough. 118 mL 0   simvastatin  (ZOCOR ) 20 MG tablet TAKE 1 TABLET BY MOUTH EVERY DAY AT 6PM 90 tablet 1   SOMA  350 MG tablet Take 1 tablet (350 mg total) by mouth 3 (three) times daily as needed for muscle spasms. 15 tablet 0   triamcinolone  (NASACORT ) 55 MCG/ACT AERO nasal inhaler      Vitamin D , Ergocalciferol , (DRISDOL ) 1.25 MG (50000 UNIT) CAPS capsule Take 1 capsule (50,000 Units total) by mouth every 7 (seven) days. 12 capsule 0   zonisamide  (ZONEGRAN ) 100 MG capsule Take 1 capsule (100 mg total) by mouth at bedtime. 30 capsule 2   Current Facility-Administered Medications  Medication Dose Route Frequency Provider Last Rate Last Admin    mepolizumab  (NUCALA ) injection 100 mg  100 mg Subcutaneous Q28 days Jeneal Danita Macintosh, MD   100 mg at 03/31/23 1523     Objective:  Psychiatric Specialty Exam: Physical Exam Constitutional:      Appearance: the patient is not toxic-appearing.  Pulmonary:     Effort: Pulmonary effort is normal.  Neurological:     General: No focal deficit present.     Mental Status: the patient is alert and oriented to person, place, and time.   Review of Systems  Respiratory:  Negative for shortness of breath.   Cardiovascular:  Negative for chest pain.  Gastrointestinal:  Negative for abdominal pain, constipation, diarrhea, nausea and vomiting.  Neurological:  Negative for headaches.      There were no vitals taken for this visit.  General Appearance: Fairly Groomed  Eye Contact:  Good  Speech:  Clear and Coherent  Volume:  Normal  Mood: depressed  Affect: appropriate, tearful, depressed  Thought Process:  Coherent  Orientation:  Full (Time, Place, and Person)  Thought Content: Logical   Suicidal Thoughts:  No  Homicidal Thoughts:  No  Memory:  Good  Judgement:  Good***  Insight: Good***  Psychomotor Activity:  Normal***  Concentration:  Concentration: Good  Recall:  Good  Fund of Knowledge: Good  Language: Good  Akathisia:  No  Handed:    AIMS (if indicated): not done  Assets:  Communication Skills Desire for Improvement Financial Resources/Insurance Housing Leisure Time Physical Health  ADL's:  Intact  Cognition: WNL  Sleep: Poor     Metabolic Disorder  Labs: Lab Results  Component Value Date   HGBA1C 5.4 11/30/2023   No results found for: PROLACTIN Lab Results  Component Value Date   CHOL 217 (H) 11/30/2023   TRIG 68 11/30/2023   HDL 97 11/30/2023   CHOLHDL 2.2 11/30/2023   VLDL 77.7 06/22/2018   LDLCALC 108 (H) 11/30/2023   LDLCALC 70 09/30/2022   Lab Results  Component Value Date   TSH 1.110 11/30/2023   TSH 2.380 01/08/2023     Therapeutic Level Labs: No results found for: LITHIUM No results found for: VALPROATE No results found for: CBMZ  Screenings: GAD-7    Flowsheet Row [facility] Office Visit from 07/21/2023 in [facility] Office Visit from 07/01/2023 in [facility] Office Visit from 01/08/2023 in [facility] Office Visit from 07/15/2022 in [facility] at [facility]  Total GAD-7 Score 11 0 5 6 7    Mini-Mental    Flowsheet Row Office Visit from 04/17/2020 in [facility] at [facility]  Total Score (max 30 points ) 30   PHQ2-9    Flowsheet Row Office Visit from 10/14/2023 in [facility] Office Visit from 07/21/2023 in [place] [facility] Office Visit from 07/01/2023 in [facility] at [facility] Office Visit from 03/09/2023 in [facility] at [facility] Office Visit from 01/08/2023 in [facility] at [facility]  PHQ-2 Total Score 3 0 1 1 4   PHQ-9 Total Score 16 0 -- 4 9   Flowsheet Row ED from 12/01/2023 in [facility] Department at [facility] ED from 04/21/2023 in [facility] Emergency Department at [facility] UC from 11/03/2022 in [facility])  C-SSRS RISK CATEGORY No Risk No Risk No Risk    Collaboration of Care: none  A total of 30 minutes was spent involved in face to face clinical care, chart review, documentation.   Marlo Masson, MD 01/31/2024, 10:03 PM

## 2024-02-01 ENCOUNTER — Ambulatory Visit (HOSPITAL_COMMUNITY): Admitting: Student in an Organized Health Care Education/Training Program

## 2024-02-08 ENCOUNTER — Ambulatory Visit (HOSPITAL_COMMUNITY): Admitting: Student in an Organized Health Care Education/Training Program

## 2024-02-09 ENCOUNTER — Other Ambulatory Visit: Payer: Self-pay | Admitting: Pharmacist

## 2024-02-09 MED ORDER — LOSARTAN POTASSIUM 50 MG PO TABS: 50.0000 mg | ORAL_TABLET | Freq: Every day | ORAL | 0 refills | Status: AC

## 2024-02-09 NOTE — Progress Notes (Signed)
 Pharmacy Quality Measure Review  This patient is appearing on a report for the adherence measure for hypertension (ACEi/ARB) medications this calendar year.   Medication: losartan  Last fill date: 10/27/23 for 90 day supply  Contacted pharmacy to facilitate refills. When I called the pharmacy, they reported that the medication was discontinued on their end. I checked PCP's note and found that losartan  refill was sent 01/26/24 but pharmacy did not receive. New rxn sent. I will set a reminder to check to make sure they received this later this week.   Herlene Fleeta Morris, PharmD, JAQUELINE, CPP Clinical Pharmacist [facility] [phone]

## 2024-02-12 ENCOUNTER — Other Ambulatory Visit: Payer: Self-pay | Admitting: Pharmacist

## 2024-02-12 NOTE — Progress Notes (Signed)
 Pharmacy Quality Measure Review  This patient is appearing on a report for the adherence measure for hypertension (ACEi/ARB) medications this calendar year.   Medication: losartan  Last fill date: 10/27/23 for 90 day supply  Contacted pharmacy to facilitate refills. When I called the pharmacy, they reported that the medication was discontinued on their end. I checked PCP's note and found that losartan  refill was sent 01/26/24 but pharmacy did not receive. New rxn sent. I will set a reminder to check to make sure they received this later this week.   Herlene Fleeta Morris, PharmD, JAQUELINE, CPP Clinical Pharmacist [facility] [phone]
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
## 2024-03-14 ENCOUNTER — Ambulatory Visit (HOSPITAL_COMMUNITY): Admitting: Student in an Organized Health Care Education/Training Program

## 2024-03-14 DIAGNOSIS — F5104 Psychophysiologic insomnia: Secondary | ICD-10-CM

## 2024-03-14 DIAGNOSIS — F3170 Bipolar disorder, currently in remission, most recent episode unspecified: Secondary | ICD-10-CM

## 2024-03-14 MED ORDER — ZONISAMIDE 100 MG PO CAPS: 100.0000 mg | ORAL_CAPSULE | Freq: Every day | ORAL | 2 refills | Status: AC

## 2024-03-14 MED ORDER — HYDROXYZINE HCL 50 MG PO TABS: 50.0000 mg | ORAL_TABLET | Freq: Three times a day (TID) | ORAL | 2 refills | Status: AC | PRN

## 2024-03-14 MED ORDER — BUPROPION HCL ER (XL) 300 MG PO TB24: 300.0000 mg | ORAL_TABLET | Freq: Every day | ORAL | 2 refills | Status: AC

## 2024-03-19 ENCOUNTER — Other Ambulatory Visit: Payer: Self-pay | Admitting: Allergy

## 2024-04-18 ENCOUNTER — Ambulatory Visit (HOSPITAL_COMMUNITY): Admitting: Student in an Organized Health Care Education/Training Program

## 2024-07-21 ENCOUNTER — Ambulatory Visit: Admitting: Allergy
